# Patient Record
Sex: Male | Born: 1945 | Race: White | Hispanic: No | Smoking: Former smoker
Health system: Southern US, Community
[De-identification: ages and names within clinical notes are randomized; demographics above are authoritative.]

## PROBLEM LIST (undated history)

## (undated) DIAGNOSIS — I214 Non-ST elevation (NSTEMI) myocardial infarction: Secondary | ICD-10-CM

## (undated) DIAGNOSIS — M1990 Unspecified osteoarthritis, unspecified site: Secondary | ICD-10-CM

## (undated) DIAGNOSIS — Z95820 Peripheral vascular angioplasty status with implants and grafts: Secondary | ICD-10-CM

## (undated) DIAGNOSIS — G4730 Sleep apnea, unspecified: Secondary | ICD-10-CM

## (undated) DIAGNOSIS — G8929 Other chronic pain: Secondary | ICD-10-CM

## (undated) DIAGNOSIS — I739 Peripheral vascular disease, unspecified: Secondary | ICD-10-CM

## (undated) DIAGNOSIS — E785 Hyperlipidemia, unspecified: Secondary | ICD-10-CM

## (undated) DIAGNOSIS — I350 Nonrheumatic aortic (valve) stenosis: Secondary | ICD-10-CM

## (undated) DIAGNOSIS — F1021 Alcohol dependence, in remission: Secondary | ICD-10-CM

## (undated) DIAGNOSIS — M549 Dorsalgia, unspecified: Secondary | ICD-10-CM

## (undated) DIAGNOSIS — I519 Heart disease, unspecified: Secondary | ICD-10-CM

## (undated) DIAGNOSIS — F419 Anxiety disorder, unspecified: Secondary | ICD-10-CM

## (undated) DIAGNOSIS — B1910 Unspecified viral hepatitis B without hepatic coma: Secondary | ICD-10-CM

## (undated) DIAGNOSIS — H353130 Nonexudative age-related macular degeneration, bilateral, stage unspecified: Secondary | ICD-10-CM

## (undated) DIAGNOSIS — J189 Pneumonia, unspecified organism: Secondary | ICD-10-CM

## (undated) DIAGNOSIS — K219 Gastro-esophageal reflux disease without esophagitis: Secondary | ICD-10-CM

## (undated) DIAGNOSIS — I82409 Acute embolism and thrombosis of unspecified deep veins of unspecified lower extremity: Secondary | ICD-10-CM

## (undated) DIAGNOSIS — E1142 Type 2 diabetes mellitus with diabetic polyneuropathy: Secondary | ICD-10-CM

## (undated) DIAGNOSIS — I2510 Atherosclerotic heart disease of native coronary artery without angina pectoris: Secondary | ICD-10-CM

## (undated) DIAGNOSIS — J329 Chronic sinusitis, unspecified: Secondary | ICD-10-CM

## (undated) DIAGNOSIS — I209 Angina pectoris, unspecified: Secondary | ICD-10-CM

## (undated) DIAGNOSIS — I10 Essential (primary) hypertension: Secondary | ICD-10-CM

## (undated) DIAGNOSIS — I509 Heart failure, unspecified: Secondary | ICD-10-CM

## (undated) DIAGNOSIS — Z8711 Personal history of peptic ulcer disease: Secondary | ICD-10-CM

## (undated) DIAGNOSIS — Z8719 Personal history of other diseases of the digestive system: Secondary | ICD-10-CM

## (undated) DIAGNOSIS — I429 Cardiomyopathy, unspecified: Secondary | ICD-10-CM

## (undated) DIAGNOSIS — E119 Type 2 diabetes mellitus without complications: Secondary | ICD-10-CM

## (undated) HISTORY — PX: CORONARY ANGIOPLASTY WITH STENT PLACEMENT: SHX49

## (undated) HISTORY — PX: COLONOSCOPY W/ BIOPSIES AND POLYPECTOMY: SHX1376

## (undated) HISTORY — PX: JOINT REPLACEMENT: SHX530

## (undated) HISTORY — PX: TONSILLECTOMY: SUR1361

## (undated) HISTORY — PX: ANTERIOR CERVICAL DECOMP/DISCECTOMY FUSION: SHX1161

## (undated) HISTORY — PX: REVISION TOTAL HIP ARTHROPLASTY: SHX766

## (undated) HISTORY — PX: CAROTID ENDARTERECTOMY: SUR193

## (undated) HISTORY — PX: TOTAL HIP ARTHROPLASTY: SHX124

## (undated) HISTORY — PX: BACK SURGERY: SHX140

---

## 1998-07-03 ENCOUNTER — Encounter: Payer: Self-pay | Admitting: Internal Medicine

## 1998-07-03 ENCOUNTER — Ambulatory Visit (HOSPITAL_COMMUNITY): Admission: RE | Admit: 1998-07-03 | Discharge: 1998-07-03 | Payer: Self-pay | Admitting: Internal Medicine

## 1999-05-28 ENCOUNTER — Ambulatory Visit (HOSPITAL_COMMUNITY): Admission: RE | Admit: 1999-05-28 | Discharge: 1999-05-28 | Payer: Self-pay | Admitting: Internal Medicine

## 1999-05-28 ENCOUNTER — Encounter: Payer: Self-pay | Admitting: Internal Medicine

## 2000-07-28 ENCOUNTER — Encounter: Payer: Self-pay | Admitting: Internal Medicine

## 2000-07-28 ENCOUNTER — Ambulatory Visit (HOSPITAL_COMMUNITY): Admission: RE | Admit: 2000-07-28 | Discharge: 2000-07-28 | Payer: Self-pay | Admitting: Internal Medicine

## 2001-09-12 ENCOUNTER — Encounter: Payer: Self-pay | Admitting: Internal Medicine

## 2001-09-12 ENCOUNTER — Ambulatory Visit (HOSPITAL_COMMUNITY): Admission: RE | Admit: 2001-09-12 | Discharge: 2001-09-12 | Payer: Self-pay | Admitting: Internal Medicine

## 2002-11-21 ENCOUNTER — Ambulatory Visit (HOSPITAL_COMMUNITY): Admission: RE | Admit: 2002-11-21 | Discharge: 2002-11-21 | Payer: Self-pay | Admitting: Internal Medicine

## 2002-11-21 ENCOUNTER — Encounter: Payer: Self-pay | Admitting: Internal Medicine

## 2003-07-12 ENCOUNTER — Ambulatory Visit (HOSPITAL_COMMUNITY): Admission: RE | Admit: 2003-07-12 | Discharge: 2003-07-12 | Payer: Self-pay | Admitting: Internal Medicine

## 2003-11-25 ENCOUNTER — Ambulatory Visit (HOSPITAL_COMMUNITY): Admission: RE | Admit: 2003-11-25 | Discharge: 2003-11-25 | Payer: Self-pay | Admitting: Internal Medicine

## 2005-03-09 ENCOUNTER — Encounter: Admission: RE | Admit: 2005-03-09 | Discharge: 2005-04-25 | Payer: Self-pay | Admitting: Internal Medicine

## 2010-10-19 ENCOUNTER — Ambulatory Visit: Payer: Self-pay | Admitting: Internal Medicine

## 2012-03-26 HISTORY — PX: CARDIAC CATHETERIZATION: SHX172

## 2012-04-10 HISTORY — PX: CORONARY ARTERY BYPASS GRAFT: SHX141

## 2012-07-13 ENCOUNTER — Encounter (HOSPITAL_COMMUNITY)
Admission: RE | Admit: 2012-07-13 | Discharge: 2012-07-13 | Disposition: A | Discharge status: Home / Self Care | Payer: Non-veteran care | Source: Ambulatory Visit | Attending: Cardiology | Admitting: Cardiology

## 2012-07-13 DIAGNOSIS — Z5189 Encounter for other specified aftercare: Secondary | ICD-10-CM | POA: Insufficient documentation

## 2012-07-13 DIAGNOSIS — Z951 Presence of aortocoronary bypass graft: Secondary | ICD-10-CM | POA: Insufficient documentation

## 2012-07-13 DIAGNOSIS — I10 Essential (primary) hypertension: Secondary | ICD-10-CM | POA: Insufficient documentation

## 2012-07-13 DIAGNOSIS — I2510 Atherosclerotic heart disease of native coronary artery without angina pectoris: Secondary | ICD-10-CM | POA: Insufficient documentation

## 2012-07-13 NOTE — Progress Notes (Signed)
Cardiac Rehab Medication Review by a Pharmacist  Does the patient  feel that his/her medications are working for him/her?  yes  Has the patient been experiencing any side effects to the medications prescribed?  no  Does the patient measure his/her own blood pressure or blood glucose at home?  yes   Does the patient have any problems obtaining medications due to transportation or finances?   no  Understanding of regimen: fair Understanding of indications: fair Potential of compliance: good    Pharmacist comments: Pt is on an extreme number of herbal medications, spoke at length about trying to not double up on the same ingredient from different sources, Vitamin E was particularly focused on. His Rx medications he does take regularly but has a general poor understanding of why he takes things. He pretty much buys every bottle he can find that says it helps with energy, heart, or bowels    Drue Stager 07/13/2012 8:54 AM

## 2012-07-17 ENCOUNTER — Encounter (HOSPITAL_COMMUNITY): Payer: Non-veteran care

## 2012-07-17 ENCOUNTER — Encounter (HOSPITAL_COMMUNITY)
Admission: RE | Admit: 2012-07-17 | Discharge: 2012-07-17 | Disposition: A | Discharge status: Home / Self Care | Payer: Non-veteran care | Source: Ambulatory Visit | Attending: Cardiology | Admitting: Cardiology

## 2012-07-17 ENCOUNTER — Encounter (HOSPITAL_COMMUNITY): Payer: Self-pay

## 2012-07-17 HISTORY — DX: Atherosclerotic heart disease of native coronary artery without angina pectoris: I25.10

## 2012-07-17 LAB — GLUCOSE, CAPILLARY: Glucose-Capillary: 77 mg/dL (ref 70–99)

## 2012-07-17 NOTE — Progress Notes (Signed)
Nutrition Note Spoke with pt. Pre-exercise CBG 76 mg/dL. Pt reports he ate a pork chop sandwich at 10 am and a spoonful of peanut butter immediately before coming to exercise. Pt given ginger ale and a banana. Post-snack pt CBG 79 mg/dL. No recent A1c noted. Per medical record, "keep A1c less than 7.5 as able." Pt reports he was told his last A1c was "good" and pt does not recall the exact number. Pt states he checks his CBG's "only fasting CBG's once a week." Alternative pre-exercise snack choices discussed. If pre-exercise CBG's do not improve with pre-exercise alternatives, may consider recommending that pt hold Glipizide. Will fax Dr. Jules Husbands, pt's PCP, re: most recent A1c. Continue client-centered nutrition education by RD as part of interdisciplinary care.  Monitor and evaluate progress toward nutrition goal with team.  Mickle Plumb, M.Ed, RD, LDN, CDE 07/17/2012 3:04 PM

## 2012-07-19 ENCOUNTER — Encounter (HOSPITAL_COMMUNITY)
Admission: RE | Admit: 2012-07-19 | Discharge: 2012-07-19 | Disposition: A | Discharge status: Home / Self Care | Payer: Non-veteran care | Source: Ambulatory Visit | Attending: Cardiology | Admitting: Cardiology

## 2012-07-19 ENCOUNTER — Encounter (HOSPITAL_COMMUNITY): Payer: Non-veteran care

## 2012-07-19 LAB — GLUCOSE, CAPILLARY
Glucose-Capillary: 69 mg/dL — ABNORMAL LOW (ref 70–99)
Glucose-Capillary: 94 mg/dL (ref 70–99)

## 2012-07-19 NOTE — Progress Notes (Signed)
Nutrition Note Spoke with pt. Pt reports eating waffles with "the last little bit of regular syrup" this morning and a fish sandwich, fries, and a frosty from [employer] 30 min prior to exercise. Pre-exercise CBG 69 mg/dL. Pt instructed to hold Glipizide Thursday and Friday to see if pre-exercise CBG's improve. Pt encouraged to bring in his glucometer. Pt checks CBG's once a week. Pt instructed to check his fasting CBG Friday am. Written instructions given to pt by Deveron Furlong, RN. Pt expressed understanding of the information reviewed. Continue client-centered nutrition education by RD as part of interdisciplinary care.  Monitor and evaluate progress toward nutrition goal with team.  Mickle Plumb, M.Ed, RD, LDN, CDE 07/19/2012 3:50 PM

## 2012-07-21 ENCOUNTER — Encounter (HOSPITAL_COMMUNITY): Payer: Non-veteran care

## 2012-07-21 ENCOUNTER — Encounter (HOSPITAL_COMMUNITY)
Admission: RE | Admit: 2012-07-21 | Discharge: 2012-07-21 | Disposition: A | Discharge status: Home / Self Care | Payer: Non-veteran care | Source: Ambulatory Visit | Attending: Cardiology | Admitting: Cardiology

## 2012-07-21 LAB — GLUCOSE, CAPILLARY: Glucose-Capillary: 128 mg/dL — ABNORMAL HIGH (ref 70–99)

## 2012-07-21 NOTE — Progress Notes (Signed)
Pt started cardiac rehab today.  Pt tolerated light exercise without difficulty.  VSS, telemetry-NSR.  Reviewed quality of life with patient.  Pt reports he has shortness of  Breath and fatigue that interrupt his normal activities and don't allow him to participate in activiities he previously enjoyed.  Pt exercised today without difficulty.  Pt reassured, advised to allow for periods of rest with activities.  Pt  Verbalized understanding.  Will continue to monitor

## 2012-07-21 NOTE — Progress Notes (Signed)
Nutrition Note Spoke with pt. Pt pre-exercise CBG 128 mg/dL after holding Glipizide 07/20/12 and 07/21/12. Fasting CBG 105 mg/dL per pt's glucometer. Pt states he e-mailed Dr. Jules Husbands re: holding Glipizide. Pt to continue to hold Glipizide for now and will monitor CBG's and notify MD if CBG's remain WNL without Glipizide. Continue client-centered nutrition education by RD as part of interdisciplinary care.  Monitor and evaluate progress toward nutrition goal with team.  Mickle Plumb, M.Ed, RD, LDN, CDE 07/21/2012 1:35 PM

## 2012-07-24 ENCOUNTER — Encounter (HOSPITAL_COMMUNITY): Payer: Non-veteran care

## 2012-07-24 ENCOUNTER — Encounter (HOSPITAL_COMMUNITY)
Admission: RE | Admit: 2012-07-24 | Discharge: 2012-07-24 | Disposition: A | Discharge status: Home / Self Care | Payer: Non-veteran care | Source: Ambulatory Visit | Attending: Cardiology | Admitting: Cardiology

## 2012-07-24 LAB — GLUCOSE, CAPILLARY: Glucose-Capillary: 75 mg/dL (ref 70–99)

## 2012-07-24 NOTE — Progress Notes (Signed)
Pt arrived at cardiac rehab today with CBG-91. Pt states he ate grapefruit and cheerios at 10am today.  Nothing since.  Pt given peanut butter crackers and juice. Recheck 15 minutes later-75. Pt given banana.  Recheck 30 minutes later-112.  Pt exercised at 245pm class.  Recheck post exercise 91.  Pt instructed to discuss Metformin use with Dr. Jules Husbands.  Pt also instructed to eat high protein and carbohydrate meal prior to exercise and eat peanut butter crackers with juice just prior to exercise.  Pt verbalized understanding.

## 2012-07-26 ENCOUNTER — Encounter (HOSPITAL_COMMUNITY)
Admission: RE | Admit: 2012-07-26 | Discharge: 2012-07-26 | Disposition: A | Discharge status: Home / Self Care | Payer: Non-veteran care | Source: Ambulatory Visit | Attending: Cardiology | Admitting: Cardiology

## 2012-07-26 ENCOUNTER — Encounter (HOSPITAL_COMMUNITY): Payer: Non-veteran care

## 2012-07-26 DIAGNOSIS — I10 Essential (primary) hypertension: Secondary | ICD-10-CM | POA: Insufficient documentation

## 2012-07-26 DIAGNOSIS — Z951 Presence of aortocoronary bypass graft: Secondary | ICD-10-CM | POA: Insufficient documentation

## 2012-07-26 DIAGNOSIS — Z5189 Encounter for other specified aftercare: Secondary | ICD-10-CM | POA: Insufficient documentation

## 2012-07-26 DIAGNOSIS — I2510 Atherosclerotic heart disease of native coronary artery without angina pectoris: Secondary | ICD-10-CM | POA: Insufficient documentation

## 2012-07-26 LAB — GLUCOSE, CAPILLARY: Glucose-Capillary: 122 mg/dL — ABNORMAL HIGH (ref 70–99)

## 2012-07-28 ENCOUNTER — Encounter (HOSPITAL_COMMUNITY): Payer: Non-veteran care

## 2012-07-28 ENCOUNTER — Encounter (HOSPITAL_COMMUNITY)
Admission: RE | Admit: 2012-07-28 | Discharge: 2012-07-28 | Disposition: A | Discharge status: Home / Self Care | Payer: Non-veteran care | Source: Ambulatory Visit | Attending: Cardiology | Admitting: Cardiology

## 2012-07-28 LAB — GLUCOSE, CAPILLARY: Glucose-Capillary: 109 mg/dL — ABNORMAL HIGH (ref 70–99)

## 2012-07-31 ENCOUNTER — Encounter (HOSPITAL_COMMUNITY)
Admission: RE | Admit: 2012-07-31 | Discharge: 2012-07-31 | Disposition: A | Discharge status: Home / Self Care | Payer: Non-veteran care | Source: Ambulatory Visit | Attending: Cardiology | Admitting: Cardiology

## 2012-07-31 ENCOUNTER — Encounter (HOSPITAL_COMMUNITY): Payer: Non-veteran care

## 2012-07-31 LAB — GLUCOSE, CAPILLARY
Glucose-Capillary: 120 mg/dL — ABNORMAL HIGH (ref 70–99)
Glucose-Capillary: 115 mg/dL — ABNORMAL HIGH (ref 70–99)

## 2012-08-02 ENCOUNTER — Encounter (HOSPITAL_COMMUNITY)
Admission: RE | Admit: 2012-08-02 | Discharge: 2012-08-02 | Disposition: A | Discharge status: Home / Self Care | Payer: Non-veteran care | Source: Ambulatory Visit | Attending: Cardiology | Admitting: Cardiology

## 2012-08-02 ENCOUNTER — Encounter (HOSPITAL_COMMUNITY): Payer: Non-veteran care

## 2012-08-04 ENCOUNTER — Encounter (HOSPITAL_COMMUNITY): Payer: Non-veteran care

## 2012-08-04 ENCOUNTER — Encounter (HOSPITAL_COMMUNITY)
Admission: RE | Admit: 2012-08-04 | Discharge: 2012-08-04 | Disposition: A | Discharge status: Home / Self Care | Payer: Non-veteran care | Source: Ambulatory Visit | Attending: Cardiology | Admitting: Cardiology

## 2012-08-07 ENCOUNTER — Encounter (HOSPITAL_COMMUNITY): Payer: Non-veteran care

## 2012-08-07 ENCOUNTER — Encounter (HOSPITAL_COMMUNITY)
Admission: RE | Admit: 2012-08-07 | Discharge: 2012-08-07 | Disposition: A | Discharge status: Home / Self Care | Payer: Non-veteran care | Source: Ambulatory Visit | Attending: Cardiology | Admitting: Cardiology

## 2012-08-07 NOTE — Progress Notes (Signed)
Luke Harvey 67 y.o. male Nutrition Note Spoke with pt. Nutrition Plan and Nutrition Survey goals reviewed with pt. Pt is following Step 1  of the Therapeutic Lifestyle Changes diet. Pt eats out "up to 1 meal a day because I don't cook." Ways to eat out heart healthier discussed and pt encouraged to attend the Nutrition II class. Pt states he wants to lose wt. Pt has not currently been trying to lose wt. Wt loss tips reviewed.  Pt is diabetic. No recent A1c noted. Pt checks fasting CBG's daily. Per pt, CBG's 90's-124 mg/dL. Pt's Glipizide has been discontinued. Pt takes Metformin at night on the days he comes to rehab and BID on non-rehab days. Pt does not currently exercise on non-rehab days. Pt encouraged to exercise most days of the week. This Clinical research associate went over Diabetes Education test results. Pt states he has previously attended DM education classes. Pt able to recall most of the hypoglycemia treatment protocol. Hypoglycemia treatment reviewed with pt. Pt expressed understanding of the information reviewed. Pt aware of nutrition education classes offered and plans on attending nutrition classes.  Nutrition Diagnosis   Food-and nutrition-related knowledge deficit related to lack of exposure to information as related to diagnosis of: ? CVD ? DM   Overweight related to excessive energy intake as evidenced by a BMI of 27.0  Nutrition RX/ Estimated Daily Nutrition Needs for: wt loss  1350-1850 Kcal, 35-50 gm fat, 9-12 gm sat fat, 1.3-1.9 gm trans-fat, <1500 mg sodium, 175-250 gm CHO   Nutrition Intervention   Pt's individual nutrition plan reviewed with pt.   Benefits of adopting Therapeutic Lifestyle Changes discussed when Medficts reviewed.   Pt to attend the Portion Distortion class   Pt to attend the  ? Nutrition I class                          ? Nutrition II class         ? Diabetes Blitz class - met 07/25/12        ? Diabetes Q & A class - met 08/04/12   Continue client-centered nutrition  education by RD, as part of interdisciplinary care. Goal(s)   Pt to identify and limit food sources of saturated fat, trans fat, and cholesterol   Pt to identify food quantities necessary to achieve: ? wt loss to a goal wt of 164-182 lb (74.8-83.0 kg) at graduation from cardiac rehab.    Pt to describe the benefit of including fruits, vegetables, whole grains, and low-fat dairy products in a heart healthy meal plan. Monitor and Evaluate progress toward nutrition goal with team. Nutrition Risk: Change to Moderate   Mickle Plumb, M.Ed, RD, LDN, CDE 08/07/2012 2:09 PM

## 2012-08-09 ENCOUNTER — Encounter (HOSPITAL_COMMUNITY): Payer: Non-veteran care

## 2012-08-09 ENCOUNTER — Encounter (HOSPITAL_COMMUNITY)
Admission: RE | Admit: 2012-08-09 | Discharge: 2012-08-09 | Disposition: A | Discharge status: Home / Self Care | Payer: Non-veteran care | Source: Ambulatory Visit | Attending: Cardiology | Admitting: Cardiology

## 2012-08-11 ENCOUNTER — Encounter (HOSPITAL_COMMUNITY): Payer: Non-veteran care

## 2012-08-11 ENCOUNTER — Encounter (HOSPITAL_COMMUNITY)
Admission: RE | Admit: 2012-08-11 | Discharge: 2012-08-11 | Disposition: A | Discharge status: Home / Self Care | Payer: Non-veteran care | Source: Ambulatory Visit | Attending: Cardiology | Admitting: Cardiology

## 2012-08-14 ENCOUNTER — Encounter (HOSPITAL_COMMUNITY)
Admission: RE | Admit: 2012-08-14 | Discharge: 2012-08-14 | Disposition: A | Discharge status: Home / Self Care | Payer: Non-veteran care | Source: Ambulatory Visit | Attending: Cardiology | Admitting: Cardiology

## 2012-08-14 ENCOUNTER — Encounter (HOSPITAL_COMMUNITY): Payer: Non-veteran care

## 2012-08-16 ENCOUNTER — Encounter (HOSPITAL_COMMUNITY): Payer: Non-veteran care

## 2012-08-16 ENCOUNTER — Encounter (HOSPITAL_COMMUNITY)
Admission: RE | Admit: 2012-08-16 | Discharge: 2012-08-16 | Disposition: A | Discharge status: Home / Self Care | Payer: Non-veteran care | Source: Ambulatory Visit | Attending: Cardiology | Admitting: Cardiology

## 2012-08-18 ENCOUNTER — Encounter (HOSPITAL_COMMUNITY): Payer: Non-veteran care

## 2012-08-18 ENCOUNTER — Encounter (HOSPITAL_COMMUNITY)
Admission: RE | Admit: 2012-08-18 | Discharge: 2012-08-18 | Disposition: A | Discharge status: Home / Self Care | Payer: Non-veteran care | Source: Ambulatory Visit | Attending: Cardiology | Admitting: Cardiology

## 2012-08-18 LAB — GLUCOSE, CAPILLARY: Glucose-Capillary: 87 mg/dL (ref 70–99)

## 2012-08-21 ENCOUNTER — Encounter (HOSPITAL_COMMUNITY): Payer: Non-veteran care

## 2012-08-21 ENCOUNTER — Encounter (HOSPITAL_COMMUNITY)
Admission: RE | Admit: 2012-08-21 | Discharge: 2012-08-21 | Disposition: A | Discharge status: Home / Self Care | Payer: Non-veteran care | Source: Ambulatory Visit | Attending: Cardiology | Admitting: Cardiology

## 2012-08-21 NOTE — Progress Notes (Signed)
Nutrition Consult Consult for pt unsure of protein sources. Spoke with pt. Pt reports he ate a sweet potato and 2 clementines on Friday before exercise and his CBG was 250 mg/dL and dropped to 80 mg/dL. Pt encouraged by Deveron Furlong, RN to add protein to pre-exercise meal and pt was unsure what protein to add. Per pt, "I had a [employer] fried fish sandwich the other day and you told me that was not a good choice." Heart healthy protein sources reviewed with pt. Pt given a handout re: dietary sources of protein. Pt encouraged by this writer to review his Eating Out Heart Healthy book for suggestions on healthy food choices while eating out. Pt expressed understanding of information given. Continue client-centered nutrition education by RD as part of interdisciplinary care.  Monitor and evaluate progress toward nutrition goal with team.  Mickle Plumb, M.Ed, RD, LDN, CDE 08/21/2012 3:08 PM

## 2012-08-23 ENCOUNTER — Encounter (HOSPITAL_COMMUNITY): Payer: Non-veteran care

## 2012-08-23 ENCOUNTER — Encounter (HOSPITAL_COMMUNITY)
Admission: RE | Admit: 2012-08-23 | Discharge: 2012-08-23 | Disposition: A | Discharge status: Home / Self Care | Payer: Non-veteran care | Source: Ambulatory Visit | Attending: Cardiology | Admitting: Cardiology

## 2012-08-25 ENCOUNTER — Encounter (HOSPITAL_COMMUNITY)
Admission: RE | Admit: 2012-08-25 | Discharge: 2012-08-25 | Disposition: A | Discharge status: Home / Self Care | Payer: Non-veteran care | Source: Ambulatory Visit | Attending: Cardiology | Admitting: Cardiology

## 2012-08-25 ENCOUNTER — Encounter (HOSPITAL_COMMUNITY): Payer: Non-veteran care

## 2012-08-25 DIAGNOSIS — I2510 Atherosclerotic heart disease of native coronary artery without angina pectoris: Secondary | ICD-10-CM | POA: Insufficient documentation

## 2012-08-25 DIAGNOSIS — I10 Essential (primary) hypertension: Secondary | ICD-10-CM | POA: Insufficient documentation

## 2012-08-25 DIAGNOSIS — Z951 Presence of aortocoronary bypass graft: Secondary | ICD-10-CM | POA: Insufficient documentation

## 2012-08-25 DIAGNOSIS — Z5189 Encounter for other specified aftercare: Secondary | ICD-10-CM | POA: Insufficient documentation

## 2012-08-25 LAB — GLUCOSE, CAPILLARY
Glucose-Capillary: 76 mg/dL (ref 70–99)
Glucose-Capillary: 98 mg/dL (ref 70–99)

## 2012-08-28 ENCOUNTER — Encounter (HOSPITAL_COMMUNITY)
Admission: RE | Admit: 2012-08-28 | Discharge: 2012-08-28 | Disposition: A | Discharge status: Home / Self Care | Payer: Non-veteran care | Source: Ambulatory Visit | Attending: Cardiology | Admitting: Cardiology

## 2012-08-28 ENCOUNTER — Encounter (HOSPITAL_COMMUNITY): Payer: Non-veteran care

## 2012-08-29 LAB — GLUCOSE, CAPILLARY: Glucose-Capillary: 92 mg/dL (ref 70–99)

## 2012-08-30 ENCOUNTER — Encounter (HOSPITAL_COMMUNITY)
Admission: RE | Admit: 2012-08-30 | Discharge: 2012-08-30 | Disposition: A | Discharge status: Home / Self Care | Payer: Non-veteran care | Source: Ambulatory Visit | Attending: Cardiology | Admitting: Cardiology

## 2012-08-30 ENCOUNTER — Encounter (HOSPITAL_COMMUNITY): Payer: Non-veteran care

## 2012-08-30 LAB — GLUCOSE, CAPILLARY
Glucose-Capillary: 109 mg/dL — ABNORMAL HIGH (ref 70–99)
Glucose-Capillary: 120 mg/dL — ABNORMAL HIGH (ref 70–99)

## 2012-09-01 ENCOUNTER — Encounter (HOSPITAL_COMMUNITY): Payer: Non-veteran care

## 2012-09-01 ENCOUNTER — Encounter (HOSPITAL_COMMUNITY)
Admission: RE | Admit: 2012-09-01 | Discharge: 2012-09-01 | Disposition: A | Discharge status: Home / Self Care | Payer: Non-veteran care | Source: Ambulatory Visit | Attending: Cardiology | Admitting: Cardiology

## 2012-09-01 LAB — GLUCOSE, CAPILLARY: Glucose-Capillary: 104 mg/dL — ABNORMAL HIGH (ref 70–99)

## 2012-09-04 ENCOUNTER — Encounter (HOSPITAL_COMMUNITY): Payer: Non-veteran care

## 2012-09-04 ENCOUNTER — Encounter (HOSPITAL_COMMUNITY)
Admission: RE | Admit: 2012-09-04 | Discharge: 2012-09-04 | Disposition: A | Discharge status: Home / Self Care | Payer: Non-veteran care | Source: Ambulatory Visit | Attending: Cardiology | Admitting: Cardiology

## 2012-09-06 ENCOUNTER — Encounter (HOSPITAL_COMMUNITY)
Admission: RE | Admit: 2012-09-06 | Discharge: 2012-09-06 | Disposition: A | Discharge status: Home / Self Care | Payer: Non-veteran care | Source: Ambulatory Visit | Attending: Cardiology | Admitting: Cardiology

## 2012-09-06 ENCOUNTER — Encounter (HOSPITAL_COMMUNITY): Payer: Non-veteran care

## 2012-09-08 ENCOUNTER — Encounter (HOSPITAL_COMMUNITY)
Admission: RE | Admit: 2012-09-08 | Discharge: 2012-09-08 | Disposition: A | Discharge status: Home / Self Care | Payer: Non-veteran care | Source: Ambulatory Visit | Attending: Cardiology | Admitting: Cardiology

## 2012-09-08 ENCOUNTER — Encounter (HOSPITAL_COMMUNITY): Payer: Non-veteran care

## 2012-09-11 ENCOUNTER — Encounter (HOSPITAL_COMMUNITY): Payer: Non-veteran care

## 2012-09-11 ENCOUNTER — Encounter (HOSPITAL_COMMUNITY)
Admission: RE | Admit: 2012-09-11 | Discharge: 2012-09-11 | Disposition: A | Discharge status: Home / Self Care | Payer: Non-veteran care | Source: Ambulatory Visit | Attending: Cardiology | Admitting: Cardiology

## 2012-09-13 ENCOUNTER — Encounter (HOSPITAL_COMMUNITY): Payer: Non-veteran care

## 2012-09-13 ENCOUNTER — Encounter (HOSPITAL_COMMUNITY)
Admission: RE | Admit: 2012-09-13 | Discharge: 2012-09-13 | Disposition: A | Discharge status: Home / Self Care | Payer: Non-veteran care | Source: Ambulatory Visit | Attending: Cardiology | Admitting: Cardiology

## 2012-09-15 ENCOUNTER — Encounter (HOSPITAL_COMMUNITY): Payer: Non-veteran care

## 2012-09-15 ENCOUNTER — Encounter (HOSPITAL_COMMUNITY)
Admission: RE | Admit: 2012-09-15 | Discharge: 2012-09-15 | Disposition: A | Discharge status: Home / Self Care | Payer: Non-veteran care | Source: Ambulatory Visit | Attending: Cardiology | Admitting: Cardiology

## 2012-09-15 NOTE — Progress Notes (Signed)
Pt arrived at cardiac rehab reporting symptoms of sore throat, nasal congestion, productive cough with yellow phelgm.  Pt denies fever, chills, aches.  Pt states he has started amoxicillin for rosacea.  Pt advised to notify PCP if UR symptoms persist or worsen.  Understanding verbalized

## 2012-09-18 ENCOUNTER — Encounter (HOSPITAL_COMMUNITY): Payer: Non-veteran care

## 2012-09-20 ENCOUNTER — Encounter (HOSPITAL_COMMUNITY): Payer: Non-veteran care

## 2012-09-22 ENCOUNTER — Encounter (HOSPITAL_COMMUNITY): Payer: Non-veteran care

## 2012-09-25 ENCOUNTER — Encounter (HOSPITAL_COMMUNITY): Payer: Non-veteran care

## 2012-09-27 ENCOUNTER — Encounter (HOSPITAL_COMMUNITY)
Admission: RE | Admit: 2012-09-27 | Discharge: 2012-09-27 | Disposition: A | Discharge status: Home / Self Care | Payer: Non-veteran care | Source: Ambulatory Visit | Attending: Cardiology | Admitting: Cardiology

## 2012-09-27 ENCOUNTER — Encounter (HOSPITAL_COMMUNITY): Payer: Non-veteran care

## 2012-09-27 DIAGNOSIS — I2510 Atherosclerotic heart disease of native coronary artery without angina pectoris: Secondary | ICD-10-CM | POA: Insufficient documentation

## 2012-09-27 DIAGNOSIS — Z5189 Encounter for other specified aftercare: Secondary | ICD-10-CM | POA: Insufficient documentation

## 2012-09-27 DIAGNOSIS — Z951 Presence of aortocoronary bypass graft: Secondary | ICD-10-CM | POA: Insufficient documentation

## 2012-09-27 DIAGNOSIS — I10 Essential (primary) hypertension: Secondary | ICD-10-CM | POA: Insufficient documentation

## 2012-09-27 NOTE — Progress Notes (Signed)
Pt returned to cardiac rehab today after extended absence for cold symptoms and vacation. Pt reports his chest congestion persists, however is improving.  Pt states he has discussed with his PCP who has recommended OTC treatment.  Unfortuantely, pt reports he did was not able to be active during his recent beach vacation, instead stayed in the room relaxing most days.

## 2012-09-29 ENCOUNTER — Encounter (HOSPITAL_COMMUNITY): Payer: Non-veteran care

## 2012-09-29 ENCOUNTER — Encounter (HOSPITAL_COMMUNITY)
Admission: RE | Admit: 2012-09-29 | Discharge: 2012-09-29 | Disposition: A | Discharge status: Home / Self Care | Payer: Non-veteran care | Source: Ambulatory Visit | Attending: Cardiology | Admitting: Cardiology

## 2012-10-02 ENCOUNTER — Encounter (HOSPITAL_COMMUNITY): Payer: Non-veteran care

## 2012-10-02 ENCOUNTER — Encounter (HOSPITAL_COMMUNITY)
Admission: RE | Admit: 2012-10-02 | Discharge: 2012-10-02 | Disposition: A | Discharge status: Home / Self Care | Payer: Non-veteran care | Source: Ambulatory Visit | Attending: Cardiology | Admitting: Cardiology

## 2012-10-02 LAB — GLUCOSE, CAPILLARY: Glucose-Capillary: 108 mg/dL — ABNORMAL HIGH (ref 70–99)

## 2012-10-04 ENCOUNTER — Encounter (HOSPITAL_COMMUNITY): Payer: Non-veteran care

## 2012-10-04 ENCOUNTER — Encounter (HOSPITAL_COMMUNITY)
Admission: RE | Admit: 2012-10-04 | Discharge: 2012-10-04 | Disposition: A | Discharge status: Home / Self Care | Payer: Non-veteran care | Source: Ambulatory Visit | Attending: Cardiology | Admitting: Cardiology

## 2012-10-04 LAB — GLUCOSE, CAPILLARY: Glucose-Capillary: 110 mg/dL — ABNORMAL HIGH (ref 70–99)

## 2012-10-06 ENCOUNTER — Encounter (HOSPITAL_COMMUNITY): Payer: Non-veteran care

## 2012-10-06 ENCOUNTER — Encounter (HOSPITAL_COMMUNITY)
Admission: RE | Admit: 2012-10-06 | Discharge: 2012-10-06 | Disposition: A | Discharge status: Home / Self Care | Payer: Non-veteran care | Source: Ambulatory Visit | Attending: Cardiology | Admitting: Cardiology

## 2012-10-06 LAB — GLUCOSE, CAPILLARY: Glucose-Capillary: 99 mg/dL (ref 70–99)

## 2012-10-09 ENCOUNTER — Encounter (HOSPITAL_COMMUNITY)
Admission: RE | Admit: 2012-10-09 | Discharge: 2012-10-09 | Disposition: A | Discharge status: Home / Self Care | Payer: Non-veteran care | Source: Ambulatory Visit | Attending: Cardiology | Admitting: Cardiology

## 2012-10-09 ENCOUNTER — Encounter (HOSPITAL_COMMUNITY): Payer: Non-veteran care

## 2012-10-11 ENCOUNTER — Encounter (HOSPITAL_COMMUNITY): Payer: Non-veteran care

## 2012-10-11 ENCOUNTER — Encounter (HOSPITAL_COMMUNITY)
Admission: RE | Admit: 2012-10-11 | Discharge: 2012-10-11 | Disposition: A | Discharge status: Home / Self Care | Payer: Non-veteran care | Source: Ambulatory Visit | Attending: Cardiology | Admitting: Cardiology

## 2012-10-13 ENCOUNTER — Encounter (HOSPITAL_COMMUNITY): Payer: Non-veteran care

## 2012-10-16 ENCOUNTER — Encounter (HOSPITAL_COMMUNITY): Payer: Non-veteran care

## 2012-10-18 ENCOUNTER — Encounter (HOSPITAL_COMMUNITY): Payer: Non-veteran care

## 2012-10-18 ENCOUNTER — Encounter (HOSPITAL_COMMUNITY)
Admission: RE | Admit: 2012-10-18 | Discharge: 2012-10-18 | Disposition: A | Discharge status: Home / Self Care | Payer: Non-veteran care | Source: Ambulatory Visit | Attending: Cardiology | Admitting: Cardiology

## 2012-10-20 ENCOUNTER — Encounter (HOSPITAL_COMMUNITY): Payer: Non-veteran care

## 2012-10-23 ENCOUNTER — Encounter (HOSPITAL_COMMUNITY)
Admission: RE | Admit: 2012-10-23 | Discharge: 2012-10-23 | Disposition: A | Discharge status: Home / Self Care | Payer: Non-veteran care | Source: Ambulatory Visit | Attending: Cardiology | Admitting: Cardiology

## 2012-10-25 ENCOUNTER — Encounter (HOSPITAL_COMMUNITY)
Admission: RE | Admit: 2012-10-25 | Discharge: 2012-10-25 | Disposition: A | Discharge status: Home / Self Care | Payer: Non-veteran care | Source: Ambulatory Visit | Attending: Cardiology | Admitting: Cardiology

## 2012-10-25 DIAGNOSIS — Z951 Presence of aortocoronary bypass graft: Secondary | ICD-10-CM | POA: Insufficient documentation

## 2012-10-25 DIAGNOSIS — I10 Essential (primary) hypertension: Secondary | ICD-10-CM | POA: Insufficient documentation

## 2012-10-25 DIAGNOSIS — I2510 Atherosclerotic heart disease of native coronary artery without angina pectoris: Secondary | ICD-10-CM | POA: Insufficient documentation

## 2012-10-25 DIAGNOSIS — Z5189 Encounter for other specified aftercare: Secondary | ICD-10-CM | POA: Insufficient documentation

## 2012-10-25 NOTE — Progress Notes (Signed)
Pt arrived at cardiac rehab reporting he had carotid CT scan done to evaluate carotid stenosis.  pc to [facility] to obtain results.  No answer with Dr. Hope Pigeon Vascular Surgery case manager.  Left message to return call. Pt informed unable to speak with staff.

## 2012-10-30 ENCOUNTER — Encounter (HOSPITAL_COMMUNITY)
Admission: RE | Admit: 2012-10-30 | Discharge: 2012-10-30 | Disposition: A | Discharge status: Home / Self Care | Payer: Non-veteran care | Source: Ambulatory Visit | Attending: Cardiology | Admitting: Cardiology

## 2012-11-01 ENCOUNTER — Encounter (HOSPITAL_COMMUNITY)
Admission: RE | Admit: 2012-11-01 | Discharge: 2012-11-01 | Disposition: A | Discharge status: Home / Self Care | Payer: Non-veteran care | Source: Ambulatory Visit | Attending: Cardiology | Admitting: Cardiology

## 2012-11-01 ENCOUNTER — Encounter (HOSPITAL_COMMUNITY): Payer: Self-pay

## 2012-11-01 NOTE — Progress Notes (Signed)
Pt graduated from cardiac rehab program today.  Medication list reconciled.  PHQ9 score- 5 . Pt reports mild depressive symptoms which are chronic.  Pt feels it is r/t his medication regimen.   Pt has made significant lifestyle changes and should be commended for his success. Pt plans to continue exercise in cardiac maintenance program or silver sneakers.  Pt has not made a definite decision as he is scheduled for carotid endartectomy next week.  Pt will contact us if he would like to proceed with maintenance.

## 2013-08-20 ENCOUNTER — Telehealth: Payer: Self-pay | Admitting: Neurology

## 2013-08-20 NOTE — Telephone Encounter (Signed)
Note sent on wrong patient.

## 2013-08-20 NOTE — Telephone Encounter (Signed)
[facility] and needs to talk to someone about [facility] at [phone]

## 2016-06-08 ENCOUNTER — Inpatient Hospital Stay (HOSPITAL_COMMUNITY)
Admission: EM | Admit: 2016-06-08 | Discharge: 2016-06-09 | DRG: 247 | Disposition: A | Discharge status: Home / Self Care | Payer: Non-veteran care | Attending: Interventional Cardiology | Admitting: Interventional Cardiology

## 2016-06-08 ENCOUNTER — Encounter (HOSPITAL_COMMUNITY): Payer: Self-pay | Admitting: Emergency Medicine

## 2016-06-08 ENCOUNTER — Encounter (HOSPITAL_COMMUNITY)
Admission: EM | Disposition: A | Discharge status: Home / Self Care | Payer: Self-pay | Source: Home / Self Care | Attending: Interventional Cardiology

## 2016-06-08 ENCOUNTER — Emergency Department (HOSPITAL_COMMUNITY): Payer: Non-veteran care

## 2016-06-08 DIAGNOSIS — Y831 Surgical operation with implant of artificial internal device as the cause of abnormal reaction of the patient, or of later complication, without mention of misadventure at the time of the procedure: Secondary | ICD-10-CM | POA: Diagnosis present

## 2016-06-08 DIAGNOSIS — Z79899 Other long term (current) drug therapy: Secondary | ICD-10-CM | POA: Diagnosis not present

## 2016-06-08 DIAGNOSIS — Z7982 Long term (current) use of aspirin: Secondary | ICD-10-CM

## 2016-06-08 DIAGNOSIS — Z955 Presence of coronary angioplasty implant and graft: Secondary | ICD-10-CM

## 2016-06-08 DIAGNOSIS — I739 Peripheral vascular disease, unspecified: Secondary | ICD-10-CM | POA: Insufficient documentation

## 2016-06-08 DIAGNOSIS — Z951 Presence of aortocoronary bypass graft: Secondary | ICD-10-CM

## 2016-06-08 DIAGNOSIS — I119 Hypertensive heart disease without heart failure: Secondary | ICD-10-CM

## 2016-06-08 DIAGNOSIS — I10 Essential (primary) hypertension: Secondary | ICD-10-CM | POA: Diagnosis present

## 2016-06-08 DIAGNOSIS — I214 Non-ST elevation (NSTEMI) myocardial infarction: Secondary | ICD-10-CM | POA: Diagnosis present

## 2016-06-08 DIAGNOSIS — I25110 Atherosclerotic heart disease of native coronary artery with unstable angina pectoris: Secondary | ICD-10-CM | POA: Diagnosis not present

## 2016-06-08 DIAGNOSIS — E782 Mixed hyperlipidemia: Secondary | ICD-10-CM | POA: Diagnosis not present

## 2016-06-08 DIAGNOSIS — Z87891 Personal history of nicotine dependence: Secondary | ICD-10-CM

## 2016-06-08 DIAGNOSIS — E1159 Type 2 diabetes mellitus with other circulatory complications: Secondary | ICD-10-CM | POA: Diagnosis not present

## 2016-06-08 DIAGNOSIS — M549 Dorsalgia, unspecified: Secondary | ICD-10-CM | POA: Diagnosis present

## 2016-06-08 DIAGNOSIS — E118 Type 2 diabetes mellitus with unspecified complications: Secondary | ICD-10-CM

## 2016-06-08 DIAGNOSIS — Z6830 Body mass index (BMI) 30.0-30.9, adult: Secondary | ICD-10-CM | POA: Diagnosis not present

## 2016-06-08 DIAGNOSIS — E119 Type 2 diabetes mellitus without complications: Secondary | ICD-10-CM

## 2016-06-08 DIAGNOSIS — I25119 Atherosclerotic heart disease of native coronary artery with unspecified angina pectoris: Secondary | ICD-10-CM

## 2016-06-08 DIAGNOSIS — Z7984 Long term (current) use of oral hypoglycemic drugs: Secondary | ICD-10-CM

## 2016-06-08 DIAGNOSIS — E1151 Type 2 diabetes mellitus with diabetic peripheral angiopathy without gangrene: Secondary | ICD-10-CM | POA: Diagnosis present

## 2016-06-08 DIAGNOSIS — I519 Heart disease, unspecified: Secondary | ICD-10-CM | POA: Clinically undetermined

## 2016-06-08 DIAGNOSIS — E039 Hypothyroidism, unspecified: Secondary | ICD-10-CM | POA: Diagnosis present

## 2016-06-08 DIAGNOSIS — G8929 Other chronic pain: Secondary | ICD-10-CM | POA: Diagnosis present

## 2016-06-08 DIAGNOSIS — I2510 Atherosclerotic heart disease of native coronary artery without angina pectoris: Secondary | ICD-10-CM | POA: Diagnosis present

## 2016-06-08 DIAGNOSIS — M545 Low back pain: Secondary | ICD-10-CM

## 2016-06-08 DIAGNOSIS — E669 Obesity, unspecified: Secondary | ICD-10-CM | POA: Diagnosis present

## 2016-06-08 DIAGNOSIS — E785 Hyperlipidemia, unspecified: Secondary | ICD-10-CM | POA: Diagnosis present

## 2016-06-08 DIAGNOSIS — T82855A Stenosis of coronary artery stent, initial encounter: Secondary | ICD-10-CM | POA: Diagnosis present

## 2016-06-08 DIAGNOSIS — Z95820 Peripheral vascular angioplasty status with implants and grafts: Secondary | ICD-10-CM

## 2016-06-08 DIAGNOSIS — Z96642 Presence of left artificial hip joint: Secondary | ICD-10-CM | POA: Diagnosis present

## 2016-06-08 HISTORY — DX: Personal history of other diseases of the digestive system: Z87.19

## 2016-06-08 HISTORY — DX: Peripheral vascular angioplasty status with implants and grafts: Z95.820

## 2016-06-08 HISTORY — DX: Type 2 diabetes mellitus without complications: E11.9

## 2016-06-08 HISTORY — DX: Pneumonia, unspecified organism: J18.9

## 2016-06-08 HISTORY — DX: Heart disease, unspecified: I51.9

## 2016-06-08 HISTORY — DX: Hyperlipidemia, unspecified: E78.5

## 2016-06-08 HISTORY — DX: Personal history of peptic ulcer disease: Z87.11

## 2016-06-08 HISTORY — DX: Unspecified osteoarthritis, unspecified site: M19.90

## 2016-06-08 HISTORY — DX: Essential (primary) hypertension: I10

## 2016-06-08 HISTORY — DX: Non-ST elevation (NSTEMI) myocardial infarction: I21.4

## 2016-06-08 HISTORY — DX: Heart failure, unspecified: I50.9

## 2016-06-08 HISTORY — DX: Gastro-esophageal reflux disease without esophagitis: K21.9

## 2016-06-08 HISTORY — DX: Other chronic pain: G89.29

## 2016-06-08 HISTORY — DX: Acute embolism and thrombosis of unspecified deep veins of unspecified lower extremity: I82.409

## 2016-06-08 HISTORY — DX: Peripheral vascular disease, unspecified: I73.9

## 2016-06-08 HISTORY — DX: Dorsalgia, unspecified: M54.9

## 2016-06-08 HISTORY — PX: LEFT HEART CATH AND CORONARY ANGIOGRAPHY: CATH118249

## 2016-06-08 HISTORY — DX: Anxiety disorder, unspecified: F41.9

## 2016-06-08 HISTORY — DX: Nonexudative age-related macular degeneration, bilateral, stage unspecified: H35.3130

## 2016-06-08 HISTORY — DX: Sleep apnea, unspecified: G47.30

## 2016-06-08 HISTORY — PX: CORONARY STENT INTERVENTION: CATH118234

## 2016-06-08 HISTORY — DX: Angina pectoris, unspecified: I20.9

## 2016-06-08 HISTORY — DX: Chronic sinusitis, unspecified: J32.9

## 2016-06-08 HISTORY — DX: Type 2 diabetes mellitus with diabetic polyneuropathy: E11.42

## 2016-06-08 HISTORY — DX: Unspecified viral hepatitis B without hepatic coma: B19.10

## 2016-06-08 HISTORY — DX: Alcohol dependence, in remission: F10.21

## 2016-06-08 LAB — CBC
HCT: 37.3 % — ABNORMAL LOW (ref 39.0–52.0)
Hemoglobin: 13.1 g/dL (ref 13.0–17.0)
MCH: 29.7 pg (ref 26.0–34.0)
MCHC: 35.1 g/dL (ref 30.0–36.0)
MCV: 84.6 fL (ref 78.0–100.0)
PLATELETS: 204 10*3/uL (ref 150–400)
RBC: 4.41 MIL/uL (ref 4.22–5.81)
RDW: 11.9 % (ref 11.5–15.5)
WBC: 6.7 10*3/uL (ref 4.0–10.5)

## 2016-06-08 LAB — BASIC METABOLIC PANEL
Anion gap: 11 (ref 5–15)
BUN: 11 mg/dL (ref 6–20)
CALCIUM: 8.8 mg/dL — AB (ref 8.9–10.3)
CHLORIDE: 100 mmol/L — AB (ref 101–111)
CO2: 23 mmol/L (ref 22–32)
CREATININE: 0.95 mg/dL (ref 0.61–1.24)
GFR calc Af Amer: >60 mL/min (ref >60)
GFR calc non Af Amer: >60 mL/min (ref >60)
GLUCOSE: 186 mg/dL — AB (ref 65–99)
Potassium: 4.5 mmol/L (ref 3.5–5.1)
Sodium: 134 mmol/L — ABNORMAL LOW (ref 135–145)

## 2016-06-08 LAB — GLUCOSE, CAPILLARY
GLUCOSE-CAPILLARY: 150 mg/dL — AB (ref 65–99)
Glucose-Capillary: 150 mg/dL — ABNORMAL HIGH (ref 65–99)
Glucose-Capillary: 165 mg/dL — ABNORMAL HIGH (ref 65–99)

## 2016-06-08 LAB — TROPONIN I
TROPONIN I: 5.73 ng/mL — AB (ref <0.03)
Troponin I: 4.06 ng/mL (ref <0.03)

## 2016-06-08 LAB — I-STAT TROPONIN, ED: TROPONIN I, POC: 1.27 ng/mL — AB (ref 0.00–0.08)

## 2016-06-08 LAB — TSH: TSH: 5.025 u[IU]/mL — AB (ref 0.350–4.500)

## 2016-06-08 SURGERY — LEFT HEART CATH AND CORONARY ANGIOGRAPHY
Anesthesia: LOCAL

## 2016-06-08 MED ORDER — TICAGRELOR 90 MG PO TABS
90.0000 mg | ORAL_TABLET | Freq: Two times a day (BID) | ORAL | Status: DC
  Administered 2016-06-08 – 2016-06-09 (×2): 90 mg via ORAL
  Filled 2016-06-08 (×2): qty 1

## 2016-06-08 MED ORDER — LIDOCAINE HCL (PF) 1 % IJ SOLN
INTRAMUSCULAR | Status: AC
  Filled 2016-06-08: qty 30

## 2016-06-08 MED ORDER — LABETALOL HCL 5 MG/ML IV SOLN
10.0000 mg | INTRAVENOUS | Status: AC | PRN
  Filled 2016-06-08: qty 4

## 2016-06-08 MED ORDER — IOPAMIDOL (ISOVUE-370) INJECTION 76%
INTRAVENOUS | Status: AC
  Filled 2016-06-08: qty 100

## 2016-06-08 MED ORDER — SODIUM CHLORIDE 0.9% FLUSH: 3.0000 mL | Freq: Two times a day (BID) | INTRAVENOUS | Status: DC

## 2016-06-08 MED ORDER — MIDAZOLAM HCL 2 MG/2ML IJ SOLN
INTRAMUSCULAR | Status: AC
  Filled 2016-06-08: qty 2

## 2016-06-08 MED ORDER — SODIUM CHLORIDE 0.9% FLUSH: 3.0000 mL | INTRAVENOUS | Status: DC | PRN

## 2016-06-08 MED ORDER — BIVALIRUDIN BOLUS VIA INFUSION - CUPID
INTRAVENOUS | Status: DC | PRN
  Administered 2016-06-08: 64.65 mg via INTRAVENOUS

## 2016-06-08 MED ORDER — DOXYCYCLINE HYCLATE 100 MG PO TABS
100.0000 mg | ORAL_TABLET | Freq: Two times a day (BID) | ORAL | Status: DC
  Administered 2016-06-08 – 2016-06-09 (×3): 100 mg via ORAL
  Filled 2016-06-08 (×3): qty 1

## 2016-06-08 MED ORDER — ANGIOPLASTY BOOK
Freq: Once | Status: AC
  Administered 2016-06-08: 21:00:00
  Filled 2016-06-08: qty 1

## 2016-06-08 MED ORDER — NITROGLYCERIN IN D5W 200-5 MCG/ML-% IV SOLN
INTRAVENOUS | Status: DC | PRN
  Administered 2016-06-08: 10 ug/min via INTRAVENOUS

## 2016-06-08 MED ORDER — SODIUM CHLORIDE 0.9 % IV SOLN
INTRAVENOUS | Status: DC | PRN
  Administered 2016-06-08: 100 mL/h via INTRAVENOUS

## 2016-06-08 MED ORDER — TICAGRELOR 90 MG PO TABS
ORAL_TABLET | ORAL | Status: DC | PRN
  Administered 2016-06-08: 180 mg via ORAL

## 2016-06-08 MED ORDER — HEPARIN (PORCINE) IN NACL 2-0.9 UNIT/ML-% IJ SOLN
INTRAMUSCULAR | Status: DC | PRN
  Administered 2016-06-08: 1500 mL

## 2016-06-08 MED ORDER — ALPRAZOLAM 0.25 MG PO TABS
0.2500 mg | ORAL_TABLET | Freq: Two times a day (BID) | ORAL | Status: DC | PRN
  Administered 2016-06-08: 0.25 mg via ORAL
  Filled 2016-06-08: qty 1

## 2016-06-08 MED ORDER — CITALOPRAM HYDROBROMIDE 20 MG PO TABS
20.0000 mg | ORAL_TABLET | Freq: Every day | ORAL | Status: DC
  Administered 2016-06-08 – 2016-06-09 (×2): 20 mg via ORAL
  Filled 2016-06-08 (×2): qty 1

## 2016-06-08 MED ORDER — MORPHINE SULFATE (PF) 4 MG/ML IV SOLN
1.0000 mg | INTRAVENOUS | Status: DC | PRN
  Administered 2016-06-08: 14:00:00 2 mg via INTRAVENOUS
  Filled 2016-06-08 (×2): qty 1

## 2016-06-08 MED ORDER — ROSUVASTATIN CALCIUM 20 MG PO TABS
20.0000 mg | ORAL_TABLET | Freq: Every day | ORAL | Status: DC
  Administered 2016-06-08 – 2016-06-09 (×2): 20 mg via ORAL
  Filled 2016-06-08 (×2): qty 1

## 2016-06-08 MED ORDER — TICAGRELOR 90 MG PO TABS
ORAL_TABLET | ORAL | Status: AC
  Filled 2016-06-08: qty 2

## 2016-06-08 MED ORDER — HEART ATTACK BOUNCING BOOK
Freq: Once | Status: AC
  Administered 2016-06-08: 21:00:00
  Filled 2016-06-08: qty 1

## 2016-06-08 MED ORDER — NITROGLYCERIN IN D5W 200-5 MCG/ML-% IV SOLN: 2.0000 ug/min | INTRAVENOUS | Status: DC

## 2016-06-08 MED ORDER — NITROGLYCERIN 0.4 MG SL SUBL: 0.4000 mg | SUBLINGUAL_TABLET | SUBLINGUAL | Status: DC | PRN

## 2016-06-08 MED ORDER — SODIUM CHLORIDE 0.9 % IV SOLN
INTRAVENOUS | Status: DC
  Administered 2016-06-08: 19:00:00 via INTRAVENOUS

## 2016-06-08 MED ORDER — IOPAMIDOL (ISOVUE-370) INJECTION 76%
INTRAVENOUS | Status: DC | PRN
  Administered 2016-06-08: 185 mL via INTRA_ARTERIAL

## 2016-06-08 MED ORDER — HEPARIN (PORCINE) IN NACL 100-0.45 UNIT/ML-% IJ SOLN
1100.0000 [IU]/h | INTRAMUSCULAR | Status: DC
  Administered 2016-06-08: 1100 [IU]/h via INTRAVENOUS
  Filled 2016-06-08: qty 250

## 2016-06-08 MED ORDER — FENTANYL CITRATE (PF) 100 MCG/2ML IJ SOLN
INTRAMUSCULAR | Status: AC
  Filled 2016-06-08: qty 2

## 2016-06-08 MED ORDER — SODIUM CHLORIDE 0.9 % IV SOLN
INTRAVENOUS | Status: DC
  Administered 2016-06-08: 13:00:00 via INTRAVENOUS

## 2016-06-08 MED ORDER — SODIUM CHLORIDE 0.9 % IV SOLN: 250.0000 mL | INTRAVENOUS | Status: DC | PRN

## 2016-06-08 MED ORDER — SODIUM CHLORIDE 0.9% FLUSH
3.0000 mL | Freq: Two times a day (BID) | INTRAVENOUS | Status: DC
Start: 2016-06-08 — End: 2016-06-09

## 2016-06-08 MED ORDER — METOPROLOL TARTRATE 25 MG PO TABS
25.0000 mg | ORAL_TABLET | Freq: Two times a day (BID) | ORAL | Status: DC
  Administered 2016-06-08 – 2016-06-09 (×3): 25 mg via ORAL
  Filled 2016-06-08 (×3): qty 1

## 2016-06-08 MED ORDER — HEPARIN BOLUS VIA INFUSION
3000.0000 [IU] | Freq: Once | INTRAVENOUS | Status: AC
  Administered 2016-06-08: 3000 [IU] via INTRAVENOUS
  Filled 2016-06-08: qty 3000

## 2016-06-08 MED ORDER — INSULIN ASPART 100 UNIT/ML ~~LOC~~ SOLN
0.0000 [IU] | Freq: Three times a day (TID) | SUBCUTANEOUS | Status: DC
  Administered 2016-06-08 – 2016-06-09 (×2): 3 [IU] via SUBCUTANEOUS

## 2016-06-08 MED ORDER — SODIUM CHLORIDE 0.9 % IV SOLN
INTRAVENOUS | Status: DC | PRN
  Administered 2016-06-08: 1.75 mg/kg/h via INTRAVENOUS

## 2016-06-08 MED ORDER — CILOSTAZOL 100 MG PO TABS
100.0000 mg | ORAL_TABLET | Freq: Every day | ORAL | Status: DC
  Administered 2016-06-08 – 2016-06-09 (×2): 100 mg via ORAL
  Filled 2016-06-08 (×2): qty 1

## 2016-06-08 MED ORDER — NITROGLYCERIN IN D5W 200-5 MCG/ML-% IV SOLN
INTRAVENOUS | Status: AC
  Filled 2016-06-08: qty 250

## 2016-06-08 MED ORDER — ASPIRIN EC 81 MG PO TBEC: 81.0000 mg | DELAYED_RELEASE_TABLET | Freq: Every day | ORAL | Status: DC

## 2016-06-08 MED ORDER — ONDANSETRON HCL 4 MG/2ML IJ SOLN: 4.0000 mg | Freq: Four times a day (QID) | INTRAMUSCULAR | Status: DC | PRN

## 2016-06-08 MED ORDER — HEPARIN (PORCINE) IN NACL 2-0.9 UNIT/ML-% IJ SOLN
INTRAMUSCULAR | Status: AC
  Filled 2016-06-08: qty 1500

## 2016-06-08 MED ORDER — FENTANYL CITRATE (PF) 100 MCG/2ML IJ SOLN
INTRAMUSCULAR | Status: DC | PRN
  Administered 2016-06-08 (×2): 25 ug via INTRAVENOUS

## 2016-06-08 MED ORDER — NITROGLYCERIN 1 MG/10 ML FOR IR/CATH LAB
INTRA_ARTERIAL | Status: DC | PRN
  Administered 2016-06-08 (×2): 200 ug via INTRACORONARY

## 2016-06-08 MED ORDER — BIVALIRUDIN 250 MG IV SOLR
INTRAVENOUS | Status: AC
  Filled 2016-06-08: qty 250

## 2016-06-08 MED ORDER — ACETAMINOPHEN 325 MG PO TABS: 650.0000 mg | ORAL_TABLET | ORAL | Status: DC | PRN

## 2016-06-08 MED ORDER — HYDRALAZINE HCL 20 MG/ML IJ SOLN
5.0000 mg | INTRAMUSCULAR | Status: AC | PRN
  Administered 2016-06-08: 5 mg via INTRAVENOUS
  Filled 2016-06-08: qty 1

## 2016-06-08 MED ORDER — ASPIRIN 81 MG PO CHEW
81.0000 mg | CHEWABLE_TABLET | Freq: Every day | ORAL | Status: DC
  Administered 2016-06-09: 10:00:00 81 mg via ORAL
  Filled 2016-06-08: qty 1

## 2016-06-08 MED ORDER — LIDOCAINE HCL (PF) 1 % IJ SOLN
INTRAMUSCULAR | Status: DC | PRN
  Administered 2016-06-08: 20 mL via SUBCUTANEOUS

## 2016-06-08 MED ORDER — LEVOTHYROXINE SODIUM 50 MCG PO TABS
50.0000 ug | ORAL_TABLET | Freq: Every day | ORAL | Status: DC
  Administered 2016-06-09: 06:00:00 50 ug via ORAL
  Filled 2016-06-08: qty 1

## 2016-06-08 MED ORDER — MIDAZOLAM HCL 2 MG/2ML IJ SOLN
INTRAMUSCULAR | Status: DC | PRN
  Administered 2016-06-08: 2 mg via INTRAVENOUS
  Administered 2016-06-08: 1 mg via INTRAVENOUS

## 2016-06-08 MED ORDER — GLIPIZIDE 5 MG PO TABS
2.5000 mg | ORAL_TABLET | Freq: Every day | ORAL | Status: DC
  Administered 2016-06-09: 2.5 mg via ORAL
  Filled 2016-06-08: qty 1

## 2016-06-08 MED ORDER — NITROGLYCERIN 1 MG/10 ML FOR IR/CATH LAB
INTRA_ARTERIAL | Status: AC
  Filled 2016-06-08: qty 10

## 2016-06-08 SURGICAL SUPPLY — 18 items
BALLN EMERGE MR 2.5X12 (BALLOONS) ×2
BALLN ~~LOC~~ EMERGE MR 2.75X12 (BALLOONS) ×2
BALLOON EMERGE MR 2.5X12 (BALLOONS) IMPLANT
BALLOON ~~LOC~~ EMERGE MR 2.75X12 (BALLOONS) IMPLANT
CATH INFINITI 5FR MULTPACK ANG (CATHETERS) ×1 IMPLANT
CATH VISTA GUIDE 6FR JR4 SH (CATHETERS) ×1 IMPLANT
GUIDE CATH RUNWAY 6FR FR4 (CATHETERS) ×1 IMPLANT
KIT ENCORE 26 ADVANTAGE (KITS) ×1 IMPLANT
KIT HEART LEFT (KITS) ×2 IMPLANT
PACK CARDIAC CATHETERIZATION (CUSTOM PROCEDURE TRAY) ×2 IMPLANT
SHEATH PINNACLE 5F 10CM (SHEATH) ×1 IMPLANT
SHEATH PINNACLE 6F 10CM (SHEATH) ×1 IMPLANT
STENT SYNERGY DES 2.5X16 (Permanent Stent) ×1 IMPLANT
SYR MEDRAD MARK V 150ML (SYRINGE) ×2 IMPLANT
TRANSDUCER W/STOPCOCK (MISCELLANEOUS) ×2 IMPLANT
TUBING CIL FLEX 10 FLL-RA (TUBING) ×2 IMPLANT
WIRE ASAHI PROWATER 180CM (WIRE) ×1 IMPLANT
WIRE EMERALD 3MM-J .035X150CM (WIRE) ×1 IMPLANT

## 2016-06-08 NOTE — H&P (Addendum)
Patient ID: Luke DredgeLarry W Cake MRN: [id number], DOB/AGE: Sep 23, 1945   Admit date: 06/08/2016  Primary Physician: Pcp Not In System Primary Cardiologist: Dr. Annia FriendlyBeard [facility] Reason for admission: NSTEMI  Pt. Profile:  Luke Harvey is a 71 y.o. male with a history of CAD s/p CABG x2V (2014) with subsequent PCI (2015), PAD with LE claudication, HTN, uncontrolled DMT2, HLD, hypothyroidism and previous tobacco abuse who presented to [facility] early this AM with chest pain and found to have NSTEMI. Cardiology asked for admission.   He has a history of CAD s/p CABG and PCI done in [place] [employer]. He is now followed by [facility]. he also has known peripheral vascular disease but never has had any interventions. He takes Cilostazol. He has lower extremity claudication which limits his activity.   He was in his usual state of health until yesterday morning when he started to notice chest fullness/tightness. He also feels like he is bloating in his belly. He took nitroglycerin x3 with some relief. The last nitroglycerin he took caused him to break out into a sweat and feel a little lightheaded. Prompted him to come to the ER for evaluation. No associated nausea or dyspnea. Not necessarily worse with exertion. No lower extremity edema, orthopnea or PND. No syncope. No palpitations.                        He is currently feeling okay but still have chest discomfort.   Problem List  Past Medical History:  Diagnosis Date  . Coronary artery disease   . Diabetes mellitus without complication (HCC)   . HTN (hypertension)     Past Surgical History:  Procedure Laterality Date  . CARDIAC CATHETERIZATION  03/2012  . CORONARY ARTERY BYPASS GRAFT  04/10/2012  . TONSILLECTOMY    . TOTAL HIP ARTHROPLASTY Left      Allergies  Allergies  Allergen Reactions  . Tetracyclines & Related Hives  . Lipitor [Atorvastatin] Other (See Comments)    Sore muscles     Home  Medications  Prior to Admission medications   Medication Sig Start Date End Date Taking? Authorizing Provider  ALPRAZolam Prudy Feeler(XANAX) 0.5 MG tablet Take 0.25 mg by mouth 2 (two) times daily as needed for sleep.    Yes Historical Provider, MD  aspirin EC 81 MG tablet Take 81 mg by mouth daily.   Yes Historical Provider, MD  cilostazol (PLETAL) 100 MG tablet Take 100 mg by mouth daily.   Yes Historical Provider, MD  citalopram (CELEXA) 40 MG tablet Take 20 mg by mouth daily.    Yes Historical Provider, MD  doxycycline (VIBRA-TABS) 100 MG tablet Take 100 mg by mouth 2 (two) times daily.   Yes Historical Provider, MD  glipiZIDE (GLUCOTROL) 5 MG tablet Take 2.5 mg by mouth daily before breakfast.   Yes Historical Provider, MD  levothyroxine (SYNTHROID, LEVOTHROID) 50 MCG tablet Take 50 mcg by mouth daily before breakfast.   Yes Historical Provider, MD  metFORMIN (GLUCOPHAGE) 500 MG tablet Take 500 mg by mouth 2 (two) times daily with a meal.   Yes Historical Provider, MD  metoprolol (LOPRESSOR) 50 MG tablet Take 25 mg by mouth 2 (two) times daily.    Yes Historical Provider, MD  Multiple Vitamins-Minerals (PRESERVISION AREDS PO) Take 1 tablet by mouth daily.   Yes Historical Provider, MD  nitroGLYCERIN (NITROSTAT) 0.4 MG SL tablet Place 0.4 mg under the tongue every 5 (five)  minutes as needed for chest pain.   Yes Historical Provider, MD  rosuvastatin (CRESTOR) 40 MG tablet Take 20 mg by mouth daily.   Yes Historical Provider, MD    Family History  Family History  Problem Relation Age of Onset  . Cancer Mother    Family Status  Relation Status  . Mother Deceased  . Father Deceased  . Sister Alive     Social History  Social History   Social History  . Marital status: Divorced    Spouse name: N/A  . Number of children: N/A  . Years of education: N/A   Occupational History  . Not on file.   Social History Main Topics  . Smoking status: Former Smoker    Packs/day: 1.00    Years:  40.00    Quit date: 07/17/2008  . Smokeless tobacco: Never Used  . 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 Drug use: Unknown  . Sexual activity: Not on file   Other Topics Concern  . Not on file   Social History Narrative  . No narrative on file     Review of Systems General:  No chills, fever, night sweats or weight changes.  Cardiovascular:  ++ chest pain, ++ dyspnea on exertion, NO LE edema, orthopnea, palpitations, paroxysmal nocturnal dyspnea. Dermatological: No rash, lesions/masses Respiratory: No cough, dyspnea Urologic: No hematuria, dysuria Abdominal:   No nausea, vomiting, diarrhea, bright red blood per rectum, melena, or hematemesis Neurologic:  No visual changes, wkns, changes in mental status. All other systems reviewed and are otherwise negative except as noted above.  Physical Exam  Blood pressure 159/85, pulse 79, temperature 97.7 F (36.5 C), temperature source Oral, resp. rate 15, height 5\' 6"  (1.676 m), weight 190 lb (86.2 kg), SpO2 98 %.  General: Pleasant, NAD, obese Psych: Normal affect. Neuro: Alert and oriented X 3. Moves all extremities spontaneously. HEENT: Normal  Neck: Supple without bruits or JVD. Lungs:  Resp regular and unlabored, CTA. Heart: RRR no s3, s4, or murmurs. Abdomen: Soft, non-tender, non-distended, BS + x 4.  Extremities: No clubbing, cyanosis or edema. DP/PT/Radials 2+ and equal bilaterally.  Labs  No results for input(s): CKTOTAL, CKMB, TROPONINI in the last 72 hours. Lab Results  Component Value Date   WBC 6.7 06/08/2016   HGB 13.1 06/08/2016   HCT 37.3 (L) 06/08/2016   MCV 84.6 06/08/2016   PLT 204 06/08/2016     Recent Labs Lab 06/08/16 0434  NA 134*  K 4.5  CL 100*  CO2 23  BUN 11  CREATININE 0.95  CALCIUM 8.8*  GLUCOSE 186*   No results found for: CHOL, HDL, LDLCALC, TRIG No results found for: DDIMER   Radiology/Studies  Dg Chest 2 View  Result  Date: 06/08/2016 CLINICAL DATA:  Chest pain starting yesterday morning. Shortness of breath. History of bypass graft. EXAM: CHEST  2 VIEW COMPARISON:  06/27/2010 FINDINGS: Postoperative changes in the mediastinum and lower cervical spine. Normal heart size and pulmonary vascularity. Diffuse interstitial pattern to the lungs new since previous study, suggesting interstitial pneumonitis or edema. No focal consolidation or volume loss. No blunting of costophrenic angles. No pneumothorax. IMPRESSION: Interstitial pattern to the lungs suggesting edema or interstitial pneumonitis. Electronically Signed   By: Burman Nieves M.D.   On: 06/08/2016 05:09    ECG  NSR HR 74, possible anteroseptal Q waves  ASSESSMENT AND PLAN  SPYRIDON HORNSTEIN is a  71 y.o. male with a history of CAD s/p CABG x2V (2014) with subsequent PCI (2015), PAD with LE claudication, HTN, uncontrolled DMT2, HLD, hypothyroidism and previous tobacco abuse who presented to [facility] early this AM with chest pain and found to have NSTEMI. Cardiology asked for admission.   NSTEMI: started on heparin gtt. POC troponin 1.27. ECG with no acute ST/TW changes. Continue to cycle enzymes. Likely for heart cath later today.   CAD S/P CABG x2V and PCI: Continue aspirin, beta blocker and statin.  HTN: BP has been mildly elevated this AM. Continue to monitor  HLD: Continue Crestor 20 mg  DMT2: hold metformin for heart catheterization, start SSI   Obesity: Body mass index is 30.67 kg/m.   Signed, Cline Crock, PA-C 06/08/2016, 7:07 AM  Pager [phone]  I have examined the patient and reviewed assessment and plan and discussed with patient.  Agree with above as stated. RRR.  No wheezing.  No acute changes on ECG. Patient with elevated troponin and continued minimal residual chest discomfort. Better than what brought him to the ER.  Diaphoresis has stopped.  Morphine and IV NTG written for.  Plan for cath later today- will expedite if meds do not  work to relieve chest pain.    He states that his last cath in [place] was done through the left wrist.  No bleeding concerns.  He is hoping to have some injections or back surgery due to chronic back pain.  WOUld consider use of Synergy DES so that antiplatelet therapy duration can be decreased.   DM management as above. BP should improve with IV NTG.   Critical care time 45 minutes  Lance Muss

## 2016-06-08 NOTE — Interval H&P Note (Signed)
History and Physical Interval Note:  06/08/2016 9:30 AM  Luke Harvey  has presented today for surgery, with the diagnosis of n stemi  The various methods of treatment have been discussed with the patient and family. After consideration of risks, benefits and other options for treatment, the patient has consented to  Procedure(s): Left Heart Cath and Coronary Angiography (N/A) as a surgical intervention .  The patient's history has been reviewed, patient examined, no change in status, stable for surgery.  I have reviewed the patient's chart and labs.  Questions were answered to the patient's satisfaction.     Nicki Guadalajarahomas Kelly

## 2016-06-08 NOTE — Progress Notes (Signed)
CRITICAL VALUE ALERT  Critical value received:  Trop 4.06   Date of notification:  06/08/16  Time of notification:  1606  Critical value read back:Yes.    Nurse who received alert:  Elige Radoneleisha Martin  MD notified (1st page):  No response  Time of first page:  1630  MD notified (2nd page):  Time of second page:1703  Responding MD:  Hardin NegusBrittnay Simmons, PA  Time MD responded:  [phone]

## 2016-06-08 NOTE — Interval H&P Note (Signed)
Cath Lab Visit (complete for each Cath Lab visit)  Clinical Evaluation Leading to the Procedure:   ACS: No.  Non-ACS:    Anginal Classification: CCS IV  Anti-ischemic medical therapy: Minimal Therapy (1 class of medications)  Non-Invasive Test Results: No non-invasive testing performed  Prior CABG: Previous CABG      History and Physical Interval Note:  06/08/2016 9:31 AM  Luke Harvey  has presented today for surgery, with the diagnosis of n stemi  The various methods of treatment have been discussed with the patient and family. After consideration of risks, benefits and other options for treatment, the patient has consented to  Procedure(s): Left Heart Cath and Coronary Angiography (N/A) as a surgical intervention .  The patient's history has been reviewed, patient examined, no change in status, stable for surgery.  I have reviewed the patient's chart and labs.  Questions were answered to the patient's satisfaction.     Luke Harvey

## 2016-06-08 NOTE — Progress Notes (Signed)
ANTICOAGULATION CONSULT NOTE - Initial Consult  Pharmacy Consult for heparin Indication: chest pain/ACS  Allergies  Allergen Reactions  . Tetracyclines & Related Hives  . Lipitor [Atorvastatin] Other (See Comments)    Sore muscles    Patient Measurements: Height: 5\' 6"  (167.6 cm) Weight: 190 lb (86.2 kg) IBW/kg (Calculated) : 63.8 Heparin Dosing Weight: 80kg  Vital Signs: Temp: 97.7 F (36.5 C) (02/13 0431) Temp Source: Oral (02/13 0431) BP: 149/91 (02/13 0431) Pulse Rate: 73 (02/13 0431)  Labs:  Recent Labs  06/08/16 0434  HGB 13.1  HCT 37.3*  PLT 204    CrCl cannot be calculated (No order found.).   Medical History: Past Medical History:  Diagnosis Date  . Coronary artery disease   . Diabetes mellitus without complication [facility](HCC)     Assessment: 71yo male with known h/o CAD c/o CP since yesterday, troponin elevated, to begin heparin.  Goal of Therapy:  Heparin level 0.3-0.7 units/ml Monitor platelets by anticoagulation protocol: Yes   Plan:  Will give heparin 3000 units IV bolus x1 followed by gtt at 1100 units/hr and monitor heparin levels and CBC.  Vernard GamblesVeronda Creola Krotz, PharmD, BCPS  06/08/2016,4:58 AM

## 2016-06-08 NOTE — ED Triage Notes (Signed)
Pt arrived via EMS from home with c/o CP that started yesterday morning. Received 324 ASA and 1 nitro en route.

## 2016-06-08 NOTE — Care Management Note (Addendum)
Case Management Note  Patient Details  Name: Luke DredgeLarry W Harvey MRN: [id number] Date of Birth: Aug 10, 1945  Subjective/Objective:    S/p stent intervention, NCM gave patient the 30 day coupon for brilinta, he is a Cytogeneticistveteran, [facility], PCP is Dr. Jules Husbandsyer.  Patient is indep pta, his daughter will be transporting him tomorrow to home. And [facility] [address]  NCM will cont to follow for dc needs.     2/15 1550 Letha Capeeborah Rosita Guzzetta RN, BSN - NCM faxed dc information to [facility]yer [facility] [phone].             Action/Plan:   Expected Discharge Date:                  Expected Discharge Plan:  Home/Self Care  In-House Referral:     Discharge planning Services  CM Consult, Medication Assistance  Post Acute Care Choice:    Choice offered to:     DME Arranged:    DME Agency:     HH Arranged:    HH Agency:     Status of Service:  In process, will continue to follow  If discussed at Long Length of Stay Meetings, dates discussed:    Additional Comments:  Leone Havenaylor, Kimberlee Shoun Clinton, RN 06/08/2016, 5:05 PM

## 2016-06-08 NOTE — ED Provider Notes (Signed)
[facility]-EMERGENCY DEPT Provider Note   CSN: [id number] Arrival date & time: 06/08/16  0418     History   Chief Complaint Chief Complaint  Patient presents with  . Chest Pain    HPI Luke Harvey is a 71 y.o. male with a past history of coronary artery disease status post CABG, diabetes, hypertension, hyperlipidemia, presenting today with chest pain. Patient states this started yesterday after walking around on the track. He went to rest and took a nitroglycerin which helped with his pain. It subsequently came back today and has been present intermittently all day. He took several nitros and eventually became cold and clammy with sweating. Did not show stopped helping with the pain so he called EMS. He was given a full dose aspirin by the paramedics. Currently his pain is better but still present. There are no further complaints.  10 Systems reviewed and are negative for acute change except as noted in the HPI.    HPI  Past Medical History:  Diagnosis Date  . Coronary artery disease   . Diabetes mellitus without complication (HCC)     There are no active problems to display for this patient.   Past Surgical History:  Procedure Laterality Date  . CARDIAC CATHETERIZATION  03/2012  . CORONARY ARTERY BYPASS GRAFT  04/10/2012  . TONSILLECTOMY    . TOTAL HIP ARTHROPLASTY Left        Home Medications    Prior to Admission medications   Medication Sig Start Date End Date Taking? Authorizing Provider  Alpha Lipoic Acid 200 MG CAPS Take 200 mg by mouth daily. PRN    Historical Provider, MD  ALPRAZolam Prudy Feeler) 0.5 MG tablet Take 0.5 mg by mouth 2 (two) times daily as needed for sleep.    Historical Provider, MD  Ascorbic Acid (VITAMIN C) 1000 MG tablet Take 1,000 mg by mouth daily. PRN    Historical Provider, MD  aspirin 325 MG EC tablet Take 325 mg by mouth at bedtime.    Historical Provider, MD  Cholecalciferol (VITAMIN D) 2000 UNITS CAPS Take 1 capsule by mouth daily. PRN     Historical Provider, MD  CHROMIUM-CINNAMON PO Take 1 tablet by mouth daily. PRN    Historical Provider, MD  citalopram (CELEXA) 40 MG tablet Take 40 mg by mouth daily. Take 1/2 tab qhs    Historical Provider, MD  Coenzyme Q10 (COQ10) 200 MG CAPS Take 2 capsules by mouth daily. PRN    Historical Provider, MD  Cyanocobalamin (B-12) 2500 MCG TABS Take 1 tablet by mouth daily.    Historical Provider, MD  dicyclomine (BENTYL) 10 MG capsule Take 10 mg by mouth 4 (four) times daily -  before meals and at bedtime.    Historical Provider, MD  diphenhydrAMINE (BENADRYL) 25 MG tablet Take 50 mg by mouth at bedtime as needed for sleep.    Historical Provider, MD  glucosamine-chondroitin 500-400 MG tablet Take 1 tablet by mouth 2 (two) times daily.     Historical Provider, MD  Magnesium 400 MG CAPS Take 1 tablet by mouth daily. PRN    Historical Provider, MD  metFORMIN (GLUCOPHAGE) 500 MG tablet Take 500 mg by mouth 2 (two) times daily with a meal.    Historical Provider, MD  metoprolol (LOPRESSOR) 50 MG tablet Take 50 mg by mouth 2 (two) times daily.    Historical Provider, MD  Milk Thistle 1000 MG CAPS Take 1 tablet by mouth daily. PRN    Historical Provider, MD  Misc Natural Products (PROSTATE THERAPY COMPLEX PO) Take 1 capsule by mouth daily.    Historical Provider, MD  Misc Natural Products (SUPER GREENS PO) Take 1 tablet by mouth.    Historical Provider, MD  multivitamin-lutein [facility]) CAPS Take 1 capsule by mouth daily.    Historical Provider, MD  Potassium Gluconate 595 MG CAPS Take 1 capsule by mouth daily. PRN    Historical Provider, MD  pravastatin (PRAVACHOL) 20 MG tablet Take 20 mg by mouth at bedtime.    Historical Provider, MD  Probiotic Product (PROBIOTIC PEARLS ADVANTAGE PO) Take 1 capsule by mouth daily.    Historical Provider, MD  Resveratrol 250 MG CAPS Take 1 capsule by mouth daily. PRN    Historical Provider, MD  Selenium 200 MCG TABS Take 1 tablet by mouth daily. PRN     Historical Provider, MD    Family History Family History  Problem Relation Age of Onset  . Cancer Mother     Social History Social History  Substance Use Topics  . Smoking status: Former Smoker    Packs/day: 1.00    Years: 40.00    Quit date: 07/17/2008  . Smokeless tobacco: Never Used  . 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 Vital signs are normal. He appears well-developed and well-nourished.  Non-toxic appearance. He does not appear ill. No distress.  HENT:  Head: Normocephalic and atraumatic.  Nose: Nose normal.  Mouth/Throat: Oropharynx is clear and moist. No oropharyngeal exudate.  Eyes: Conjunctivae and EOM are normal. Pupils are equal, round, and reactive to light. No scleral icterus.  Neck: Normal range of motion. Neck supple. No tracheal deviation, no edema, no erythema and normal range of motion present. No thyroid mass and no thyromegaly present.  Cardiovascular: Normal rate, regular rhythm, S1 normal, S2 normal, normal heart sounds, intact distal pulses and normal pulses.  Exam reveals no gallop and no friction rub.   No murmur heard. Pulmonary/Chest: Effort normal and breath sounds normal. No respiratory distress. He has no wheezes. He has no rhonchi. He has no rales.  Abdominal: Soft. Normal appearance and bowel sounds are normal. He exhibits no distension, no ascites and no mass. There is no hepatosplenomegaly. There is no tenderness. There is no rebound, no guarding and no CVA tenderness.  Musculoskeletal: Normal range of  motion. He exhibits no edema or tenderness.  Lymphadenopathy:    He has no cervical adenopathy.  Neurological: He is alert and oriented to person, place, and time. He has normal strength. No cranial nerve deficit or sensory deficit.  Skin: Skin is warm, dry and intact. No petechiae and no rash noted. He is not diaphoretic. No erythema. No pallor.  Nursing note and vitals reviewed.    ED Treatments / Results  Labs (all labs ordered are listed, but only abnormal results are displayed) Labs Reviewed  BASIC METABOLIC PANEL - Abnormal; Notable for the following:  Result Value   Sodium 134 (*)    Chloride 100 (*)    Glucose, Bld 186 (*)    Calcium 8.8 (*)    All other components within normal limits  CBC - Abnormal; Notable for the following:    HCT 37.3 (*)    All other components within normal limits  I-STAT TROPOININ, ED - Abnormal; Notable for the following:    Troponin i, poc 1.27 (*)    All other components within normal limits  HEPARIN LEVEL (UNFRACTIONATED)    EKG  EKG Interpretation  Date/Time:  Tuesday June 08 2016 04:22:45 EST Ventricular Rate:  74 PR Interval:    QRS Duration: 98 QT Interval:  404 QTC Calculation: 449 R Axis:   51 Text Interpretation:  Sinus rhythm Probable left atrial enlargement Anteroseptal infarct, age indeterminate No old tracing to compare Confirmed by Erroll LunaOni, Dyllan Kats Ayokunle [phone]([id number]) on 06/08/2016 5:09:24 AM       Radiology No results found.  Procedures Procedures (including critical care time)  Medications Ordered in ED Medications  nitroGLYCERIN (NITROSTAT) SL tablet 0.4 mg (not administered)  heparin bolus via infusion 3,000 Units (not administered)  heparin ADULT infusion 100 units/mL (25000 units/29050mL sodium chloride 0.45%) (not administered)     Initial Impression / Assessment and Plan / ED Course  I have reviewed the triage vital signs and the nursing notes.  Pertinent labs & imaging results that were available  during my care of the patient were reviewed by me and considered in my medical decision making (see chart for details).     Patient presents emergency department for chest pain. His history is very concerning for ACS. Troponin is 1.27, consistent with an STEMI. I spoke with cardiology on-call who will accept the patient for further care. They recommended for heparin drip which was ordered. We'll continue to closely monitor this patient.   CRITICAL CARE Performed by: Tomasita CrumbleNI,Trayshawn Durkin   Total critical care time: 40 minutes - NSTEMI  Critical care time was exclusive of separately billable procedures and treating other patients.  Critical care was necessary to treat or prevent imminent or life-threatening deterioration.  Critical care was time spent personally by me on the following activities: development of treatment plan with patient and/or surrogate as well as nursing, discussions with consultants, evaluation of patient's response to treatment, examination of patient, obtaining history from patient or surrogate, ordering and performing treatments and interventions, ordering and review of laboratory studies, ordering and review of radiographic studies, pulse oximetry and re-evaluation of patient's condition.   Final Clinical Impressions(s) / ED Diagnoses   Final diagnoses:  NSTEMI (non-ST elevated myocardial infarction) [facility](HCC)    New Prescriptions New Prescriptions   No medications on file     Tomasita CrumbleAdeleke Jamesia Linnen, MD 06/08/16 [phone]

## 2016-06-08 NOTE — Progress Notes (Signed)
Site area: right groin  Site Prior to Removal:  Level 0  Pressure Applied For 20 MINUTES    Minutes Beginning at 1540  Manual:   Yes.    Patient Status During Pull:  stable  Post Pull Groin Site:  Level 0  Post Pull Instructions Given:  Yes.    Post Pull Pulses Present:  Yes.    Dressing Applied:  Yes.    Comments:   

## 2016-06-09 ENCOUNTER — Encounter (HOSPITAL_COMMUNITY): Payer: Self-pay | Admitting: Cardiovascular Disease

## 2016-06-09 DIAGNOSIS — I519 Heart disease, unspecified: Secondary | ICD-10-CM

## 2016-06-09 DIAGNOSIS — Z95820 Peripheral vascular angioplasty status with implants and grafts: Secondary | ICD-10-CM

## 2016-06-09 DIAGNOSIS — I25110 Atherosclerotic heart disease of native coronary artery with unstable angina pectoris: Secondary | ICD-10-CM

## 2016-06-09 DIAGNOSIS — Z959 Presence of cardiac and vascular implant and graft, unspecified: Secondary | ICD-10-CM

## 2016-06-09 DIAGNOSIS — E782 Mixed hyperlipidemia: Secondary | ICD-10-CM

## 2016-06-09 HISTORY — DX: Heart disease, unspecified: I51.9

## 2016-06-09 HISTORY — DX: Peripheral vascular angioplasty status with implants and grafts: Z95.820

## 2016-06-09 LAB — COMPREHENSIVE METABOLIC PANEL
ALBUMIN: 3.6 g/dL (ref 3.5–5.0)
ALT: 22 U/L (ref 17–63)
AST: 72 U/L — AB (ref 15–41)
Alkaline Phosphatase: 65 U/L (ref 38–126)
Anion gap: 8 (ref 5–15)
BUN: 8 mg/dL (ref 6–20)
CHLORIDE: 104 mmol/L (ref 101–111)
CO2: 22 mmol/L (ref 22–32)
CREATININE: 0.96 mg/dL (ref 0.61–1.24)
Calcium: 8.9 mg/dL (ref 8.9–10.3)
GFR calc Af Amer: >60 mL/min (ref >60)
Glucose, Bld: 170 mg/dL — ABNORMAL HIGH (ref 65–99)
Potassium: 4.4 mmol/L (ref 3.5–5.1)
SODIUM: 134 mmol/L — AB (ref 135–145)
Total Bilirubin: 0.5 mg/dL (ref 0.3–1.2)
Total Protein: 6.2 g/dL — ABNORMAL LOW (ref 6.5–8.1)

## 2016-06-09 LAB — CBC
HCT: 35.2 % — ABNORMAL LOW (ref 39.0–52.0)
Hemoglobin: 12.5 g/dL — ABNORMAL LOW (ref 13.0–17.0)
MCH: 29.9 pg (ref 26.0–34.0)
MCHC: 35.5 g/dL (ref 30.0–36.0)
MCV: 84.2 fL (ref 78.0–100.0)
PLATELETS: 206 10*3/uL (ref 150–400)
RBC: 4.18 MIL/uL — ABNORMAL LOW (ref 4.22–5.81)
RDW: 12.2 % (ref 11.5–15.5)
WBC: 8 10*3/uL (ref 4.0–10.5)

## 2016-06-09 LAB — LIPID PANEL
CHOL/HDL RATIO: 3.3 ratio
Cholesterol: 122 mg/dL (ref 0–200)
HDL: 37 mg/dL — ABNORMAL LOW (ref >40)
LDL CALC: 61 mg/dL (ref 0–99)
Triglycerides: 122 mg/dL (ref <150)
VLDL: 24 mg/dL (ref 0–40)

## 2016-06-09 LAB — HEMOGLOBIN A1C
Hgb A1c MFr Bld: 7.4 % — ABNORMAL HIGH (ref 4.8–5.6)
MEAN PLASMA GLUCOSE: 166 mg/dL

## 2016-06-09 LAB — TROPONIN I: Troponin I: 6.6 ng/mL (ref <0.03)

## 2016-06-09 LAB — GLUCOSE, CAPILLARY: GLUCOSE-CAPILLARY: 146 mg/dL — AB (ref 65–99)

## 2016-06-09 LAB — PROTIME-INR
INR: 1.09
PROTHROMBIN TIME: 14.1 s (ref 11.4–15.2)

## 2016-06-09 LAB — POCT ACTIVATED CLOTTING TIME: Activated Clotting Time: 389 seconds

## 2016-06-09 MED ORDER — ACETAMINOPHEN 325 MG PO TABS: 650.0000 mg | ORAL_TABLET | ORAL | Status: DC | PRN

## 2016-06-09 MED ORDER — LISINOPRIL 5 MG PO TABS: 5.0000 mg | ORAL_TABLET | Freq: Every day | ORAL | 6 refills | Status: DC

## 2016-06-09 MED ORDER — TICAGRELOR 90 MG PO TABS: 90.0000 mg | ORAL_TABLET | Freq: Two times a day (BID) | ORAL | 11 refills | Status: DC

## 2016-06-09 MED ORDER — METFORMIN HCL 500 MG PO TABS: 500.0000 mg | ORAL_TABLET | Freq: Two times a day (BID) | ORAL | Status: DC

## 2016-06-09 MED ORDER — LISINOPRIL 5 MG PO TABS
5.0000 mg | ORAL_TABLET | Freq: Every day | ORAL | Status: DC
  Administered 2016-06-09: 5 mg via ORAL
  Filled 2016-06-09: qty 1

## 2016-06-09 MED ORDER — TICAGRELOR 90 MG PO TABS: 90.0000 mg | ORAL_TABLET | Freq: Two times a day (BID) | ORAL | 0 refills | Status: DC

## 2016-06-09 NOTE — Discharge Instructions (Signed)
Call [facility] [address] [facility] at [phone] if any bleeding, swelling or drainage at cath site.  May shower, no tub baths for 48 hours for groin sticks. No lifting over 5 pounds for 5 days.  No Driving for 5 days  Heart Healthy Diabetic diet.  Do not take Metformin until 06/11/16, it may interact with cath dye.    Do not miss asprin or brilinta, stopping or missing dose could cause a heart attack.    See your cardiologist within next 5-7 days.

## 2016-06-09 NOTE — Discharge Summary (Signed)
Discharge Summary    Patient ID: Luke Harvey,  MRN: [id number], DOB/AGE: Jan 30, 1946 71 y.o.  Admit date: 06/08/2016 Discharge date: 06/09/2016  Primary Care Provider: [facility] Primary Cardiologist: Dr. Patria Mane [facility]) At [facility], Dr. Eldridge Dace   Discharge Diagnoses    Principal Problem:   NSTEMI (non-ST elevated myocardial infarction) [facility]) Active Problems:   S/P angioplasty with stent, 06/08/16 DES, for in-stent restenosis in RCA.   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   Allergies Allergies  Allergen Reactions  . Tetracyclines & Related Hives  . Lipitor [Atorvastatin] Other (See Comments)    Sore muscles    Diagnostic Studies/Procedures    06/08/16 cardiac cath and PCI  Procedures   Coronary Stent Intervention  Left Heart Cath and Coronary Angiography  Conclusion     LM lesion, 95 %stenosed.  Prox RCA lesion, 20 %stenosed.  Mid RCA lesion, 30 %stenosed.  Prox LAD lesion, 100 %stenosed.  LIMA.  A STENT SYNERGY DES 2.5X16 drug eluting stent was successfully placed, and overlaps previously placed stent.  Dist RCA lesion, 95 %stenosed.  Post intervention, there is a 0% residual stenosis.   Mild LV dysfunction with mild mid anterolateral hypocontractility and an ejection fraction of 40-45%.  Significant native CAD with 95% distal left main and ostial LAD stenosis and occlusion of the LAD after the proximal septal and diagonal vessel; a flush and fill phenomena seen in the left circumflex vessel after the takeoff of a high marginal branch; and 20 and 30% proximal to mid RCA stenoses with 95% mid in-stent restenosis in the previously placed stent in the region of the acute margin.  Patent LIMA graft supplying the middle LAD.  Patent SVG supplying the circumflex marginal vessel  Successful PCI and DES stenting of the in-stent restenosis  in the RCA with insertion of a 2.516 mm Synergy DES stent postdilated to 2.75 mm with the stenoses being reduced to 0%.  RECOMMENDATION: Continue dapt for minimum of a year and probably indefinitely.  Aggressive lipid-lowering therapy.  Medical therapy with ACE inhibitor/ARB therapy with LV dysfunction and a blocker therapy.     _____________   History of Present Illness     71 y.o. male with a history of CAD s/p CABG x2V (2014) with subsequent PCI (2015), PAD with LE claudication, HTN, uncontrolled DMT2, HLD, hypothyroidism and previous tobacco abuse who presented to [facility] early AM of 06/08/16 with chest pain and found to have NSTEMI.    He has a history of CAD s/p CABG and PCI done in [place]. He is now followed by Dr Annia Friendly at [facility]. he also has known peripheral vascular disease but never has had any interventions. He takes Cilostazol. He has lower extremity claudication which limits his activity.   He was in his usual state of health until the day prior to admit when he developed chest fullness/tightness and feeling bloated.  NTG helped some.  But he also felt lightheaded and diaphoretic.    Placed on IV heparin and planned for admit and heart cath.  Pt placed on SSI for his diabetes.   Hospital Course     Consultants:  NONE  Pt.  Underwent cath later same day and underwent PCI for in stent restenosis of RCA at 95%, to 0% at end of case.  LIMA and VG to LAD patent.  Mild LV dysfunction with  mild mid anterolateral hypocontractility and an ejection fraction of 40-45%.  pk troponin 6.60, LDL 61, TSH 5.025 and hgb W0JA1C is 7.4.   Pt did well post procedure.  Today he has been seen by Dr. Eldridge DaceVaranasi and found stable for discharge.  [place]  EKG SR normal EKG.  Tele SR.  Glucose was controlled wit SSI. Cath site stable.   He is on Brilinat, and we added ACE.   _____________  Discharge Vitals Blood pressure 138/80, pulse 80,  temperature 97.8 F (36.6 C), temperature source Oral, resp. rate 17, height 5\' 6"  (1.676 m), weight 189 lb (85.7 kg), SpO2 100 %.  Filed Weights   06/08/16 0432 06/09/16 0109  Weight: 190 lb (86.2 kg) 189 lb (85.7 kg)   General:Pleasant affect, NAD Skin:Warm and dry, brisk capillary refill HEENT:normocephalic, sclera clear, mucus membranes moist Neck:supple, no JVD Heart:S1S2 RRR without murmur, gallup, rub or click Lungs:clear without rales, rhonchi, or wheezes WJX:BJYNAbd:soft, non tender, + BS, do not palpate liver spleen or masses Ext:no lower ext edema, 2+ pedal pulses, 2+ radial pulses Neuro:alert and oriented, MAE, follows commands, + facial symmetry  Labs & Radiologic Studies    CBC  Recent Labs  06/08/16 0434 06/09/16 0033  WBC 6.7 8.0  HGB 13.1 12.5*  HCT 37.3* 35.2*  MCV 84.6 84.2  PLT 204 206   Basic Metabolic Panel  Recent Labs  06/08/16 0434 06/09/16 0033  NA 134* 134*  K 4.5 4.4  CL 100* 104  CO2 23 22  GLUCOSE 186* 170*  BUN 11 8  CREATININE 0.95 0.96  CALCIUM 8.8* 8.9   Liver Function Tests  Recent Labs  06/09/16 0033  AST 72*  ALT 22  ALKPHOS 65  BILITOT 0.5  PROT 6.2*  ALBUMIN 3.6   No results for input(s): LIPASE, AMYLASE in the last 72 hours. Cardiac Enzymes  Recent Labs  06/08/16 1447 06/08/16 1814 06/09/16 0033  TROPONINI 4.06* 5.73* 6.60*   BNP Invalid input(s): POCBNP D-Dimer No results for input(s): DDIMER in the last 72 hours. Hemoglobin A1C  Recent Labs  06/08/16 1447  HGBA1C 7.4*   Fasting Lipid Panel  Recent Labs  06/09/16 0033  CHOL 122  HDL 37*  LDLCALC 61  TRIG 829122  CHOLHDL 3.3   Thyroid Function Tests  Recent Labs  06/08/16 1447  TSH 5.025*   _____________  Dg Chest 2 View  Result Date: 06/08/2016 CLINICAL DATA:  Chest pain starting yesterday morning. Shortness of breath. History of bypass graft. EXAM: CHEST  2 VIEW COMPARISON:  06/27/2010 FINDINGS: Postoperative changes in the mediastinum  and lower cervical spine. Normal heart size and pulmonary vascularity. Diffuse interstitial pattern to the lungs new since previous study, suggesting interstitial pneumonitis or edema. No focal consolidation or volume loss. No blunting of costophrenic angles. No pneumothorax. IMPRESSION: Interstitial pattern to the lungs suggesting edema or interstitial pneumonitis. Electronically Signed   By: Burman NievesWilliam  Stevens M.D.   On: 06/08/2016 05:09   Disposition   Pt is being discharged home today in good condition.  Follow-up Plans & Appointments   [facility] at [phone] if any bleeding, swelling or drainage at cath site.  May shower, no tub baths for 48 hours for groin sticks. No lifting over 5 pounds for 5 days.  No Driving for 5 days  Heart Healthy Diabetic diet.  Do not take Metformin until 06/11/16, it may interact with cath dye.  Do not miss asprin or brilinta, stopping or missing dose could cause a heart attack.    See your cardiologist within next 5-7 days.      Follow-up Information    Lance Muss, MD Follow up.   Specialties:  Cardiology, Radiology, Interventional Cardiology Why:  if you have problems prior to seeing your cardiologist you may call our office.   Contact information: [address] [place] [address] [phone]          Discharge Instructions    Amb Referral to Cardiac Rehabilitation    Complete by:  As directed    Diagnosis:   Coronary Stents PTCA        Discharge Medications   Current Discharge Medication List    START taking these medications   Details  acetaminophen (TYLENOL) 325 MG tablet Take 2 tablets (650 mg total) by mouth every 4 (four) hours as needed for headache or mild pain.    lisinopril (PRINIVIL,ZESTRIL) 5 MG tablet Take 1 tablet (5 mg total) by mouth daily. Qty: 30 tablet, Refills: 6    ticagrelor (BRILINTA) 90 MG TABS tablet Take 1 tablet (90 mg total) by mouth 2 (two)  times daily. Qty: 60 tablet, Refills: 11      CONTINUE these medications which have CHANGED   Details  metFORMIN (GLUCOPHAGE) 500 MG tablet Take 1 tablet (500 mg total) by mouth 2 (two) times daily with a meal.      CONTINUE these medications which have NOT CHANGED   Details  ALPRAZolam (XANAX) 0.5 MG tablet Take 0.25 mg by mouth 2 (two) times daily as needed for sleep.     aspirin EC 81 MG tablet Take 81 mg by mouth daily.    cilostazol (PLETAL) 100 MG tablet Take 100 mg by mouth daily.    citalopram (CELEXA) 40 MG tablet Take 20 mg by mouth daily.     doxycycline (VIBRA-TABS) 100 MG tablet Take 100 mg by mouth 2 (two) times daily.    glipiZIDE (GLUCOTROL) 5 MG tablet Take 2.5 mg by mouth daily before breakfast.    levothyroxine (SYNTHROID, LEVOTHROID) 50 MCG tablet Take 50 mcg by mouth daily before breakfast.    metoprolol (LOPRESSOR) 50 MG tablet Take 25 mg by mouth 2 (two) times daily.     Multiple Vitamins-Minerals (PRESERVISION AREDS PO) Take 1 tablet by mouth daily.    nitroGLYCERIN (NITROSTAT) 0.4 MG SL tablet Place 0.4 mg under the tongue every 5 (five) minutes as needed for chest pain.    rosuvastatin (CRESTOR) 40 MG tablet Take 20 mg by mouth daily.         Aspirin prescribed at discharge?  Yes High Intensity Statin Prescribed? (Lipitor 40-80mg  or Crestor 20-40mg ): yes  Beta Blocker Prescribed? Yes For EF <40%, was ACEI/ARB Prescribed? Yes ADP Receptor Inhibitor Prescribed? (i.e. Plavix etc.-Includes Medically Managed Patients): Yes For EF <40%, Aldosterone Inhibitor Prescribed? No: na Was EF assessed during THIS hospitalization? Yes Was Cardiac Rehab II ordered? (Included Medically managed Patients): Yes   Outstanding Labs/Studies   Would recommend BMP on OV.  Duration of Discharge Encounter   Greater than 30 minutes including physician time.  Signed, Nada Boozer NP 06/09/2016, 10:39 AM  I have examined the patient and reviewed assessment and plan  and discussed with patient.  Agree with above as stated.  S/p PCI of RCA.  Stressed importance of DAPT.  He had some mild SHOB with Brilntal last night but this spontaneously resolved.   RRR,  right groin stable.  2+ right PT pulse. CTA bilaterally.  Normal affect.  No rash.      Overall, he feels well.  He did well with walking and he wants to go home.   Plan for discharge. He will f/u with his cardiologist at the [facility].    Lance Muss

## 2016-06-09 NOTE — Progress Notes (Signed)
CARDIAC REHAB PHASE I   PRE:  Rate/Rhythm: 78 SR    BP: sitting 135/81    SaO2:   MODE:  Ambulation: 400 ft   POST:  Rate/Rhythm: 103 ST    BP: sitting 166/69     SaO2:   Slow pace, some lightheadedness and SOB. Overall did fine. He is not very active in general. Ed completed. Encouraged more activity. He has a bad hip that he is trying to avoid redo surgery so doesn't like to walk. Highly encouraged CRPII so he could do other equipment. Will refer to [place] CRPII. He understands importance of Brilinta/ASA.  [phone]   Harriet MassonRandi Kristan Cerra Eisenhower CES, ACSM 06/09/2016 9:13 AM

## 2016-07-06 ENCOUNTER — Other Ambulatory Visit: Payer: Self-pay | Admitting: Cardiology

## 2016-07-08 ENCOUNTER — Telehealth (HOSPITAL_COMMUNITY): Payer: Self-pay

## 2016-07-08 NOTE — Telephone Encounter (Signed)
Verified [employer] and [place] insurance benefits through [employer]port. No Co-Pay, No Co-insurance, No Deductible, Out of Pocket $5500.00, pt has met $300.00 pt's responsibility $5200.00 Reference [phone] & [phone]... KJ

## 2016-07-29 ENCOUNTER — Telehealth (HOSPITAL_COMMUNITY): Payer: Self-pay

## 2016-07-29 NOTE — Telephone Encounter (Signed)
Left message on voicemail for patient to call office. 

## 2016-08-05 ENCOUNTER — Encounter (HOSPITAL_COMMUNITY): Payer: Self-pay

## 2016-08-05 NOTE — Progress Notes (Signed)
Mailed letter with our CRP to pt... KJ °

## 2016-08-13 ENCOUNTER — Ambulatory Visit: Payer: Non-veteran care
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
## 2020-01-25 ENCOUNTER — Encounter: Payer: Self-pay | Admitting: Emergency Medicine

## 2020-01-25 ENCOUNTER — Ambulatory Visit
Admission: EM | Admit: 2020-01-25 | Discharge: 2020-01-25 | Disposition: A | Discharge status: Home / Self Care | Payer: Non-veteran care | Attending: Emergency Medicine | Admitting: Emergency Medicine

## 2020-01-25 ENCOUNTER — Other Ambulatory Visit: Payer: Self-pay

## 2020-01-25 DIAGNOSIS — R509 Fever, unspecified: Secondary | ICD-10-CM | POA: Diagnosis not present

## 2020-01-25 DIAGNOSIS — Z1152 Encounter for screening for COVID-19: Secondary | ICD-10-CM | POA: Diagnosis not present

## 2020-01-25 NOTE — ED Triage Notes (Addendum)
Pt here after having fever tonight of 100.2 and not eating much supper per family; pt took tylenol PTA

## 2020-01-25 NOTE — Discharge Instructions (Signed)
Tylenol for fever COVID test pending Follow up if developing more or changing symptoms

## 2020-01-26 NOTE — ED Provider Notes (Signed)
[facility]    CSN: [id number] Arrival date & time: 01/25/20  1944      History   Chief Complaint Chief Complaint  Patient presents with  . Fever    HPI MURICE BARBAR is a 74 y.o. male history of CAD, CHF, hypertension, hyperlipidemia, presenting today for evaluation of fever and decreased appetite.  Patient has had decreased appetite and noted a fever of 100.2 at home beginning today.  Denies any associated URI symptoms.  Denies GI symptoms.  Denies urinary symptoms.  Denies close sick contact/Covid exposures.  HPI  Past Medical History:  Diagnosis Date  . Age-related macular degeneration, dry, both eyes   . Anginal pain (HCC)   . Anxiety   . Arthritis    "probably all over" (06/08/2016)  . CHF (congestive heart failure) (HCC)   . Chronic back pain    "started in my lower back; going up my back in the last couple months" (06/08/2016)  . Chronic sinusitis   . Coronary artery disease    a. s/p CABGx 2V (2014 in [place])  b. PCI (2015 in [place])  . Diabetic peripheral neuropathy (HCC)   . DVT (deep venous thrombosis) (HCC)    "left groin; it was there when I had bypass OR; get it checked q yr; still there now" (06/08/2016)  . GERD (gastroesophageal reflux disease)   . Hepatitis B   . History of bleeding ulcers   . History of diverticulitis   . History of hiatal hernia   . HLD (hyperlipidemia)   . HTN (hypertension)   . 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 06/09/2016  . NSTEMI (non-ST elevated myocardial infarction) (HCC) 06/08/2016  . Pneumonia    "3-4 times maybe" (06/08/2016)  . PVD (peripheral vascular disease) (HCC)   . Recovering alcoholic (HCC)    "picked up my 30 year chip the other day" (06/08/2016)  . S/P angioplasty with stent, 06/08/16 DES, for in-stent restenosis in RCA. 06/09/2016  . Sleep apnea    "got a mask after OR; don't use it" (06/08/2016)  . Type II diabetes mellitus [facility])     Patient Active Problem List   Diagnosis Date Noted  .  S/P angioplasty with stent, 06/08/16 DES, for in-stent restenosis in RCA. 06/09/2016  . 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 06/09/2016  . NSTEMI (non-ST elevated myocardial infarction) (HCC) 06/08/2016  . PVD (peripheral vascular disease) (HCC)   . Diabetes mellitus (HCC)   . Coronary artery disease with hx CABG   . HLD (hyperlipidemia)     Past Surgical History:  Procedure Laterality Date  . ANTERIOR CERVICAL DECOMP/DISCECTOMY FUSION    . BACK SURGERY    . CARDIAC CATHETERIZATION  03/2012   "led to bypass"  . CAROTID ENDARTERECTOMY Right ~ 2014  . COLONOSCOPY W/ BIOPSIES AND POLYPECTOMY    . CORONARY ANGIOPLASTY WITH STENT PLACEMENT  2015; 06/08/2016  . CORONARY ARTERY BYPASS GRAFT  04/10/2012  . CORONARY STENT INTERVENTION N/A 06/08/2016   Procedure: Coronary Stent Intervention;  Surgeon: Lennette Bihari, MD;  Location: [facility] INVASIVE CV LAB;  Service: Cardiovascular;  Laterality: N/A;  . JOINT REPLACEMENT    . LEFT HEART CATH AND CORONARY ANGIOGRAPHY N/A 06/08/2016   Procedure: Left Heart Cath and Coronary Angiography;  Surgeon: Lennette Bihari, MD;  Location: [facility] INVASIVE CV LAB;  Service: Cardiovascular;  Laterality: N/A;  . REVISION TOTAL HIP ARTHROPLASTY Left ~ 2004  . TONSILLECTOMY    . TOTAL HIP ARTHROPLASTY Left 1990s  Home Medications    Prior to Admission medications   Medication Sig Start Date End Date Taking? Authorizing Provider  acetaminophen (TYLENOL) 325 MG tablet Take 2 tablets (650 mg total) by mouth every 4 (four) hours as needed for headache or mild pain. 06/09/16   Leone BrandIngold, Laura R, NP  ALPRAZolam Prudy Feeler(XANAX) 0.5 MG tablet Take 0.25 mg by mouth 2 (two) times daily as needed for sleep.     [provider]  aspirin EC 81 MG tablet Take 81 mg by mouth daily.    [provider]  cilostazol (PLETAL) 100 MG tablet Take 100 mg by mouth daily.    [provider]  citalopram (CELEXA) 40 MG tablet Take 20 mg by mouth daily.     [provider]  doxycycline (VIBRA-TABS) 100 MG tablet Take 100 mg by mouth 2 (two) times daily.    [provider]  glipiZIDE (GLUCOTROL) 5 MG tablet Take 2.5 mg by mouth daily before breakfast.    [provider]  levothyroxine (SYNTHROID, LEVOTHROID) 50 MCG tablet Take 50 mcg by mouth daily before breakfast.    [provider]  lisinopril (PRINIVIL,ZESTRIL) 5 MG tablet Take 1 tablet (5 mg total) by mouth daily. 06/09/16   Leone BrandIngold, Laura R, NP  metFORMIN (GLUCOPHAGE) 500 MG tablet Take 1 tablet (500 mg total) by mouth 2 (two) times daily with a meal. 06/11/16   Leone BrandIngold, Laura R, NP  metoprolol (LOPRESSOR) 50 MG tablet Take 25 mg by mouth 2 (two) times daily.     [provider]  Multiple Vitamins-Minerals (PRESERVISION AREDS PO) Take 1 tablet by mouth daily.    [provider]  nitroGLYCERIN (NITROSTAT) 0.4 MG SL tablet Place 0.4 mg under the tongue every 5 (five) minutes as needed for chest pain.    [provider]  rosuvastatin (CRESTOR) 40 MG tablet Take 20 mg by mouth daily.    [provider]  ticagrelor (BRILINTA) 90 MG TABS tablet Take 1 tablet (90 mg total) by mouth 2 (two) times daily. Patient needs to contact office for office visit for further refills [phone] 07/07/16   Corky CraftsVaranasi, Jayadeep S, MD    Family History Family History  Problem Relation Age of Onset  . Cancer Mother     Social History Social History   Tobacco Use  . Smoking status: Former Smoker    Packs/day: 1.00    Years: 40.00    Pack years: 40.00    Quit date: 07/17/2008    Years since quitting: 11.5  . Smokeless tobacco: Never Used  Substance Use Topics  . Alcohol use: No    Comment: recovering alcoholic, abstinence 30 years.  attends AA weekly  . Drug use: No     Allergies   Tetracyclines & related and Lipitor [atorvastatin]   Review of Systems Review of Systems  Constitutional: Positive for appetite change and fever. Negative for  activity change, chills and fatigue.  HENT: Negative for congestion, ear pain, rhinorrhea, sinus pressure, sore throat and trouble swallowing.   Eyes: Negative for discharge and redness.  Respiratory: Negative for cough, chest tightness and shortness of breath.   Cardiovascular: Negative for chest pain.  Gastrointestinal: Negative for abdominal pain, diarrhea, nausea and vomiting.  Genitourinary: Negative for dysuria and frequency.  Musculoskeletal: Negative for myalgias.  Skin: Negative for rash.  Neurological: Negative for dizziness, light-headedness and headaches.     Physical Exam Triage Vital Signs ED Triage Vitals [01/25/20 2039]  Enc Vitals Group  BP 128/88     Pulse Rate 93     Resp 18     Temp 98.9 F (37.2 C)     Temp Source Oral     SpO2 94 %     Weight      Height      Head Circumference      Peak Flow      Pain Score 0     Pain Loc      Pain Edu?      Excl. in GC?    No data found.  Updated Vital Signs BP 128/88 (BP Location: Left Arm)   Pulse 93   Temp 98.9 F (37.2 C) (Oral)   Resp 18   SpO2 94%   Visual Acuity Right Eye Distance:   Left Eye Distance:   Bilateral Distance:    Right Eye Near:   Left Eye Near:    Bilateral Near:     Physical Exam Vitals and nursing note reviewed.  Constitutional:      Appearance: He is well-developed.     Comments: No acute distress  HENT:     Head: Normocephalic and atraumatic.     Ears:     Comments: Bilateral ears without tenderness to palpation of external auricle, tragus and mastoid, EAC's without erythema or swelling, TM's with good bony landmarks and cone of light. Non erythematous.     Nose: Nose normal.     Mouth/Throat:     Comments: Oral mucosa pink and moist, no tonsillar enlargement or exudate. Posterior pharynx patent and nonerythematous, no uvula deviation or swelling. Normal phonation. Eyes:     Conjunctiva/sclera: Conjunctivae normal.  Cardiovascular:     Rate and Rhythm: Normal  rate.  Pulmonary:     Effort: Pulmonary effort is normal. No respiratory distress.     Comments: Breathing comfortably at rest, CTABL, no wheezing, rales or other adventitious sounds auscultated Abdominal:     General: There is no distension.     Comments: Soft, nondistended, nontender to light and deep palpation throughout entire abdomen  Musculoskeletal:        General: Normal range of motion.     Cervical back: Neck supple.  Skin:    General: Skin is warm and dry.  Neurological:     Mental Status: He is alert and oriented to person, place, and time.      UC Treatments / Results  Labs (all labs ordered are listed, but only abnormal results are displayed) Labs Reviewed  NOVEL CORONAVIRUS, NAA    EKG   Radiology No results found.  Procedures Procedures (including critical care time)  Medications Ordered in UC Medications - No data to display  Initial Impression / Assessment and Plan / UC Course  I have reviewed the triage vital signs and the nursing notes.  Pertinent labs & imaging results that were available during my care of the patient were reviewed by me and considered in my medical decision making (see chart for details).     Less than 24 hours of fever and decreased appetite, vital signs stable here today, exam reassuring, at baseline.  Unclear cause of fever.  At this time deferring any further work-up, monitor for development of other symptoms over the next few days.  Covid screening today.  Rest and fluids, Tylenol for fever.  Return if developing persistent or worsening symptoms.  Discussed strict return precautions. Patient verbalized understanding and is agreeable with plan.  Final Clinical Impressions(s) / UC Diagnoses  Final diagnoses:  Encounter for screening for COVID-19  Fever, unspecified     Discharge Instructions     Tylenol for fever COVID test pending Follow up if developing more or changing symptoms   ED Prescriptions    None       PDMP not reviewed this encounter.   Lew Dawes, [place] 01/26/20 1341

## 2020-01-27 LAB — NOVEL CORONAVIRUS, NAA: SARS-CoV-2, NAA: NOT DETECTED

## 2020-01-27 LAB — SARS-COV-2, NAA 2 DAY TAT

## 2020-03-31 ENCOUNTER — Other Ambulatory Visit: Payer: Self-pay

## 2020-03-31 ENCOUNTER — Encounter (HOSPITAL_COMMUNITY): Payer: Self-pay | Admitting: Emergency Medicine

## 2020-03-31 ENCOUNTER — Emergency Department (HOSPITAL_COMMUNITY): Payer: No Typology Code available for payment source

## 2020-03-31 ENCOUNTER — Inpatient Hospital Stay (HOSPITAL_COMMUNITY)
Admission: EM | Admit: 2020-03-31 | Discharge: 2020-04-05 | DRG: 246 | Disposition: A | Discharge status: Home / Self Care | Payer: No Typology Code available for payment source | Source: Ambulatory Visit | Attending: Cardiovascular Disease | Admitting: Cardiovascular Disease

## 2020-03-31 DIAGNOSIS — R079 Chest pain, unspecified: Secondary | ICD-10-CM

## 2020-03-31 DIAGNOSIS — Z955 Presence of coronary angioplasty implant and graft: Secondary | ICD-10-CM

## 2020-03-31 DIAGNOSIS — Z981 Arthrodesis status: Secondary | ICD-10-CM

## 2020-03-31 DIAGNOSIS — Z7982 Long term (current) use of aspirin: Secondary | ICD-10-CM

## 2020-03-31 DIAGNOSIS — I2510 Atherosclerotic heart disease of native coronary artery without angina pectoris: Secondary | ICD-10-CM | POA: Diagnosis present

## 2020-03-31 DIAGNOSIS — Z79899 Other long term (current) drug therapy: Secondary | ICD-10-CM

## 2020-03-31 DIAGNOSIS — E119 Type 2 diabetes mellitus without complications: Secondary | ICD-10-CM

## 2020-03-31 DIAGNOSIS — I350 Nonrheumatic aortic (valve) stenosis: Secondary | ICD-10-CM

## 2020-03-31 DIAGNOSIS — E785 Hyperlipidemia, unspecified: Secondary | ICD-10-CM | POA: Diagnosis present

## 2020-03-31 DIAGNOSIS — T82855A Stenosis of coronary artery stent, initial encounter: Principal | ICD-10-CM | POA: Diagnosis present

## 2020-03-31 DIAGNOSIS — Z87891 Personal history of nicotine dependence: Secondary | ICD-10-CM

## 2020-03-31 DIAGNOSIS — I200 Unstable angina: Secondary | ICD-10-CM | POA: Diagnosis present

## 2020-03-31 DIAGNOSIS — E039 Hypothyroidism, unspecified: Secondary | ICD-10-CM | POA: Diagnosis present

## 2020-03-31 DIAGNOSIS — I10 Essential (primary) hypertension: Secondary | ICD-10-CM | POA: Diagnosis present

## 2020-03-31 DIAGNOSIS — E871 Hypo-osmolality and hyponatremia: Secondary | ICD-10-CM

## 2020-03-31 DIAGNOSIS — Z96642 Presence of left artificial hip joint: Secondary | ICD-10-CM | POA: Diagnosis present

## 2020-03-31 DIAGNOSIS — Z20822 Contact with and (suspected) exposure to covid-19: Secondary | ICD-10-CM | POA: Diagnosis present

## 2020-03-31 DIAGNOSIS — T82857A Stenosis of cardiac prosthetic devices, implants and grafts, initial encounter: Secondary | ICD-10-CM | POA: Diagnosis present

## 2020-03-31 DIAGNOSIS — Z79890 Hormone replacement therapy (postmenopausal): Secondary | ICD-10-CM

## 2020-03-31 DIAGNOSIS — Z7984 Long term (current) use of oral hypoglycemic drugs: Secondary | ICD-10-CM

## 2020-03-31 DIAGNOSIS — E1142 Type 2 diabetes mellitus with diabetic polyneuropathy: Secondary | ICD-10-CM | POA: Diagnosis present

## 2020-03-31 DIAGNOSIS — E1151 Type 2 diabetes mellitus with diabetic peripheral angiopathy without gangrene: Secondary | ICD-10-CM | POA: Diagnosis present

## 2020-03-31 DIAGNOSIS — I252 Old myocardial infarction: Secondary | ICD-10-CM

## 2020-03-31 DIAGNOSIS — I25110 Atherosclerotic heart disease of native coronary artery with unstable angina pectoris: Secondary | ICD-10-CM | POA: Diagnosis present

## 2020-03-31 DIAGNOSIS — I358 Other nonrheumatic aortic valve disorders: Secondary | ICD-10-CM | POA: Diagnosis present

## 2020-03-31 DIAGNOSIS — Y831 Surgical operation with implant of artificial internal device as the cause of abnormal reaction of the patient, or of later complication, without mention of misadventure at the time of the procedure: Secondary | ICD-10-CM | POA: Diagnosis present

## 2020-03-31 HISTORY — DX: Cardiomyopathy, unspecified: I42.9

## 2020-03-31 HISTORY — DX: Nonrheumatic aortic (valve) stenosis: I35.0

## 2020-03-31 LAB — CBC
HCT: 36.2 % — ABNORMAL LOW (ref 39.0–52.0)
Hemoglobin: 12.1 g/dL — ABNORMAL LOW (ref 13.0–17.0)
MCH: 28.9 pg (ref 26.0–34.0)
MCHC: 33.4 g/dL (ref 30.0–36.0)
MCV: 86.6 fL (ref 80.0–100.0)
Platelets: 219 10*3/uL (ref 150–400)
RBC: 4.18 MIL/uL — ABNORMAL LOW (ref 4.22–5.81)
RDW: 13 % (ref 11.5–15.5)
WBC: 5.6 10*3/uL (ref 4.0–10.5)
nRBC: 0 % (ref 0.0–0.2)

## 2020-03-31 LAB — BASIC METABOLIC PANEL
Anion gap: 9 (ref 5–15)
BUN: 9 mg/dL (ref 8–23)
CO2: 24 mmol/L (ref 22–32)
Calcium: 8.8 mg/dL — ABNORMAL LOW (ref 8.9–10.3)
Chloride: 98 mmol/L (ref 98–111)
Creatinine, Ser: 1.02 mg/dL (ref 0.61–1.24)
GFR, Estimated: >60 mL/min (ref >60)
Glucose, Bld: 100 mg/dL — ABNORMAL HIGH (ref 70–99)
Potassium: 4.1 mmol/L (ref 3.5–5.1)
Sodium: 131 mmol/L — ABNORMAL LOW (ref 135–145)

## 2020-03-31 LAB — TROPONIN I (HIGH SENSITIVITY)
Troponin I (High Sensitivity): 14 ng/L (ref <18)
Troponin I (High Sensitivity): 13 ng/L (ref <18)

## 2020-03-31 LAB — CBG MONITORING, ED: Glucose-Capillary: 85 mg/dL (ref 70–99)

## 2020-03-31 NOTE — ED Triage Notes (Signed)
Pt sent by [facility] for further evaluation for abnormal EKG pt is been having cp for the past 3 days not getting better with nitroglycerin.

## 2020-04-01 ENCOUNTER — Encounter (HOSPITAL_COMMUNITY): Payer: Self-pay | Admitting: Emergency Medicine

## 2020-04-01 DIAGNOSIS — I200 Unstable angina: Secondary | ICD-10-CM | POA: Diagnosis not present

## 2020-04-01 LAB — RESP PANEL BY RT-PCR (FLU A&B, COVID) ARPGX2
Influenza A by PCR: NEGATIVE
Influenza B by PCR: NEGATIVE
SARS Coronavirus 2 by RT PCR: NEGATIVE

## 2020-04-01 LAB — CBG MONITORING, ED: Glucose-Capillary: 129 mg/dL — ABNORMAL HIGH (ref 70–99)

## 2020-04-01 LAB — TROPONIN I (HIGH SENSITIVITY): Troponin I (High Sensitivity): 11 ng/L (ref <18)

## 2020-04-01 MED ORDER — ONDANSETRON HCL 4 MG/2ML IJ SOLN: 4.0000 mg | Freq: Four times a day (QID) | INTRAMUSCULAR | Status: DC | PRN

## 2020-04-01 MED ORDER — SODIUM CHLORIDE 0.9% FLUSH
3.0000 mL | Freq: Two times a day (BID) | INTRAVENOUS | Status: DC
  Administered 2020-04-02 – 2020-04-03 (×3): 3 mL via INTRAVENOUS

## 2020-04-01 MED ORDER — ROSUVASTATIN CALCIUM 20 MG PO TABS
20.0000 mg | ORAL_TABLET | Freq: Every day | ORAL | Status: DC
  Administered 2020-04-03: 20 mg via ORAL
  Filled 2020-04-01: qty 1

## 2020-04-01 MED ORDER — LEVOTHYROXINE SODIUM 50 MCG PO TABS: 50.0000 ug | ORAL_TABLET | Freq: Every day | ORAL | Status: DC

## 2020-04-01 MED ORDER — LISINOPRIL 10 MG PO TABS
5.0000 mg | ORAL_TABLET | Freq: Every day | ORAL | Status: DC
  Administered 2020-04-01: 5 mg via ORAL
  Filled 2020-04-01: qty 1

## 2020-04-01 MED ORDER — ASPIRIN EC 81 MG PO TBEC
81.0000 mg | DELAYED_RELEASE_TABLET | Freq: Every day | ORAL | Status: DC
  Administered 2020-04-02 – 2020-04-04 (×3): 81 mg via ORAL
  Filled 2020-04-01 (×3): qty 1

## 2020-04-01 MED ORDER — HEPARIN SODIUM (PORCINE) 5000 UNIT/ML IJ SOLN
5000.0000 [IU] | Freq: Three times a day (TID) | INTRAMUSCULAR | Status: DC
  Administered 2020-04-02 – 2020-04-04 (×7): 5000 [IU] via SUBCUTANEOUS
  Filled 2020-04-01 (×7): qty 1

## 2020-04-01 MED ORDER — METOPROLOL TARTRATE 25 MG PO TABS
25.0000 mg | ORAL_TABLET | Freq: Two times a day (BID) | ORAL | Status: DC
  Administered 2020-04-01 – 2020-04-05 (×7): 25 mg via ORAL
  Filled 2020-04-01 (×7): qty 1

## 2020-04-01 MED ORDER — ALPRAZOLAM 0.25 MG PO TABS: 0.2500 mg | ORAL_TABLET | Freq: Two times a day (BID) | ORAL | Status: DC | PRN

## 2020-04-01 MED ORDER — ASPIRIN 325 MG PO TABS
325.0000 mg | ORAL_TABLET | Freq: Once | ORAL | Status: AC
  Administered 2020-04-01: 325 mg via ORAL
  Filled 2020-04-01: qty 1

## 2020-04-01 MED ORDER — LEVOTHYROXINE SODIUM 50 MCG PO TABS
50.0000 ug | ORAL_TABLET | ORAL | Status: AC
  Administered 2020-04-01: 50 ug via ORAL
  Filled 2020-04-01: qty 1

## 2020-04-01 MED ORDER — ACETAMINOPHEN 325 MG PO TABS: 650.0000 mg | ORAL_TABLET | ORAL | Status: DC | PRN

## 2020-04-01 MED ORDER — CITALOPRAM HYDROBROMIDE 20 MG PO TABS
20.0000 mg | ORAL_TABLET | Freq: Every day | ORAL | Status: DC
  Administered 2020-04-03 – 2020-04-05 (×2): 20 mg via ORAL
  Filled 2020-04-01 (×2): qty 1

## 2020-04-01 MED ORDER — GLIPIZIDE 2.5 MG HALF TABLET: 2.5000 mg | ORAL_TABLET | Freq: Every day | ORAL | Status: DC

## 2020-04-01 MED ORDER — GLIPIZIDE 2.5 MG HALF TABLET
2.5000 mg | ORAL_TABLET | ORAL | Status: AC
  Filled 2020-04-01 (×2): qty 1

## 2020-04-01 MED ORDER — NITROGLYCERIN 0.4 MG SL SUBL: 0.4000 mg | SUBLINGUAL_TABLET | SUBLINGUAL | Status: DC | PRN

## 2020-04-01 NOTE — H&P (Addendum)
Cardiology Admission History and Physical:   Patient ID: HARUTO DEMARIA MRN: [id number]; DOB: 12/29/1945   Admission date: 03/31/2020  Primary Care Provider: Clinic, [facility] Cardiologist: No primary care provider on file.  [facility] Electrophysiologist:  None   Chief Complaint:  Chest pain  Patient Profile:   RYLER LASKOWSKI is a 74 y.o. male with PMH of CABG 2V '14 with subsequent PCI '15, HTN, HLD, PAD with LE claudication, hypothyroidism, and previous tobacco use who presented with chest pain.   History of Present Illness:   Mr. Gwaltney is a 74 yo male with PMH noted above. He underwent CABG in 2014 with LIMA-LAD and SVG-LCx with subsequent PCI in 2015 in [place]. Now follows through the [place] [facility] [place]. He presented to [facility] in 2018 with chest pain and found to have a NSTEMI. Troponin I peaked at 6.6. Underwent cardiac cath with severe native disease noted, but patent LIMA-LAD and SVG-LCx. New lesion in the ISR in the RCA treated with PCI/DESx1 and placed on DAPT with ASA/Brilinta for at least one year. EF noted at 40-45% with anterolateral WMA.    Has been doing well up until the past couple of weeks. States he has been having intermittent episodes of chest heaviness with exertion. Has been taking a single nitro with resolution of symptoms. Two days ago he had several episodes within a day and took 3 SL nitro. States episodes seem to be more freq. Went to eat with his girlfriend yesterday at [employer], walked out to the car after eating and developed chest pain. Took a SL nitro with improvement. Given he has had so many episodes recently he went to the [place] clinic and had an EKG done. Was directly to come to [facility] for evaluation.   Presented to the ED 12/6 with symptoms. Labs showed Na+ 131, Cr 1.02, hsTn 14>>13, Hgb 12.1, WBC 5.6. EKG showed SR with no acute ischemia. CXR without overt edema. No chest pain since presenting to the ED.   Past Medical  History:  Diagnosis Date  . Age-related macular degeneration, dry, both eyes   . Anginal pain (HCC)   . Anxiety   . Arthritis    "probably all over" (06/08/2016)  . CHF (congestive heart failure) (HCC)   . Chronic back pain    "started in my lower back; going up my back in the last couple months" (06/08/2016)  . Chronic sinusitis   . Coronary artery disease    a. s/p CABGx 2V (2014 in [place])  b. PCI (2015 in [place])  . Diabetic peripheral neuropathy (HCC)   . DVT (deep venous thrombosis) (HCC)    "left groin; it was there when I had bypass OR; get it checked q yr; still there now" (06/08/2016)  . GERD (gastroesophageal reflux disease)   . Hepatitis B   . History of bleeding ulcers   . History of diverticulitis   . History of hiatal hernia   . HLD (hyperlipidemia)   . HTN (hypertension)   . 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 06/09/2016  . NSTEMI (non-ST elevated myocardial infarction) (HCC) 06/08/2016  . Pneumonia    "3-4 times maybe" (06/08/2016)  . PVD (peripheral vascular disease) (HCC)   . Recovering alcoholic (HCC)    "picked up my 30 year chip the other day" (06/08/2016)  . S/P angioplasty with stent, 06/08/16 DES, for in-stent restenosis in RCA. 06/09/2016  . Sleep apnea    "got a mask after OR; don't use  it" (06/08/2016)  . Type II diabetes mellitus (HCC)     Past Surgical History:  Procedure Laterality Date  . ANTERIOR CERVICAL DECOMP/DISCECTOMY FUSION    . BACK SURGERY    . CARDIAC CATHETERIZATION  03/2012   "led to bypass"  . CAROTID ENDARTERECTOMY Right ~ 2014  . COLONOSCOPY W/ BIOPSIES AND POLYPECTOMY    . CORONARY ANGIOPLASTY WITH STENT PLACEMENT  2015; 06/08/2016  . CORONARY ARTERY BYPASS GRAFT  04/10/2012  . CORONARY STENT INTERVENTION N/A 06/08/2016   Procedure: Coronary Stent Intervention;  Surgeon: Lennette Bihari, MD;  Location: [facility] INVASIVE CV LAB;  Service: Cardiovascular;  Laterality: N/A;  . JOINT REPLACEMENT    . LEFT HEART CATH AND CORONARY  ANGIOGRAPHY N/A 06/08/2016   Procedure: Left Heart Cath and Coronary Angiography;  Surgeon: Lennette Bihari, MD;  Location: [facility] INVASIVE CV LAB;  Service: Cardiovascular;  Laterality: N/A;  . REVISION TOTAL HIP ARTHROPLASTY Left ~ 2004  . TONSILLECTOMY    . TOTAL HIP ARTHROPLASTY Left 1990s     Medications Prior to Admission: Prior to Admission medications   Medication Sig Start Date End Date Taking? Authorizing Provider  acetaminophen (TYLENOL) 325 MG tablet Take 2 tablets (650 mg total) by mouth every 4 (four) hours as needed for headache or mild pain. 06/09/16  Yes Leone Brand, NP  ALPRAZolam Prudy Feeler) 0.5 MG tablet Take 0.25 mg by mouth 2 (two) times daily as needed for sleep.    Yes [provider]  aspirin EC 81 MG tablet Take 81 mg by mouth daily.   Yes [provider]  cilostazol (PLETAL) 100 MG tablet Take 100 mg by mouth 2 (two) times daily.    Yes [provider]  citalopram (CELEXA) 40 MG tablet Take 20 mg by mouth daily.    Yes [provider]  Cyanocobalamin (VITAMIN B12 PO) Take 2,500 mcg by mouth daily.   Yes [provider]  furosemide (LASIX) 20 MG tablet Take 20 mg by mouth as needed (leg swelling).   Yes [provider]  glipiZIDE (GLUCOTROL) 5 MG tablet Take 5 mg by mouth daily.    Yes [provider]  levothyroxine (SYNTHROID, LEVOTHROID) 50 MCG tablet Take 50 mcg by mouth daily before breakfast.   Yes [provider]  metFORMIN (GLUCOPHAGE-XR) 750 MG 24 hr tablet Take 750 mg by mouth 2 (two) times daily.   Yes [provider]  metoprolol (LOPRESSOR) 50 MG tablet Take 25 mg by mouth 2 (two) times daily.    Yes [provider]  Multiple Vitamins-Minerals (PRESERVISION AREDS PO) Take 1 tablet by mouth daily.   Yes [provider]  nitroGLYCERIN (NITROSTAT) 0.4 MG SL tablet Place 0.4 mg under the tongue every 5 (five) minutes as needed for chest pain.   Yes [provider]  polycarbophil (FIBERCON) 625 MG tablet Take 625 mg by mouth daily.   Yes [provider]  rosuvastatin (CRESTOR) 40 MG tablet Take 20 mg by mouth daily.   Yes [provider]  VITAMIN D PO Take 1 tablet by mouth daily.   Yes [provider]  lisinopril (PRINIVIL,ZESTRIL) 5 MG tablet Take 1 tablet (5 mg total) by mouth daily. Patient not taking: Reported on 04/01/2020 06/09/16   Leone Brand, NP  metFORMIN (GLUCOPHAGE) 500 MG tablet Take 1 tablet (500 mg total) by mouth 2 (two) times daily with a meal. Patient not taking: Reported on 04/01/2020 06/11/16   Leone Brand, NP  ticagrelor (BRILINTA) 90 MG TABS tablet Take 1 tablet (90 mg total) by mouth 2 (two) times daily. Patient needs to contact office for office visit for further refills [phone] Patient not taking: Reported on 04/01/2020 07/07/16   Corky CraftsVaranasi, Jayadeep S, MD     Allergies:    Allergies  Allergen Reactions  . Tetracyclines & Related Hives  . Lisinopril Cough  . Lipitor [Atorvastatin] Other (See Comments)    Sore muscles    Social History:   Social History   Socioeconomic History  . Marital status: Divorced    Spouse name: Not on file  . Number of children: Not on file  . Years of education: Not on file  . Highest education level: Not on file  Occupational History  . Not on file  Tobacco Use  . Smoking status: Former Smoker    Packs/day: 1.00    Years: 40.00    Pack years: 40.00    Quit date: 07/17/2008    Years since quitting: 11.7  . Smokeless tobacco: Never Used  Substance and Sexual Activity  . Alcohol use: No    Comment: recovering alcoholic, abstinence 30 years.  attends AA weekly  . Drug use: No  . Sexual activity: Never  Other Topics Concern  . Not on file  Social History Narrative  . Not on file   Social Determinants of Health   Financial Resource Strain:   . Difficulty of Paying Living Expenses: Not on file  Food Insecurity:   . Worried About Brewing technologistunning Out of  Food in the Last Year: Not on file  . Ran Out of Food in the Last Year: Not on file  Transportation Needs:   . Lack of Transportation (Medical): Not on file  . Lack of Transportation (Non-Medical): Not on file  Physical Activity:   . Days of Exercise per Week: Not on file  . Minutes of Exercise per Session: Not on file  Stress:   . Feeling of Stress : Not on file  Social Connections:   . Frequency of Communication with Friends and Family: Not on file  . Frequency of Social Gatherings with Friends and Family: Not on file  . Attends Religious Services: Not on file  . Active Member of Clubs or Organizations: Not on file  . Attends BankerClub or Organization Meetings: Not on file  . Marital Status: Not on file  Intimate Partner Violence:   . Fear of Current or Ex-Partner: Not on file  . Emotionally Abused: Not on file  . Physically Abused: Not on file  . Sexually Abused: Not on file    Family History:   The patient's family history includes Cancer in his mother.    ROS:  Please see the history of present illness.  All other ROS reviewed and negative.     Physical Exam/Data:   Vitals:   04/01/20 1145 04/01/20 1210 04/01/20 1430 04/01/20 1515  BP: (!) 168/85  (!) 165/84 (!) 138/93  Pulse: 68  72 60  Resp: 19  20 16   Temp:    98.1 F (36.7 C)  TempSrc:    Oral  SpO2: 97%  98% 99%  Weight:  83.9 kg    Height:  5\' 6"  (1.676 m)     No intake or output data in the 24 hours ending 04/01/20 1616 Last 3 Weights 04/01/2020 06/09/2016 06/08/2016  Weight (lbs) 185 lb 189 lb 190 lb  Weight (kg) 83.915 kg 85.73 kg 86.183 kg     Body mass  index is 29.86 kg/m.  General:  Well nourished, well developed, in no acute distress HEENT: normal Lymph: no adenopathy Neck: no JVD Endocrine:  No thryomegaly Vascular: No carotid bruits; FA pulses 2+ bilaterally without bruits  Cardiac:  normal S1, S2; RRR; no murmur  Lungs:  clear to auscultation bilaterally, no wheezing, rhonchi or rales  Abd:  soft, nontender, no hepatomegaly  Ext: no edema Musculoskeletal:  No deformities, BUE and BLE strength normal and equal Skin: warm and dry  Neuro:  CNs 2-12 intact, no focal abnormalities noted Psych:  Normal affect    EKG:  The ECG that was done 03/31/20 was personally reviewed and demonstrates SR with 71bpm, no ST/T wave changes  Relevant CV Studies:  Cath: 05/2016   LM lesion, 95 %stenosed.  Prox RCA lesion, 20 %stenosed.  Mid RCA lesion, 30 %stenosed.  Prox LAD lesion, 100 %stenosed.  LIMA.  A STENT SYNERGY DES 2.5X16 drug eluting stent was successfully placed, and overlaps previously placed stent.  Dist RCA lesion, 95 %stenosed.  Post intervention, there is a 0% residual stenosis.   Mild LV dysfunction with mild mid anterolateral hypocontractility and an ejection fraction of 40-45%.  Significant native CAD with 95% distal left main and ostial LAD stenosis and occlusion of the LAD after the proximal septal and diagonal vessel; a flush and fill phenomena seen in the left circumflex vessel after the takeoff of a high marginal branch; and 20 and 30% proximal to mid RCA stenoses with 95% mid in-stent restenosis in the previously placed stent in the region of the acute margin.  Patent LIMA graft supplying the middle LAD.  Patent SVG supplying the circumflex marginal vessel  Successful PCI and DES stenting of the in-stent restenosis in the RCA with insertion of a 2.516 mm Synergy DES stent postdilated to 2.75 mm with the stenoses being reduced to 0%.  RECOMMENDATION: Continue dapt for minimum of a year and probably indefinitely.  Aggressive lipid-lowering therapy.  Medical therapy with ACE inhibitor/ARB therapy with LV dysfunction and a blocker therapy.  Diagnostic Dominance: Right  Intervention     Laboratory Data:  High Sensitivity Troponin:   Recent Labs  Lab 03/31/20 1647 03/31/20 2044  TROPONINIHS 14 13      Chemistry Recent Labs  Lab  03/31/20 1647  NA 131*  K 4.1  CL 98  CO2 24  GLUCOSE 100*  BUN 9  CREATININE 1.02  CALCIUM 8.8*  GFRNONAA >60  ANIONGAP 9    No results for input(s): PROT, ALBUMIN, AST, ALT, ALKPHOS, BILITOT in the last 168 hours. Hematology Recent Labs  Lab 03/31/20 1647  WBC 5.6  RBC 4.18*  HGB 12.1*  HCT 36.2*  MCV 86.6  MCH 28.9  MCHC 33.4  RDW 13.0  PLT 219   BNPNo results for input(s): BNP, PROBNP in the last 168 hours.  DDimer No results for input(s): DDIMER in the last 168 hours.   Radiology/Studies:  DG Chest 2 View  Result Date: 03/31/2020 CLINICAL DATA:  Chest pain x1 day. EXAM: CHEST - 2 VIEW COMPARISON:  June 08, 2016 FINDINGS: Multiple sternal wires and vascular clips are seen. Stable, diffuse chronic appearing increased interstitial lung markings are seen. Stable mild to moderate severity areas of scarring and/or atelectasis are seen within the bilateral lung bases. There is no evidence of a pleural effusion or pneumothorax. The heart size and mediastinal contours are within normal limits. A radiopaque fusion plate and screws are seen overlying the cervical spine. IMPRESSION: 1.  Evidence of prior median sternotomy/CABG. 2. Stable bibasilar scarring and/or atelectasis. Electronically Signed   By: Aram Candela M.D.   On: 03/31/2020 17:28     Assessment and Plan:   ANDREW BLASIUS is a 74 y.o. male with PMH of CABG 2V '14 with subsequent PCI '15, HTN, HLD, PAD with LE claudication, hypothyroidism, and previous tobacco use who presented with chest pain.   1. Unstable Angina: has been having intermittent chest pain with exertion over the past couple of weeks which are responsive to SL nitro. hsTn are negative thus far. No chest pain since presenting to the ED. Given ongoing symptoms, with known CAD will need cardiac cath to reassess coronary anatomy.  -- NPO after midnight -- The patient understands that risks included but are not limited to stroke (1 in 1000), death  (1 in 1000), kidney failure [usually temporary] (1 in 500), bleeding (1 in 200), allergic reaction [possibly serious] (1 in 200).  -- echo  2. CAD s/p 2v CABG '14: last seen in 2018 with ISR in RCA with PCI. Placed on DAPT then. No issues with bleeding. Plan cath tomorrow as above -- continue ASA, statin  3. HTN: continue on metoprolol  BID  4. HLD: continue statin -- Lipids in am  5. DM: hold home metformin -- SSI  HEAR Score (for undifferentiated chest pain):  HEAR Score: 5   Severity of Illness: The appropriate patient status for this patient is OBSERVATION. Observation status is judged to be reasonable and necessary in order to provide the required intensity of service to ensure the patient's safety. The patient's presenting symptoms, physical exam findings, and initial radiographic and laboratory data in the context of their medical condition is felt to place them at decreased risk for further clinical deterioration. Furthermore, it is anticipated that the patient will be medically stable for discharge from the hospital within 2 midnights of admission. The following factors support the patient status of observation.   " The patient's presenting symptoms include chest pain. " The physical exam findings include stable exam. " The initial radiographic and laboratory data with stable labs.  For questions or updates, please contact [facility] Please consult [email or web address] for contact info under     Signed, Laverda Page, NP  04/01/2020 4:16 PM   I have personally seen and examined this patient. I agree with the assessment and plan as outlined above.  74 yo male with history of CAD s/p 2V CABG in 2014 with subsequent PCI in 2015 and 2018, DM, HTN, HLD, prior tobacco abuse, PAD and hypothyroidism who is presenting to the ED today with c/o chest pain. 2V CABG in [place] in 2014. Stent placement in RCA in 2015. Admitted to [facility] 2018 with a NSTEMI. Severe restenosis mid  RCA stent in 2018 treated with a drug eluting stent. He presents to the ED today with c/o chest pain. Troponin negative x 2. EKG personally reviewed by me and shows sinus rhythm with no ischemic changes.   My exam:  General: Well developed, well nourished, NAD  HEENT: OP clear, mucus membranes moist  SKIN: warm, dry. No rashes.  Neuro: No focal deficits  Musculoskeletal: Muscle strength 5/5 all ext  Psychiatric: Mood and affect normal  Neck: No JVD, no carotid bruits, no thyromegaly, no lymphadenopathy.  Lungs:Clear bilaterally, no wheezes, rhonci, crackles  Cardiovascular: Regular rate and rhythm. No murmurs, gallops or rubs.  Abdomen:Soft. Bowel sounds present. Non-tender.  Extremities: No lower extremity edema. Pulses are 2 +  in the bilateral DP/PT.   Plan:  CAD with unstable angina: No objective evidence of ischemia but given his presentation and history of CAD, will proceed with cardiac cath with possible PCI. I have reviewed the risks, indications, and alternatives to cardiac catheterization, possible angioplasty, and stenting with the patient. Risks include but are not limited to bleeding, infection, vascular injury, stroke, myocardial infection, arrhythmia, kidney injury, radiation-related injury in the case of prolonged fluoroscopy use, emergency cardiac surgery, and death. The patient understands the risks of serious complication is 1-2 in 1000 with diagnostic cardiac cath and 1-2% or less with angioplasty/stenting.  -Cardiac cath tomorrow. NPO at midnight.  -No indication for heparin currently  Verne Carrow  04/01/2020  4:21 PM

## 2020-04-01 NOTE — ED Provider Notes (Signed)
[facility] EMERGENCY DEPARTMENT Provider Note   CSN: [id number] Arrival date & time: 03/31/20  1614     History Chief Complaint  Patient presents with  . Chest Pain    Luke Harvey is a 74 y.o. male.  HPI   74 year old male with past medical history of extensive CAD status post CABG and most recently left heart catheterization and PCI in February 2018, HTN, HLD, hypothyroidism, DM presents to the emergency department with chest pain for the past 3 days.  Patient states he follows up twice a year at the [facility] cardiology clinic, no specific physician in particular.  He states for the last 3 days when he exerts himself he gets a midsternal chest heaviness and tightness.  At first started it was relieved by rest, in the past 2 days he has required nitroglycerin to relieve the pain and most recently a day ago required 3 nitroglycerin to subside the chest heaviness.  Patient has intermittent shortness of breath.  Since arriving at this emergency department he has had no further chest pain, currently at rest he is chest pain-free.  Denies any other recent illness including fever, cough, vomiting/diarrhea, swelling of his lower extremities.  Past Medical History:  Diagnosis Date  . Age-related macular degeneration, dry, both eyes   . Anginal pain (HCC)   . Anxiety   . Arthritis    "probably all over" (06/08/2016)  . CHF (congestive heart failure) (HCC)   . Chronic back pain    "started in my lower back; going up my back in the last couple months" (06/08/2016)  . Chronic sinusitis   . Coronary artery disease    a. s/p CABGx 2V (2014 in [place])  b. PCI (2015 in [place])  . Diabetic peripheral neuropathy (HCC)   . DVT (deep venous thrombosis) (HCC)    "left groin; it was there when I had bypass OR; get it checked q yr; still there now" (06/08/2016)  . GERD (gastroesophageal reflux disease)   . Hepatitis B   . History of bleeding ulcers   . History of diverticulitis    . History of hiatal hernia   . HLD (hyperlipidemia)   . HTN (hypertension)   . 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 06/09/2016  . NSTEMI (non-ST elevated myocardial infarction) (HCC) 06/08/2016  . Pneumonia    "3-4 times maybe" (06/08/2016)  . PVD (peripheral vascular disease) (HCC)   . Recovering alcoholic (HCC)    "picked up my 30 year chip the other day" (06/08/2016)  . S/P angioplasty with stent, 06/08/16 DES, for in-stent restenosis in RCA. 06/09/2016  . Sleep apnea    "got a mask after OR; don't use it" (06/08/2016)  . Type II diabetes mellitus [facility])     Patient Active Problem List   Diagnosis Date Noted  . S/P angioplasty with stent, 06/08/16 DES, for in-stent restenosis in RCA. 06/09/2016  . 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 06/09/2016  . NSTEMI (non-ST elevated myocardial infarction) (HCC) 06/08/2016  . PVD (peripheral vascular disease) (HCC)   . Diabetes mellitus (HCC)   . Coronary artery disease with hx CABG   . HLD (hyperlipidemia)     Past Surgical History:  Procedure Laterality Date  . ANTERIOR CERVICAL DECOMP/DISCECTOMY FUSION    . BACK SURGERY    . CARDIAC CATHETERIZATION  03/2012   "led to bypass"  . CAROTID ENDARTERECTOMY Right ~ 2014  . COLONOSCOPY W/ BIOPSIES AND POLYPECTOMY    . CORONARY ANGIOPLASTY WITH STENT  PLACEMENT  2015; 06/08/2016  . CORONARY ARTERY BYPASS GRAFT  04/10/2012  . CORONARY STENT INTERVENTION N/A 06/08/2016   Procedure: Coronary Stent Intervention;  Surgeon: Lennette Biharihomas A Kelly, MD;  [facility];  Service: Cardiovascular;  Laterality: N/A;  . JOINT REPLACEMENT    . LEFT HEART CATH AND CORONARY ANGIOGRAPHY N/A 06/08/2016   Procedure: Left Heart Cath and Coronary Angiography;  Surgeon: Lennette Biharihomas A Kelly, MD;  [facility];  Service: Cardiovascular;  Laterality: N/A;  . REVISION TOTAL HIP ARTHROPLASTY Left ~ 2004  . TONSILLECTOMY    . TOTAL HIP ARTHROPLASTY Left 1990s       Family History  Problem Relation Age of  Onset  . Cancer Mother     Social History   Tobacco Use  . Smoking status: Former Smoker    Packs/day: 1.00    Years: 40.00    Pack years: 40.00    Quit date: 07/17/2008    Years since quitting: 11.7  . Smokeless tobacco: Never Used  Substance Use Topics  . Alcohol use: No    Comment: recovering alcoholic, abstinence 30 years.  attends AA weekly  . Drug use: No    Home Medications Prior to Admission medications   Medication Sig Start Date End Date Taking? Authorizing Provider  acetaminophen (TYLENOL) 325 MG tablet Take 2 tablets (650 mg total) by mouth every 4 (four) hours as needed for headache or mild pain. 06/09/16   Leone BrandIngold, Laura R, NP  ALPRAZolam Prudy Feeler(XANAX) 0.5 MG tablet Take 0.25 mg by mouth 2 (two) times daily as needed for sleep.     [provider]  aspirin EC 81 MG tablet Take 81 mg by mouth daily.    [provider]  cilostazol (PLETAL) 100 MG tablet Take 100 mg by mouth daily.    [provider]  citalopram (CELEXA) 40 MG tablet Take 20 mg by mouth daily.     [provider]  doxycycline (VIBRA-TABS) 100 MG tablet Take 100 mg by mouth 2 (two) times daily.    [provider]  glipiZIDE (GLUCOTROL) 5 MG tablet Take 2.5 mg by mouth daily before breakfast.    [provider]  levothyroxine (SYNTHROID, LEVOTHROID) 50 MCG tablet Take 50 mcg by mouth daily before breakfast.    [provider]  lisinopril (PRINIVIL,ZESTRIL) 5 MG tablet Take 1 tablet (5 mg total) by mouth daily. 06/09/16   Leone BrandIngold, Laura R, NP  metFORMIN (GLUCOPHAGE) 500 MG tablet Take 1 tablet (500 mg total) by mouth 2 (two) times daily with a meal. 06/11/16   Leone BrandIngold, Laura R, NP  metoprolol (LOPRESSOR) 50 MG tablet Take 25 mg by mouth 2 (two) times daily.     [provider]  Multiple Vitamins-Minerals (PRESERVISION AREDS PO) Take 1 tablet by mouth daily.    [provider]  nitroGLYCERIN (NITROSTAT) 0.4 MG SL tablet Place 0.4 mg under  the tongue every 5 (five) minutes as needed for chest pain.    [provider]  rosuvastatin (CRESTOR) 40 MG tablet Take 20 mg by mouth daily.    [provider]  ticagrelor (BRILINTA) 90 MG TABS tablet Take 1 tablet (90 mg total) by mouth 2 (two) times daily. Patient needs to contact office for office visit for further refills [phone] 07/07/16   Corky CraftsVaranasi, Jayadeep S, MD    Allergies    Tetracyclines & related and Lipitor [atorvastatin]  Review of Systems   Review of Systems  Constitutional: Positive for  fatigue. Negative for chills and fever.  HENT: Negative for congestion.   Eyes: Negative for visual disturbance.  Respiratory: Positive for chest tightness. Negative for shortness of breath.   Cardiovascular: Positive for chest pain. Negative for palpitations and leg swelling.  Gastrointestinal: Negative for abdominal pain, diarrhea and vomiting.  Genitourinary: Negative for dysuria.  Skin: Negative for rash.  Neurological: Negative for headaches.    Physical Exam Updated Vital Signs BP (!) 181/86   Pulse 71   Temp 97.8 F (36.6 C) (Oral)   Resp 19   Ht 5\' 6"  (1.676 m)   SpO2 98%   BMI 30.51 kg/m   Physical Exam Vitals and nursing note reviewed.  Constitutional:      Appearance: Normal appearance.  HENT:     Head: Normocephalic.     Mouth/Throat:     Mouth: Mucous membranes are moist.  Cardiovascular:     Rate and Rhythm: Normal rate.  Pulmonary:     Effort: Pulmonary effort is normal. No tachypnea or respiratory distress.     Breath sounds: Examination of the right-lower field reveals decreased breath sounds. Examination of the left-lower field reveals decreased breath sounds. Decreased breath sounds present.  Chest:     Chest wall: No crepitus.     Comments: Sternotomy scar Abdominal:     Palpations: Abdomen is soft.     Tenderness: There is no abdominal tenderness. There is no guarding.  Musculoskeletal:     Right lower leg: No edema.      Left lower leg: No edema.  Skin:    General: Skin is warm.  Neurological:     Mental Status: He is alert and oriented to person, place, and time. Mental status is at baseline.  Psychiatric:        Mood and Affect: Mood normal.     ED Results / Procedures / Treatments   Labs (all labs ordered are listed, but only abnormal results are displayed) Labs Reviewed  BASIC METABOLIC PANEL - Abnormal; Notable for the following components:      Result Value   Sodium 131 (*)    Glucose, Bld 100 (*)    Calcium 8.8 (*)    All other components within normal limits  CBC - Abnormal; Notable for the following components:   RBC 4.18 (*)    Hemoglobin 12.1 (*)    HCT 36.2 (*)    All other components within normal limits  CBG MONITORING, ED  TROPONIN I (HIGH SENSITIVITY)  TROPONIN I (HIGH SENSITIVITY)    EKG EKG Interpretation  Date/Time:  Monday March 31 2020 16:32:31 EST Ventricular Rate:  71 PR Interval:  164 QRS Duration: 90 QT Interval:  390 QTC Calculation: 423 R Axis:   21 Text Interpretation: Normal sinus rhythm Normal ECG No significant change since last tracing Confirmed by 03-22-2004 [phone]) on 04/01/2020 8:18:24 AM   Radiology DG Chest 2 View  Result Date: 03/31/2020 CLINICAL DATA:  Chest pain x1 day. EXAM: CHEST - 2 VIEW COMPARISON:  June 08, 2016 FINDINGS: Multiple sternal wires and vascular clips are seen. Stable, diffuse chronic appearing increased interstitial lung markings are seen. Stable mild to moderate severity areas of scarring and/or atelectasis are seen within the bilateral lung bases. There is no evidence of a pleural effusion or pneumothorax. The heart size and mediastinal contours are within normal limits. A radiopaque fusion plate and screws are seen overlying the cervical spine. IMPRESSION: 1. Evidence of prior median sternotomy/CABG. 2. Stable bibasilar scarring and/or  atelectasis. Electronically Signed   By: Aram Candela M.D.   On: 03/31/2020 17:28     Procedures Procedures (including critical care time)  Medications Ordered in ED Medications  aspirin tablet 325 mg (has no administration in time range)    ED Course  I have reviewed the triage vital signs and the nursing notes.  Pertinent labs & imaging results that were available during my care of the patient were reviewed by me and considered in my medical decision making (see chart for details).  Clinical Course as of Apr 02 943  Tue Apr 01, 2020  0917 EKG is normal sinus rhythm, no ischemic changes, no changes when compared to previous.  Chest x-ray shows bibasilar scarring and sternotomy wires with no other acute finding.  Blood work shows mild hyponatremia with 2 - troponins.  Patient is currently chest pain-free.   [KH]    Clinical Course User Index [KH] Kennita Pavlovich, Clabe Seal, DO   MDM Rules/Calculators/A&P                          74 year old male with extensive cardiac history, complaining of angina that is now taking multiple doses of nitroglycerin for relief.  Currently he is chest pain-free.  Spoke with cardiology, last catheterization with PCI was 2018.  Plan for admit, aspirin home meds ordered. Final Clinical Impression(s) / ED Diagnoses Final diagnoses:  None    Rx / DC Orders ED Discharge Orders    None       Rozelle Logan, DO 04/01/20 1612

## 2020-04-01 NOTE — ED Notes (Signed)
Meal Tray given, pt currently eating.

## 2020-04-01 NOTE — Discharge Planning (Signed)
Pt active at [facility] MD: Floreen Comber, PA SW: Otho Perl   Pager: [phone] Desk phone: [phone]

## 2020-04-01 NOTE — ED Notes (Signed)
Pt said he used to have allergy to lisinopril. No coughing and no sign of allergy noted at this time. Notified physician. Med discontinued.

## 2020-04-02 ENCOUNTER — Encounter (HOSPITAL_COMMUNITY)
Admission: EM | Disposition: A | Discharge status: Home / Self Care | Payer: Self-pay | Source: Ambulatory Visit | Attending: Cardiovascular Disease

## 2020-04-02 ENCOUNTER — Observation Stay (HOSPITAL_COMMUNITY): Payer: No Typology Code available for payment source

## 2020-04-02 ENCOUNTER — Ambulatory Visit (HOSPITAL_COMMUNITY): Admission: RE | Admit: 2020-04-02 | Payer: Medicare Other | Source: Home / Self Care | Admitting: Cardiology

## 2020-04-02 DIAGNOSIS — E1142 Type 2 diabetes mellitus with diabetic polyneuropathy: Secondary | ICD-10-CM | POA: Diagnosis present

## 2020-04-02 DIAGNOSIS — I25710 Atherosclerosis of autologous vein coronary artery bypass graft(s) with unstable angina pectoris: Secondary | ICD-10-CM | POA: Diagnosis not present

## 2020-04-02 DIAGNOSIS — I350 Nonrheumatic aortic (valve) stenosis: Secondary | ICD-10-CM | POA: Diagnosis present

## 2020-04-02 DIAGNOSIS — I200 Unstable angina: Secondary | ICD-10-CM | POA: Diagnosis present

## 2020-04-02 DIAGNOSIS — I25110 Atherosclerotic heart disease of native coronary artery with unstable angina pectoris: Secondary | ICD-10-CM

## 2020-04-02 DIAGNOSIS — Z20822 Contact with and (suspected) exposure to covid-19: Secondary | ICD-10-CM | POA: Diagnosis present

## 2020-04-02 DIAGNOSIS — Z7982 Long term (current) use of aspirin: Secondary | ICD-10-CM | POA: Diagnosis not present

## 2020-04-02 DIAGNOSIS — R079 Chest pain, unspecified: Secondary | ICD-10-CM | POA: Diagnosis not present

## 2020-04-02 DIAGNOSIS — Z79890 Hormone replacement therapy (postmenopausal): Secondary | ICD-10-CM | POA: Diagnosis not present

## 2020-04-02 DIAGNOSIS — I10 Essential (primary) hypertension: Secondary | ICD-10-CM | POA: Diagnosis present

## 2020-04-02 DIAGNOSIS — E1151 Type 2 diabetes mellitus with diabetic peripheral angiopathy without gangrene: Secondary | ICD-10-CM | POA: Diagnosis present

## 2020-04-02 DIAGNOSIS — Z955 Presence of coronary angioplasty implant and graft: Secondary | ICD-10-CM | POA: Diagnosis not present

## 2020-04-02 DIAGNOSIS — T82855A Stenosis of coronary artery stent, initial encounter: Secondary | ICD-10-CM | POA: Diagnosis present

## 2020-04-02 DIAGNOSIS — Z87891 Personal history of nicotine dependence: Secondary | ICD-10-CM | POA: Diagnosis not present

## 2020-04-02 DIAGNOSIS — T82857A Stenosis of cardiac prosthetic devices, implants and grafts, initial encounter: Secondary | ICD-10-CM | POA: Diagnosis present

## 2020-04-02 DIAGNOSIS — E785 Hyperlipidemia, unspecified: Secondary | ICD-10-CM | POA: Diagnosis present

## 2020-04-02 DIAGNOSIS — Z7984 Long term (current) use of oral hypoglycemic drugs: Secondary | ICD-10-CM | POA: Diagnosis not present

## 2020-04-02 DIAGNOSIS — Z981 Arthrodesis status: Secondary | ICD-10-CM | POA: Diagnosis not present

## 2020-04-02 DIAGNOSIS — Z79899 Other long term (current) drug therapy: Secondary | ICD-10-CM | POA: Diagnosis not present

## 2020-04-02 DIAGNOSIS — Y831 Surgical operation with implant of artificial internal device as the cause of abnormal reaction of the patient, or of later complication, without mention of misadventure at the time of the procedure: Secondary | ICD-10-CM | POA: Diagnosis present

## 2020-04-02 DIAGNOSIS — E039 Hypothyroidism, unspecified: Secondary | ICD-10-CM | POA: Diagnosis present

## 2020-04-02 DIAGNOSIS — Z96642 Presence of left artificial hip joint: Secondary | ICD-10-CM | POA: Diagnosis present

## 2020-04-02 DIAGNOSIS — I358 Other nonrheumatic aortic valve disorders: Secondary | ICD-10-CM | POA: Diagnosis present

## 2020-04-02 DIAGNOSIS — E871 Hypo-osmolality and hyponatremia: Secondary | ICD-10-CM | POA: Diagnosis present

## 2020-04-02 DIAGNOSIS — I252 Old myocardial infarction: Secondary | ICD-10-CM | POA: Diagnosis not present

## 2020-04-02 HISTORY — PX: LEFT HEART CATH AND CORS/GRAFTS ANGIOGRAPHY: CATH118250

## 2020-04-02 HISTORY — PX: CORONARY STENT INTERVENTION: CATH118234

## 2020-04-02 LAB — ECHOCARDIOGRAM COMPLETE
AR max vel: 1 cm2
AV Area VTI: 0.89 cm2
AV Area mean vel: 0.89 cm2
AV Mean grad: 14 mmHg
AV Peak grad: 25.2 mmHg
Ao pk vel: 2.51 m/s
Area-P 1/2: 3.53 cm2
Height: 66 in
S' Lateral: 2.8 cm
Weight: 2942.4 oz

## 2020-04-02 LAB — CBC
HCT: 42.5 % (ref 39.0–52.0)
Hemoglobin: 14.4 g/dL (ref 13.0–17.0)
MCH: 28.9 pg (ref 26.0–34.0)
MCHC: 33.9 g/dL (ref 30.0–36.0)
MCV: 85.2 fL (ref 80.0–100.0)
Platelets: 221 10*3/uL (ref 150–400)
RBC: 4.99 MIL/uL (ref 4.22–5.81)
RDW: 12.8 % (ref 11.5–15.5)
WBC: 6 10*3/uL (ref 4.0–10.5)
nRBC: 0 % (ref 0.0–0.2)

## 2020-04-02 LAB — CREATININE, SERUM
Creatinine, Ser: 0.93 mg/dL (ref 0.61–1.24)
GFR, Estimated: >60 mL/min (ref >60)

## 2020-04-02 LAB — LIPID PANEL
Cholesterol: 124 mg/dL (ref 0–200)
HDL: 33 mg/dL — ABNORMAL LOW (ref >40)
LDL Cholesterol: 74 mg/dL (ref 0–99)
Total CHOL/HDL Ratio: 3.8 RATIO
Triglycerides: 85 mg/dL (ref <150)
VLDL: 17 mg/dL (ref 0–40)

## 2020-04-02 LAB — BASIC METABOLIC PANEL
Anion gap: 9 (ref 5–15)
BUN: 12 mg/dL (ref 8–23)
CO2: 21 mmol/L — ABNORMAL LOW (ref 22–32)
Calcium: 8.8 mg/dL — ABNORMAL LOW (ref 8.9–10.3)
Chloride: 99 mmol/L (ref 98–111)
Creatinine, Ser: 0.76 mg/dL (ref 0.61–1.24)
GFR, Estimated: >60 mL/min (ref >60)
Glucose, Bld: 118 mg/dL — ABNORMAL HIGH (ref 70–99)
Potassium: 4.5 mmol/L (ref 3.5–5.1)
Sodium: 129 mmol/L — ABNORMAL LOW (ref 135–145)

## 2020-04-02 LAB — TROPONIN I (HIGH SENSITIVITY): Troponin I (High Sensitivity): 22 ng/L — ABNORMAL HIGH (ref <18)

## 2020-04-02 LAB — POCT ACTIVATED CLOTTING TIME: Activated Clotting Time: 368 seconds

## 2020-04-02 SURGERY — LEFT HEART CATH AND CORS/GRAFTS ANGIOGRAPHY
Anesthesia: LOCAL

## 2020-04-02 MED ORDER — SODIUM CHLORIDE 0.9 % WEIGHT BASED INFUSION
1.0000 mL/kg/h | INTRAVENOUS | Status: DC
  Administered 2020-04-02: 1 mL/kg/h via INTRAVENOUS

## 2020-04-02 MED ORDER — HEPARIN (PORCINE) IN NACL 1000-0.9 UT/500ML-% IV SOLN
INTRAVENOUS | Status: DC | PRN
  Administered 2020-04-02 (×2): 500 mL

## 2020-04-02 MED ORDER — SODIUM CHLORIDE 0.9 % IV SOLN: 250.0000 mL | INTRAVENOUS | Status: DC | PRN

## 2020-04-02 MED ORDER — SODIUM CHLORIDE 0.9% FLUSH
3.0000 mL | Freq: Two times a day (BID) | INTRAVENOUS | Status: DC
  Administered 2020-04-03 (×2): 3 mL via INTRAVENOUS

## 2020-04-02 MED ORDER — MENTHOL 3 MG MT LOZG
1.0000 | LOZENGE | OROMUCOSAL | Status: DC | PRN
  Administered 2020-04-02: 3 mg via ORAL
  Filled 2020-04-02: qty 9

## 2020-04-02 MED ORDER — SODIUM CHLORIDE 0.9% FLUSH: 3.0000 mL | INTRAVENOUS | Status: DC | PRN

## 2020-04-02 MED ORDER — LIDOCAINE HCL (PF) 1 % IJ SOLN
INTRAMUSCULAR | Status: DC | PRN
  Administered 2020-04-02: 2 mL

## 2020-04-02 MED ORDER — HYDRALAZINE HCL 20 MG/ML IJ SOLN: 10.0000 mg | INTRAMUSCULAR | Status: AC | PRN

## 2020-04-02 MED ORDER — SODIUM CHLORIDE 0.9 % IV SOLN: INTRAVENOUS | Status: AC

## 2020-04-02 MED ORDER — SODIUM CHLORIDE 0.9 % WEIGHT BASED INFUSION
3.0000 mL/kg/h | INTRAVENOUS | Status: DC
  Administered 2020-04-02: 3 mL/kg/h via INTRAVENOUS

## 2020-04-02 MED ORDER — TICAGRELOR 90 MG PO TABS
ORAL_TABLET | ORAL | Status: DC | PRN
  Administered 2020-04-02: 180 mg via ORAL

## 2020-04-02 MED ORDER — HEPARIN SODIUM (PORCINE) 1000 UNIT/ML IJ SOLN
INTRAMUSCULAR | Status: DC | PRN
  Administered 2020-04-02 (×2): 4500 [IU] via INTRAVENOUS

## 2020-04-02 MED ORDER — TICAGRELOR 90 MG PO TABS
90.0000 mg | ORAL_TABLET | Freq: Two times a day (BID) | ORAL | Status: DC
  Administered 2020-04-02 – 2020-04-05 (×5): 90 mg via ORAL
  Filled 2020-04-02 (×5): qty 1
  Filled 2020-04-02: qty 14

## 2020-04-02 MED ORDER — ACETAMINOPHEN 325 MG PO TABS
650.0000 mg | ORAL_TABLET | ORAL | Status: DC | PRN
  Administered 2020-04-02: 650 mg via ORAL
  Filled 2020-04-02: qty 2

## 2020-04-02 MED ORDER — LABETALOL HCL 5 MG/ML IV SOLN: 10.0000 mg | INTRAVENOUS | Status: AC | PRN

## 2020-04-02 MED ORDER — NITROGLYCERIN 1 MG/10 ML FOR IR/CATH LAB
INTRA_ARTERIAL | Status: AC
  Filled 2020-04-02: qty 10

## 2020-04-02 MED ORDER — VERAPAMIL HCL 2.5 MG/ML IV SOLN
INTRAVENOUS | Status: DC | PRN
  Administered 2020-04-02: 10 mL via INTRA_ARTERIAL

## 2020-04-02 MED ORDER — FENTANYL CITRATE (PF) 100 MCG/2ML IJ SOLN
INTRAMUSCULAR | Status: DC | PRN
  Administered 2020-04-02 (×2): 25 ug via INTRAVENOUS

## 2020-04-02 MED ORDER — MIDAZOLAM HCL 2 MG/2ML IJ SOLN
INTRAMUSCULAR | Status: DC | PRN
  Administered 2020-04-02 (×2): 1 mg via INTRAVENOUS

## 2020-04-02 MED ORDER — IOHEXOL 350 MG/ML SOLN
INTRAVENOUS | Status: DC | PRN
  Administered 2020-04-02: 150 mL

## 2020-04-02 SURGICAL SUPPLY — 19 items
BALLN SAPPHIRE 2.5X12 (BALLOONS) ×2
BALLOON SAPPHIRE 2.5X12 (BALLOONS) IMPLANT
CATH INFINITI 5FR AL1 (CATHETERS) ×1 IMPLANT
CATH INFINITI 5FR MULTPACK ANG (CATHETERS) ×1 IMPLANT
CATH LAUNCHER 6FR AL.75 (CATHETERS) ×1 IMPLANT
DEVICE RAD COMP TR BAND LRG (VASCULAR PRODUCTS) ×2 IMPLANT
GLIDESHEATH SLEND SS 6F .021 (SHEATH) ×1 IMPLANT
GUIDEWIRE INQWIRE 1.5J.035X260 (WIRE) IMPLANT
INQWIRE 1.5J .035X260CM (WIRE) ×2
KIT ENCORE 26 ADVANTAGE (KITS) ×1 IMPLANT
KIT HEART LEFT (KITS) ×2 IMPLANT
PACK CARDIAC CATHETERIZATION (CUSTOM PROCEDURE TRAY) ×2 IMPLANT
SHEATH PROBE COVER 6X72 (BAG) ×1 IMPLANT
STENT RESOLUTE ONYX 3.0X12 (Permanent Stent) ×1 IMPLANT
STENT RESOLUTE ONYX 3.0X15 (Permanent Stent) ×1 IMPLANT
STENT RESOLUTE ONYX 3.0X22 (Permanent Stent) ×1 IMPLANT
TRANSDUCER W/STOPCOCK (MISCELLANEOUS) ×2 IMPLANT
TUBING CIL FLEX 10 FLL-RA (TUBING) ×2 IMPLANT
WIRE ASAHI PROWATER 180CM (WIRE) ×1 IMPLANT

## 2020-04-02 NOTE — Interval H&P Note (Signed)
History and Physical Interval Note:  04/02/2020 10:35 AM  Luke Harvey  has presented today for surgery, with the diagnosis of unstable angina.  The various methods of treatment have been discussed with the patient and family. After consideration of risks, benefits and other options for treatment, the patient has consented to  Procedure(s): LEFT HEART CATH AND CORS/GRAFTS ANGIOGRAPHY (N/A)  PERCUTANEOUS CORONARY INTERVENTION  as a surgical intervention.  The patient's history has been reviewed, patient examined, no change in status, stable for surgery.  I have reviewed the patient's chart and labs.  Questions were answered to the patient's satisfaction.    Cath Lab Visit (complete for each Cath Lab visit)  Clinical Evaluation Leading to the Procedure:   ACS: Yes.    Non-ACS:    Anginal Classification: CCS III  Anti-ischemic medical therapy: Minimal Therapy (1 class of medications)  Non-Invasive Test Results: No non-invasive testing performed  Prior CABG: Previous CABG   Bryan Lemma

## 2020-04-02 NOTE — Plan of Care (Signed)
  Problem: Education: Goal: Knowledge of General Education information will improve Description: Including pain rating scale, medication(s)/side effects and non-pharmacologic comfort measures Outcome: Progressing   Problem: Health Behavior/Discharge Planning: Goal: Ability to manage health-related needs will improve Outcome: Progressing   Problem: Clinical Measurements: Goal: Ability to maintain clinical measurements within normal limits will improve Outcome: Progressing Goal: Will remain free from infection Outcome: Progressing Goal: Diagnostic test results will improve Outcome: Progressing Goal: Respiratory complications will improve Outcome: Progressing Goal: Cardiovascular complication will be avoided Outcome: Progressing   Problem: Education: Goal: Knowledge of General Education information will improve Description: Including pain rating scale, medication(s)/side effects and non-pharmacologic comfort measures Outcome: Progressing   Problem: Health Behavior/Discharge Planning: Goal: Ability to manage health-related needs will improve Outcome: Progressing   Problem: Clinical Measurements: Goal: Ability to maintain clinical measurements within normal limits will improve Outcome: Progressing Goal: Will remain free from infection Outcome: Progressing Goal: Diagnostic test results will improve Outcome: Progressing Goal: Respiratory complications will improve Outcome: Progressing Goal: Cardiovascular complication will be avoided Outcome: Progressing   Problem: Nutrition: Goal: Adequate nutrition will be maintained Outcome: Progressing   Problem: Coping: Goal: Level of anxiety will decrease Outcome: Progressing   Problem: Elimination: Goal: Will not experience complications related to bowel motility Outcome: Progressing Goal: Will not experience complications related to urinary retention Outcome: Progressing   Problem: Pain Managment: Goal: General experience of  comfort will improve Outcome: Progressing   Problem: Safety: Goal: Ability to remain free from injury will improve Outcome: Progressing   Problem: Skin Integrity: Goal: Risk for impaired skin integrity will decrease Outcome: Progressing   Problem: Education: Goal: Understanding of CV disease, CV risk reduction, and recovery process will improve Outcome: Progressing Goal: Individualized Educational Video(s) Outcome: Progressing   Problem: Activity: Goal: Ability to return to baseline activity level will improve Outcome: Progressing   Problem: Cardiovascular: Goal: Ability to achieve and maintain adequate cardiovascular perfusion will improve Outcome: Progressing Goal: Vascular access site(s) Level 0-1 will be maintained Outcome: Progressing   Problem: Health Behavior/Discharge Planning: Goal: Ability to safely manage health-related needs after discharge will improve Outcome: Progressing   Problem: Education: Goal: Ability to demonstrate management of disease process will improve Outcome: Progressing Goal: Ability to verbalize understanding of medication therapies will improve Outcome: Progressing   Problem: Activity: Goal: Capacity to carry out activities will improve Outcome: Progressing   Problem: Cardiac: Goal: Ability to achieve and maintain adequate cardiopulmonary perfusion will improve Outcome: Progressing

## 2020-04-02 NOTE — Progress Notes (Signed)
  Echocardiogram 2D Echocardiogram has been performed.  Luke Harvey 04/02/2020, 9:46 AM

## 2020-04-03 ENCOUNTER — Encounter (HOSPITAL_COMMUNITY): Payer: Self-pay | Admitting: Cardiology

## 2020-04-03 DIAGNOSIS — I200 Unstable angina: Secondary | ICD-10-CM | POA: Diagnosis not present

## 2020-04-03 LAB — BASIC METABOLIC PANEL
Anion gap: 10 (ref 5–15)
BUN: 13 mg/dL (ref 8–23)
CO2: 20 mmol/L — ABNORMAL LOW (ref 22–32)
Calcium: 8.7 mg/dL — ABNORMAL LOW (ref 8.9–10.3)
Chloride: 99 mmol/L (ref 98–111)
Creatinine, Ser: 1.07 mg/dL (ref 0.61–1.24)
GFR, Estimated: >60 mL/min (ref >60)
Glucose, Bld: 165 mg/dL — ABNORMAL HIGH (ref 70–99)
Potassium: 4.2 mmol/L (ref 3.5–5.1)
Sodium: 129 mmol/L — ABNORMAL LOW (ref 135–145)

## 2020-04-03 LAB — CBC
HCT: 35.5 % — ABNORMAL LOW (ref 39.0–52.0)
Hemoglobin: 12.6 g/dL — ABNORMAL LOW (ref 13.0–17.0)
MCH: 29.4 pg (ref 26.0–34.0)
MCHC: 35.5 g/dL (ref 30.0–36.0)
MCV: 82.8 fL (ref 80.0–100.0)
Platelets: 195 10*3/uL (ref 150–400)
RBC: 4.29 MIL/uL (ref 4.22–5.81)
RDW: 13.1 % (ref 11.5–15.5)
WBC: 4.4 10*3/uL (ref 4.0–10.5)
nRBC: 0 % (ref 0.0–0.2)

## 2020-04-03 LAB — GLUCOSE, CAPILLARY
Glucose-Capillary: 227 mg/dL — ABNORMAL HIGH (ref 70–99)
Glucose-Capillary: 175 mg/dL — ABNORMAL HIGH (ref 70–99)

## 2020-04-03 MED ORDER — ASPIRIN 81 MG PO CHEW: 81.0000 mg | CHEWABLE_TABLET | ORAL | Status: AC

## 2020-04-03 MED ORDER — INSULIN ASPART 100 UNIT/ML ~~LOC~~ SOLN: 0.0000 [IU] | Freq: Three times a day (TID) | SUBCUTANEOUS | Status: DC

## 2020-04-03 MED ORDER — SODIUM CHLORIDE 0.9 % IV SOLN: 250.0000 mL | INTRAVENOUS | Status: DC | PRN

## 2020-04-03 MED ORDER — SODIUM CHLORIDE 0.9% FLUSH: 3.0000 mL | Freq: Two times a day (BID) | INTRAVENOUS | Status: DC

## 2020-04-03 MED ORDER — SODIUM CHLORIDE 0.9 % WEIGHT BASED INFUSION: 1.0000 mL/kg/h | INTRAVENOUS | Status: DC

## 2020-04-03 MED ORDER — SODIUM CHLORIDE 0.9% FLUSH: 3.0000 mL | INTRAVENOUS | Status: DC | PRN

## 2020-04-03 MED ORDER — SODIUM CHLORIDE 0.9 % WEIGHT BASED INFUSION
3.0000 mL/kg/h | INTRAVENOUS | Status: DC
  Administered 2020-04-04: 3 mL/kg/h via INTRAVENOUS

## 2020-04-03 MED ORDER — INSULIN ASPART 100 UNIT/ML ~~LOC~~ SOLN: 0.0000 [IU] | Freq: Every day | SUBCUTANEOUS | Status: DC

## 2020-04-03 NOTE — Progress Notes (Signed)
CARDIAC REHAB PHASE I   PRE:  Rate/Rhythm: 70 SR  BP:  Supine: 111/32  leg  Sitting:  Standing:    SaO2: 96%RA  MODE:  Ambulation: 240 ft   POST:  Rate/Rhythm: 91 SR  BP:  Supine: 113/81  leg  Sitting:   Standing:    SaO2: 97%RA 0915-1030 Pt walked 240 ft on RA with steady gait. No CP. Stated his legs hurt with walking so he does not walk much. To bed after walk. Discussed the importance of brilinta with stent. Reviewed NTG use, heart healthy and low carb food choices, and CRP 2. Pt stated he attended after CABG but his legs bother him now and he sees Vascular doctor. Stressed importance of brilinta several times. May need reviewing again in next visits.  Referred to [place] CRP 2.   Luetta Nutting, RN BSN  04/03/2020 10:29 AM

## 2020-04-03 NOTE — Progress Notes (Signed)
Progress Note  Patient Name: Luke Harvey Date of Encounter: 04/03/2020  [facility] Cardiologist: Lance Muss, MD   Subjective   No chest pain  Inpatient Medications    Scheduled Meds: . aspirin EC  81 mg Oral Daily  . citalopram  20 mg Oral Daily  . heparin  5,000 Units Subcutaneous Q8H  . metoprolol tartrate  25 mg Oral BID  . rosuvastatin  20 mg Oral Daily  . sodium chloride flush  3 mL Intravenous Q12H  . sodium chloride flush  3 mL Intravenous Q12H  . ticagrelor  90 mg Oral BID   Continuous Infusions: . sodium chloride     PRN Meds: sodium chloride, acetaminophen, ALPRAZolam, menthol-cetylpyridinium, nitroGLYCERIN, ondansetron (ZOFRAN) IV, sodium chloride flush   Vital Signs    Vitals:   04/02/20 1957 04/03/20 0016 04/03/20 0337 04/03/20 0705  BP: 132/86 117/75 111/78 128/62  Pulse:  70 (!) 55 65  Resp:  17 16 15   Temp: 97.6 F (36.4 C) 98.6 F (37 C) 98.7 F (37.1 C) 97.6 F (36.4 C)  TempSrc: Oral Oral Oral Oral  SpO2:  96% 93% 97%  Weight:      Height:        Intake/Output Summary (Last 24 hours) at 04/03/2020 0756 Last data filed at 04/03/2020 0626 Gross per 24 hour  Intake 240 ml  Output 1400 ml  Net -1160 ml   Last 3 Weights 04/02/2020 04/01/2020 06/09/2016  Weight (lbs) 183 lb 14.4 oz 185 lb 189 lb  Weight (kg) 83.416 kg 83.915 kg 85.73 kg      Telemetry    Sinus - Personally Reviewed  ECG    Sinus rhythm, poor R wave progression precordial leads- Personally Reviewed  Physical Exam   GEN: No acute distress.   Neck: No JVD Cardiac: RRR, no murmurs, rubs, or gallops.  Respiratory: Clear to auscultation bilaterally. GI: Soft, nontender, non-distended  MS: No edema; No deformity. Neuro:  Nonfocal  Psych: Normal affect   Labs    High Sensitivity Troponin:   Recent Labs  Lab 03/31/20 1647 03/31/20 2044 04/01/20 1754 04/02/20 0022  TROPONINIHS 14 13 11  22*      Chemistry Recent Labs  Lab 03/31/20 1647  04/02/20 0022 04/02/20 0740 04/03/20 0004  NA 131*  --  129* 129*  K 4.1  --  4.5 4.2  CL 98  --  99 99  CO2 24  --  21* 20*  GLUCOSE 100*  --  118* 165*  BUN 9  --  12 13  CREATININE 1.02 0.93 0.76 1.07  CALCIUM 8.8*  --  8.8* 8.7*  GFRNONAA >60 >60 >60 >60  ANIONGAP 9  --  9 10     Hematology Recent Labs  Lab 03/31/20 1647 04/02/20 0022 04/03/20 0004  WBC 5.6 6.0 4.4  RBC 4.18* 4.99 4.29  HGB 12.1* 14.4 12.6*  HCT 36.2* 42.5 35.5*  MCV 86.6 85.2 82.8  MCH 28.9 28.9 29.4  MCHC 33.4 33.9 35.5  RDW 13.0 12.8 13.1  PLT 219 221 195    BNPNo results for input(s): BNP, PROBNP in the last 168 hours.   DDimer No results for input(s): DDIMER in the last 168 hours.   Radiology    CARDIAC CATHETERIZATION  Result Date: 04/02/2020  There is mild left ventricular systolic dysfunction.  There is no aortic valve stenosis.  There is no mitral valve regurgitation.  Mid LM to Prox LAD lesion is 95% stenosed with 90% stenosed  side branch in Ost Cx to Prox Cx.  Prox LAD to Mid LAD lesion is 100% stenosed with 99% stenosed side branch in 2nd Diag.  2nd Mrg lesion is 90% stenosed.  Prox RCA lesion is 80% stenosed.  Mid RCA to Dist RCA lesion is 90% stenosed.  LIMA graft was visualized by angiography and is very large. The graft exhibits no disease.  -----  SVG graft was visualized by angiography and is large.  Lesion #2: Origin lesion is 90% stenosed.  A drug-eluting stent was successfully placed using a STENT RESOLUTE ONYX 3.0X15.  Post intervention, there is a 0% residual stenosis. However the next lesion appeared to be more significant  Lesion #3: Prox Graft lesion is 70% stenosed.  A drug-eluting stent was successfully placed overlapping the ostial stent, using a STENT RESOLUTE ONYX 3.0X12. Postdilated to 3.3 mm  Post intervention, there is a 0% residual stenosis  Lesion #1: Mid Graft to Dist Graft lesion is 99% stenosed.  A drug-eluting stent was successfully placed using a  STENT RESOLUTE ONYX 3.0X22. Postdilated to 3.3 mm  Post intervention, there is a 0% residual stenosis.  SUMMARY  SEVERE NATIVE VESSEL CAD:  Persistent severe LEFT MAIN trifurcation 99% stenosis with occluded LAD, and small caliber ramus intermedius as well as OM1.   LCx gives off a major (grafted) OM 2 branch that now has actually antegrade flow beyond a 90% stenosis.  Severe heavily calcified mid RCA 80% eccentric lesion proximal to stent over stent in the mid to distal vessel that has a focal area of 95 to 99% stenosis (appears to potentially be stent strut fracture -> that was noted following last PCI in 2018)  Widely patent LIMA-LAD with diffuse small LAD downstream.  SVG-OM has near ostial 90% stenosis followed by focal area of 70% (NOTED worse following stenting of the ostial segment), and then mid graft 99% lesion (culprit lesion)  Borderline elevated LVEDP of roughly 16 mmHg - (no LV Gram) RECOMMENDATIONS  For now we will plan to monitor the patient post-cath and have discussed gust staged evaluation and PCI of the RCA.  This will likely require atherectomy of the more proximal-mid lesion, followed by IVUS of the in-stent restenosis segment in order to understand the pathology to properly treat (especially since this would be placing a third stent)  Patient was reloaded on Brilinta.  Continue risk factor modification. David Harding, MD  ECHOCARDIOGRAM COMPLETE  Result Date: 04/02/2020    ECHOCARDIOGRAM REPORT   Patient Name:   Luke Harvey Date of Exam: 04/02/2020 Medical Rec #:  [id number]        Height:       66.0 in Accession #:    [id number]       Weight:       183.9 lb Date of Birth:  03/13/1946        BSA:          1.930 m Patient Age:    74 years         BP:           124/68 mmHg Patient Gender: M                HR:           62 bpm. Exam Location:  Inpatient Procedure: 2D Echo Indications:    chest pain 786.50  History:        Patient has no prior history of Echocardiogram  examinations.                   Prior CABG, Signs/Symptoms:Chest Pain; Risk Factors:Dyslipidemia                 and Diabetes.  Sonographer:    Delcie Roch Referring Phys: [id number] LINDSAY B ROBERTS IMPRESSIONS  1. Left ventricular ejection fraction, by estimation, is 60 to 65%. The left ventricle has normal function. The left ventricle has no regional wall motion abnormalities. Left ventricular diastolic parameters are indeterminate.  2. Right ventricular systolic function is normal. The right ventricular size is normal. Tricuspid regurgitation signal is inadequate for assessing PA pressure.  3. The mitral valve is normal in structure. Trivial mitral valve regurgitation.  4. The aortic valve is calcified. There is moderate calcification of the aortic valve. Aortic valve regurgitation is trivial. Mild to moderate aortic valve stenosis. Mild AS by gradients (Vmax 2.5 m/s, MG ), but low SV (SVi 31 cc/m^2) and [name] (0.9  cm^2) and DI (0.31) consistent with moderate AS. Suspect moderate AS.  5. The inferior vena cava is normal in size with greater than 50% respiratory variability, suggesting right atrial pressure of 3 mmHg. FINDINGS  Left Ventricle: Left ventricular ejection fraction, by estimation, is 60 to 65%. The left ventricle has normal function. The left ventricle has no regional wall motion abnormalities. The left ventricular internal cavity size was normal in size. There is  no left ventricular hypertrophy. Left ventricular diastolic parameters are indeterminate. Right Ventricle: The right ventricular size is normal. Right vetricular wall thickness was not assessed. Right ventricular systolic function is normal. Tricuspid regurgitation signal is inadequate for assessing PA pressure. Left Atrium: Left atrial size was normal in size. Right Atrium: Right atrial size was normal in size. Pericardium: There is no evidence of pericardial effusion. Mitral Valve: The mitral valve is normal in structure. Trivial  mitral valve regurgitation. Tricuspid Valve: The tricuspid valve is normal in structure. Tricuspid valve regurgitation is trivial. Aortic Valve: The aortic valve is calcified. There is moderate calcification of the aortic valve. Aortic valve regurgitation is trivial. Mild to moderate aortic stenosis is present. Aortic valve mean gradient measures 14.0 mmHg. Aortic valve peak gradient measures 25.2 mmHg. Aortic valve area, by VTI measures 0.89 cm. Pulmonic Valve: The pulmonic valve was not well visualized. Pulmonic valve regurgitation is not visualized. Aorta: The aortic root and ascending aorta are structurally normal, with no evidence of dilitation. Venous: The inferior vena cava is normal in size with greater than 50% respiratory variability, suggesting right atrial pressure of 3 mmHg. IAS/Shunts: The interatrial septum was not well visualized.  LEFT VENTRICLE PLAX 2D LVIDd:         4.30 cm  Diastology LVIDs:         2.80 cm  LV e' medial:    5.44 cm/s LV PW:         1.00 cm  LV E/e' medial:  20.6 LV IVS:        1.00 cm  LV e' lateral:   6.42 cm/s LVOT diam:     1.90 cm  LV E/e' lateral: 17.4 LV SV:         58 LV SV Index:   30 LVOT Area:     2.84 cm  RIGHT VENTRICLE             IVC RV S prime:     10.70 cm/s  IVC diam: 1.40 cm TAPSE (M-mode): 1.8 cm LEFT ATRIUM             Index       RIGHT  ATRIUM           Index LA diam:        3.60 cm 1.87 cm/m  RA Area:     13.00 cm LA Vol (A2C):   57.5 ml 29.80 ml/m RA Volume:   31.30 ml  16.22 ml/m LA Vol (A4C):   48.1 ml 24.93 ml/m LA Biplane Vol: 52.5 ml 27.21 ml/m  AORTIC VALVE AV Area (Vmax):    1.00 cm AV Area (Vmean):   0.89 cm AV Area (VTI):     0.89 cm AV Vmax:           251.00 cm/s AV Vmean:          179.000 cm/s AV VTI:            0.649 m AV Peak Grad:      25.2 mmHg AV Mean Grad:      14.0 mmHg LVOT Vmax:         88.60 cm/s LVOT Vmean:        56.300 cm/s LVOT VTI:          0.203 m LVOT/AV VTI ratio: 0.31  AORTA Ao Root diam: 3.10 cm Ao Asc diam:  3.40  cm MITRAL VALVE MV Area (PHT): 3.53 cm     SHUNTS MV Decel Time: 215 msec     Systemic VTI:  0.20 m MV E velocity: 112.00 cm/s  Systemic Diam: 1.90 cm MV A velocity: 122.00 cm/s MV E/A ratio:  0.92 Epifanio Lesches MD Electronically signed by Epifanio Lesches MD Signature Date/Time: 04/02/2020/11:02:49 AM    Final     Cardiac Studies     Patient Profile     74 y.o. male with history of CAD s/p 2V CABG in 2014 with subsequent PCI in 2015 and 2018, DM, HTN, HLD, prior tobacco abuse, PAD and hypothyroidism who was admitted with chest pain concerning for unstable angina. 2V CABG in [place] in 2014. Stent placement in RCA in 2015. Admitted to [facility] February 2018 with a NSTEMI. Severe restenosis mid RCA stent in 2018 treated with a drug eluting stent. Cardiac cath 04/02/20 with severe vein graft stenoses treated with 2 drug eluting stents. Residual high grade disease in the RCA.   Assessment & Plan    1. CAD with unstable angina: Cardiac cath yesterday with severe disease in the ostium and mid body of the vein graft to the OM treated with drug eluting stents. Residual high grade, heavily calcified disease in the mid RCA and restenosis vs stent fracture in the distal RCA stent. Doing well this am. No chest pain. Plans for staged PCI of the mid RCA tomorrow with orbital atherectomy and likely IVUS of the previously stented area more distally to evaluate for stent fracture vs restenosis.  -Continue DAPT with ASA and Brilinta for at least six months -Continue beta blocker and statin.  -NPO at midnight for PCI tomorrow morning with Dr. Tresa Endo.   For questions or updates, please contact [facility] Please consult [email or web address] for contact info under        Signed, Verne Carrow, MD  04/03/2020, 7:56 AM

## 2020-04-03 NOTE — H&P (View-Only) (Signed)
Progress Note  Patient Name: Luke Harvey Date of Encounter: 04/03/2020  [facility] Cardiologist: Lance Muss, MD   Subjective   No chest pain  Inpatient Medications    Scheduled Meds: . aspirin EC  81 mg Oral Daily  . citalopram  20 mg Oral Daily  . heparin  5,000 Units Subcutaneous Q8H  . metoprolol tartrate  25 mg Oral BID  . rosuvastatin  20 mg Oral Daily  . sodium chloride flush  3 mL Intravenous Q12H  . sodium chloride flush  3 mL Intravenous Q12H  . ticagrelor  90 mg Oral BID   Continuous Infusions: . sodium chloride     PRN Meds: sodium chloride, acetaminophen, ALPRAZolam, menthol-cetylpyridinium, nitroGLYCERIN, ondansetron (ZOFRAN) IV, sodium chloride flush   Vital Signs    Vitals:   04/02/20 1957 04/03/20 0016 04/03/20 0337 04/03/20 0705  BP: 132/86 117/75 111/78 128/62  Pulse:  70 (!) 55 65  Resp:  17 16 15   Temp: 97.6 F (36.4 C) 98.6 F (37 C) 98.7 F (37.1 C) 97.6 F (36.4 C)  TempSrc: Oral Oral Oral Oral  SpO2:  96% 93% 97%  Weight:      Height:        Intake/Output Summary (Last 24 hours) at 04/03/2020 0756 Last data filed at 04/03/2020 0626 Gross per 24 hour  Intake 240 ml  Output 1400 ml  Net -1160 ml   Last 3 Weights 04/02/2020 04/01/2020 06/09/2016  Weight (lbs) 183 lb 14.4 oz 185 lb 189 lb  Weight (kg) 83.416 kg 83.915 kg 85.73 kg      Telemetry    Sinus - Personally Reviewed  ECG    Sinus rhythm, poor R wave progression precordial leads- Personally Reviewed  Physical Exam   GEN: No acute distress.   Neck: No JVD Cardiac: RRR, no murmurs, rubs, or gallops.  Respiratory: Clear to auscultation bilaterally. GI: Soft, nontender, non-distended  MS: No edema; No deformity. Neuro:  Nonfocal  Psych: Normal affect   Labs    High Sensitivity Troponin:   Recent Labs  Lab 03/31/20 1647 03/31/20 2044 04/01/20 1754 04/02/20 0022  TROPONINIHS 14 13 11  22*      Chemistry Recent Labs  Lab 03/31/20 1647  04/02/20 0022 04/02/20 0740 04/03/20 0004  NA 131*  --  129* 129*  K 4.1  --  4.5 4.2  CL 98  --  99 99  CO2 24  --  21* 20*  GLUCOSE 100*  --  118* 165*  BUN 9  --  12 13  CREATININE 1.02 0.93 0.76 1.07  CALCIUM 8.8*  --  8.8* 8.7*  GFRNONAA >60 >60 >60 >60  ANIONGAP 9  --  9 10     Hematology Recent Labs  Lab 03/31/20 1647 04/02/20 0022 04/03/20 0004  WBC 5.6 6.0 4.4  RBC 4.18* 4.99 4.29  HGB 12.1* 14.4 12.6*  HCT 36.2* 42.5 35.5*  MCV 86.6 85.2 82.8  MCH 28.9 28.9 29.4  MCHC 33.4 33.9 35.5  RDW 13.0 12.8 13.1  PLT 219 221 195    BNPNo results for input(s): BNP, PROBNP in the last 168 hours.   DDimer No results for input(s): DDIMER in the last 168 hours.   Radiology    CARDIAC CATHETERIZATION  Result Date: 04/02/2020  There is mild left ventricular systolic dysfunction.  There is no aortic valve stenosis.  There is no mitral valve regurgitation.  Mid LM to Prox LAD lesion is 95% stenosed with 90% stenosed  side branch in Ost Cx to Prox Cx.  Prox LAD to Mid LAD lesion is 100% stenosed with 99% stenosed side branch in 2nd Diag.  2nd Mrg lesion is 90% stenosed.  Prox RCA lesion is 80% stenosed.  Mid RCA to Dist RCA lesion is 90% stenosed.  LIMA graft was visualized by angiography and is very large. The graft exhibits no disease.  -----  SVG graft was visualized by angiography and is large.  Lesion #2: Origin lesion is 90% stenosed.  A drug-eluting stent was successfully placed using a STENT RESOLUTE ONYX 3.0X15.  Post intervention, there is a 0% residual stenosis. However the next lesion appeared to be more significant  Lesion #3: Prox Graft lesion is 70% stenosed.  A drug-eluting stent was successfully placed overlapping the ostial stent, using a STENT RESOLUTE ONYX 3.0X12. Postdilated to 3.3 mm  Post intervention, there is a 0% residual stenosis  Lesion #1: Mid Graft to Dist Graft lesion is 99% stenosed.  A drug-eluting stent was successfully placed using a  STENT RESOLUTE ONYX 3.0X22. Postdilated to 3.3 mm  Post intervention, there is a 0% residual stenosis.  SUMMARY  SEVERE NATIVE VESSEL CAD:  Persistent severe LEFT MAIN trifurcation 99% stenosis with occluded LAD, and small caliber ramus intermedius as well as OM1.   LCx gives off a major (grafted) OM 2 branch that now has actually antegrade flow beyond a 90% stenosis.  Severe heavily calcified mid RCA 80% eccentric lesion proximal to stent over stent in the mid to distal vessel that has a focal area of 95 to 99% stenosis (appears to potentially be stent strut fracture -> that was noted following last PCI in 2018)  Widely patent LIMA-LAD with diffuse small LAD downstream.  SVG-OM has near ostial 90% stenosis followed by focal area of 70% (NOTED worse following stenting of the ostial segment), and then mid graft 99% lesion (culprit lesion)  Borderline elevated LVEDP of roughly 16 mmHg - (no LV Gram) RECOMMENDATIONS  For now we will plan to monitor the patient post-cath and have discussed gust staged evaluation and PCI of the RCA.  This will likely require atherectomy of the more proximal-mid lesion, followed by IVUS of the in-stent restenosis segment in order to understand the pathology to properly treat (especially since this would be placing a third stent)  Patient was reloaded on Brilinta.  Continue risk factor modification. Bryan Lemmaavid Harding, MD  ECHOCARDIOGRAM COMPLETE  Result Date: 04/02/2020    ECHOCARDIOGRAM REPORT   Patient Name:   Luke Harvey, Luke Harvey Date of Exam: 04/02/2020 Medical Rec #:  [id number]        Height:       66.0 in Accession #:    [id number]       Weight:       183.9 lb Date of Birth:  04/11/1946        BSA:          1.930 m Patient Age:    74 years         BP:           124/68 mmHg Patient Gender: M                HR:           62 bpm. Exam Location:  Inpatient Procedure: 2D Echo Indications:    chest pain 786.50  History:        Patient has no prior history of Echocardiogram  examinations.  Prior CABG, Signs/Symptoms:Chest Pain; Risk Factors:Dyslipidemia                 and Diabetes.  Sonographer:    Delcie Roch Referring Phys: [id number] LINDSAY B ROBERTS IMPRESSIONS  1. Left ventricular ejection fraction, by estimation, is 60 to 65%. The left ventricle has normal function. The left ventricle has no regional wall motion abnormalities. Left ventricular diastolic parameters are indeterminate.  2. Right ventricular systolic function is normal. The right ventricular size is normal. Tricuspid regurgitation signal is inadequate for assessing PA pressure.  3. The mitral valve is normal in structure. Trivial mitral valve regurgitation.  4. The aortic valve is calcified. There is moderate calcification of the aortic valve. Aortic valve regurgitation is trivial. Mild to moderate aortic valve stenosis. Mild AS by gradients (Vmax 2.5 m/s, MG ), but low SV (SVi 31 cc/m^2) and [name] (0.9  cm^2) and DI (0.31) consistent with moderate AS. Suspect moderate AS.  5. The inferior vena cava is normal in size with greater than 50% respiratory variability, suggesting right atrial pressure of 3 mmHg. FINDINGS  Left Ventricle: Left ventricular ejection fraction, by estimation, is 60 to 65%. The left ventricle has normal function. The left ventricle has no regional wall motion abnormalities. The left ventricular internal cavity size was normal in size. There is  no left ventricular hypertrophy. Left ventricular diastolic parameters are indeterminate. Right Ventricle: The right ventricular size is normal. Right vetricular wall thickness was not assessed. Right ventricular systolic function is normal. Tricuspid regurgitation signal is inadequate for assessing PA pressure. Left Atrium: Left atrial size was normal in size. Right Atrium: Right atrial size was normal in size. Pericardium: There is no evidence of pericardial effusion. Mitral Valve: The mitral valve is normal in structure. Trivial  mitral valve regurgitation. Tricuspid Valve: The tricuspid valve is normal in structure. Tricuspid valve regurgitation is trivial. Aortic Valve: The aortic valve is calcified. There is moderate calcification of the aortic valve. Aortic valve regurgitation is trivial. Mild to moderate aortic stenosis is present. Aortic valve mean gradient measures 14.0 mmHg. Aortic valve peak gradient measures 25.2 mmHg. Aortic valve area, by VTI measures 0.89 cm. Pulmonic Valve: The pulmonic valve was not well visualized. Pulmonic valve regurgitation is not visualized. Aorta: The aortic root and ascending aorta are structurally normal, with no evidence of dilitation. Venous: The inferior vena cava is normal in size with greater than 50% respiratory variability, suggesting right atrial pressure of 3 mmHg. IAS/Shunts: The interatrial septum was not well visualized.  LEFT VENTRICLE PLAX 2D LVIDd:         4.30 cm  Diastology LVIDs:         2.80 cm  LV e' medial:    5.44 cm/s LV PW:         1.00 cm  LV E/e' medial:  20.6 LV IVS:        1.00 cm  LV e' lateral:   6.42 cm/s LVOT diam:     1.90 cm  LV E/e' lateral: 17.4 LV SV:         58 LV SV Index:   30 LVOT Area:     2.84 cm  RIGHT VENTRICLE             IVC RV S prime:     10.70 cm/s  IVC diam: 1.40 cm TAPSE (M-mode): 1.8 cm LEFT ATRIUM             Index       RIGHT  ATRIUM           Index LA diam:        3.60 cm 1.87 cm/m  RA Area:     13.00 cm LA Vol (A2C):   57.5 ml 29.80 ml/m RA Volume:   31.30 ml  16.22 ml/m LA Vol (A4C):   48.1 ml 24.93 ml/m LA Biplane Vol: 52.5 ml 27.21 ml/m  AORTIC VALVE AV Area (Vmax):    1.00 cm AV Area (Vmean):   0.89 cm AV Area (VTI):     0.89 cm AV Vmax:           251.00 cm/s AV Vmean:          179.000 cm/s AV VTI:            0.649 m AV Peak Grad:      25.2 mmHg AV Mean Grad:      14.0 mmHg LVOT Vmax:         88.60 cm/s LVOT Vmean:        56.300 cm/s LVOT VTI:          0.203 m LVOT/AV VTI ratio: 0.31  AORTA Ao Root diam: 3.10 cm Ao Asc diam:  3.40  cm MITRAL VALVE MV Area (PHT): 3.53 cm     SHUNTS MV Decel Time: 215 msec     Systemic VTI:  0.20 m MV E velocity: 112.00 cm/s  Systemic Diam: 1.90 cm MV A velocity: 122.00 cm/s MV E/A ratio:  0.92 Epifanio Lesches MD Electronically signed by Epifanio Lesches MD Signature Date/Time: 04/02/2020/11:02:49 AM    Final     Cardiac Studies     Patient Profile     74 y.o. male with history of CAD s/p 2V CABG in 2014 with subsequent PCI in 2015 and 2018, DM, HTN, HLD, prior tobacco abuse, PAD and hypothyroidism who was admitted with chest pain concerning for unstable angina. 2V CABG in [place] in 2014. Stent placement in RCA in 2015. Admitted to [facility] February 2018 with a NSTEMI. Severe restenosis mid RCA stent in 2018 treated with a drug eluting stent. Cardiac cath 04/02/20 with severe vein graft stenoses treated with 2 drug eluting stents. Residual high grade disease in the RCA.   Assessment & Plan    1. CAD with unstable angina: Cardiac cath yesterday with severe disease in the ostium and mid body of the vein graft to the OM treated with drug eluting stents. Residual high grade, heavily calcified disease in the mid RCA and restenosis vs stent fracture in the distal RCA stent. Doing well this am. No chest pain. Plans for staged PCI of the mid RCA tomorrow with orbital atherectomy and likely IVUS of the previously stented area more distally to evaluate for stent fracture vs restenosis.  -Continue DAPT with ASA and Brilinta for at least six months -Continue beta blocker and statin.  -NPO at midnight for PCI tomorrow morning with Dr. Tresa Endo.   For questions or updates, please contact [facility] Please consult [email or web address] for contact info under        Signed, Verne Carrow, MD  04/03/2020, 7:56 AM

## 2020-04-04 ENCOUNTER — Encounter (HOSPITAL_COMMUNITY)
Admission: EM | Disposition: A | Discharge status: Home / Self Care | Payer: Self-pay | Source: Ambulatory Visit | Attending: Cardiovascular Disease

## 2020-04-04 DIAGNOSIS — I25110 Atherosclerotic heart disease of native coronary artery with unstable angina pectoris: Secondary | ICD-10-CM

## 2020-04-04 HISTORY — PX: CORONARY ATHERECTOMY: CATH118238

## 2020-04-04 HISTORY — PX: INTRAVASCULAR ULTRASOUND/IVUS: CATH118244

## 2020-04-04 HISTORY — PX: CORONARY STENT INTERVENTION: CATH118234

## 2020-04-04 HISTORY — PX: TEMPORARY PACEMAKER: CATH118268

## 2020-04-04 LAB — POCT ACTIVATED CLOTTING TIME
Activated Clotting Time: 285 seconds
Activated Clotting Time: 470 seconds
Activated Clotting Time: 523 seconds
Activated Clotting Time: 303 seconds
Activated Clotting Time: 321 seconds
Activated Clotting Time: 345 seconds
Activated Clotting Time: 196 seconds
Activated Clotting Time: 178 seconds

## 2020-04-04 LAB — CBC
HCT: 38.6 % — ABNORMAL LOW (ref 39.0–52.0)
Hemoglobin: 13.1 g/dL (ref 13.0–17.0)
MCH: 28.5 pg (ref 26.0–34.0)
MCHC: 33.9 g/dL (ref 30.0–36.0)
MCV: 84.1 fL (ref 80.0–100.0)
Platelets: 200 10*3/uL (ref 150–400)
RBC: 4.59 MIL/uL (ref 4.22–5.81)
RDW: 12.7 % (ref 11.5–15.5)
WBC: 5.8 10*3/uL (ref 4.0–10.5)
nRBC: 0 % (ref 0.0–0.2)

## 2020-04-04 LAB — CREATININE, SERUM
Creatinine, Ser: 1.04 mg/dL (ref 0.61–1.24)
GFR, Estimated: >60 mL/min (ref >60)

## 2020-04-04 LAB — BASIC METABOLIC PANEL
Anion gap: 8 (ref 5–15)
BUN: 14 mg/dL (ref 8–23)
CO2: 21 mmol/L — ABNORMAL LOW (ref 22–32)
Calcium: 8.7 mg/dL — ABNORMAL LOW (ref 8.9–10.3)
Chloride: 99 mmol/L (ref 98–111)
Creatinine, Ser: 0.97 mg/dL (ref 0.61–1.24)
GFR, Estimated: >60 mL/min (ref >60)
Glucose, Bld: 151 mg/dL — ABNORMAL HIGH (ref 70–99)
Potassium: 4.2 mmol/L (ref 3.5–5.1)
Sodium: 128 mmol/L — ABNORMAL LOW (ref 135–145)

## 2020-04-04 LAB — MRSA PCR SCREENING: MRSA by PCR: NEGATIVE

## 2020-04-04 LAB — GLUCOSE, CAPILLARY
Glucose-Capillary: 158 mg/dL — ABNORMAL HIGH (ref 70–99)
Glucose-Capillary: 134 mg/dL — ABNORMAL HIGH (ref 70–99)
Glucose-Capillary: 156 mg/dL — ABNORMAL HIGH (ref 70–99)
Glucose-Capillary: 210 mg/dL — ABNORMAL HIGH (ref 70–99)

## 2020-04-04 SURGERY — CORONARY ATHERECTOMY
Anesthesia: LOCAL

## 2020-04-04 MED ORDER — VIPERSLIDE LUBRICANT OPTIME: TOPICAL | Status: DC | PRN

## 2020-04-04 MED ORDER — MIDAZOLAM HCL 2 MG/2ML IJ SOLN
INTRAMUSCULAR | Status: AC
  Filled 2020-04-04: qty 2

## 2020-04-04 MED ORDER — NITROGLYCERIN 1 MG/10 ML FOR IR/CATH LAB
INTRA_ARTERIAL | Status: AC
  Filled 2020-04-04: qty 10

## 2020-04-04 MED ORDER — NITROGLYCERIN 1 MG/10 ML FOR IR/CATH LAB
INTRA_ARTERIAL | Status: DC | PRN
  Administered 2020-04-04: 200 ug via INTRACORONARY
  Administered 2020-04-04: 100 ug via INTRACORONARY
  Administered 2020-04-04 (×2): 200 ug via INTRACORONARY
  Administered 2020-04-04 (×2): 100 ug via INTRACORONARY
  Administered 2020-04-04 (×2): 200 ug via INTRACORONARY
  Administered 2020-04-04: 100 ug via INTRACORONARY
  Administered 2020-04-04: 200 ug via INTRACORONARY
  Administered 2020-04-04: 100 ug via INTRACORONARY

## 2020-04-04 MED ORDER — ENOXAPARIN SODIUM 40 MG/0.4ML ~~LOC~~ SOLN
40.0000 mg | SUBCUTANEOUS | Status: DC
  Administered 2020-04-05: 09:00:00 40 mg via SUBCUTANEOUS
  Filled 2020-04-04: qty 0.4

## 2020-04-04 MED ORDER — HYDRALAZINE HCL 20 MG/ML IJ SOLN: 10.0000 mg | INTRAMUSCULAR | Status: AC | PRN

## 2020-04-04 MED ORDER — SODIUM CHLORIDE 0.9 % IV SOLN: 250.0000 mL | INTRAVENOUS | Status: DC | PRN

## 2020-04-04 MED ORDER — FENTANYL CITRATE (PF) 100 MCG/2ML IJ SOLN
INTRAMUSCULAR | Status: AC
  Filled 2020-04-04: qty 2

## 2020-04-04 MED ORDER — HEPARIN SODIUM (PORCINE) 1000 UNIT/ML IJ SOLN
INTRAMUSCULAR | Status: DC | PRN
  Administered 2020-04-04: 2000 [IU] via INTRAVENOUS
  Administered 2020-04-04: 1500 [IU] via INTRAVENOUS
  Administered 2020-04-04: 8000 [IU] via INTRAVENOUS

## 2020-04-04 MED ORDER — FENTANYL CITRATE (PF) 100 MCG/2ML IJ SOLN
INTRAMUSCULAR | Status: DC | PRN
  Administered 2020-04-04 (×4): 25 ug via INTRAVENOUS

## 2020-04-04 MED ORDER — SODIUM CHLORIDE 0.9 % IV SOLN: INTRAVENOUS | Status: DC

## 2020-04-04 MED ORDER — LIDOCAINE HCL (PF) 1 % IJ SOLN
INTRAMUSCULAR | Status: AC
  Filled 2020-04-04: qty 30

## 2020-04-04 MED ORDER — HEPARIN (PORCINE) IN NACL 1000-0.9 UT/500ML-% IV SOLN
INTRAVENOUS | Status: DC | PRN
  Administered 2020-04-04: 500 mL

## 2020-04-04 MED ORDER — ROSUVASTATIN CALCIUM 20 MG PO TABS
40.0000 mg | ORAL_TABLET | Freq: Every day | ORAL | Status: DC
  Administered 2020-04-05: 09:00:00 40 mg via ORAL
  Filled 2020-04-04: qty 2

## 2020-04-04 MED ORDER — MIDAZOLAM HCL 2 MG/2ML IJ SOLN
INTRAMUSCULAR | Status: DC | PRN
  Administered 2020-04-04 (×5): 1 mg via INTRAVENOUS

## 2020-04-04 MED ORDER — ONDANSETRON HCL 4 MG/2ML IJ SOLN: 4.0000 mg | Freq: Four times a day (QID) | INTRAMUSCULAR | Status: DC | PRN

## 2020-04-04 MED ORDER — IOHEXOL 350 MG/ML SOLN
INTRAVENOUS | Status: AC
  Filled 2020-04-04: qty 1

## 2020-04-04 MED ORDER — TICAGRELOR 90 MG PO TABS
ORAL_TABLET | ORAL | Status: AC
  Filled 2020-04-04: qty 1

## 2020-04-04 MED ORDER — TICAGRELOR 90 MG PO TABS: 90.0000 mg | ORAL_TABLET | Freq: Two times a day (BID) | ORAL | Status: DC

## 2020-04-04 MED ORDER — ASPIRIN 81 MG PO CHEW
81.0000 mg | CHEWABLE_TABLET | Freq: Every day | ORAL | Status: DC
  Administered 2020-04-05: 09:00:00 81 mg via ORAL
  Filled 2020-04-04: qty 1

## 2020-04-04 MED ORDER — HEPARIN (PORCINE) IN NACL 1000-0.9 UT/500ML-% IV SOLN
INTRAVENOUS | Status: AC
  Filled 2020-04-04: qty 1000

## 2020-04-04 MED ORDER — HEPARIN SODIUM (PORCINE) 1000 UNIT/ML IJ SOLN
INTRAMUSCULAR | Status: AC
  Filled 2020-04-04: qty 1

## 2020-04-04 MED ORDER — SODIUM CHLORIDE 0.9% FLUSH
3.0000 mL | Freq: Two times a day (BID) | INTRAVENOUS | Status: DC
  Administered 2020-04-05: 09:00:00 3 mL via INTRAVENOUS

## 2020-04-04 MED ORDER — IOHEXOL 350 MG/ML SOLN
INTRAVENOUS | Status: DC | PRN
  Administered 2020-04-04: 240 mg

## 2020-04-04 MED ORDER — TICAGRELOR 90 MG PO TABS
ORAL_TABLET | ORAL | Status: DC | PRN
  Administered 2020-04-04: 90 mg via ORAL

## 2020-04-04 MED ORDER — SODIUM CHLORIDE 0.9% FLUSH: 3.0000 mL | INTRAVENOUS | Status: DC | PRN

## 2020-04-04 MED ORDER — LIDOCAINE HCL (PF) 1 % IJ SOLN
INTRAMUSCULAR | Status: DC | PRN
  Administered 2020-04-04: 15 mL

## 2020-04-04 MED ORDER — SODIUM CHLORIDE 0.9 % IV SOLN
INTRAVENOUS | Status: AC | PRN
  Administered 2020-04-04: 10 mL/h via INTRAVENOUS

## 2020-04-04 MED ORDER — LABETALOL HCL 5 MG/ML IV SOLN: 10.0000 mg | INTRAVENOUS | Status: AC | PRN

## 2020-04-04 MED ORDER — ACETAMINOPHEN 325 MG PO TABS: 650.0000 mg | ORAL_TABLET | ORAL | Status: DC | PRN

## 2020-04-04 SURGICAL SUPPLY — 27 items
BALLN SAPPHIRE ~~LOC~~ 2.0X10 (BALLOONS) ×1 IMPLANT
BALLN SAPPHIRE ~~LOC~~ 3.0X12 (BALLOONS) ×1 IMPLANT
BALLN SAPPHIRE ~~LOC~~ 3.25X15 (BALLOONS) ×1 IMPLANT
CATH LAUNCHER 6FR AL.75 (CATHETERS) ×1 IMPLANT
CATH OPTICROSS HD (CATHETERS) ×1 IMPLANT
CATH S G BIP PACING (CATHETERS) ×1 IMPLANT
CATH SHOCKWAVE 3.0X12 (CATHETERS) IMPLANT
CATH TELEPORT (CATHETERS) ×1 IMPLANT
CATH VISTA GUIDE 6FR JR4 (CATHETERS) ×1 IMPLANT
CATHETER SHOCKWAVE 3.0X12 (CATHETERS) ×2
COVER SWIFTLINK CONNECTOR (BAG) ×1 IMPLANT
CROWN DIAMONDBACK CLASSIC 1.25 (BURR) ×1 IMPLANT
ELECT DEFIB PAD ADLT CADENCE (PAD) ×1 IMPLANT
KIT ENCORE 26 ADVANTAGE (KITS) ×1 IMPLANT
KIT HEART LEFT (KITS) ×2 IMPLANT
LUBRICANT VIPERSLIDE CORONARY (MISCELLANEOUS) ×1 IMPLANT
PACK CARDIAC CATHETERIZATION (CUSTOM PROCEDURE TRAY) ×2 IMPLANT
SHEATH PINNACLE 6F 10CM (SHEATH) ×2 IMPLANT
SHEATH PROBE COVER 6X72 (BAG) ×1 IMPLANT
SLED PULL BACK IVUS (MISCELLANEOUS) ×1 IMPLANT
SLEEVE REPOSITIONING LENGTH 30 (MISCELLANEOUS) ×1 IMPLANT
STENT RESOLUTE ONYX 3.0X34 (Permanent Stent) ×1 IMPLANT
TRANSDUCER W/STOPCOCK (MISCELLANEOUS) ×2 IMPLANT
TUBING CIL FLEX 10 FLL-RA (TUBING) ×2 IMPLANT
WIRE ASAHI PROWATER 300CM (WIRE) ×2 IMPLANT
WIRE EMERALD 3MM-J .035X150CM (WIRE) ×1 IMPLANT
WIRE VIPERWIRE COR FLEX .012 (WIRE) ×1 IMPLANT

## 2020-04-04 NOTE — TOC Progression Note (Signed)
Transition of Care [facility] - Progression Note    Patient Details  Name: Luke Harvey MRN: [id number] Date of Birth: 07-Sep-1945  Transition of Care [facility]) CM/SW Contact  Leone Haven, RN Phone Number: 04/04/2020, 2:51 PM  Clinical Narrative:     NCM spoke with patient and daughter who was at bedside, he lives alone , still drives, has a girlfriend who comes by often.  He has a walker at home.  He drives himself to his MD apts or his girlfriend takes him and he cooks his own food.  He goes to [place]. Will need to fax dc summary to PCP at the [facility], regarding meds and brilinta. PCP is Dr. Floreen Comber fax is [phone].        Expected Discharge Plan and Services                                                 Social Determinants of Health (SDOH) Interventions    Readmission Risk Interventions No flowsheet data found.

## 2020-04-04 NOTE — TOC Benefit Eligibility Note (Signed)
Transition of Care [facility]) Benefit Eligibility Note    Patient Details  Name: Luke Harvey MRN: [id number] Date of Birth: 11-Dec-1945   Medication/Dose: [name]  90 MG BID                       Additional Notes: [facility]    Mardene Sayer Phone Number: 04/04/2020, 3:09 PM

## 2020-04-04 NOTE — Progress Notes (Signed)
Order for sheath removal verified per post procedural orders. Procedure explained to patient and right femoral artery and vein access site assessed: level 0, palpable dorsalis pedis and posterior tibial pulses. 6 [place] Sheaths removed and manual pressure applied for 20 minutes. Pre, peri, & post procedural vitals: HR 80, RR 12, O2 Sat 98%, BP 157/88, Pain level 0. Distal pulses remained intact after sheath removal. Access site level 0 and dressed with 4X4 gauze and tegaderm.Carla RN confirmed condition of site. Post procedural instructions discussed with return demonstration from patient.

## 2020-04-04 NOTE — Interval H&P Note (Signed)
History and Physical Interval Note:  04/04/2020 7:58 AM  Luke Harvey  has presented today for surgery, with the diagnosis of CAD.  The various methods of treatment have been discussed with the patient and family. After consideration of risks, benefits and other options for treatment, the patient has consented to  Procedure(s): CORONARY ATHERECTOMY (N/A) as a surgical intervention.  The patient's history has been reviewed, patient examined, no change in status, stable for surgery.  I have reviewed the patient's chart and labs.  Questions were answered to the patient's satisfaction.  I also personally called the patient's daughter Luke Harvey and discussed the planned procedure in detail including risks/benefits. Cath Lab Visit (complete for each Cath Lab visit)  Clinical Evaluation Leading to the Procedure:   ACS: No.  Non-ACS:    Anginal Classification: CCS III  Anti-ischemic medical therapy: Minimal Therapy (1 class of medications)  Non-Invasive Test Results: No non-invasive testing performed  Prior CABG: Previous CABG         Luke Harvey

## 2020-04-04 NOTE — Progress Notes (Addendum)
Sheath removed @1655hrs  , by cath lab staff , pressure applied , no hematoma or bleeding noted after, bandage is in place , Pt informed of bed rest for 4 hrs post sheath removal.Daughter is with him .

## 2020-04-04 NOTE — Progress Notes (Signed)
ACT checked at about 1420 same was 194 to be rechecked in order to have sheath removed.cath lab called and reminded.

## 2020-04-04 NOTE — Plan of Care (Signed)
  Problem: Education: Goal: Knowledge of General Education information will improve Description: Including pain rating scale, medication(s)/side effects and non-pharmacologic comfort measures Outcome: Progressing   Problem: Clinical Measurements: Goal: Ability to maintain clinical measurements within normal limits will improve Outcome: Progressing   Problem: Nutrition: Goal: Adequate nutrition will be maintained Outcome: Progressing   Problem: Elimination: Goal: Will not experience complications related to urinary retention Outcome: Progressing   Problem: Pain Managment: Goal: General experience of comfort will improve Outcome: Progressing   Problem: Skin Integrity: Goal: Risk for impaired skin integrity will decrease Outcome: Progressing   Problem: Education: Goal: Understanding of CV disease, CV risk reduction, and recovery process will improve Outcome: Progressing   Problem: Cardiovascular: Goal: Vascular access site(s) Level 0-1 will be maintained Outcome: Progressing   Problem: Education: Goal: Ability to demonstrate management of disease process will improve Outcome: Progressing   Problem: Education: Goal: Ability to verbalize understanding of medication therapies will improve Outcome: Progressing

## 2020-04-04 NOTE — Progress Notes (Signed)
Pt came from Cath lab , alert and oriented, placed in bed ,sheath in place to be removed ,

## 2020-04-05 ENCOUNTER — Telehealth: Payer: Self-pay | Admitting: Physician Assistant

## 2020-04-05 ENCOUNTER — Encounter (HOSPITAL_COMMUNITY): Payer: Self-pay | Admitting: Cardiovascular Disease

## 2020-04-05 DIAGNOSIS — I350 Nonrheumatic aortic (valve) stenosis: Secondary | ICD-10-CM

## 2020-04-05 DIAGNOSIS — E871 Hypo-osmolality and hyponatremia: Secondary | ICD-10-CM

## 2020-04-05 DIAGNOSIS — Z955 Presence of coronary angioplasty implant and graft: Secondary | ICD-10-CM

## 2020-04-05 LAB — CBC
HCT: 36.7 % — ABNORMAL LOW (ref 39.0–52.0)
Hemoglobin: 12.9 g/dL — ABNORMAL LOW (ref 13.0–17.0)
MCH: 29.6 pg (ref 26.0–34.0)
MCHC: 35.1 g/dL (ref 30.0–36.0)
MCV: 84.2 fL (ref 80.0–100.0)
Platelets: 195 10*3/uL (ref 150–400)
RBC: 4.36 MIL/uL (ref 4.22–5.81)
RDW: 13 % (ref 11.5–15.5)
WBC: 6.7 10*3/uL (ref 4.0–10.5)
nRBC: 0 % (ref 0.0–0.2)

## 2020-04-05 LAB — HEPATIC FUNCTION PANEL
ALT: 19 U/L (ref 0–44)
AST: 31 U/L (ref 15–41)
Albumin: 3.2 g/dL — ABNORMAL LOW (ref 3.5–5.0)
Alkaline Phosphatase: 71 U/L (ref 38–126)
Bilirubin, Direct: 0.1 mg/dL (ref 0.0–0.2)
Indirect Bilirubin: 0.5 mg/dL (ref 0.3–0.9)
Total Bilirubin: 0.6 mg/dL (ref 0.3–1.2)
Total Protein: 5.7 g/dL — ABNORMAL LOW (ref 6.5–8.1)

## 2020-04-05 LAB — BASIC METABOLIC PANEL
Anion gap: 8 (ref 5–15)
BUN: 12 mg/dL (ref 8–23)
CO2: 22 mmol/L (ref 22–32)
Calcium: 9.1 mg/dL (ref 8.9–10.3)
Chloride: 103 mmol/L (ref 98–111)
Creatinine, Ser: 0.98 mg/dL (ref 0.61–1.24)
GFR, Estimated: >60 mL/min (ref >60)
Glucose, Bld: 172 mg/dL — ABNORMAL HIGH (ref 70–99)
Potassium: 4.5 mmol/L (ref 3.5–5.1)
Sodium: 133 mmol/L — ABNORMAL LOW (ref 135–145)

## 2020-04-05 LAB — GLUCOSE, CAPILLARY
Glucose-Capillary: 143 mg/dL — ABNORMAL HIGH (ref 70–99)
Glucose-Capillary: 233 mg/dL — ABNORMAL HIGH (ref 70–99)

## 2020-04-05 MED ORDER — ROSUVASTATIN CALCIUM 40 MG PO TABS: 40.0000 mg | ORAL_TABLET | Freq: Every day | ORAL | 11 refills | Status: AC

## 2020-04-05 MED ORDER — TICAGRELOR 90 MG PO TABS: 90.0000 mg | ORAL_TABLET | Freq: Two times a day (BID) | ORAL | 11 refills | Status: DC

## 2020-04-05 MED ORDER — ROSUVASTATIN CALCIUM 40 MG PO TABS: 40.0000 mg | ORAL_TABLET | Freq: Every day | ORAL | 0 refills | Status: DC

## 2020-04-05 MED ORDER — TICAGRELOR 90 MG PO TABS: 90.0000 mg | ORAL_TABLET | Freq: Two times a day (BID) | ORAL | 0 refills | Status: DC

## 2020-04-05 NOTE — Plan of Care (Signed)
  Problem: Education: Goal: Knowledge of General Education information will improve Description: Including pain rating scale, medication(s)/side effects and non-pharmacologic comfort measures Outcome: Progressing   Problem: Health Behavior/Discharge Planning: Goal: Ability to manage health-related needs will improve Outcome: Progressing   Problem: Clinical Measurements: Goal: Ability to maintain clinical measurements within normal limits will improve Outcome: Progressing Goal: Will remain free from infection Outcome: Progressing Goal: Diagnostic test results will improve Outcome: Progressing Goal: Respiratory complications will improve Outcome: Progressing Goal: Cardiovascular complication will be avoided Outcome: Progressing   Problem: Activity: Goal: Risk for activity intolerance will decrease Outcome: Progressing   Problem: Nutrition: Goal: Adequate nutrition will be maintained Outcome: Progressing   Problem: Coping: Goal: Level of anxiety will decrease Outcome: Progressing   Problem: Elimination: Goal: Will not experience complications related to bowel motility Outcome: Progressing Goal: Will not experience complications related to urinary retention Outcome: Progressing   Problem: Pain Managment: Goal: General experience of comfort will improve Outcome: Progressing   Problem: Safety: Goal: Ability to remain free from injury will improve Outcome: Progressing   Problem: Skin Integrity: Goal: Risk for impaired skin integrity will decrease Outcome: Progressing   Problem: Education: Goal: Understanding of CV disease, CV risk reduction, and recovery process will improve Outcome: Progressing Goal: Individualized Educational Video(s) Outcome: Progressing   Problem: Activity: Goal: Ability to return to baseline activity level will improve Outcome: Progressing   Problem: Cardiovascular: Goal: Ability to achieve and maintain adequate cardiovascular perfusion  will improve Outcome: Progressing Goal: Vascular access site(s) Level 0-1 will be maintained Outcome: Progressing   Problem: Health Behavior/Discharge Planning: Goal: Ability to safely manage health-related needs after discharge will improve Outcome: Progressing   Problem: Education: Goal: Ability to demonstrate management of disease process will improve Outcome: Progressing Goal: Ability to verbalize understanding of medication therapies will improve Outcome: Progressing   Problem: Activity: Goal: Capacity to carry out activities will improve Outcome: Progressing   Problem: Cardiac: Goal: Ability to achieve and maintain adequate cardiopulmonary perfusion will improve Outcome: Progressing

## 2020-04-05 NOTE — Progress Notes (Signed)
Pt discharged home... will be going home with wife.Marland Kitchen He was given his AVS and reviewed his AVS in detailed along with specific instructions on medications and when to taken and restart his meds. Pt was given his Brilinta... he will pickup his prescription from the [place]

## 2020-04-05 NOTE — Discharge Summary (Signed)
Discharge Summary    Patient ID: LAYMAN GULLY MRN: [id number]; DOB: 10-28-1945  Admit date: 03/31/2020 Discharge date: 04/05/2020  Primary Care Provider: [facility]  Primary Cardiologist: Lance Muss, MD  Primary Electrophysiologist:  None   Discharge Diagnoses    Principal Problem:   Unstable angina [facility]) Active Problems:   Diabetes mellitus (HCC)   Coronary artery disease with hx CABG   HLD (hyperlipidemia)   Status post coronary artery stent placement   Aortic stenosis   Hyponatremia    Diagnostic Studies/Procedures    1. Initial cath 04/02/20   There is mild left ventricular systolic dysfunction.  There is no aortic valve stenosis.  There is no mitral valve regurgitation.  Mid LM to Prox LAD lesion is 95% stenosed with 90% stenosed side branch in Ost Cx to Prox Cx.  Prox LAD to Mid LAD lesion is 100% stenosed with 99% stenosed side branch in 2nd Diag.  2nd Mrg lesion is 90% stenosed.  Prox RCA lesion is 80% stenosed.  Mid RCA to Dist RCA lesion is 90% stenosed.  LIMA graft was visualized by angiography and is very large. The graft exhibits no disease.  -----  SVG graft was visualized by angiography and is large.  Lesion #2: Origin lesion is 90% stenosed.  A drug-eluting stent was successfully placed using a STENT RESOLUTE ONYX 3.0X15.  Post intervention, there is a 0% residual stenosis. However the next lesion appeared to be more significant  Lesion #3: Prox Graft lesion is 70% stenosed.  A drug-eluting stent was successfully placed overlapping the ostial stent, using a STENT RESOLUTE ONYX 3.0X12. Postdilated to 3.3 mm  Post intervention, there is a 0% residual stenosis  Lesion #1: Mid Graft to Dist Graft lesion is 99% stenosed.  A drug-eluting stent was successfully placed using a STENT RESOLUTE ONYX 3.0X22. Postdilated to 3.3 mm  Post intervention, there is a 0% residual stenosis.   SUMMARY  SEVERE NATIVE VESSEL  CAD: ? Persistent severe LEFT MAIN trifurcation 99% stenosis with occluded LAD, and small caliber ramus intermedius as well as OM1.    LCx gives off a major (grafted) OM 2 branch that now has actually antegrade flow beyond a 90% stenosis. ? Severe heavily calcified mid RCA 80% eccentric lesion proximal to stent over stent in the mid to distal vessel that has a focal area of 95 to 99% stenosis (appears to potentially be stent strut fracture -> that was noted following last PCI in 2018)  Widely patent LIMA-LAD with diffuse small LAD downstream.  SVG-OM has near ostial 90% stenosis followed by focal area of 70% (NOTED worse following stenting of the ostial segment), and then mid graft 99% lesion (culprit lesion)  Borderline elevated LVEDP of roughly 16 mmHg - (no LV Gram)   RECOMMENDATIONS  For now we will plan to monitor the patient post-cath and have discussed gust staged evaluation and PCI of the RCA.  This will likely require atherectomy of the more proximal-mid lesion, followed by IVUS of the in-stent restenosis segment in order to understand the pathology to properly treat (especially since this would be placing a third stent)  Patient was reloaded on Brilinta.  Continue risk factor modification.  Bryan Lemma, MD   2. F/u cath 04/04/20   Mid RCA lesion is 95% stenosed.  A stent was successfully placed.  Prox RCA lesion is 50% stenosed.  Post intervention, there is a 0% residual stenosis.  Prox RCA to Mid RCA lesion is 85% stenosed.  Post intervention, there is a 0% residual stenosis.  Post intervention, there is a 0% residual stenosis.   Difficult but ultimately successful complex coronary intervention to a significantly calcified proximal to mid RCA with stenoses of 50 to 80% with vessel angulation and 95% stenosis in the previously placed sandwich stents in the distal RCA.    The proximal to mid stenoses was successfully treated with extensive CSI orbital  atherectomy shockwave intravascular lithotripsy (3.0 x12 mm) and ultimate and stenting with a 3.0 x 34 mm Resolute DES stent postdilated to 3.25 mm with the entire region of stenoses being reduced to 0%.  The distal RCA in-stent restenosis from the 2015 in 2018 stents was successfully treated with noncompliant balloon dilatation, and shockwave intravascular lithotripsy (3.0 x 12) with the 95% stenosis being reduced to 0%.  Adjunctive use of intravascular ultrasound.  Temporary pacemaker inserted prior to atherectomy of a large RCA vessel with removal of pacemaker at the completion of the procedure.  RECOMMENDATION: Long term DAPT.  Optimal blood pressure control.  Aggressive lipid-lowering therapy with target LDL less than 70.    _____________   History of Present Illness     Luke Harvey is a 74 y.o. male with CABG 2V 2014 with subsequent PCI in 2015 and 2018, HTN, HLD, prior cardiomyopathy (EF 40-45% in 2018), PAD with LE claudication, hypothyroidism, previous tobacco use, anxiety, macular degeneration, arthritis, chronic sinusitis, diabetic neuropathy, hepatitis B, prior DVT, GERD, diverticulitis, bleeding ulcer, hiatal hernia, recovering alcoholism, sleep apnea, and DM who presented to [facility] with chest pain.  PMH noted above. He underwent CABG in 2014 with LIMA-LAD and SVG-LCx.  In 2015 he underwent stenting of his unbypassed RCA. These procedures were done in [place], [place]. His last cath was in 2018 at [facility] for NSTEMI showing severe native disease noted, but patent LIMA-LAD and SVG-LCx with ISR in the RCA treated with PCI/DES. EF noted at 40-45% with anterolateral WMA during that time. He follows with the [facility]. The last few weeks he had been noticing intermittent chest heaviness with exertion. The day of admission symptoms worsened while at [employer] so he went to the [place] clinic and had an EKG done. He was told to come directly to [facility] for evaluation. Here EKG  showed NSR without acute ischemia. HsTroponins were negative. He was admitted for further management of unstable angina.  Hospital Course     1. Unstable angina/CAD - troponins were negative. Underwent initial cath as outlined above on 04/02/20 with severe disease in the ostium and mid body of the vein graft to the OM treated with drug eluting stents. There was residual high grade, heavily calcified disease in the mid RCA and restenosis vs stent fracture in the distal RCA stent. He went back to the cath lab 04/04/20 and underwent difficult but ultimately successful complex coronary intervention to a significantly calcified proximal to mid RCA with stenoses of 50 to 80% with vessel angulation and 95% stenosis in the previously placed sandwich stents in the distal RCA. The proximal to mid stenoses was successfully treated with extensive CSI orbital atherectomy shockwave intravascular lithotripsy (3.0 x12 mm) and ultimate and stenting with a 3.0 x 34 mm Resolute DES stent postdilated to 3.25 mm with the entire region of stenoses being reduced to 0%. The distal RCA in-stent restenosis from the 2015 in 2018 stents was successfully treated with noncompliant balloon dilatation, and shockwave intravascular lithotripsy (3.0 x 12) with the 95% stenosis being reduced to 0%. Second  cath report recommends long term DAPT.  2D Echo this admission showed EF 60-65%, mild-moderate AS (mild by gradients but SV and [name] were c/w moderate AS). His statin was titrated. He'll continue his home BB.  Brilinta was restarted this admission (was on this several years ago) - our original plan was to send in [place] and [facility] with refills to [place]. This was a bit of a challenge since [facility] and this was otherwise cost prohibitive [facility] therefore [facility] told [facility] to [facility] the [facility] I'd sent in. He did not qualify for the 30 day free card since he'd previously used Brilinta in  2018. Care management and main pharmacy have assisted in getting him 1 week supply of Brilinta given to him at bedside prior to discharge. I sent refills to the [facility]. The importance of picking up his rx on Monday and not missing any doses was reinforced. Also sent message to our office to help check up on this - see phone note.   2. HTN - slightly elevated yesterday, but improved this morning. Continue BB.  3. Hyperlipidemia - LDL was 74. Crestor was titrated from 20mg  to 40mg  daily. In talking to the patient today he thinks he also had simvastatin at home but he is not sure. He was advised to avoid simvastatin going forward and use rosuvastatin only. He did not wish for me to send in a short-term rx for rosuvastatin locally just in case but instead plans to pick it up at the [place] on Monday regardless. If the patient is tolerating statin at time of follow-up appointment, would consider rechecking liver function/lipid panel in 6-8 weeks. Baseline LFTs sent day of discharge.  4. Hyponatremia - incidentally noted on labs, will need to f/u PCP for this. Advised to do so on AVS.  5. Mild-moderate AS - echo suspects moderate AS. Will need clinical f/u of this as OP.  6. PAD - follows at the [place] with this. Per d/w Dr. Delton See he can continue his cilostazol/Pletal for claudication at current dose but she recommends he contact his vascular team who prescribes this to review dosing going forward given that he is also on ASA and Brilinta. She also recommends he have a CBC at his follow-up appointment, whichever office he should land. I outlined this on his AVS.  Bleeding precautions reviewed.  7. DM -  He was advised to hold metformin for at least 48 hours post-cath, to restart on 04/07/20  Dr. Delton See has seen and examined the patient today and feels he is stable for discharge. With regard to follow-up, we recommended the patient call his cardiologist at the [facility] on Monday to be seen within 1-2 weeks. In the  phone note referenced above, I also sent a request to our triage team to call him to make sure he was able to secure that appointment - if the [facility] is unable to accomodate that timeframe, I asked our office to please schedule in our clinic.  Did the patient have an acute coronary syndrome (MI, NSTEMI, STEMI, etc) this admission?:  No                               Did the patient have a percutaneous coronary intervention (stent / angioplasty)?:  Yes.     Cath/PCI Registry Performance & Quality Measures: 1. Aspirin prescribed? - Yes 2. ADP Receptor Inhibitor (  Plavix/Clopidogrel, Brilinta/Ticagrelor or Effient/Prasugrel) prescribed (includes medically managed patients)? - Yes 3. High Intensity Statin (Lipitor 40-80mg  or Crestor 20-40mg ) prescribed? - Yes 4. For EF <40%, was ACEI/ARB prescribed? - Not Applicable (EF >/= 40%) 5. For EF <40%, Aldosterone Antagonist (Spironolactone or Eplerenone) prescribed? - Not Applicable (EF >/= 40%) 6. Cardiac Rehab Phase II ordered? - Yes    _____________  Discharge Vitals Blood pressure 109/67, pulse 74, temperature 97.6 F (36.4 C), temperature source Oral, resp. rate 18, height 5\' 6"  (1.676 m), weight 80.5 kg, SpO2 99 %.  Filed Weights   04/01/20 1210 04/02/20 0146 04/04/20 0357  Weight: 83.9 kg 83.4 kg 80.5 kg    Labs & Radiologic Studies    CBC Recent Labs    04/04/20 0010 04/05/20 0028  WBC 5.8 6.7  HGB 13.1 12.9*  HCT 38.6* 36.7*  MCV 84.1 84.2  PLT 200 195   Basic Metabolic Panel Recent Labs    47/82/95 0010 04/04/20 1441 04/05/20 0028  NA 128*  --  133*  K 4.2  --  4.5  CL 99  --  103  CO2 21*  --  22  GLUCOSE 151*  --  172*  BUN 14  --  12  CREATININE 0.97 1.04 0.98  CALCIUM 8.7*  --  9.1   High Sensitivity Troponin:   Recent Labs  Lab 03/31/20 1647 03/31/20 2044 04/01/20 1754 04/02/20 0022  TROPONINIHS 14 13 11  22*    _____________  DG Chest 2 View  Result Date: 03/31/2020 CLINICAL DATA:  Chest pain x1 day.  EXAM: CHEST - 2 VIEW COMPARISON:  June 08, 2016 FINDINGS: Multiple sternal wires and vascular clips are seen. Stable, diffuse chronic appearing increased interstitial lung markings are seen. Stable mild to moderate severity areas of scarring and/or atelectasis are seen within the bilateral lung bases. There is no evidence of a pleural effusion or pneumothorax. The heart size and mediastinal contours are within normal limits. A radiopaque fusion plate and screws are seen overlying the cervical spine. IMPRESSION: 1. Evidence of prior median sternotomy/CABG. 2. Stable bibasilar scarring and/or atelectasis. Electronically Signed   By: Aram Candela M.D.   On: 03/31/2020 17:28   CARDIAC CATHETERIZATION  Result Date: 04/04/2020  Mid RCA lesion is 95% stenosed.  A stent was successfully placed.  Prox RCA lesion is 50% stenosed.  Post intervention, there is a 0% residual stenosis.  Prox RCA to Mid RCA lesion is 85% stenosed.  Post intervention, there is a 0% residual stenosis.  Post intervention, there is a 0% residual stenosis.  Difficult but ultimately successful complex coronary intervention to a significantly calcified proximal to mid RCA with stenoses of 50 to 80% with vessel angulation and 95% stenosis in the previously placed sandwich stents in the distal RCA.  The proximal to mid stenoses was successfully treated with extensive CSI orbital atherectomy shockwave intravascular lithotripsy (3.0 x12 mm) and ultimate and stenting with a 3.0 x 34 mm Resolute DES stent postdilated to 3.25 mm with the entire region of stenoses being reduced to 0%. The distal RCA in-stent restenosis from the 2015 in 2018 stents was successfully treated with noncompliant balloon dilatation, and shockwave intravascular lithotripsy (3.0 x 12) with the 95% stenosis being reduced to 0%. Adjunctive use of intravascular ultrasound. Temporary pacemaker inserted prior to atherectomy of a large RCA vessel with removal of pacemaker  at the completion of the procedure. RECOMMENDATION: Long term DAPT.  Optimal blood pressure control.  Aggressive lipid-lowering therapy with target LDL  less than 70.   CARDIAC CATHETERIZATION  Result Date: 04/02/2020  There is mild left ventricular systolic dysfunction.  There is no aortic valve stenosis.  There is no mitral valve regurgitation.  Mid LM to Prox LAD lesion is 95% stenosed with 90% stenosed side branch in Ost Cx to Prox Cx.  Prox LAD to Mid LAD lesion is 100% stenosed with 99% stenosed side branch in 2nd Diag.  2nd Mrg lesion is 90% stenosed.  Prox RCA lesion is 80% stenosed.  Mid RCA to Dist RCA lesion is 90% stenosed.  LIMA graft was visualized by angiography and is very large. The graft exhibits no disease.  -----  SVG graft was visualized by angiography and is large.  Lesion #2: Origin lesion is 90% stenosed.  A drug-eluting stent was successfully placed using a STENT RESOLUTE ONYX 3.0X15.  Post intervention, there is a 0% residual stenosis. However the next lesion appeared to be more significant  Lesion #3: Prox Graft lesion is 70% stenosed.  A drug-eluting stent was successfully placed overlapping the ostial stent, using a STENT RESOLUTE ONYX 3.0X12. Postdilated to 3.3 mm  Post intervention, there is a 0% residual stenosis  Lesion #1: Mid Graft to Dist Graft lesion is 99% stenosed.  A drug-eluting stent was successfully placed using a STENT RESOLUTE ONYX 3.0X22. Postdilated to 3.3 mm  Post intervention, there is a 0% residual stenosis.  SUMMARY  SEVERE NATIVE VESSEL CAD:  Persistent severe LEFT MAIN trifurcation 99% stenosis with occluded LAD, and small caliber ramus intermedius as well as OM1.   LCx gives off a major (grafted) OM 2 branch that now has actually antegrade flow beyond a 90% stenosis.  Severe heavily calcified mid RCA 80% eccentric lesion proximal to stent over stent in the mid to distal vessel that has a focal area of 95 to 99% stenosis (appears to  potentially be stent strut fracture -> that was noted following last PCI in 2018)  Widely patent LIMA-LAD with diffuse small LAD downstream.  SVG-OM has near ostial 90% stenosis followed by focal area of 70% (NOTED worse following stenting of the ostial segment), and then mid graft 99% lesion (culprit lesion)  Borderline elevated LVEDP of roughly 16 mmHg - (no LV Gram) RECOMMENDATIONS  For now we will plan to monitor the patient post-cath and have discussed gust staged evaluation and PCI of the RCA.  This will likely require atherectomy of the more proximal-mid lesion, followed by IVUS of the in-stent restenosis segment in order to understand the pathology to properly treat (especially since this would be placing a third stent)  Patient was reloaded on Brilinta.  Continue risk factor modification. Bryan Lemma, MD  ECHOCARDIOGRAM COMPLETE  Result Date: 04/02/2020    ECHOCARDIOGRAM REPORT   Patient Name:   DAIMIAN SUDBERRY [facility] Date of Exam: 04/02/2020 Medical Rec #:  [id number]        Height:       66.0 in Accession #:    [id number]       Weight:       183.9 lb Date of Birth:  11/21/1945        BSA:          1.930 m Patient Age:    74 years         BP:           124/68 mmHg Patient Gender: M                HR:  62 bpm. Exam Location:  Inpatient Procedure: 2D Echo Indications:    chest pain 786.50  History:        Patient has no prior history of Echocardiogram examinations.                 Prior CABG, Signs/Symptoms:Chest Pain; Risk Factors:Dyslipidemia                 and Diabetes.  Sonographer:    Delcie Roch Referring Phys: [id number] LINDSAY B ROBERTS IMPRESSIONS  1. Left ventricular ejection fraction, by estimation, is 60 to 65%. The left ventricle has normal function. The left ventricle has no regional wall motion abnormalities. Left ventricular diastolic parameters are indeterminate.  2. Right ventricular systolic function is normal. The right ventricular size is normal. Tricuspid regurgitation  signal is inadequate for assessing PA pressure.  3. The mitral valve is normal in structure. Trivial mitral valve regurgitation.  4. The aortic valve is calcified. There is moderate calcification of the aortic valve. Aortic valve regurgitation is trivial. Mild to moderate aortic valve stenosis. Mild AS by gradients (Vmax 2.5 m/s, MG ), but low SV (SVi 31 cc/m^2) and [name] (0.9  cm^2) and DI (0.31) consistent with moderate AS. Suspect moderate AS.  5. The inferior vena cava is normal in size with greater than 50% respiratory variability, suggesting right atrial pressure of 3 mmHg. FINDINGS  Left Ventricle: Left ventricular ejection fraction, by estimation, is 60 to 65%. The left ventricle has normal function. The left ventricle has no regional wall motion abnormalities. The left ventricular internal cavity size was normal in size. There is  no left ventricular hypertrophy. Left ventricular diastolic parameters are indeterminate. Right Ventricle: The right ventricular size is normal. Right vetricular wall thickness was not assessed. Right ventricular systolic function is normal. Tricuspid regurgitation signal is inadequate for assessing PA pressure. Left Atrium: Left atrial size was normal in size. Right Atrium: Right atrial size was normal in size. Pericardium: There is no evidence of pericardial effusion. Mitral Valve: The mitral valve is normal in structure. Trivial mitral valve regurgitation. Tricuspid Valve: The tricuspid valve is normal in structure. Tricuspid valve regurgitation is trivial. Aortic Valve: The aortic valve is calcified. There is moderate calcification of the aortic valve. Aortic valve regurgitation is trivial. Mild to moderate aortic stenosis is present. Aortic valve mean gradient measures 14.0 mmHg. Aortic valve peak gradient measures 25.2 mmHg. Aortic valve area, by VTI measures 0.89 cm. Pulmonic Valve: The pulmonic valve was not well visualized. Pulmonic valve regurgitation is not  visualized. Aorta: The aortic root and ascending aorta are structurally normal, with no evidence of dilitation. Venous: The inferior vena cava is normal in size with greater than 50% respiratory variability, suggesting right atrial pressure of 3 mmHg. IAS/Shunts: The interatrial septum was not well visualized.  LEFT VENTRICLE PLAX 2D LVIDd:         4.30 cm  Diastology LVIDs:         2.80 cm  LV e' medial:    5.44 cm/s LV PW:         1.00 cm  LV E/e' medial:  20.6 LV IVS:        1.00 cm  LV e' lateral:   6.42 cm/s LVOT diam:     1.90 cm  LV E/e' lateral: 17.4 LV SV:         58 LV SV Index:   30 LVOT Area:     2.84 cm  RIGHT VENTRICLE  IVC RV S prime:     10.70 cm/s  IVC diam: 1.40 cm TAPSE (M-mode): 1.8 cm LEFT ATRIUM             Index       RIGHT ATRIUM           Index LA diam:        3.60 cm 1.87 cm/m  RA Area:     13.00 cm LA Vol (A2C):   57.5 ml 29.80 ml/m RA Volume:   31.30 ml  16.22 ml/m LA Vol (A4C):   48.1 ml 24.93 ml/m LA Biplane Vol: 52.5 ml 27.21 ml/m  AORTIC VALVE AV Area (Vmax):    1.00 cm AV Area (Vmean):   0.89 cm AV Area (VTI):     0.89 cm AV Vmax:           251.00 cm/s AV Vmean:          179.000 cm/s AV VTI:            0.649 m AV Peak Grad:      25.2 mmHg AV Mean Grad:      14.0 mmHg LVOT Vmax:         88.60 cm/s LVOT Vmean:        56.300 cm/s LVOT VTI:          0.203 m LVOT/AV VTI ratio: 0.31  AORTA Ao Root diam: 3.10 cm Ao Asc diam:  3.40 cm MITRAL VALVE MV Area (PHT): 3.53 cm     SHUNTS MV Decel Time: 215 msec     Systemic VTI:  0.20 m MV E velocity: 112.00 cm/s  Systemic Diam: 1.90 cm MV A velocity: 122.00 cm/s MV E/A ratio:  0.92 Epifanio Lesches MD Electronically signed by Epifanio Lesches MD Signature Date/Time: 04/02/2020/11:02:49 AM    Final    Disposition   Pt is being discharged home today in good condition.  Follow-up Plans & Appointments     Follow-up Information    [facility]   Why: See discharge instructions for full  details Contact information: [address] [facility] [place] [address] [phone]        Corky Crafts, MD Follow up.   Specialties: Cardiology, Radiology, Interventional Cardiology Why: Please call your [facility] cardiologist to be seen within 1-2 weeks at the [facility] with a CBC (blood count). If they are unable to fit you in, we can see you in our office. Our office [facility]) will call you to check on you on Monday.  Contact information: [address] [place] [address] [phone]              Discharge Instructions    Amb Referral to Cardiac Rehabilitation   Complete by: As directed    Diagnosis: Coronary Stents   After initial evaluation and assessments completed: Virtual Based Care may be provided alone or in conjunction with Phase 2 Cardiac Rehab based on patient barriers.: Yes   Diet - low sodium heart healthy   Complete by: As directed    Discharge instructions   Complete by: As directed    PLEASE SHOW THIS PAPER TO THE PROVIDER YOU SEE IN FOLLOW-UP.  You should not take any metformin for at least 48 hours after your last heart cath. You can restart this the morning of 04/07/20.  You were started back on Brilinta/ticagrelor. You were provided enough tablets going home from the hospital for a 7 day supply. [facility] Monday 04/07/20 to pick up the rest of your prescription - we sent the refills in there. If you have any difficulty filling this medicine, call our office immediately. This medicine is in addition to your aspirin. If you run out of this medicine you could have a dangerous heart attack or your stents could close.  Your rosuvastatin dose was increased from 20mg  to 40mg  daily. Our records show that you were taking a 1/2 tablet of a 40mg  tablet at home. Please confirm the dose when you get home. If it is a 40mg  tablet, you can simply increase your home dose to 1 whole tablet daily. Do not  take any further simvastatin. We sent in an updated prescription with refills to [facility] to pick up on Monday as well.  Dr. Delton SeeNelson felt you could continue your cilostazol for now, which is a medicine you take for your claudication/peripheral vascular disease. IMPORTANT: This medicine can increase bleeding risk while on other blood thinners so PLEASE CALL YOUR VASCULAR DOCTOR THAT PRESCRIBES THIS MEDICINE ON MONDAY to discuss whether they want you to continue the cilostazol at its current dose. If you notice any bleeding such as blood in stool, black tarry stools, blood in urine, nosebleeds or any other unusual bleeding, call your doctor immediately. It is not normal to have this kind of bleeding while on a blood thinner and usually indicates there is an underlying problem with one of your body systems that needs to be checked out.   IMPORTANT: Because you'll be going home on blood thinners, Dr. Delton SeeNelson recommends having your blood count (CBC) checked at your post-hospital follow-up appointment. Please show this paper to the provider you see. You'll also need follow-up liver function and cholesterol testing in 6-8 weeks since your cholesterol medicine was increased.  Please follow up with your primary care provider since your sodium level was slightly low (133, normal 135). This does not correlate to dietary sodium so it does not mean you should increase salt/sodium in your diet - this can be related to other causes.   Increase activity slowly   Complete by: As directed    No driving for 2 days. No lifting over 5 lbs for 1 week. No sexual activity for 1 week. You may not return to work until cleared at your follow-up visit (if applicable). Keep procedure site clean & dry. If you notice increased pain, swelling, bleeding or pus, call/return!  You may shower, but no soaking baths/hot tubs/pools for 1 week.      Discharge Medications   Allergies as of 04/05/2020      Reactions    Tetracyclines & Related Hives   Lisinopril Cough   Lipitor [atorvastatin] Other (See Comments)   Sore muscles      Medication List    TAKE these medications   acetaminophen 325 MG tablet Commonly known as: TYLENOL Take 2 tablets (650 mg total) by mouth every 4 (four) hours as needed for headache or mild pain.   ALPRAZolam 0.5 MG tablet Commonly known as: XANAX Take 0.25 mg by mouth 2 (two) times daily as needed for sleep.   aspirin EC 81 MG tablet Take 81 mg by mouth daily.   cilostazol 100 MG tablet Commonly known as: PLETAL Take 100 mg by mouth 2 (two) times daily.   citalopram 40 MG tablet Commonly known as: CELEXA Take 20 mg by mouth daily.   furosemide 20 MG tablet Commonly known as: LASIX  Take 20 mg by mouth as needed (leg swelling).   glipiZIDE 5 MG tablet Commonly known as: GLUCOTROL Take 5 mg by mouth daily.   levothyroxine 50 MCG tablet Commonly known as: SYNTHROID Take 50 mcg by mouth daily before breakfast.   metFORMIN 750 MG 24 hr tablet Commonly known as: GLUCOPHAGE-XR Take 750 mg by mouth 2 (two) times daily.   metoprolol tartrate 50 MG tablet Commonly known as: LOPRESSOR Take 25 mg by mouth 2 (two) times daily.   nitroGLYCERIN 0.4 MG SL tablet Commonly known as: NITROSTAT Place 0.4 mg under the tongue every 5 (five) minutes as needed for chest pain.   polycarbophil 625 MG tablet Commonly known as: FIBERCON Take 625 mg by mouth daily.   PRESERVISION AREDS PO Take 1 tablet by mouth daily.   rosuvastatin 40 MG tablet Commonly known as: CRESTOR Take 1 tablet (40 mg total) by mouth daily. What changed: how much to take   ticagrelor 90 MG Tabs tablet Commonly known as: Brilinta Take 1 tablet (90 mg total) by mouth 2 (two) times daily. What changed: additional instructions   VITAMIN B12 PO Take 2,500 mcg by mouth daily.   VITAMIN D PO Take 1 tablet by mouth daily.          Outstanding Labs/Studies   1) Recommend CBC at time of  follow-up  2) If the patient is tolerating statin at time of follow-up appointment, would consider rechecking liver function/lipid panel in 6-8 weeks.  Duration of Discharge Encounter   Greater than 30 minutes including physician time.  Signed, Laurann Montana, PA-C 04/05/2020, 1:36 PM

## 2020-04-05 NOTE — Telephone Encounter (Signed)
° °  Hi triage, I am discharging this patient from the hospital and we had some issues identified at discharge that I just want someone to call and check on him on Monday.  Issue #1 - he has [employer] and gets his meds through the [place]. It's Saturday and they're no longer open. He is going home on new prescription for Brilinta. [facility] is unable to fill as [facility] due to high cost. :( He was not a candidate for 30 day free card due to having been on Brilinta in the past as well. Ultimately the main [place] pharmacy was able to provide him with #14 tablets (1 week supply). I sent prescription refills to the [facility] for this, as well as his increased dose of Crestor. Those are the only 2 new medicines we had to send in.  Can you a) call [facility] pharmacy on Monday to ensure they got these electronic prescriptions and b) check up on the patient that he has a gameplan for getting these picked up? His wife has chemo on Monday so I just don't want this to fall through the cracks.  Issue #2 - along that lines he follows with the [facility] for cardiac care. I told him to call them on Monday to see if he can see his cardiologist within 1-2 weeks. Can you check to make sure he was able to do so? If not, he will need an appointment arranged in our office within that same timeframe, with a CBC that day. I originally sent this to schedulers to help, but then the Brilinta issue came up and I didn't want him getting calls from multiple sources.  Thanks for your help as always, Tinzlee Craker

## 2020-04-05 NOTE — TOC Transition Note (Signed)
Transition of Care [facility]) - CM/SW Discharge Note   Patient Details  Name: CECILIO OHLRICH MRN: [id number] Date of Birth: 11/18/1945  [facility]) CM/SW Contact:  Lawerance Sabal, RN Phone Number: 04/05/2020, 1:13 PM   Clinical Narrative:   Patient will be provided with Brilinta to cover [facility]. He has already used the coupon and is not eligible for another. DC note has been [facility] Dr. Floreen Comber fax - [phone]    Final next level of care: Home/Self Care Barriers to Discharge: No Barriers Identified   Patient Goals and CMS Choice        Discharge Placement                       Discharge Plan and Services                                     Social Determinants of Health (SDOH) Interventions     Readmission Risk Interventions No flowsheet data found.

## 2020-04-05 NOTE — Progress Notes (Signed)
Progress Note  Patient Name: Luke Harvey Date of Encounter: 04/05/2020  [facility] Cardiologist: Lance Muss, MD   Subjective   No chest pain.  Inpatient Medications    Scheduled Meds: . aspirin  81 mg Oral Daily  . citalopram  20 mg Oral Daily  . enoxaparin (LOVENOX) injection  40 mg Subcutaneous Q24H  . insulin aspart  0-5 Units Subcutaneous QHS  . insulin aspart  0-9 Units Subcutaneous TID WC  . metoprolol tartrate  25 mg Oral BID  . rosuvastatin  40 mg Oral Daily  . sodium chloride flush  3 mL Intravenous Q12H  . ticagrelor  90 mg Oral BID   Continuous Infusions: . sodium chloride 100 mL/hr at 04/05/20 0152  . sodium chloride     PRN Meds: sodium chloride, acetaminophen, ALPRAZolam, menthol-cetylpyridinium, nitroGLYCERIN, ondansetron (ZOFRAN) IV, sodium chloride flush   Vital Signs    Vitals:   04/05/20 0743 04/05/20 0744 04/05/20 0800 04/05/20 0902  BP:  (!) 145/66  131/74  Pulse: 83 81 76 83  Resp: 19 19 19 16   Temp: 97.7 F (36.5 C)     TempSrc: Oral     SpO2: 96% 96% 94% 97%  Weight:      Height:        Intake/Output Summary (Last 24 hours) at 04/05/2020 1017 Last data filed at 04/05/2020 0902 Gross per 24 hour  Intake 1651.52 ml  Output 1750 ml  Net -98.48 ml   Last 3 Weights 04/04/2020 04/02/2020 04/01/2020  Weight (lbs) 177 lb 7.5 oz 183 lb 14.4 oz 185 lb  Weight (kg) 80.5 kg 83.416 kg 83.915 kg     Telemetry    Sinus - Personally Reviewed  ECG    Sinus rhythm, poor R wave progression precordial leads- Personally Reviewed  Physical Exam   GEN: No acute distress.   Neck: No JVD Cardiac: RRR, no murmurs, rubs, or gallops.  Respiratory: Clear to auscultation bilaterally. GI: Soft, nontender, non-distended  MS: No edema; No deformity. Neuro:  Nonfocal  Psych: Normal affect   Labs    High Sensitivity Troponin:   Recent Labs  Lab 03/31/20 1647 03/31/20 2044 04/01/20 1754 04/02/20 0022  TROPONINIHS 14 13 11  22*       Chemistry Recent Labs  Lab 04/03/20 0004 04/04/20 0010 04/04/20 1441 04/05/20 0028  NA 129* 128*  --  133*  K 4.2 4.2  --  4.5  CL 99 99  --  103  CO2 20* 21*  --  22  GLUCOSE 165* 151*  --  172*  BUN 13 14  --  12  CREATININE 1.07 0.97 1.04 0.98  CALCIUM 8.7* 8.7*  --  9.1  GFRNONAA >60 >60 >60 >60  ANIONGAP 10 8  --  8     Hematology Recent Labs  Lab 04/03/20 0004 04/04/20 0010 04/05/20 0028  WBC 4.4 5.8 6.7  RBC 4.29 4.59 4.36  HGB 12.6* 13.1 12.9*  HCT 35.5* 38.6* 36.7*  MCV 82.8 84.1 84.2  MCH 29.4 28.5 29.6  MCHC 35.5 33.9 35.1  RDW 13.1 12.7 13.0  PLT 195 200 195    BNPNo results for input(s): BNP, PROBNP in the last 168 hours.   DDimer No results for input(s): DDIMER in the last 168 hours.   Radiology    CARDIAC CATHETERIZATION  Result Date: 04/04/2020  Mid RCA lesion is 95% stenosed.  A stent was successfully placed.  Prox RCA lesion is 50% stenosed.  Post intervention, there  is a 0% residual stenosis.  Prox RCA to Mid RCA lesion is 85% stenosed.  Post intervention, there is a 0% residual stenosis.  Post intervention, there is a 0% residual stenosis.  Difficult but ultimately successful complex coronary intervention to a significantly calcified proximal to mid RCA with stenoses of 50 to 80% with vessel angulation and 95% stenosis in the previously placed sandwich stents in the distal RCA.  The proximal to mid stenoses was successfully treated with extensive CSI orbital atherectomy shockwave intravascular lithotripsy (3.0 x12 mm) and ultimate and stenting with a 3.0 x 34 mm Resolute DES stent postdilated to 3.25 mm with the entire region of stenoses being reduced to 0%. The distal RCA in-stent restenosis from the 2015 in 2018 stents was successfully treated with noncompliant balloon dilatation, and shockwave intravascular lithotripsy (3.0 x 12) with the 95% stenosis being reduced to 0%. Adjunctive use of intravascular ultrasound. Temporary pacemaker  inserted prior to atherectomy of a large RCA vessel with removal of pacemaker at the completion of the procedure. RECOMMENDATION: Long term DAPT.  Optimal blood pressure control.  Aggressive lipid-lowering therapy with target LDL less than 70.   Cardiac Studies    Patient Profile     74 y.o. male with history of CAD s/p 2V CABG in 2014 with subsequent PCI in 2015 and 2018, DM, HTN, HLD, prior tobacco abuse, PAD and hypothyroidism who was admitted with chest pain concerning for unstable angina. 2V CABG in [place] in 2014. Stent placement in RCA in 2015. Admitted to [facility] February 2018 with a NSTEMI. Severe restenosis mid RCA stent in 2018 treated with a drug eluting stent. Cardiac cath 04/02/20 with severe vein graft stenoses treated with 2 drug eluting stents. Residual high grade disease in the RCA.   Assessment & Plan    1. CAD with unstable angina: Cardiac cath on 12/8 with severe disease in the ostium and mid body of the vein graft to the OM treated with drug eluting stents. Residual high grade, heavily calcified disease in the mid RCA and restenosis vs stent fracture in the distal RCA stent.   S/P Difficult but ultimately successful complex coronary intervention to a significantly calcified proximal to mid RCA with stenoses of 50 to 80% with vessel angulation and 95% stenosis in the previously placed sandwich stents in the distal RCA.    The proximal to mid stenoses was successfully treated with extensive CSI orbital atherectomy shockwave intravascular lithotripsy (3.0 x12 mm) and ultimate and stenting with a 3.0 x 34 mm Resolute DES stent postdilated to 3.25 mm with the entire region of stenoses being reduced to 0%.  The distal RCA in-stent restenosis from the 2015 in 2018 stents was successfully treated with noncompliant balloon dilatation, and shockwave intravascular lithotripsy (3.0 x 12) with the 95% stenosis being reduced to 0%  RECOMMENDATION: Long term DAPT.  Optimal blood pressure  control.  Aggressive lipid-lowering therapy with target LDL less than 70.  Doing well this am. No chest pain. No bruit or bleeding in the right groin and above left radial artery.   -Continue DAPT with ASA and Brilinta for at least 12 months -Continue beta blocker and statin.  -we will discharge him home today and arrange for a follow up with the next two weeks.  - Crea normal post cath  2. Hypertension - controlled  3. Hyperlipidemia - on high dose rosuvastatin   For questions or updates, please contact [facility] Please consult [email or web address] for contact info under  Signed, Tobias Alexander, MD  04/05/2020, 10:17 AM

## 2020-04-05 NOTE — Progress Notes (Signed)
CARDIAC REHAB PHASE I   PRE:  Rate/Rhythm: 70 SR  BP:  Supine:   Sitting: 131/84     SaO2: 95% RA  MODE:  Ambulation: 300 ft   POST:  Rate/Rhythm: 91 SR  BP:  Supine:   Sitting: 148/68    SaO2: 98% RA  Pt was lying in bed and was independent with getting out of bed. Pt ambulated independently for 300 ft on RA with minor complaints of SOB. Pt states he has SOB with any type of exertion. Returned pt to EOB with significant other by bedside. Reinforced importance of Brilinta with pt and significant other. Pt and significant other were receptive.  [id number]  Norris Cross, MS, CEP 04/05/2020

## 2020-04-07 ENCOUNTER — Encounter (HOSPITAL_COMMUNITY): Payer: Self-pay | Admitting: Cardiovascular Disease

## 2020-04-07 LAB — POCT ACTIVATED CLOTTING TIME: Activated Clotting Time: 583 seconds

## 2020-04-07 NOTE — Telephone Encounter (Signed)
Left message for the pt to call back will try again later this morning.

## 2020-04-09 ENCOUNTER — Telehealth (HOSPITAL_COMMUNITY): Payer: Self-pay

## 2020-04-09 NOTE — Telephone Encounter (Signed)
Call placed to Pt.  Pt was able to get his medications from the [facility] pharmacy.  [facility]/20.  Advised Pt to call office if he had any further needs.

## 2020-04-09 NOTE — Telephone Encounter (Signed)
Attempted to call patient in regards to Cardiac Rehab - LM on VM 

## 2020-04-15 ENCOUNTER — Telehealth (HOSPITAL_COMMUNITY): Payer: Self-pay

## 2020-04-15 ENCOUNTER — Encounter (HOSPITAL_COMMUNITY): Payer: Self-pay

## 2020-04-15 ENCOUNTER — Encounter (HOSPITAL_COMMUNITY): Payer: Self-pay | Admitting: *Deleted

## 2020-04-15 NOTE — Progress Notes (Signed)
Received [facility] Authorization [id number] from Lorelle Gibbs at the [facility].  Pt is s/p 04/02/20 DES to [facility] per Dr. Clifton James with [facility].  Reviewed authorization which is missing the follow up appt from 12/17 with the cardiology clinic.  Called and spoke to community care coordinator Gold with the request for this progress note to review for scheduling. No recent 12 lead ekg completed at [facility] facility. Can use ekg from [facility] for comparison. Await requested documents. Alanson Aly, BSN Cardiac and Emergency planning/management officer

## 2020-04-15 NOTE — Telephone Encounter (Signed)
Attempted to call patient in regards to Cardiac Rehab - LM on VM Mailed letter 

## 2020-04-22 ENCOUNTER — Telehealth (HOSPITAL_COMMUNITY): Payer: Self-pay

## 2020-04-22 NOTE — Telephone Encounter (Signed)
Called [facility] [place] to advised that when pt is scheduled so they can update his auth. For cardiac rehab.

## 2020-05-12 ENCOUNTER — Telehealth (HOSPITAL_COMMUNITY): Payer: Self-pay | Admitting: Pharmacy Technician

## 2020-05-19 ENCOUNTER — Telehealth (HOSPITAL_COMMUNITY): Payer: Self-pay

## 2020-05-19 NOTE — Telephone Encounter (Signed)
Cardiac Rehab Telephone Note:  Unsuccessful telephone encounter to [name] to confirm cardiac rehab orientation appointment for Thursday, 05/22/20 at 10:00 am. [employer] compliant VM message left requesting cal back to [phone].  Dewaine Morocho E. Suzie Portela RN, BSN . [facility]  Cardiac and Pulmonary Rehabilitation Phone: [phone] Fax: [phone]

## 2020-05-21 NOTE — Telephone Encounter (Signed)
Cardiac Rehab - Pharmacy Resident Documentation   Patient unable to be reached after three call attempts. Please complete allergy verification and medication review during patient's cardiac rehab appointment.     

## 2020-05-22 ENCOUNTER — Other Ambulatory Visit: Payer: Self-pay

## 2020-05-22 ENCOUNTER — Encounter (HOSPITAL_COMMUNITY): Payer: Self-pay

## 2020-05-22 ENCOUNTER — Encounter (HOSPITAL_COMMUNITY)
Admission: RE | Admit: 2020-05-22 | Discharge: 2020-05-22 | Disposition: A | Discharge status: Home / Self Care | Payer: No Typology Code available for payment source | Source: Ambulatory Visit | Attending: Cardiology | Admitting: Cardiology

## 2020-05-22 VITALS — BP 138/68 | Ht 66.25 in | Wt 179.0 lb

## 2020-05-22 DIAGNOSIS — Z955 Presence of coronary angioplasty implant and graft: Secondary | ICD-10-CM | POA: Insufficient documentation

## 2020-05-22 NOTE — Progress Notes (Signed)
Cardiac Individual Treatment Plan  Patient Details  Name: Luke Harvey MRN: [id number] Date of Birth: [birth year] Referring Provider:   Flowsheet Row CARDIAC REHAB PHASE II ORIENTATION from 05/22/2020 in [facility] CARDIAC [facility]  Referring Provider Lorelle Gibbs [facility] Armanda Magic MD Covering      Initial Encounter Date:  Flowsheet Row CARDIAC REHAB PHASE II ORIENTATION from 05/22/2020 in [facility] CARDIAC REHAB  Date 05/22/20      Visit Diagnosis: S/P DES SVG to CFX OM 04/02/20  Patient's Home Medications on Admission:  Current Outpatient Medications:  .  acetaminophen (TYLENOL) 325 MG tablet, Take 2 tablets (650 mg total) by mouth every 4 (four) hours as needed for headache or mild pain., Disp: , Rfl:  .  ALPRAZolam (XANAX) 0.5 MG tablet, Take 0.25 mg by mouth 2 (two) times daily as needed for sleep. , Disp: , Rfl:  .  aspirin EC 81 MG tablet, Take 81 mg by mouth daily., Disp: , Rfl:  .  cilostazol (PLETAL) 100 MG tablet, Take 100 mg by mouth 2 (two) times daily. , Disp: , Rfl:  .  citalopram (CELEXA) 40 MG tablet, Take 20 mg by mouth daily. , Disp: , Rfl:  .  Cyanocobalamin (VITAMIN B12 PO), Take 2,500 mcg by mouth daily., Disp: , Rfl:  .  furosemide (LASIX) 20 MG tablet, Take 20 mg by mouth as needed (leg swelling)., Disp: , Rfl:  .  glipiZIDE (GLUCOTROL) 5 MG tablet, Take 5 mg by mouth daily. , Disp: , Rfl:  .  levothyroxine (SYNTHROID, LEVOTHROID) 50 MCG tablet, Take 50 mcg by mouth daily before breakfast., Disp: , Rfl:  .  metoprolol (LOPRESSOR) 50 MG tablet, Take 25 mg by mouth 2 (two) times daily. , Disp: , Rfl:  .  Multiple Vitamins-Minerals (PRESERVISION AREDS PO), Take 1 tablet by mouth daily., Disp: , Rfl:  .  nitroGLYCERIN (NITROSTAT) 0.4 MG SL tablet, Place 0.4 mg under the tongue every 5 (five) minutes as needed for chest pain., Disp: , Rfl:  .  polycarbophil (FIBERCON) 625 MG tablet, Take 625 mg by mouth daily.,  Disp: , Rfl:  .  rosuvastatin (CRESTOR) 40 MG tablet, Take 1 tablet (40 mg total) by mouth daily., Disp: 30 tablet, Rfl: 11 .  ticagrelor (BRILINTA) 90 MG TABS tablet, Take 1 tablet (90 mg total) by mouth 2 (two) times daily., Disp: 60 tablet, Rfl: 11 .  VITAMIN D PO, Take 1 tablet by mouth daily., Disp: , Rfl:  .  metFORMIN (GLUCOPHAGE-XR) 750 MG 24 hr tablet, Take 750 mg by mouth 2 (two) times daily., Disp: , Rfl:   Past Medical History: Past Medical History:  Diagnosis Date  . Age-related macular degeneration, dry, both eyes   . Anxiety   . Aortic stenosis   . Arthritis    "probably all over" (06/08/2016)  . Cardiomyopathy (HCC)    a. EF 40-45% in 2018, normalized in 03/2020.  Marland Kitchen Chronic back pain    "started in my lower back; going up my back in the last couple months" (06/08/2016)  . Chronic sinusitis   . Coronary artery disease    a. s/p CABGx 2V (2014 in [place])  b. PCI (2015 in [place]). c. PCI 2018 ([facility]). d. PCI 03/2020 (complex - [facility]).  . Diabetic peripheral neuropathy (HCC)   . DVT (deep venous thrombosis) (HCC)    "left groin; it was there when I had bypass OR; get it checked q yr; still there  now" (06/08/2016)  . GERD (gastroesophageal reflux disease)   . Hepatitis B   . History of bleeding ulcers   . History of diverticulitis   . History of hiatal hernia   . HLD (hyperlipidemia)   . HTN (hypertension)   . 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 06/09/2016  . NSTEMI (non-ST elevated myocardial infarction) (HCC) 06/08/2016  . Pneumonia    "3-4 times maybe" (06/08/2016)  . PVD (peripheral vascular disease) (HCC)   . Recovering alcoholic (HCC)    "picked up my 30 year chip the other day" (06/08/2016)  . S/P angioplasty with stent, 06/08/16 DES, for in-stent restenosis in RCA. 06/09/2016  . Sleep apnea    "got a mask after OR; don't use it" (06/08/2016)  . Type II diabetes mellitus (HCC)     Tobacco Use: Social History   Tobacco Use  Smoking Status Former Smoker  .  Packs/day: 1.00  . Years: 40.00  . Pack years: 40.00  . Quit date: 07/17/2008  . Years since quitting: 11.8  Smokeless Tobacco Never Used    Labs: Recent Review Advice worker    Labs for ITP Cardiac and Pulmonary Rehab Latest Ref Rng & Units 06/08/2016 06/09/2016 04/02/2020   Cholestrol 0 - 200 mg/dL - 161 096   LDLCALC 0 - 99 mg/dL - 61 74   HDL >04 mg/dL - 54(U) 98(J)   Trlycerides <150 mg/dL - 191 85   Hemoglobin Y7W 4.8 - 5.6 % 7.4(H) - -      Capillary Blood Glucose: Lab Results  Component Value Date   GLUCAP 233 (H) 04/05/2020   GLUCAP 143 (H) 04/05/2020   GLUCAP 210 (H) 04/04/2020   GLUCAP 156 (H) 04/04/2020   GLUCAP 134 (H) 04/04/2020     Exercise Target Goals: Exercise Program Goal: Individual exercise prescription set using results from initial 6 min walk test and THRR while considering  patient's activity barriers and safety.   Exercise Prescription Goal: Starting with aerobic activity 30 plus minutes a day, 3 days per week for initial exercise prescription. Provide home exercise prescription and guidelines that participant acknowledges understanding prior to discharge.  Activity Barriers & Risk Stratification:  Activity Barriers & Cardiac Risk Stratification - 05/22/20 1214      Activity Barriers & Cardiac Risk Stratification   Activity Barriers Left Hip Replacement;Right Hip Replacement;Deconditioning;Shortness of Breath           6 Minute Walk:  6 Minute Walk    Row Name 05/22/20 1213         6 Minute Walk   Phase Initial     Distance 837 feet     Walk Time 6 minutes     # of Rest Breaks 0     MPH 1.58     METS 1.98     RPE 15     VO2 Peak 6.92     Symptoms Yes (comment)     Comments Right and Left Hip pain and shortness of breath     Resting HR 78 bpm     Resting BP 138/68     Resting Oxygen Saturation  98 %     Exercise Oxygen Saturation  during 6 min walk 98 %     Max Ex. HR 99 bpm     Max Ex. BP 172/66     2 Minute Post BP 150/68   Re-check 122/70            Oxygen Initial Assessment:   Oxygen Re-Evaluation:  Oxygen Discharge (Final Oxygen Re-Evaluation):   Initial Exercise Prescription:  Initial Exercise Prescription - 05/22/20 1300      Date of Initial Exercise RX and Referring Provider   Date 05/22/20    Referring Provider Lorelle Gibbs [facility] Armanda Magic MD Covering    Expected Discharge Date 07/18/20      NuStep   Level 1    SPM 80    Minutes 30    METs 1.7      Prescription Details   Frequency (times per week) 3    Duration Progress to 30 minutes of continuous aerobic without signs/symptoms of physical distress      Intensity   THRR 40-80% of Max Heartrate 58-115    Ratings of Perceived Exertion 11-15    Perceived Dyspnea 0-4      Progression   Progression Continue progressive overload as per policy without signs/symptoms or physical distress.      Resistance Training   Training Prescription Yes    Weight 2    Reps 10-15           Perform Capillary Blood Glucose checks as needed.  Exercise Prescription Changes:   Exercise Comments:   Exercise Goals and Review:  Exercise Goals    Row Name 05/22/20 1215             Exercise Goals   Increase Physical Activity Yes       Intervention Provide advice, education, support and counseling about physical activity/exercise needs.;Develop an individualized exercise prescription for aerobic and resistive training based on initial evaluation findings, risk stratification, comorbidities and participant's personal goals.       Expected Outcomes Short Term: Attend rehab on a regular basis to increase amount of physical activity.;Long Term: Add in home exercise to make exercise part of routine and to increase amount of physical activity.;Long Term: Exercising regularly at least 3-5 days a week.       Increase Strength and Stamina Yes       Intervention Provide advice, education, support and counseling about physical  activity/exercise needs.;Develop an individualized exercise prescription for aerobic and resistive training based on initial evaluation findings, risk stratification, comorbidities and participant's personal goals.       Expected Outcomes Short Term: Increase workloads from initial exercise prescription for resistance, speed, and METs.;Short Term: Perform resistance training exercises routinely during rehab and add in resistance training at home;Long Term: Improve cardiorespiratory fitness, muscular endurance and strength as measured by increased METs and functional capacity ( )       Able to understand and use rate of perceived exertion (RPE) scale Yes       Intervention Provide education and explanation on how to use RPE scale       Expected Outcomes Short Term: Able to use RPE daily in rehab to express subjective intensity level;Long Term:  Able to use RPE to guide intensity level when exercising independently       Able to understand and use Dyspnea scale Yes       Intervention Provide education and explanation on how to use Dyspnea scale       Expected Outcomes Short Term: Able to use Dyspnea scale daily in rehab to express subjective sense of shortness of breath during exertion;Long Term: Able to use Dyspnea scale to guide intensity level when exercising independently       Knowledge and understanding of Target Heart Rate Range (THRR) Yes       Intervention Provide education and explanation  of THRR including how the numbers were predicted and where they are located for reference       Expected Outcomes Short Term: Able to state/look up THRR;Long Term: Able to use THRR to govern intensity when exercising independently;Short Term: Able to use daily as guideline for intensity in rehab       Understanding of Exercise Prescription Yes       Intervention Provide education, explanation, and written materials on patient's individual exercise prescription       Expected Outcomes Short Term: Able to  explain program exercise prescription;Long Term: Able to explain home exercise prescription to exercise independently              Exercise Goals Re-Evaluation :    Discharge Exercise Prescription (Final Exercise Prescription Changes):   Nutrition:  Target Goals: Understanding of nutrition guidelines, daily intake of sodium 1500mg , cholesterol 200mg , calories 30% from fat and 7% or less from saturated fats, daily to have 5 or more servings of fruits and vegetables.  Biometrics:  Pre Biometrics - 05/22/20 1216      Pre Biometrics   Height 5' 6.25" (1.683 m)    Weight 81.2 kg    Waist Circumference 21 inches    Hip Circumference 43 inches    Waist to Hip Ratio 0.49 %    BMI (Calculated) 28.67    Triceps Skinfold 9 mm    % Body Fat 17.2 %    Grip Strength 34 kg    Flexibility 13 in    Single Leg Stand 30 seconds            Nutrition Therapy Plan and Nutrition Goals:   Nutrition Assessments:  MEDIFICTS Score Key:  ?70 Need to make dietary changes   40-70 Heart Healthy Diet  ? 40 Therapeutic Level Cholesterol Diet   Picture Your Plate Scores:  <40 Unhealthy dietary pattern with much room for improvement.  41-50 Dietary pattern unlikely to meet recommendations for good health and room for improvement.  51-60 More healthful dietary pattern, with some room for improvement.   >60 Healthy dietary pattern, although there may be some specific behaviors that could be improved.    Nutrition Goals Re-Evaluation:   Nutrition Goals Discharge (Final Nutrition Goals Re-Evaluation):   Psychosocial: Target Goals: Acknowledge presence or absence of significant depression and/or stress, maximize coping skills, provide positive support system. Participant is able to verbalize types and ability to use techniques and skills needed for reducing stress and depression.  Initial Review & Psychosocial Screening:  Initial Psych Review & Screening - 05/22/20 1149       Initial Review   Current issues with History of Depression      Family Dynamics   Good Support System? Yes   Ahmani lives alone. Mardy has his girl friend and two children for support   Comments Avrum says he has taken an antidepressant for 5 years and he is not sure why      Screening Interventions   Interventions Encouraged to exercise;Provide feedback about the scores to participant    Expected Outcomes Long Term Goal: Stressors or current issues are controlled or eliminated.;Short Term goal: Identification and review with participant of any Quality of Life or Depression concerns found by scoring the questionnaire.;Long Term goal: The participant improves quality of Life and PHQ9 Scores as seen by post scores and/or verbalization of changes           Quality of Life Scores:  Quality of Life - 05/22/20 1217  Quality of Life   Select Quality of Life      Quality of Life Scores   Health/Function Pre 21.5 %    Socioeconomic Pre 22.93 %    Psych/Spiritual Pre 23.57 %    Family Pre 21.3 %    GLOBAL Pre 22.21 %          Scores of 19 and below usually indicate a poorer quality of life in these areas.  A difference of  2-3 points is a clinically meaningful difference.  A difference of 2-3 points in the total score of the Quality of Life Index has been associated with significant improvement in overall quality of life, self-image, physical symptoms, and general health in studies assessing change in quality of life.  PHQ-9: Recent Review Flowsheet Data    [facility] 05/22/2020 11/01/2012 07/17/2012   Decreased Interest 1 2 0   Down, Depressed, Hopeless 0 0 0   PHQ - 2 Score 1 2 0   Altered sleeping - 0 -   Tired, decreased energy - 3 -   Change in appetite - 0 -   Feeling bad or failure about yourself  - 0 -   Trouble concentrating - 0 -   Moving slowly or fidgety/restless - 0 -   Suicidal thoughts - 0 -   PHQ-9 Score - 5 -     Interpretation of Total Score   Total Score Depression Severity:  1-4 = Minimal depression, 5-9 = Mild depression, 10-14 = Moderate depression, 15-19 = Moderately severe depression, 20-27 = Severe depression   Psychosocial Evaluation and Intervention:   Psychosocial Re-Evaluation:   Psychosocial Discharge (Final Psychosocial Re-Evaluation):   Vocational Rehabilitation: Provide vocational rehab assistance to qualifying candidates.   Vocational Rehab Evaluation & Intervention:  Vocational Rehab - 05/22/20 1211      Initial Vocational Rehab Evaluation & Intervention   Assessment shows need for Vocational Rehabilitation No   Peyton NajjarLarry is retired and does not need vocational rehab at this time          Education: Education Goals: Education classes will be provided on a weekly basis, covering required topics. Participant will state understanding/return demonstration of topics presented.  Learning Barriers/Preferences:  Learning Barriers/Preferences - 05/22/20 1210      Learning Barriers/Preferences   Learning Barriers Hearing;Sight   wears glasses, very hard of hearing   Learning Preferences Pictoral;Video           Education Topics: Hypertension, Hypertension Reduction -Define heart disease and high blood pressure. Discus how high blood pressure affects the body and ways to reduce high blood pressure.   Exercise and Your Heart -Discuss why it is important to exercise, the FITT principles of exercise, normal and abnormal responses to exercise, and how to exercise safely.   Angina -Discuss definition of angina, causes of angina, treatment of angina, and how to decrease risk of having angina.   Cardiac Medications -Review what the following cardiac medications are used for, how they affect the body, and side effects that may occur when taking the medications.  Medications include Aspirin, Beta blockers, calcium channel blockers, ACE Inhibitors, angiotensin receptor blockers, diuretics, digoxin, and  antihyperlipidemics.   Congestive Heart Failure -Discuss the definition of CHF, how to live with CHF, the signs and symptoms of CHF, and how keep track of weight and sodium intake.   Heart Disease and Intimacy -Discus the effect sexual activity has on the heart, how changes occur during intimacy as we age, and safety  during sexual activity.   Smoking Cessation / COPD -Discuss different methods to quit smoking, the health benefits of quitting smoking, and the definition of COPD.   Nutrition I: Fats -Discuss the types of cholesterol, what cholesterol does to the heart, and how cholesterol levels can be controlled.   Nutrition II: Labels -Discuss the different components of food labels and how to read food label   Heart Parts/Heart Disease and PAD -Discuss the anatomy of the heart, the pathway of blood circulation through the heart, and these are affected by heart disease.   Stress I: Signs and Symptoms -Discuss the causes of stress, how stress may lead to anxiety and depression, and ways to limit stress.   Stress II: Relaxation -Discuss different types of relaxation techniques to limit stress.   Warning Signs of Stroke / TIA -Discuss definition of a stroke, what the signs and symptoms are of a stroke, and how to identify when someone is having stroke.   Knowledge Questionnaire Score:  Knowledge Questionnaire Score - 05/22/20 1214      Knowledge Questionnaire Score   Pre Score 20/24           Core Components/Risk Factors/Patient Goals at Admission:  Personal Goals and Risk Factors at Admission - 05/22/20 1212      Core Components/Risk Factors/Patient Goals on Admission    Weight Management Yes;Weight Maintenance;Weight Gain    Intervention Weight Management/Obesity: Establish reasonable short term and long term weight goals.;Weight Management: Provide education and appropriate resources to help participant work on and attain dietary goals.;Weight Management: Develop  a combined nutrition and exercise program designed to reach desired caloric intake, while maintaining appropriate intake of nutrient and fiber, sodium and fats, and appropriate energy expenditure required for the weight goal.    Expected Outcomes Short Term: Continue to assess and modify interventions until short term weight is achieved;Long Term: Adherence to nutrition and physical activity/exercise program aimed toward attainment of established weight goal;Weight Loss: Understanding of general recommendations for a balanced deficit meal plan, which promotes 1-2 lb weight loss per week and includes a negative energy balance of [phone] kcal/d;Understanding recommendations for meals to include 15-35% energy as protein, 25-35% energy from fat, 35-60% energy from carbohydrates, less than 200mg  of dietary cholesterol, 20-35 gm of total fiber daily;Understanding of distribution of calorie intake throughout the day with the consumption of 4-5 meals/snacks    Diabetes Yes    Intervention Provide education about signs/symptoms and action to take for hypo/hyperglycemia.;Provide education about proper nutrition, including hydration, and aerobic/resistive exercise prescription along with prescribed medications to achieve blood glucose in normal ranges: Fasting glucose 65-99 mg/dL    Expected Outcomes Short Term: Participant verbalizes understanding of the signs/symptoms and immediate care of hyper/hypoglycemia, proper foot care and importance of medication, aerobic/resistive exercise and nutrition plan for blood glucose control.;Long Term: Attainment of HbA1C < 7%.    Hypertension Yes    Intervention Provide education on lifestyle modifcations including regular physical activity/exercise, weight management, moderate sodium restriction and increased consumption of fresh fruit, vegetables, and low fat dairy, alcohol moderation, and smoking cessation.;Monitor prescription use compliance.    Expected Outcomes Short Term:  Continued assessment and intervention until BP is < 140/4mm HG in hypertensive participants. < 130/52mm HG in hypertensive participants with diabetes, heart failure or chronic kidney disease.;Long Term: Maintenance of blood pressure at goal levels.    Lipids Yes    Intervention Provide education and support for participant on nutrition & aerobic/resistive exercise along with prescribed medications to  achieve LDL 70mg , HDL >40mg .    Expected Outcomes Short Term: Participant states understanding of desired cholesterol values and is compliant with medications prescribed. Participant is following exercise prescription and nutrition guidelines.;Long Term: Cholesterol controlled with medications as prescribed, with individualized exercise RX and with personalized nutrition plan. Value goals: LDL < 70mg , HDL > 40 mg.           Core Components/Risk Factors/Patient Goals Review:    Core Components/Risk Factors/Patient Goals at Discharge (Final Review):    ITP Comments:  ITP Comments    Row Name 05/22/20 1054           ITP Comments Dr 05/24/20 MD, Medical Director              Comments: Armanda Magic attended orientation on 05/22/2020 to review rules and guidelines for program.  Completed 6 minute walk test, Intitial ITP, and exercise prescription.  VSS. Telemetry-Sinus Rhythm. Triston walked at a slow pace during the walk test and reported that he felt a little unsteady toward the end of his walk test. Peyton Najjar reported having left hip discomfort 7/10 and mild shortness of breath this resolved after rest.. Safety measures and social distancing in place per CDC guidelines.Peyton Najjar, RN,BSN 05/22/2020 3:04 PM

## 2020-05-22 NOTE — Progress Notes (Signed)
Cardiac Rehab Medication Review by a Nurse  Does the patient  feel that his/her medications are working for him/her?  yes  Has the patient been experiencing any side effects to the medications prescribed?  no  Does the patient measure his/her own blood pressure or blood glucose at home?  yes   Does the patient have any problems obtaining medications due to transportation or finances?   no  Understanding of regimen: fair Understanding of indications: fair Potential of compliance: good    Nurse comments: Reviewed Luke Harvey's medications. Patient brought list from home. Luke Harvey will update metformin dose as he thinks he is taking 500 mg twice a day. Luke Harvey is taking his medications as prescribed. Luke Harvey checks his blood pressure and CBG's at home.   [name] RN BSN 05/22/2020 11:41 AM

## 2020-05-26 ENCOUNTER — Encounter (HOSPITAL_COMMUNITY)
Admission: RE | Admit: 2020-05-26 | Discharge: 2020-05-26 | Disposition: A | Discharge status: Home / Self Care | Payer: No Typology Code available for payment source | Source: Ambulatory Visit | Attending: Cardiology | Admitting: Cardiology

## 2020-05-26 ENCOUNTER — Other Ambulatory Visit: Payer: Self-pay

## 2020-05-26 DIAGNOSIS — Z955 Presence of coronary angioplasty implant and graft: Secondary | ICD-10-CM | POA: Diagnosis not present

## 2020-05-26 LAB — GLUCOSE, CAPILLARY: Glucose-Capillary: 80 mg/dL (ref 70–99)

## 2020-05-26 NOTE — Progress Notes (Signed)
Incomplete Session Note  Patient Details  Name: Luke Harvey MRN: [id number] Date of Birth: [birth year] Referring Provider:   Flowsheet Row CARDIAC REHAB PHASE II ORIENTATION from 05/22/2020 in [facility]  Referring Provider [name]      [name] [name]  CBG 80 this afternoon. Patient ate some peanut butter prior to coming to cardiac rehab but did not eat lunch.  Patient was counseled by the dietitian and advised to eat breakfast or lunch before coming to exercise as the patient says he does not get up until 11:00 am. Patient states understanding. Blood pressure 128/62 heart rate 74. Telemetry rhythm Sinus with occasional PVC's. [name] plans to return to exercise on Wednesday.[name], RN,BSN 05/26/2020 3:04 PM

## 2020-05-28 ENCOUNTER — Other Ambulatory Visit: Payer: Self-pay

## 2020-05-28 ENCOUNTER — Encounter (HOSPITAL_COMMUNITY): Payer: No Typology Code available for payment source

## 2020-05-28 ENCOUNTER — Encounter (HOSPITAL_COMMUNITY)
Admission: RE | Admit: 2020-05-28 | Discharge: 2020-05-28 | Disposition: A | Discharge status: Home / Self Care | Payer: No Typology Code available for payment source | Source: Ambulatory Visit | Attending: Cardiology | Admitting: Cardiology

## 2020-05-28 DIAGNOSIS — Z955 Presence of coronary angioplasty implant and graft: Secondary | ICD-10-CM | POA: Diagnosis not present

## 2020-05-28 LAB — GLUCOSE, CAPILLARY
Glucose-Capillary: 130 mg/dL — ABNORMAL HIGH (ref 70–99)
Glucose-Capillary: 82 mg/dL (ref 70–99)

## 2020-05-28 NOTE — Progress Notes (Signed)
Pt finished his snack of peanut butter, crackers and gingerale.  Waited 15 minutes rechecked blood glucose 130.  Pt denies any complaints of "jitterness".  Pt has poor understanding of diabetes management which is compounded by his impaired hearing despite wearing aids.  Stressed the importance of eating meals which contain protein when taking his medication for diabetes.  Will follow up with pt on Friday. Luke Harvey, BSN Cardiac and Emergency planning/management officer

## 2020-05-28 NOTE — Progress Notes (Signed)
Incomplete Session Note  Patient Details  Name: Luke Harvey MRN: [id number] Date of Birth: 21-Jul-1945 Referring Provider:   Flowsheet Row CARDIAC REHAB PHASE II ORIENTATION from 05/22/2020 in [facility] CARDIAC [place]  Referring Provider Lorelle Gibbs [facility] Kathryne Sharper [facility] Armanda Magic MD Covering      [name] [name] did not complete his rehab session.  CBG 82 this afternoon. Patient ate oatmeal at at 12:45. Patient had a boost, an apple and peanut butter. Patient brought metformin bottle with him he is 750 mg XR twice a day. [facility]. Patient advised not to exercise today.[name], RN,BSN 05/28/2020 3:12 PM

## 2020-05-30 ENCOUNTER — Encounter (HOSPITAL_COMMUNITY)
Admission: RE | Admit: 2020-05-30 | Discharge: 2020-05-30 | Disposition: A | Discharge status: Home / Self Care | Payer: No Typology Code available for payment source | Source: Ambulatory Visit | Attending: Cardiology | Admitting: Cardiology

## 2020-05-30 ENCOUNTER — Other Ambulatory Visit: Payer: Self-pay

## 2020-05-30 DIAGNOSIS — Z955 Presence of coronary angioplasty implant and graft: Secondary | ICD-10-CM

## 2020-05-30 NOTE — Progress Notes (Signed)
Daily Session Note  Patient Details  Name: Luke Harvey MRN: [id number] Date of Birth: 1945-06-16 Referring Provider:   Flowsheet Row CARDIAC REHAB PHASE II ORIENTATION from 05/22/2020 in [place]  Referring Provider Filbert Berthold [facility] Jule Ser [facility] Fransico Him MD Covering      Encounter Date: 05/30/2020  Check In:  Session Check In - 05/30/20 1343      Check-In   Supervising physician immediately available to respond to emergencies [facility] immediately available    Physician(s) Dr. Eliseo Squires    Location [facility]    Staff Present Leda Roys, MS, ACSM-CEP, Exercise Physiologist;Olinty Celesta Aver, MS, ACSM CEP, Exercise Physiologist;Annedrea Rosezella [place], RN, MHA;Alain Deschene, RN, BSN    Virtual Visit No    Medication changes reported     No    Fall or balance concerns reported    No    Tobacco Cessation No Change    Warm-up and Cool-down Performed on first and last piece of equipment    Resistance Training Performed Yes    VAD Patient? No    PAD/SET Patient? No      Pain Assessment   Currently in Pain? No/denies    Pain Score 0-No pain    Multiple Pain Sites No           Capillary Blood Glucose: No results found for this or any previous visit (from the past 24 hour(s)).   Exercise Prescription Changes - 05/30/20 1342      Response to Exercise   Blood Pressure (Admit) 124/62    Blood Pressure (Exercise) 134/62    Blood Pressure (Exit) 116/60    Heart Rate (Admit) 100 bpm    Heart Rate (Exercise) 107 bpm    Heart Rate (Exit) 100 bpm    Rating of Perceived Exertion (Exercise) 11    Symptoms none    Comments Off to a good start with exercise.    Duration Continue with 30 min of aerobic exercise without signs/symptoms of physical distress.    Intensity THRR unchanged      Progression   Progression Continue to progress workloads to maintain intensity without signs/symptoms of physical distress.     Average METs 1.8      Resistance Training   Training Prescription Yes    Weight 2    Reps 10-15    Time 10 Minutes      Interval Training   Interval Training No      NuStep   Level 1    SPM 80    Minutes 25    METs 1.8           Social History   Tobacco Use  Smoking Status Former Smoker  . Packs/day: 1.00  . Years: 40.00  . Pack years: 40.00  . Quit date: 07/17/2008  . Years since quitting: 11.8  Smokeless Tobacco Never Used    Goals Met:  Exercise tolerated well No report of cardiac concerns or symptoms Strength training completed today  Goals Unmet:  Not Applicable  Comments: [name] started cardiac rehab today.  Pt tolerated light exercise without difficulty. VSS, telemetry-Sinus Rhythm, asymptomatic.  Medication list reconciled. Pt denies barriers to medicaiton compliance.  PSYCHOSOCIAL ASSESSMENT:  PHQ-0. Pt exhibits positive coping skills, hopeful outlook with supportive family. No psychosocial needs identified at this time, no psychosocial interventions necessary.    Pt enjoys spending time on the computer and watching TV.   Pt oriented to exercise equipment and routine.  Understanding verbalized.Barnet Pall, RN,BSN 05/30/2020 4:33 PM   Dr. Fransico Him is Medical Director for Cardiac Rehab at [facility].

## 2020-06-02 ENCOUNTER — Encounter (HOSPITAL_COMMUNITY)
Admission: RE | Admit: 2020-06-02 | Discharge: 2020-06-02 | Disposition: A | Discharge status: Home / Self Care | Payer: No Typology Code available for payment source | Source: Ambulatory Visit | Attending: Cardiology | Admitting: Cardiology

## 2020-06-02 ENCOUNTER — Other Ambulatory Visit: Payer: Self-pay

## 2020-06-02 DIAGNOSIS — Z955 Presence of coronary angioplasty implant and graft: Secondary | ICD-10-CM | POA: Diagnosis not present

## 2020-06-02 LAB — GLUCOSE, CAPILLARY
Glucose-Capillary: 223 mg/dL — ABNORMAL HIGH (ref 70–99)
Glucose-Capillary: 147 mg/dL — ABNORMAL HIGH (ref 70–99)
Glucose-Capillary: 203 mg/dL — ABNORMAL HIGH (ref 70–99)

## 2020-06-02 NOTE — Progress Notes (Addendum)
Luke Harvey 75 y.o. male Nutrition Note   Visit Diagnosis: S/P DES SVG to CFX OM 04/02/20   Past Medical History:  Diagnosis Date  . Age-related macular degeneration, dry, both eyes   . Anxiety   . Aortic stenosis   . Arthritis    "probably all over" (06/08/2016)  . Cardiomyopathy (HCC)    a. EF 40-45% in 2018, normalized in 03/2020.  Marland Kitchen Chronic back pain    "started in my lower back; going up my back in the last couple months" (06/08/2016)  . Chronic sinusitis   . Coronary artery disease    a. s/p CABGx 2V (2014 in [place])  b. PCI (2015 in [place]). c. PCI 2018 ([facility]). d. PCI 03/2020 (complex - [facility]).  . Diabetic peripheral neuropathy (HCC)   . DVT (deep venous thrombosis) (HCC)    "left groin; it was there when I had bypass OR; get it checked q yr; still there now" (06/08/2016)  . GERD (gastroesophageal reflux disease)   . Hepatitis B   . History of bleeding ulcers   . History of diverticulitis   . History of hiatal hernia   . HLD (hyperlipidemia)   . HTN (hypertension)   . 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 06/09/2016  . NSTEMI (non-ST elevated myocardial infarction) (HCC) 06/08/2016  . Pneumonia    "3-4 times maybe" (06/08/2016)  . PVD (peripheral vascular disease) (HCC)   . Recovering alcoholic (HCC)    "picked up my 30 year chip the other day" (06/08/2016)  . S/P angioplasty with stent, 06/08/16 DES, for in-stent restenosis in RCA. 06/09/2016  . Sleep apnea    "got a mask after OR; don't use it" (06/08/2016)  . Type II diabetes mellitus (HCC)      Medications reviewed.   Current Outpatient Medications:  .  acetaminophen (TYLENOL) 325 MG tablet, Take 2 tablets (650 mg total) by mouth every 4 (four) hours as needed for headache or mild pain., Disp: , Rfl:  .  ALPRAZolam (XANAX) 0.5 MG tablet, Take 0.25 mg by mouth 2 (two) times daily as needed for sleep. , Disp: , Rfl:  .  aspirin EC 81 MG tablet, Take 81 mg by mouth daily., Disp: , Rfl:  .  cilostazol  (PLETAL) 100 MG tablet, Take 100 mg by mouth 2 (two) times daily. , Disp: , Rfl:  .  citalopram (CELEXA) 40 MG tablet, Take 20 mg by mouth daily. , Disp: , Rfl:  .  Cyanocobalamin (VITAMIN B12 PO), Take 2,500 mcg by mouth daily., Disp: , Rfl:  .  furosemide (LASIX) 20 MG tablet, Take 20 mg by mouth as needed (leg swelling)., Disp: , Rfl:  .  glipiZIDE (GLUCOTROL) 5 MG tablet, Take 5 mg by mouth daily. , Disp: , Rfl:  .  levothyroxine (SYNTHROID, LEVOTHROID) 50 MCG tablet, Take 50 mcg by mouth daily before breakfast., Disp: , Rfl:  .  metFORMIN (GLUCOPHAGE-XR) 750 MG 24 hr tablet, Take 750 mg by mouth 2 (two) times daily., Disp: , Rfl:  .  metoprolol (LOPRESSOR) 50 MG tablet, Take 25 mg by mouth 2 (two) times daily. , Disp: , Rfl:  .  Multiple Vitamins-Minerals (PRESERVISION AREDS PO), Take 1 tablet by mouth daily., Disp: , Rfl:  .  nitroGLYCERIN (NITROSTAT) 0.4 MG SL tablet, Place 0.4 mg under the tongue every 5 (five) minutes as needed for chest pain., Disp: , Rfl:  .  polycarbophil (FIBERCON) 625 MG tablet, Take 625 mg by mouth daily., Disp: , Rfl:  .  rosuvastatin (CRESTOR) 40 MG tablet, Take 1 tablet (40 mg total) by mouth daily., Disp: 30 tablet, Rfl: 11 .  ticagrelor (BRILINTA) 90 MG TABS tablet, Take 1 tablet (90 mg total) by mouth 2 (two) times daily., Disp: 60 tablet, Rfl: 11 .  VITAMIN D PO, Take 1 tablet by mouth daily., Disp: , Rfl:    Ht Readings from Last 1 Encounters:  05/22/20 5' 6.25" (1.683 m)     Wt Readings from Last 3 Encounters:  05/22/20 179 lb 0.2 oz (81.2 kg)  04/04/20 177 lb 7.5 oz (80.5 kg)  06/09/16 189 lb (85.7 kg)     There is no height or weight on file to calculate BMI.   Social History   Tobacco Use  Smoking Status Former Smoker  . Packs/day: 1.00  . Years: 40.00  . Pack years: 40.00  . Quit date: 07/17/2008  . Years since quitting: 11.8  Smokeless Tobacco Never Used     Lab Results  Component Value Date   CHOL 124 04/02/2020   Lab Results   Component Value Date   HDL 33 (L) 04/02/2020   Lab Results  Component Value Date   LDLCALC 74 04/02/2020   Lab Results  Component Value Date   TRIG 85 04/02/2020     Lab Results  Component Value Date   HGBA1C 7.4 (H) 06/08/2016     CBG (last 3)  Recent Labs    06/02/20 1439  GLUCAP 203*     Nutrition Note  Spoke with pt. Nutrition Plan and Nutrition Survey goals reviewed with pt. Pt is following a Heart Healthy diet. Pt wants to maintain his weight.He has reduced portions and is eating out less often.  He was having swallowing difficulties but has modified his eating and does not experience these symptoms anymore (eats slow, small bites, does not overeat)  Pt has Type 2 Diabetes. Pt checks CBG's 1 times a day. Fasting CBG's reportedly 110-136 mg/dL. Most recent A1C was 6.4. His PCP discontinued glipizide and initiated jardiance 25 mg. Pt states he is not taking metformin.   Per discussion, pt does use canned/convenience foods often. Pt does not add salt to food. Pt does not eat out frequently.  Pt had questions about supplements. Encouraged pt to choose usp verified supplements.  Reviewed fruit and veggie intake.   Pt expressed understanding of the information reviewed.   Nutrition Diagnosis ? Food-and nutrition-related knowledge deficit related to lack of exposure to information as related to diagnosis of: ? CVD ? Type 2 Diabetes  Nutrition Intervention ? Pt's individual nutrition plan reviewed with pt. ? Benefits of adopting Heart Healthy diet discussed when Picture Your Plate reviewed. ? Continue client-centered nutrition education by RD, as part of interdisciplinary care.  Goal(s) ? Pt to build a healthy plate including vegetables, fruits, whole grains, and low-fat dairy products in a heart healthy meal plan. ? Pt to maintain weight and A1C   Plan:   Will provide client-centered nutrition education as part of interdisciplinary care  Monitor and evaluate  progress toward nutrition goal with team.   Andrey Campanile, MS, RDN, LDN

## 2020-06-03 NOTE — Progress Notes (Signed)
Cardiac Individual Treatment Plan  Patient Details  Name: Luke Harvey MRN: [id number] Date of Birth: [birth year] Referring Provider:   Flowsheet Row CARDIAC REHAB PHASE II ORIENTATION from 05/22/2020 in [facility]  Referring Provider Lorelle Gibbs [facility] Armanda Magic MD Covering      Initial Encounter Date:  Flowsheet Row CARDIAC REHAB PHASE II ORIENTATION from 05/22/2020 in [facility] CARDIAC REHAB  Date 05/22/20      Visit Diagnosis: S/P DES SVG to CFX OM 04/02/20  Patient's Home Medications on Admission:  Current Outpatient Medications:  .  acetaminophen (TYLENOL) 325 MG tablet, Take 2 tablets (650 mg total) by mouth every 4 (four) hours as needed for headache or mild pain., Disp: , Rfl:  .  ALPRAZolam (XANAX) 0.5 MG tablet, Take 0.25 mg by mouth 2 (two) times daily as needed for sleep. , Disp: , Rfl:  .  aspirin EC 81 MG tablet, Take 81 mg by mouth daily., Disp: , Rfl:  .  cilostazol (PLETAL) 100 MG tablet, Take 100 mg by mouth 2 (two) times daily. , Disp: , Rfl:  .  citalopram (CELEXA) 40 MG tablet, Take 20 mg by mouth daily. , Disp: , Rfl:  .  Cyanocobalamin (VITAMIN B12 PO), Take 2,500 mcg by mouth daily., Disp: , Rfl:  .  furosemide (LASIX) 20 MG tablet, Take 20 mg by mouth as needed (leg swelling)., Disp: , Rfl:  .  glipiZIDE (GLUCOTROL) 5 MG tablet, Take 5 mg by mouth daily. , Disp: , Rfl:  .  levothyroxine (SYNTHROID, LEVOTHROID) 50 MCG tablet, Take 50 mcg by mouth daily before breakfast., Disp: , Rfl:  .  metFORMIN (GLUCOPHAGE-XR) 750 MG 24 hr tablet, Take 750 mg by mouth 2 (two) times daily., Disp: , Rfl:  .  metoprolol (LOPRESSOR) 50 MG tablet, Take 25 mg by mouth 2 (two) times daily. , Disp: , Rfl:  .  Multiple Vitamins-Minerals (PRESERVISION AREDS PO), Take 1 tablet by mouth daily., Disp: , Rfl:  .  nitroGLYCERIN (NITROSTAT) 0.4 MG SL tablet, Place 0.4 mg under the tongue every 5 (five) minutes as  needed for chest pain., Disp: , Rfl:  .  polycarbophil (FIBERCON) 625 MG tablet, Take 625 mg by mouth daily., Disp: , Rfl:  .  rosuvastatin (CRESTOR) 40 MG tablet, Take 1 tablet (40 mg total) by mouth daily., Disp: 30 tablet, Rfl: 11 .  ticagrelor (BRILINTA) 90 MG TABS tablet, Take 1 tablet (90 mg total) by mouth 2 (two) times daily., Disp: 60 tablet, Rfl: 11 .  VITAMIN D PO, Take 1 tablet by mouth daily., Disp: , Rfl:   Past Medical History: Past Medical History:  Diagnosis Date  . Age-related macular degeneration, dry, both eyes   . Anxiety   . Aortic stenosis   . Arthritis    "probably all over" (06/08/2016)  . Cardiomyopathy (HCC)    a. EF 40-45% in 2018, normalized in 03/2020.  Marland Kitchen Chronic back pain    "started in my lower back; going up my back in the last couple months" (06/08/2016)  . Chronic sinusitis   . Coronary artery disease    a. s/p CABGx 2V (2014 in [place])  b. PCI (2015 in [place]). c. PCI 2018 ([facility]). d. PCI 03/2020 (complex - [facility]).  . Diabetic peripheral neuropathy (HCC)   . DVT (deep venous thrombosis) (HCC)    "left groin; it was there when I had bypass OR; get it checked q yr; still there  now" (06/08/2016)  . GERD (gastroesophageal reflux disease)   . Hepatitis B   . History of bleeding ulcers   . History of diverticulitis   . History of hiatal hernia   . HLD (hyperlipidemia)   . HTN (hypertension)   . 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 06/09/2016  . NSTEMI (non-ST elevated myocardial infarction) (HCC) 06/08/2016  . Pneumonia    "3-4 times maybe" (06/08/2016)  . PVD (peripheral vascular disease) (HCC)   . Recovering alcoholic (HCC)    "picked up my 30 year chip the other day" (06/08/2016)  . S/P angioplasty with stent, 06/08/16 DES, for in-stent restenosis in RCA. 06/09/2016  . Sleep apnea    "got a mask after OR; don't use it" (06/08/2016)  . Type II diabetes mellitus (HCC)     Tobacco Use: Social History   Tobacco Use  Smoking Status Former Smoker   . Packs/day: 1.00  . Years: 40.00  . Pack years: 40.00  . Quit date: 07/17/2008  . Years since quitting: 11.8  Smokeless Tobacco Never Used    Labs: Recent Review Advice worker    Labs for ITP Cardiac and Pulmonary Rehab Latest Ref Rng & Units 06/08/2016 06/09/2016 04/02/2020   Cholestrol 0 - 200 mg/dL - 625 638   LDLCALC 0 - 99 mg/dL - 61 74   HDL >93 mg/dL - 73(S) 28(J)   Trlycerides <150 mg/dL - 681 85   Hemoglobin L5B 4.8 - 5.6 % 7.4(H) - -      Capillary Blood Glucose: Lab Results  Component Value Date   GLUCAP 203 (H) 06/02/2020   GLUCAP 147 (H) 05/30/2020   GLUCAP 223 (H) 05/30/2020   GLUCAP 130 (H) 05/28/2020   GLUCAP 82 05/28/2020     Exercise Target Goals: Exercise Program Goal: Individual exercise prescription set using results from initial 6 min walk test and THRR while considering  patient's activity barriers and safety.   Exercise Prescription Goal: Starting with aerobic activity 30 plus minutes a day, 3 days per week for initial exercise prescription. Provide home exercise prescription and guidelines that participant acknowledges understanding prior to discharge.  Activity Barriers & Risk Stratification:  Activity Barriers & Cardiac Risk Stratification - 05/22/20 1214      Activity Barriers & Cardiac Risk Stratification   Activity Barriers Left Hip Replacement;Right Hip Replacement;Deconditioning;Shortness of Breath           6 Minute Walk:  6 Minute Walk    Row Name 05/22/20 1213         6 Minute Walk   Phase Initial     Distance 837 feet     Walk Time 6 minutes     # of Rest Breaks 0     MPH 1.58     METS 1.98     RPE 15     VO2 Peak 6.92     Symptoms Yes (comment)     Comments Right and Left Hip pain and shortness of breath     Resting HR 78 bpm     Resting BP 138/68     Resting Oxygen Saturation  98 %     Exercise Oxygen Saturation  during 6 min walk 98 %     Max Ex. HR 99 bpm     Max Ex. BP 172/66     2 Minute Post BP 150/68   Re-check 122/70            Oxygen Initial Assessment:   Oxygen Re-Evaluation:  Oxygen Discharge (Final Oxygen Re-Evaluation):   Initial Exercise Prescription:  Initial Exercise Prescription - 05/22/20 1300      Date of Initial Exercise RX and Referring Provider   Date 05/22/20    Referring Provider Lorelle Gibbs [facility] Armanda Magic MD Covering    Expected Discharge Date 07/18/20      NuStep   Level 1    SPM 80    Minutes 30    METs 1.7      Prescription Details   Frequency (times per week) 3    Duration Progress to 30 minutes of continuous aerobic without signs/symptoms of physical distress      Intensity   THRR 40-80% of Max Heartrate 58-115    Ratings of Perceived Exertion 11-15    Perceived Dyspnea 0-4      Progression   Progression Continue progressive overload as per policy without signs/symptoms or physical distress.      Resistance Training   Training Prescription Yes    Weight 2    Reps 10-15           Perform Capillary Blood Glucose checks as needed.  Exercise Prescription Changes:  Exercise Prescription Changes    Row Name 05/30/20 1342             Response to Exercise   Blood Pressure (Admit) 124/62       Blood Pressure (Exercise) 134/62       Blood Pressure (Exit) 116/60       Heart Rate (Admit) 100 bpm       Heart Rate (Exercise) 107 bpm       Heart Rate (Exit) 100 bpm       Rating of Perceived Exertion (Exercise) 11       Symptoms none       Comments Off to a good start with exercise.       Duration Continue with 30 min of aerobic exercise without signs/symptoms of physical distress.       Intensity THRR unchanged               Progression   Progression Continue to progress workloads to maintain intensity without signs/symptoms of physical distress.       Average METs 1.8               Resistance Training   Training Prescription Yes       Weight 2       Reps 10-15       Time 10 Minutes                Interval Training   Interval Training No               NuStep   Level 1       SPM 80       Minutes 25       METs 1.8              Exercise Comments:  Exercise Comments    Row Name 05/30/20 1426           Exercise Comments Patient able to tolerate low intensity exercise without symptoms.              Exercise Goals and Review:  Exercise Goals    Row Name 05/22/20 1215             Exercise Goals   Increase Physical Activity Yes       Intervention Provide advice,  education, support and counseling about physical activity/exercise needs.;Develop an individualized exercise prescription for aerobic and resistive training based on initial evaluation findings, risk stratification, comorbidities and participant's personal goals.       Expected Outcomes Short Term: Attend rehab on a regular basis to increase amount of physical activity.;Long Term: Add in home exercise to make exercise part of routine and to increase amount of physical activity.;Long Term: Exercising regularly at least 3-5 days a week.       Increase Strength and Stamina Yes       Intervention Provide advice, education, support and counseling about physical activity/exercise needs.;Develop an individualized exercise prescription for aerobic and resistive training based on initial evaluation findings, risk stratification, comorbidities and participant's personal goals.       Expected Outcomes Short Term: Increase workloads from initial exercise prescription for resistance, speed, and METs.;Short Term: Perform resistance training exercises routinely during rehab and add in resistance training at home;Long Term: Improve cardiorespiratory fitness, muscular endurance and strength as measured by increased METs and functional capacity ( )       Able to understand and use rate of perceived exertion (RPE) scale Yes       Intervention Provide education and explanation on how to use RPE scale       Expected Outcomes Short  Term: Able to use RPE daily in rehab to express subjective intensity level;Long Term:  Able to use RPE to guide intensity level when exercising independently       Able to understand and use Dyspnea scale Yes       Intervention Provide education and explanation on how to use Dyspnea scale       Expected Outcomes Short Term: Able to use Dyspnea scale daily in rehab to express subjective sense of shortness of breath during exertion;Long Term: Able to use Dyspnea scale to guide intensity level when exercising independently       Knowledge and understanding of Target Heart Rate Range (THRR) Yes       Intervention Provide education and explanation of THRR including how the numbers were predicted and where they are located for reference       Expected Outcomes Short Term: Able to state/look up THRR;Long Term: Able to use THRR to govern intensity when exercising independently;Short Term: Able to use daily as guideline for intensity in rehab       Understanding of Exercise Prescription Yes       Intervention Provide education, explanation, and written materials on patient's individual exercise prescription       Expected Outcomes Short Term: Able to explain program exercise prescription;Long Term: Able to explain home exercise prescription to exercise independently              Exercise Goals Re-Evaluation :  Exercise Goals Re-Evaluation    Row Name 05/30/20 1426             Exercise Goal Re-Evaluation   Exercise Goals Review Increase Physical Activity;Able to understand and use rate of perceived exertion (RPE) scale;Increase Strength and Stamina       Comments Patient able to understand and use RPE scale appropriately.       Expected Outcomes Progress workloads as tolerated to help increase strength and stamina.               Discharge Exercise Prescription (Final Exercise Prescription Changes):  Exercise Prescription Changes - 05/30/20 1342      Response to Exercise   Blood Pressure  (Admit) 124/62  Blood Pressure (Exercise) 134/62    Blood Pressure (Exit) 116/60    Heart Rate (Admit) 100 bpm    Heart Rate (Exercise) 107 bpm    Heart Rate (Exit) 100 bpm    Rating of Perceived Exertion (Exercise) 11    Symptoms none    Comments Off to a good start with exercise.    Duration Continue with 30 min of aerobic exercise without signs/symptoms of physical distress.    Intensity THRR unchanged      Progression   Progression Continue to progress workloads to maintain intensity without signs/symptoms of physical distress.    Average METs 1.8      Resistance Training   Training Prescription Yes    Weight 2    Reps 10-15    Time 10 Minutes      Interval Training   Interval Training No      NuStep   Level 1    SPM 80    Minutes 25    METs 1.8           Nutrition:  Target Goals: Understanding of nutrition guidelines, daily intake of sodium 1500mg , cholesterol 200mg , calories 30% from fat and 7% or less from saturated fats, daily to have 5 or more servings of fruits and vegetables.  Biometrics:  Pre Biometrics - 05/22/20 1216      Pre Biometrics   Height 5' 6.25" (1.683 m)    Weight 81.2 kg    Waist Circumference 21 inches    Hip Circumference 43 inches    Waist to Hip Ratio 0.49 %    BMI (Calculated) 28.67    Triceps Skinfold 9 mm    % Body Fat 17.2 %    Grip Strength 34 kg    Flexibility 13 in    Single Leg Stand 30 seconds            Nutrition Therapy Plan and Nutrition Goals:  Nutrition Therapy & Goals - 06/02/20 1530      Nutrition Therapy   Diet TLC    Drug/Food Interactions Statins/Certain Fruits      Personal Nutrition Goals   Nutrition Goal Pt to build a healthy plate including vegetables, fruits, whole grains, and low-fat dairy products in a heart healthy meal plan.    Personal Goal #2 Pt to maintain weight and A1C      Intervention Plan   Intervention Prescribe, educate and counsel regarding individualized specific dietary  modifications aiming towards targeted core components such as weight, hypertension, lipid management, diabetes, heart failure and other comorbidities.;Nutrition handout(s) given to patient.    Expected Outcomes Short Term Goal: Understand basic principles of dietary content, such as calories, fat, sodium, cholesterol and nutrients.           Nutrition Assessments:  MEDIFICTS Score Key:  ?70 Need to make dietary changes   40-70 Heart Healthy Diet  ? 40 Therapeutic Level Cholesterol Diet  Flowsheet Row CARDIAC REHAB PHASE II EXERCISE from 06/02/2020 in [facility] CARDIAC REHAB  Picture Your Plate Total Score on Admission 58     Picture Your Plate Scores:  <24 Unhealthy dietary pattern with much room for improvement.  41-50 Dietary pattern unlikely to meet recommendations for good health and room for improvement.  51-60 More healthful dietary pattern, with some room for improvement.   >60 Healthy dietary pattern, although there may be some specific behaviors that could be improved.    Nutrition Goals Re-Evaluation:  Nutrition Goals Re-Evaluation  Row Name 06/02/20 1531             Goals   Current Weight 179 lb (81.2 kg)       Nutrition Goal Pt to build a healthy plate including vegetables, fruits, whole grains, and low-fat dairy products in a heart healthy meal plan.               Personal Goal #2 Re-Evaluation   Personal Goal #2 Pt to maintain weight and A1C              Nutrition Goals Discharge (Final Nutrition Goals Re-Evaluation):  Nutrition Goals Re-Evaluation - 06/02/20 1531      Goals   Current Weight 179 lb (81.2 kg)    Nutrition Goal Pt to build a healthy plate including vegetables, fruits, whole grains, and low-fat dairy products in a heart healthy meal plan.      Personal Goal #2 Re-Evaluation   Personal Goal #2 Pt to maintain weight and A1C           Psychosocial: Target Goals: Acknowledge presence or absence of  significant depression and/or stress, maximize coping skills, provide positive support system. Participant is able to verbalize types and ability to use techniques and skills needed for reducing stress and depression.  Initial Review & Psychosocial Screening:  Initial Psych Review & Screening - 05/22/20 1149      Initial Review   Current issues with History of Depression      Family Dynamics   Good Support System? Yes   Crandall lives alone. Edon has his girl friend and two children for support   Comments Kelyn says he has taken an antidepressant for 5 years and he is not sure why      Screening Interventions   Interventions Encouraged to exercise;Provide feedback about the scores to participant    Expected Outcomes Long Term Goal: Stressors or current issues are controlled or eliminated.;Short Term goal: Identification and review with participant of any Quality of Life or Depression concerns found by scoring the questionnaire.;Long Term goal: The participant improves quality of Life and PHQ9 Scores as seen by post scores and/or verbalization of changes           Quality of Life Scores:  Quality of Life - 05/22/20 1217      Quality of Life   Select Quality of Life      Quality of Life Scores   Health/Function Pre 21.5 %    Socioeconomic Pre 22.93 %    Psych/Spiritual Pre 23.57 %    Family Pre 21.3 %    GLOBAL Pre 22.21 %          Scores of 19 and below usually indicate a poorer quality of life in these areas.  A difference of  2-3 points is a clinically meaningful difference.  A difference of 2-3 points in the total score of the Quality of Life Index has been associated with significant improvement in overall quality of life, self-image, physical symptoms, and general health in studies assessing change in quality of life.  PHQ-9: Recent Review Flowsheet Data    Depression screen [facility] 2/9 05/22/2020 11/01/2012 07/17/2012   Decreased Interest 1 2 0   Down, Depressed, Hopeless 0 0 0    PHQ - 2 Score 1 2 0   Altered sleeping - 0 -   Tired, decreased energy - 3 -   Change in appetite - 0 -   Feeling bad or failure about yourself  -  0 -   Trouble concentrating - 0 -   Moving slowly or fidgety/restless - 0 -   Suicidal thoughts - 0 -   PHQ-9 Score - 5 -     Interpretation of Total Score  Total Score Depression Severity:  1-4 = Minimal depression, 5-9 = Mild depression, 10-14 = Moderate depression, 15-19 = Moderately severe depression, 20-27 = Severe depression   Psychosocial Evaluation and Intervention:   Psychosocial Re-Evaluation:  Psychosocial Re-Evaluation    Row Name 05/30/20 1603             Psychosocial Re-Evaluation   Current issues with History of Depression       Comments Peyton NajjarLarry started exercise on 05/30/20 and has not voiced any increased stressors       Expected Outcomes Will continue to monitor and provide support as needed       Interventions Stress management education;Encouraged to attend Cardiac Rehabilitation for the exercise       Continue Psychosocial Services  No Follow up required              Psychosocial Discharge (Final Psychosocial Re-Evaluation):  Psychosocial Re-Evaluation - 05/30/20 1603      Psychosocial Re-Evaluation   Current issues with History of Depression    Comments Peyton NajjarLarry started exercise on 05/30/20 and has not voiced any increased stressors    Expected Outcomes Will continue to monitor and provide support as needed    Interventions Stress management education;Encouraged to attend Cardiac Rehabilitation for the exercise    Continue Psychosocial Services  No Follow up required           Vocational Rehabilitation: Provide vocational rehab assistance to qualifying candidates.   Vocational Rehab Evaluation & Intervention:  Vocational Rehab - 05/22/20 1211      Initial Vocational Rehab Evaluation & Intervention   Assessment shows need for Vocational Rehabilitation No   Peyton NajjarLarry is retired and does not need  vocational rehab at this time          Education: Education Goals: Education classes will be provided on a weekly basis, covering required topics. Participant will state understanding/return demonstration of topics presented.  Learning Barriers/Preferences:  Learning Barriers/Preferences - 05/22/20 1210      Learning Barriers/Preferences   Learning Barriers Hearing;Sight   wears glasses, very hard of hearing   Learning Preferences Pictoral;Video           Education Topics: Hypertension, Hypertension Reduction -Define heart disease and high blood pressure. Discus how high blood pressure affects the body and ways to reduce high blood pressure.   Exercise and Your Heart -Discuss why it is important to exercise, the FITT principles of exercise, normal and abnormal responses to exercise, and how to exercise safely.   Angina -Discuss definition of angina, causes of angina, treatment of angina, and how to decrease risk of having angina.   Cardiac Medications -Review what the following cardiac medications are used for, how they affect the body, and side effects that may occur when taking the medications.  Medications include Aspirin, Beta blockers, calcium channel blockers, ACE Inhibitors, angiotensin receptor blockers, diuretics, digoxin, and antihyperlipidemics.   Congestive Heart Failure -Discuss the definition of CHF, how to live with CHF, the signs and symptoms of CHF, and how keep track of weight and sodium intake.   Heart Disease and Intimacy -Discus the effect sexual activity has on the heart, how changes occur during intimacy as we age, and safety during sexual activity.   Smoking Cessation / COPD -  Discuss different methods to quit smoking, the health benefits of quitting smoking, and the definition of COPD.   Nutrition I: Fats -Discuss the types of cholesterol, what cholesterol does to the heart, and how cholesterol levels can be controlled.   Nutrition II:  Labels -Discuss the different components of food labels and how to read food label   Heart Parts/Heart Disease and PAD -Discuss the anatomy of the heart, the pathway of blood circulation through the heart, and these are affected by heart disease.   Stress I: Signs and Symptoms -Discuss the causes of stress, how stress may lead to anxiety and depression, and ways to limit stress.   Stress II: Relaxation -Discuss different types of relaxation techniques to limit stress.   Warning Signs of Stroke / TIA -Discuss definition of a stroke, what the signs and symptoms are of a stroke, and how to identify when someone is having stroke.   Knowledge Questionnaire Score:  Knowledge Questionnaire Score - 05/22/20 1214      Knowledge Questionnaire Score   Pre Score 20/24           Core Components/Risk Factors/Patient Goals at Admission:  Personal Goals and Risk Factors at Admission - 05/22/20 1212      Core Components/Risk Factors/Patient Goals on Admission    Weight Management Yes;Weight Maintenance;Weight Gain    Intervention Weight Management/Obesity: Establish reasonable short term and long term weight goals.;Weight Management: Provide education and appropriate resources to help participant work on and attain dietary goals.;Weight Management: Develop a combined nutrition and exercise program designed to reach desired caloric intake, while maintaining appropriate intake of nutrient and fiber, sodium and fats, and appropriate energy expenditure required for the weight goal.    Expected Outcomes Short Term: Continue to assess and modify interventions until short term weight is achieved;Long Term: Adherence to nutrition and physical activity/exercise program aimed toward attainment of established weight goal;Weight Loss: Understanding of general recommendations for a balanced deficit meal plan, which promotes 1-2 lb weight loss per week and includes a negative energy balance of [phone]  kcal/d;Understanding recommendations for meals to include 15-35% energy as protein, 25-35% energy from fat, 35-60% energy from carbohydrates, less than 200mg  of dietary cholesterol, 20-35 gm of total fiber daily;Understanding of distribution of calorie intake throughout the day with the consumption of 4-5 meals/snacks    Diabetes Yes    Intervention Provide education about signs/symptoms and action to take for hypo/hyperglycemia.;Provide education about proper nutrition, including hydration, and aerobic/resistive exercise prescription along with prescribed medications to achieve blood glucose in normal ranges: Fasting glucose 65-99 mg/dL    Expected Outcomes Short Term: Participant verbalizes understanding of the signs/symptoms and immediate care of hyper/hypoglycemia, proper foot care and importance of medication, aerobic/resistive exercise and nutrition plan for blood glucose control.;Long Term: Attainment of HbA1C < 7%.    Hypertension Yes    Intervention Provide education on lifestyle modifcations including regular physical activity/exercise, weight management, moderate sodium restriction and increased consumption of fresh fruit, vegetables, and low fat dairy, alcohol moderation, and smoking cessation.;Monitor prescription use compliance.    Expected Outcomes Short Term: Continued assessment and intervention until BP is < 140/55mm HG in hypertensive participants. < 130/25mm HG in hypertensive participants with diabetes, heart failure or chronic kidney disease.;Long Term: Maintenance of blood pressure at goal levels.    Lipids Yes    Intervention Provide education and support for participant on nutrition & aerobic/resistive exercise along with prescribed medications to achieve LDL 70mg , HDL >40mg .    Expected  Outcomes Short Term: Participant states understanding of desired cholesterol values and is compliant with medications prescribed. Participant is following exercise prescription and nutrition  guidelines.;Long Term: Cholesterol controlled with medications as prescribed, with individualized exercise RX and with personalized nutrition plan. Value goals: LDL < 70mg , HDL > 40 mg.           Core Components/Risk Factors/Patient Goals Review:   Goals and Risk Factor Review    Row Name 05/30/20 1606             Core Components/Risk Factors/Patient Goals Review   Personal Goals Review Weight Management/Obesity;Hypertension;Lipids;Diabetes       Review Ricco started exercise on 05/30/20 and did fair with exercise. Emmert's vital signs and CBG's were stable.       Expected Outcomes Belal will continue to participate in phase 2 cardiac rehab for exercise nutrtion and lifstyle modifications              Core Components/Risk Factors/Patient Goals at Discharge (Final Review):   Goals and Risk Factor Review - 05/30/20 1606      Core Components/Risk Factors/Patient Goals Review   Personal Goals Review Weight Management/Obesity;Hypertension;Lipids;Diabetes    Review Jair started exercise on 05/30/20 and did fair with exercise. Jontue's vital signs and CBG's were stable.    Expected Outcomes Chadley will continue to participate in phase 2 cardiac rehab for exercise nutrtion and lifstyle modifications           ITP Comments:  ITP Comments    Row Name 05/22/20 1054 05/30/20 1559 06/03/20 1346       ITP Comments Dr 08/01/20 MD, Medical Director 30 Day ITP Review. Shmiel started exercise on 05/30/20 and did fair with exercise. 30 Day ITP Review. Yuuki continues to do fair with exercise. Kahli's CBG's have been improved since he has been holding his glipizide.            Comments: See ITP comments.Peyton Najjar, RN,BSN 06/03/2020 1:52 PM

## 2020-06-04 ENCOUNTER — Encounter (HOSPITAL_COMMUNITY)
Admission: RE | Admit: 2020-06-04 | Discharge: 2020-06-04 | Disposition: A | Discharge status: Home / Self Care | Payer: No Typology Code available for payment source | Source: Ambulatory Visit | Attending: Cardiology | Admitting: Cardiology

## 2020-06-04 ENCOUNTER — Other Ambulatory Visit: Payer: Self-pay

## 2020-06-04 DIAGNOSIS — Z955 Presence of coronary angioplasty implant and graft: Secondary | ICD-10-CM

## 2020-06-04 NOTE — Progress Notes (Signed)
Isac says that his glipizide and metformin have been discontinued. Trequan has been started on empagliflozin / jardiance 25 mg once a day for his diabetes.[name], RN,BSN 06/04/2020 1:42 PM

## 2020-06-06 ENCOUNTER — Encounter (HOSPITAL_COMMUNITY)
Admission: RE | Admit: 2020-06-06 | Discharge: 2020-06-06 | Disposition: A | Discharge status: Home / Self Care | Payer: No Typology Code available for payment source | Source: Ambulatory Visit | Attending: Cardiology | Admitting: Cardiology

## 2020-06-06 ENCOUNTER — Other Ambulatory Visit: Payer: Self-pay

## 2020-06-06 DIAGNOSIS — Z955 Presence of coronary angioplasty implant and graft: Secondary | ICD-10-CM | POA: Diagnosis not present

## 2020-06-09 ENCOUNTER — Other Ambulatory Visit: Payer: Self-pay

## 2020-06-09 ENCOUNTER — Encounter (HOSPITAL_COMMUNITY)
Admission: RE | Admit: 2020-06-09 | Discharge: 2020-06-09 | Disposition: A | Discharge status: Home / Self Care | Payer: No Typology Code available for payment source | Source: Ambulatory Visit | Attending: Cardiology | Admitting: Cardiology

## 2020-06-09 DIAGNOSIS — Z955 Presence of coronary angioplasty implant and graft: Secondary | ICD-10-CM

## 2020-06-11 ENCOUNTER — Other Ambulatory Visit: Payer: Self-pay

## 2020-06-11 ENCOUNTER — Encounter (HOSPITAL_COMMUNITY)
Admission: RE | Admit: 2020-06-11 | Discharge: 2020-06-11 | Disposition: A | Discharge status: Home / Self Care | Payer: No Typology Code available for payment source | Source: Ambulatory Visit | Attending: Cardiology | Admitting: Cardiology

## 2020-06-11 DIAGNOSIS — Z955 Presence of coronary angioplasty implant and graft: Secondary | ICD-10-CM | POA: Diagnosis not present

## 2020-06-13 ENCOUNTER — Other Ambulatory Visit: Payer: Self-pay

## 2020-06-13 ENCOUNTER — Encounter (HOSPITAL_COMMUNITY)
Admission: RE | Admit: 2020-06-13 | Discharge: 2020-06-13 | Disposition: A | Discharge status: Home / Self Care | Payer: No Typology Code available for payment source | Source: Ambulatory Visit | Attending: Cardiology | Admitting: Cardiology

## 2020-06-13 DIAGNOSIS — Z955 Presence of coronary angioplasty implant and graft: Secondary | ICD-10-CM

## 2020-06-16 ENCOUNTER — Encounter (HOSPITAL_COMMUNITY)
Admission: RE | Admit: 2020-06-16 | Discharge: 2020-06-16 | Disposition: A | Discharge status: Home / Self Care | Payer: No Typology Code available for payment source | Source: Ambulatory Visit | Attending: Cardiology | Admitting: Cardiology

## 2020-06-16 ENCOUNTER — Other Ambulatory Visit: Payer: Self-pay

## 2020-06-16 DIAGNOSIS — Z955 Presence of coronary angioplasty implant and graft: Secondary | ICD-10-CM | POA: Diagnosis not present

## 2020-06-18 ENCOUNTER — Encounter (HOSPITAL_COMMUNITY)
Admission: RE | Admit: 2020-06-18 | Discharge: 2020-06-18 | Disposition: A | Discharge status: Home / Self Care | Payer: No Typology Code available for payment source | Source: Ambulatory Visit | Attending: Cardiology | Admitting: Cardiology

## 2020-06-18 ENCOUNTER — Other Ambulatory Visit: Payer: Self-pay

## 2020-06-18 DIAGNOSIS — Z955 Presence of coronary angioplasty implant and graft: Secondary | ICD-10-CM

## 2020-06-20 ENCOUNTER — Other Ambulatory Visit: Payer: Self-pay

## 2020-06-20 ENCOUNTER — Encounter (HOSPITAL_COMMUNITY)
Admission: RE | Admit: 2020-06-20 | Discharge: 2020-06-20 | Disposition: A | Discharge status: Home / Self Care | Payer: No Typology Code available for payment source | Source: Ambulatory Visit | Attending: Cardiology | Admitting: Cardiology

## 2020-06-20 DIAGNOSIS — Z955 Presence of coronary angioplasty implant and graft: Secondary | ICD-10-CM | POA: Diagnosis not present

## 2020-06-23 ENCOUNTER — Other Ambulatory Visit: Payer: Self-pay

## 2020-06-23 ENCOUNTER — Encounter (HOSPITAL_COMMUNITY)
Admission: RE | Admit: 2020-06-23 | Discharge: 2020-06-23 | Disposition: A | Discharge status: Home / Self Care | Payer: No Typology Code available for payment source | Source: Ambulatory Visit | Attending: Cardiology | Admitting: Cardiology

## 2020-06-23 DIAGNOSIS — Z955 Presence of coronary angioplasty implant and graft: Secondary | ICD-10-CM

## 2020-06-24 NOTE — Progress Notes (Signed)
Cardiac Individual Treatment Plan  Patient Details  Name: Luke Harvey MRN: [id number] Date of Birth: 1945-08-04 Referring Provider:   Flowsheet Row CARDIAC REHAB PHASE II ORIENTATION from 05/22/2020 in [facility]  Referring Provider Lorelle Gibbs [facility] Armanda Magic MD Covering      Initial Encounter Date:  Flowsheet Row CARDIAC REHAB PHASE II ORIENTATION from 05/22/2020 in [facility] CARDIAC [facility]HAB  Date 05/22/20      Visit Diagnosis: S/P DES SVG to CFX OM 04/02/20  Patient's Home Medications on Admission:  Current Outpatient Medications:  .  acetaminophen (TYLENOL) 325 MG tablet, Take 2 tablets (650 mg total) by mouth every 4 (four) hours as needed for headache or mild pain., Disp: , Rfl:  .  ALPRAZolam (XANAX) 0.5 MG tablet, Take 0.25 mg by mouth 2 (two) times daily as needed for sleep. , Disp: , Rfl:  .  aspirin EC 81 MG tablet, Take 81 mg by mouth daily., Disp: , Rfl:  .  cilostazol (PLETAL) 100 MG tablet, Take 100 mg by mouth 2 (two) times daily. , Disp: , Rfl:  .  citalopram (CELEXA) 40 MG tablet, Take 20 mg by mouth daily. , Disp: , Rfl:  .  Cyanocobalamin (VITAMIN B12 PO), Take 2,500 mcg by mouth daily., Disp: , Rfl:  .  empagliflozin (JARDIANCE) 25 MG TABS tablet, Take 25 mg by mouth daily., Disp: , Rfl:  .  furosemide (LASIX) 20 MG tablet, Take 20 mg by mouth as needed (leg swelling)., Disp: , Rfl:  .  glipiZIDE (GLUCOTROL) 5 MG tablet, Take 5 mg by mouth daily.  (Patient not taking: Reported on 06/04/2020), Disp: , Rfl:  .  levothyroxine (SYNTHROID, LEVOTHROID) 50 MCG tablet, Take 50 mcg by mouth daily before breakfast., Disp: , Rfl:  .  metFORMIN (GLUCOPHAGE-XR) 750 MG 24 hr tablet, Take 750 mg by mouth 2 (two) times daily. (Patient not taking: Reported on 06/04/2020), Disp: , Rfl:  .  metoprolol (LOPRESSOR) 50 MG tablet, Take 25 mg by mouth 2 (two) times daily. , Disp: , Rfl:  .  Multiple Vitamins-Minerals  (PRESERVISION AREDS PO), Take 1 tablet by mouth daily., Disp: , Rfl:  .  nitroGLYCERIN (NITROSTAT) 0.4 MG SL tablet, Place 0.4 mg under the tongue every 5 (five) minutes as needed for chest pain., Disp: , Rfl:  .  polycarbophil (FIBERCON) 625 MG tablet, Take 625 mg by mouth daily., Disp: , Rfl:  .  rosuvastatin (CRESTOR) 40 MG tablet, Take 1 tablet (40 mg total) by mouth daily., Disp: 30 tablet, Rfl: 11 .  ticagrelor (BRILINTA) 90 MG TABS tablet, Take 1 tablet (90 mg total) by mouth 2 (two) times daily., Disp: 60 tablet, Rfl: 11 .  VITAMIN D PO, Take 1 tablet by mouth daily., Disp: , Rfl:   Past Medical History: Past Medical History:  Diagnosis Date  . Age-related macular degeneration, dry, both eyes   . Anxiety   . Aortic stenosis   . Arthritis    "probably all over" (06/08/2016)  . Cardiomyopathy (HCC)    a. EF 40-45% in 2018, normalized in 03/2020.  Marland Kitchen Chronic back pain    "started in my lower back; going up my back in the last couple months" (06/08/2016)  . Chronic sinusitis   . Coronary artery disease    a. s/p CABGx 2V (2014 in [place])  b. PCI (2015 in [place]). c. PCI 2018 ([facility]). d. PCI 03/2020 (complex - [facility]).  . Diabetic peripheral  neuropathy (HCC)   . DVT (deep venous thrombosis) (HCC)    "left groin; it was there when I had bypass OR; get it checked q yr; still there now" (06/08/2016)  . GERD (gastroesophageal reflux disease)   . Hepatitis B   . History of bleeding ulcers   . History of diverticulitis   . History of hiatal hernia   . HLD (hyperlipidemia)   . HTN (hypertension)   . 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 06/09/2016  . NSTEMI (non-ST elevated myocardial infarction) (HCC) 06/08/2016  . Pneumonia    "3-4 times maybe" (06/08/2016)  . PVD (peripheral vascular disease) (HCC)   . Recovering alcoholic (HCC)    "picked up my 30 year chip the other day" (06/08/2016)  . S/P angioplasty with stent, 06/08/16 DES, for in-stent restenosis in RCA. 06/09/2016  . Sleep apnea     "got a mask after OR; don't use it" (06/08/2016)  . Type II diabetes mellitus (HCC)     Tobacco Use: Social History   Tobacco Use  Smoking Status Former Smoker  . Packs/day: 1.00  . Years: 40.00  . Pack years: 40.00  . Quit date: 07/17/2008  . Years since quitting: 11.9  Smokeless Tobacco Never Used    Labs: Recent Review Advice worker    Labs for ITP Cardiac and Pulmonary Rehab Latest Ref Rng & Units 06/08/2016 06/09/2016 04/02/2020   Cholestrol 0 - 200 mg/dL - 161 096   LDLCALC 0 - 99 mg/dL - 61 74   HDL >04 mg/dL - 54(U) 98(J)   Trlycerides <150 mg/dL - 191 85   Hemoglobin Y7W 4.8 - 5.6 % 7.4(H) - -      Capillary Blood Glucose: Lab Results  Component Value Date   GLUCAP 203 (H) 06/02/2020   GLUCAP 147 (H) 05/30/2020   GLUCAP 223 (H) 05/30/2020   GLUCAP 130 (H) 05/28/2020   GLUCAP 82 05/28/2020     Exercise Target Goals: Exercise Program Goal: Individual exercise prescription set using results from initial 6 min walk test and THRR while considering  patient's activity barriers and safety.   Exercise Prescription Goal: Starting with aerobic activity 30 plus minutes a day, 3 days per week for initial exercise prescription. Provide home exercise prescription and guidelines that participant acknowledges understanding prior to discharge.  Activity Barriers & Risk Stratification:  Activity Barriers & Cardiac Risk Stratification - 05/22/20 1214      Activity Barriers & Cardiac Risk Stratification   Activity Barriers Left Hip Replacement;Right Hip Replacement;Deconditioning;Shortness of Breath           6 Minute Walk:  6 Minute Walk    Row Name 05/22/20 1213         6 Minute Walk   Phase Initial     Distance 837 feet     Walk Time 6 minutes     # of Rest Breaks 0     MPH 1.58     METS 1.98     RPE 15     VO2 Peak 6.92     Symptoms Yes (comment)     Comments Right and Left Hip pain and shortness of breath     Resting HR 78 bpm     Resting BP 138/68      Resting Oxygen Saturation  98 %     Exercise Oxygen Saturation  during 6 min walk 98 %     Max Ex. HR 99 bpm     Max Ex. BP 172/66  2 Minute Post BP 150/68  Re-check 122/70            Oxygen Initial Assessment:   Oxygen Re-Evaluation:   Oxygen Discharge (Final Oxygen Re-Evaluation):   Initial Exercise Prescription:  Initial Exercise Prescription - 05/22/20 1300      Date of Initial Exercise RX and Referring Provider   Date 05/22/20    Referring Provider [facility]Andrew [facility] [facility] Armanda Magicraci Turner MD Covering    Expected Discharge Date 07/18/20      NuStep   Level 1    SPM 80    Minutes 30    METs 1.7      Prescription Details   Frequency (times per week) 3    Duration Progress to 30 minutes of continuous aerobic without signs/symptoms of physical distress      Intensity   THRR 40-80% of Max Heartrate 58-115    Ratings of Perceived Exertion 11-15    Perceived Dyspnea 0-4      Progression   Progression Continue progressive overload as per policy without signs/symptoms or physical distress.      Resistance Training   Training Prescription Yes    Weight 2    Reps 10-15           Perform Capillary Blood Glucose checks as needed.  Exercise Prescription Changes:  Exercise Prescription Changes    Row Name 05/30/20 1342 06/20/20 1600           Response to Exercise   Blood Pressure (Admit) 124/62 112/60      Blood Pressure (Exercise) 134/62 134/50      Blood Pressure (Exit) 116/60 122/64      Heart Rate (Admit) 100 bpm 80 bpm      Heart Rate (Exercise) 107 bpm 102 bpm      Heart Rate (Exit) 100 bpm 81 bpm      Rating of Perceived Exertion (Exercise) 11 13      Symptoms none None      Comments Off to a good start with exercise. Reviewed METs      Duration Continue with 30 min of aerobic exercise without signs/symptoms of physical distress. Continue with 30 min of aerobic exercise without signs/symptoms of physical distress.      Intensity THRR  unchanged THRR unchanged             Progression   Progression Continue to progress workloads to maintain intensity without signs/symptoms of physical distress. Continue to progress workloads to maintain intensity without signs/symptoms of physical distress.      Average METs 1.8 2.7             Resistance Training   Training Prescription Yes Yes      Weight 2 4 lbs      Reps 10-15 10-15      Time 10 Minutes 10 Minutes             Interval Training   Interval Training No No             NuStep   Level 1 3      SPM 80 85      Minutes 25 30      METs 1.8 2.7             Exercise Comments:  Exercise Comments    Row Name 05/30/20 1426 06/20/20 1600         Exercise Comments Patient able to tolerate low intensity exercise without symptoms. Reviewed METS. Pt  is making good progress and increasing his exercise workloads with no complaints.             Exercise Goals and Review:  Exercise Goals    Row Name 05/22/20 1215             Exercise Goals   Increase Physical Activity Yes       Intervention Provide advice, education, support and counseling about physical activity/exercise needs.;Develop an individualized exercise prescription for aerobic and resistive training based on initial evaluation findings, risk stratification, comorbidities and participant's personal goals.       Expected Outcomes Short Term: Attend rehab on a regular basis to increase amount of physical activity.;Long Term: Add in home exercise to make exercise part of routine and to increase amount of physical activity.;Long Term: Exercising regularly at least 3-5 days a week.       Increase Strength and Stamina Yes       Intervention Provide advice, education, support and counseling about physical activity/exercise needs.;Develop an individualized exercise prescription for aerobic and resistive training based on initial evaluation findings, risk stratification, comorbidities and participant's personal  goals.       Expected Outcomes Short Term: Increase workloads from initial exercise prescription for resistance, speed, and METs.;Short Term: Perform resistance training exercises routinely during rehab and add in resistance training at home;Long Term: Improve cardiorespiratory fitness, muscular endurance and strength as measured by increased METs and functional capacity ( )       Able to understand and use rate of perceived exertion (RPE) scale Yes       Intervention Provide education and explanation on how to use RPE scale       Expected Outcomes Short Term: Able to use RPE daily in rehab to express subjective intensity level;Long Term:  Able to use RPE to guide intensity level when exercising independently       Able to understand and use Dyspnea scale Yes       Intervention Provide education and explanation on how to use Dyspnea scale       Expected Outcomes Short Term: Able to use Dyspnea scale daily in rehab to express subjective sense of shortness of breath during exertion;Long Term: Able to use Dyspnea scale to guide intensity level when exercising independently       Knowledge and understanding of Target Heart Rate Range (THRR) Yes       Intervention Provide education and explanation of THRR including how the numbers were predicted and where they are located for reference       Expected Outcomes Short Term: Able to state/look up THRR;Long Term: Able to use THRR to govern intensity when exercising independently;Short Term: Able to use daily as guideline for intensity in rehab       Understanding of Exercise Prescription Yes       Intervention Provide education, explanation, and written materials on patient's individual exercise prescription       Expected Outcomes Short Term: Able to explain program exercise prescription;Long Term: Able to explain home exercise prescription to exercise independently              Exercise Goals Re-Evaluation :  Exercise Goals Re-Evaluation    Row Name  05/30/20 1426             Exercise Goal Re-Evaluation   Exercise Goals Review Increase Physical Activity;Able to understand and use rate of perceived exertion (RPE) scale;Increase Strength and Stamina       Comments Patient able to understand  and use RPE scale appropriately.       Expected Outcomes Progress workloads as tolerated to help increase strength and stamina.               Discharge Exercise Prescription (Final Exercise Prescription Changes):  Exercise Prescription Changes - 06/20/20 1600      Response to Exercise   Blood Pressure (Admit) 112/60    Blood Pressure (Exercise) 134/50    Blood Pressure (Exit) 122/64    Heart Rate (Admit) 80 bpm    Heart Rate (Exercise) 102 bpm    Heart Rate (Exit) 81 bpm    Rating of Perceived Exertion (Exercise) 13    Symptoms None    Comments Reviewed METs    Duration Continue with 30 min of aerobic exercise without signs/symptoms of physical distress.    Intensity THRR unchanged      Progression   Progression Continue to progress workloads to maintain intensity without signs/symptoms of physical distress.    Average METs 2.7      Resistance Training   Training Prescription Yes    Weight 4 lbs    Reps 10-15    Time 10 Minutes      Interval Training   Interval Training No      NuStep   Level 3    SPM 85    Minutes 30    METs 2.7           Nutrition:  Target Goals: Understanding of nutrition guidelines, daily intake of sodium 1500mg , cholesterol 200mg , calories 30% from fat and 7% or less from saturated fats, daily to have 5 or more servings of fruits and vegetables.  Biometrics:  Pre Biometrics - 05/22/20 1216      Pre Biometrics   Height 5' 6.25" (1.683 m)    Weight 81.2 kg    Waist Circumference 21 inches    Hip Circumference 43 inches    Waist to Hip Ratio 0.49 %    BMI (Calculated) 28.67    Triceps Skinfold 9 mm    % Body Fat 17.2 %    Grip Strength 34 kg    Flexibility 13 in    Single Leg Stand 30  seconds            Nutrition Therapy Plan and Nutrition Goals:  Nutrition Therapy & Goals - 06/02/20 1530      Nutrition Therapy   Diet TLC    Drug/Food Interactions Statins/Certain Fruits      Personal Nutrition Goals   Nutrition Goal Pt to build a healthy plate including vegetables, fruits, whole grains, and low-fat dairy products in a heart healthy meal plan.    Personal Goal #2 Pt to maintain weight and A1C      Intervention Plan   Intervention Prescribe, educate and counsel regarding individualized specific dietary modifications aiming towards targeted core components such as weight, hypertension, lipid management, diabetes, heart failure and other comorbidities.;Nutrition handout(s) given to patient.    Expected Outcomes Short Term Goal: Understand basic principles of dietary content, such as calories, fat, sodium, cholesterol and nutrients.           Nutrition Assessments:  MEDIFICTS Score Key:  ?70 Need to make dietary changes   40-70 Heart Healthy Diet  ? 40 Therapeutic Level Cholesterol Diet  Flowsheet Row CARDIAC REHAB PHASE II EXERCISE from 06/02/2020 in [facility] CARDIAC REHAB  Picture Your Plate Total Score on Admission 58     Picture Your Plate Scores:  <  40 Unhealthy dietary pattern with much room for improvement.  41-50 Dietary pattern unlikely to meet recommendations for good health and room for improvement.  51-60 More healthful dietary pattern, with some room for improvement.   >60 Healthy dietary pattern, although there may be some specific behaviors that could be improved.    Nutrition Goals Re-Evaluation:  Nutrition Goals Re-Evaluation    Row Name 06/02/20 1531 06/20/20 1207           Goals   Current Weight 179 lb (81.2 kg) 177 lb 4 oz (80.4 kg)      Nutrition Goal Pt to build a healthy plate including vegetables, fruits, whole grains, and low-fat dairy products in a heart healthy meal plan. Pt to build a healthy plate  including vegetables, fruits, whole grains, and low-fat dairy products in a heart healthy meal plan.      Expected Outcome - Weight and A1C maintenance             Personal Goal #2 Re-Evaluation   Personal Goal #2 Pt to maintain weight and A1C Pt to maintain weight and A1C             Nutrition Goals Discharge (Final Nutrition Goals Re-Evaluation):  Nutrition Goals Re-Evaluation - 06/20/20 1207      Goals   Current Weight 177 lb 4 oz (80.4 kg)    Nutrition Goal Pt to build a healthy plate including vegetables, fruits, whole grains, and low-fat dairy products in a heart healthy meal plan.    Expected Outcome Weight and A1C maintenance      Personal Goal #2 Re-Evaluation   Personal Goal #2 Pt to maintain weight and A1C           Psychosocial: Target Goals: Acknowledge presence or absence of significant depression and/or stress, maximize coping skills, provide positive support system. Participant is able to verbalize types and ability to use techniques and skills needed for reducing stress and depression.  Initial Review & Psychosocial Screening:  Initial Psych Review & Screening - 05/22/20 1149      Initial Review   Current issues with History of Depression      Family Dynamics   Good Support System? Yes   Jonmarc lives alone. Nussen has his girl friend and two children for support   Comments Karston says he has taken an antidepressant for 5 years and he is not sure why      Screening Interventions   Interventions Encouraged to exercise;Provide feedback about the scores to participant    Expected Outcomes Long Term Goal: Stressors or current issues are controlled or eliminated.;Short Term goal: Identification and review with participant of any Quality of Life or Depression concerns found by scoring the questionnaire.;Long Term goal: The participant improves quality of Life and PHQ9 Scores as seen by post scores and/or verbalization of changes           Quality of Life  Scores:  Quality of Life - 05/22/20 1217      Quality of Life   Select Quality of Life      Quality of Life Scores   Health/Function Pre 21.5 %    Socioeconomic Pre 22.93 %    Psych/Spiritual Pre 23.57 %    Family Pre 21.3 %    GLOBAL Pre 22.21 %          Scores of 19 and below usually indicate a poorer quality of life in these areas.  A difference of  2-3 points is a  clinically meaningful difference.  A difference of 2-3 points in the total score of the Quality of Life Index has been associated with significant improvement in overall quality of life, self-image, physical symptoms, and general health in studies assessing change in quality of life.  PHQ-9: Recent Review Flowsheet Data    Depression screen [facility] 2/9 05/22/2020 11/01/2012 07/17/2012   Decreased Interest 1 2 0   Down, Depressed, Hopeless 0 0 0   PHQ - 2 Score 1 2 0   Altered sleeping - 0 -   Tired, decreased energy - 3 -   Change in appetite - 0 -   Feeling bad or failure about yourself  - 0 -   Trouble concentrating - 0 -   Moving slowly or fidgety/restless - 0 -   Suicidal thoughts - 0 -   PHQ-9 Score - 5 -     Interpretation of Total Score  Total Score Depression Severity:  1-4 = Minimal depression, 5-9 = Mild depression, 10-14 = Moderate depression, 15-19 = Moderately severe depression, 20-27 = Severe depression   Psychosocial Evaluation and Intervention:   Psychosocial Re-Evaluation:  Psychosocial Re-Evaluation    Row Name 05/30/20 1603 06/24/20 1723           Psychosocial Re-Evaluation   Current issues with History of Depression History of Depression      Comments Tuan started exercise on 05/30/20 and has not voiced any increased stressors Destyn has not voiced any increased conerns or stressors      Expected Outcomes Will continue to monitor and provide support as needed Will continue to monitor and provide support as needed      Interventions Stress management education;Encouraged to attend Cardiac  Rehabilitation for the exercise Stress management education;Encouraged to attend Cardiac Rehabilitation for the exercise      Continue Psychosocial Services  No Follow up required No Follow up required             Psychosocial Discharge (Final Psychosocial Re-Evaluation):  Psychosocial Re-Evaluation - 06/24/20 1723      Psychosocial Re-Evaluation   Current issues with History of Depression    Comments Morrie has not voiced any increased conerns or stressors    Expected Outcomes Will continue to monitor and provide support as needed    Interventions Stress management education;Encouraged to attend Cardiac Rehabilitation for the exercise    Continue Psychosocial Services  No Follow up required           Vocational Rehabilitation: Provide vocational rehab assistance to qualifying candidates.   Vocational Rehab Evaluation & Intervention:  Vocational Rehab - 05/22/20 1211      Initial Vocational Rehab Evaluation & Intervention   Assessment shows need for Vocational Rehabilitation No   Wheeler is retired and does not need vocational rehab at this time          Education: Education Goals: Education classes will be provided on a weekly basis, covering required topics. Participant will state understanding/return demonstration of topics presented.  Learning Barriers/Preferences:  Learning Barriers/Preferences - 05/22/20 1210      Learning Barriers/Preferences   Learning Barriers Hearing;Sight   wears glasses, very hard of hearing   Learning Preferences Pictoral;Video           Education Topics: Hypertension, Hypertension Reduction -Define heart disease and high blood pressure. Discus how high blood pressure affects the body and ways to reduce high blood pressure.   Exercise and Your Heart -Discuss why it is important to exercise, the FITT principles  of exercise, normal and abnormal responses to exercise, and how to exercise safely.   Angina -Discuss definition of angina,  causes of angina, treatment of angina, and how to decrease risk of having angina.   Cardiac Medications -Review what the following cardiac medications are used for, how they affect the body, and side effects that may occur when taking the medications.  Medications include Aspirin, Beta blockers, calcium channel blockers, ACE Inhibitors, angiotensin receptor blockers, diuretics, digoxin, and antihyperlipidemics.   Congestive Heart Failure -Discuss the definition of CHF, how to live with CHF, the signs and symptoms of CHF, and how keep track of weight and sodium intake.   Heart Disease and Intimacy -Discus the effect sexual activity has on the heart, how changes occur during intimacy as we age, and safety during sexual activity.   Smoking Cessation / COPD -Discuss different methods to quit smoking, the health benefits of quitting smoking, and the definition of COPD.   Nutrition I: Fats -Discuss the types of cholesterol, what cholesterol does to the heart, and how cholesterol levels can be controlled.   Nutrition II: Labels -Discuss the different components of food labels and how to read food label   Heart Parts/Heart Disease and PAD -Discuss the anatomy of the heart, the pathway of blood circulation through the heart, and these are affected by heart disease.   Stress I: Signs and Symptoms -Discuss the causes of stress, how stress may lead to anxiety and depression, and ways to limit stress.   Stress II: Relaxation -Discuss different types of relaxation techniques to limit stress.   Warning Signs of Stroke / TIA -Discuss definition of a stroke, what the signs and symptoms are of a stroke, and how to identify when someone is having stroke.   Knowledge Questionnaire Score:  Knowledge Questionnaire Score - 05/22/20 1214      Knowledge Questionnaire Score   Pre Score 20/24           Core Components/Risk Factors/Patient Goals at Admission:  Personal Goals and Risk  Factors at Admission - 05/22/20 1212      Core Components/Risk Factors/Patient Goals on Admission    Weight Management Yes;Weight Maintenance;Weight Gain    Intervention Weight Management/Obesity: Establish reasonable short term and long term weight goals.;Weight Management: Provide education and appropriate resources to help participant work on and attain dietary goals.;Weight Management: Develop a combined nutrition and exercise program designed to reach desired caloric intake, while maintaining appropriate intake of nutrient and fiber, sodium and fats, and appropriate energy expenditure required for the weight goal.    Expected Outcomes Short Term: Continue to assess and modify interventions until short term weight is achieved;Long Term: Adherence to nutrition and physical activity/exercise program aimed toward attainment of established weight goal;Weight Loss: Understanding of general recommendations for a balanced deficit meal plan, which promotes 1-2 lb weight loss per week and includes a negative energy balance of [phone] kcal/d;Understanding recommendations for meals to include 15-35% energy as protein, 25-35% energy from fat, 35-60% energy from carbohydrates, less than  of dietary cholesterol, 20-35 gm of total fiber daily;Understanding of distribution of calorie intake throughout the day with the consumption of 4-5 meals/snacks    Diabetes Yes    Intervention Provide education about signs/symptoms and action to take for hypo/hyperglycemia.;Provide education about proper nutrition, including hydration, and aerobic/resistive exercise prescription along with prescribed medications to achieve blood glucose in normal ranges: Fasting glucose 65-99 mg/dL    Expected Outcomes Short Term: Participant verbalizes understanding of the signs/symptoms and  immediate care of hyper/hypoglycemia, proper foot care and importance of medication, aerobic/resistive exercise and nutrition plan for blood glucose  control.;Long Term: Attainment of HbA1C < 7%.    Hypertension Yes    Intervention Provide education on lifestyle modifcations including regular physical activity/exercise, weight management, moderate sodium restriction and increased consumption of fresh fruit, vegetables, and low fat dairy, alcohol moderation, and smoking cessation.;Monitor prescription use compliance.    Expected Outcomes Short Term: Continued assessment and intervention until BP is < 140/42mm HG in hypertensive participants. < 130/45mm HG in hypertensive participants with diabetes, heart failure or chronic kidney disease.;Long Term: Maintenance of blood pressure at goal levels.    Lipids Yes    Intervention Provide education and support for participant on nutrition & aerobic/resistive exercise along with prescribed medications to achieve LDL 70mg , HDL >40mg .    Expected Outcomes Short Term: Participant states understanding of desired cholesterol values and is compliant with medications prescribed. Participant is following exercise prescription and nutrition guidelines.;Long Term: Cholesterol controlled with medications as prescribed, with individualized exercise RX and with personalized nutrition plan. Value goals: LDL < , HDL > 40 mg.           Core Components/Risk Factors/Patient Goals Review:   Goals and Risk Factor Review    Row Name 05/30/20 1606 06/24/20 1723           Core Components/Risk Factors/Patient Goals Review   Personal Goals Review Weight Management/Obesity;Hypertension;Lipids;Diabetes Weight Management/Obesity;Hypertension;Lipids;Diabetes      Review Matisse started exercise on 05/30/20 and did fair with exercise. Christ's vital signs and CBG's were stable. Kowen has been doing well with exercise.Vital signs and CBG's have been stable      Expected Outcomes Quanell will continue to participate in phase 2 cardiac rehab for exercise nutrtion and lifstyle modifications Alvester will continue to participate in phase 2  cardiac rehab for exercise nutrtion and lifstyle modifications             Core Components/Risk Factors/Patient Goals at Discharge (Final Review):   Goals and Risk Factor Review - 06/24/20 1723      Core Components/Risk Factors/Patient Goals Review   Personal Goals Review Weight Management/Obesity;Hypertension;Lipids;Diabetes    Review Charly has been doing well with exercise.Vital signs and CBG's have been stable    Expected Outcomes Jermarion will continue to participate in phase 2 cardiac rehab for exercise nutrtion and lifstyle modifications           ITP Comments:  ITP Comments    Row Name 05/22/20 1054 05/30/20 1559 06/03/20 1346 06/24/20 1722     ITP Comments Dr Armanda Magic MD, Medical Director 30 Day ITP Review. Le started exercise on 05/30/20 and did fair with exercise. 30 Day ITP Review. Elizeo continues to do fair with exercise. Maddax's CBG's have been improved since he has been holding his glipizide. 30 Day ITP Review. Eddrick has good attendance and partcipation in phase 2 cardiac rehab           Comments: See ITP comments.[name], RN,BSN 06/24/2020 5:26 PM

## 2020-06-25 ENCOUNTER — Other Ambulatory Visit: Payer: Self-pay

## 2020-06-25 ENCOUNTER — Encounter (HOSPITAL_COMMUNITY)
Admission: RE | Admit: 2020-06-25 | Discharge: 2020-06-25 | Disposition: A | Discharge status: Home / Self Care | Payer: No Typology Code available for payment source | Source: Ambulatory Visit | Attending: Cardiology | Admitting: Cardiology

## 2020-06-25 DIAGNOSIS — I2510 Atherosclerotic heart disease of native coronary artery without angina pectoris: Secondary | ICD-10-CM | POA: Diagnosis present

## 2020-06-25 DIAGNOSIS — Z955 Presence of coronary angioplasty implant and graft: Secondary | ICD-10-CM | POA: Diagnosis not present

## 2020-06-27 ENCOUNTER — Encounter (HOSPITAL_COMMUNITY)
Admission: RE | Admit: 2020-06-27 | Discharge: 2020-06-27 | Disposition: A | Discharge status: Home / Self Care | Payer: No Typology Code available for payment source | Source: Ambulatory Visit | Attending: Cardiology | Admitting: Cardiology

## 2020-06-27 ENCOUNTER — Other Ambulatory Visit: Payer: Self-pay

## 2020-06-27 DIAGNOSIS — I2510 Atherosclerotic heart disease of native coronary artery without angina pectoris: Secondary | ICD-10-CM | POA: Diagnosis not present

## 2020-06-27 DIAGNOSIS — Z955 Presence of coronary angioplasty implant and graft: Secondary | ICD-10-CM

## 2020-06-30 ENCOUNTER — Other Ambulatory Visit: Payer: Self-pay

## 2020-06-30 ENCOUNTER — Encounter (HOSPITAL_COMMUNITY)
Admission: RE | Admit: 2020-06-30 | Discharge: 2020-06-30 | Disposition: A | Discharge status: Home / Self Care | Payer: No Typology Code available for payment source | Source: Ambulatory Visit | Attending: Cardiology | Admitting: Cardiology

## 2020-06-30 DIAGNOSIS — Z955 Presence of coronary angioplasty implant and graft: Secondary | ICD-10-CM

## 2020-06-30 DIAGNOSIS — I2510 Atherosclerotic heart disease of native coronary artery without angina pectoris: Secondary | ICD-10-CM | POA: Diagnosis not present

## 2020-07-01 NOTE — Progress Notes (Signed)
Reviewed home exercise Rx with patient today. Pt is not currently doing any additional exercise at home but was encouraged to begin doing some type of exercise on his own. Discussed walking by breaking up sessions into 10-15 minutes 2-3x/day. Encouraged warm-up, cool-down, and stretching. Reviewed THRR of 58-115 and keeping RPE between 11-13. Reviewed weather parameters for temperature and humidity for safe exercise outdoors. Reviewed S/S to terminate exercise and when to call MD vs 911. Reviewed use of NTG and encouraged to carry at all times. Pt verbalized understanding of home exercise Rx and was provided a copy.   Lorin Picket MS, ACSM-EP-C, CCRP

## 2020-07-02 ENCOUNTER — Other Ambulatory Visit: Payer: Self-pay

## 2020-07-02 ENCOUNTER — Encounter (HOSPITAL_COMMUNITY)
Admission: RE | Admit: 2020-07-02 | Discharge: 2020-07-02 | Disposition: A | Discharge status: Home / Self Care | Payer: No Typology Code available for payment source | Source: Ambulatory Visit | Attending: Cardiology | Admitting: Cardiology

## 2020-07-02 DIAGNOSIS — Z955 Presence of coronary angioplasty implant and graft: Secondary | ICD-10-CM

## 2020-07-02 DIAGNOSIS — I2510 Atherosclerotic heart disease of native coronary artery without angina pectoris: Secondary | ICD-10-CM | POA: Diagnosis not present

## 2020-07-04 ENCOUNTER — Encounter (HOSPITAL_COMMUNITY)
Admission: RE | Admit: 2020-07-04 | Discharge: 2020-07-04 | Disposition: A | Discharge status: Home / Self Care | Payer: No Typology Code available for payment source | Source: Ambulatory Visit | Attending: Cardiology | Admitting: Cardiology

## 2020-07-04 ENCOUNTER — Other Ambulatory Visit: Payer: Self-pay

## 2020-07-04 DIAGNOSIS — I2510 Atherosclerotic heart disease of native coronary artery without angina pectoris: Secondary | ICD-10-CM | POA: Diagnosis not present

## 2020-07-04 DIAGNOSIS — Z955 Presence of coronary angioplasty implant and graft: Secondary | ICD-10-CM

## 2020-07-07 ENCOUNTER — Encounter (HOSPITAL_COMMUNITY): Payer: No Typology Code available for payment source

## 2020-07-09 ENCOUNTER — Other Ambulatory Visit: Payer: Self-pay

## 2020-07-09 ENCOUNTER — Encounter (HOSPITAL_COMMUNITY)
Admission: RE | Admit: 2020-07-09 | Discharge: 2020-07-09 | Disposition: A | Discharge status: Home / Self Care | Payer: No Typology Code available for payment source | Source: Ambulatory Visit | Attending: Cardiology | Admitting: Cardiology

## 2020-07-09 VITALS — Ht 66.25 in | Wt 174.8 lb

## 2020-07-09 DIAGNOSIS — Z955 Presence of coronary angioplasty implant and graft: Secondary | ICD-10-CM

## 2020-07-09 DIAGNOSIS — I2510 Atherosclerotic heart disease of native coronary artery without angina pectoris: Secondary | ICD-10-CM | POA: Diagnosis not present

## 2020-07-11 ENCOUNTER — Encounter (HOSPITAL_COMMUNITY)
Admission: RE | Admit: 2020-07-11 | Discharge: 2020-07-11 | Disposition: A | Discharge status: Home / Self Care | Payer: No Typology Code available for payment source | Source: Ambulatory Visit | Attending: Cardiology | Admitting: Cardiology

## 2020-07-11 ENCOUNTER — Other Ambulatory Visit: Payer: Self-pay

## 2020-07-11 DIAGNOSIS — I2510 Atherosclerotic heart disease of native coronary artery without angina pectoris: Secondary | ICD-10-CM | POA: Diagnosis not present

## 2020-07-11 DIAGNOSIS — Z955 Presence of coronary angioplasty implant and graft: Secondary | ICD-10-CM

## 2020-07-14 ENCOUNTER — Other Ambulatory Visit: Payer: Self-pay

## 2020-07-14 ENCOUNTER — Encounter (HOSPITAL_COMMUNITY)
Admission: RE | Admit: 2020-07-14 | Discharge: 2020-07-14 | Disposition: A | Discharge status: Home / Self Care | Payer: No Typology Code available for payment source | Source: Ambulatory Visit | Attending: Cardiology | Admitting: Cardiology

## 2020-07-14 DIAGNOSIS — Z955 Presence of coronary angioplasty implant and graft: Secondary | ICD-10-CM

## 2020-07-14 DIAGNOSIS — I2510 Atherosclerotic heart disease of native coronary artery without angina pectoris: Secondary | ICD-10-CM | POA: Diagnosis not present

## 2020-07-16 ENCOUNTER — Other Ambulatory Visit: Payer: Self-pay

## 2020-07-16 ENCOUNTER — Encounter (HOSPITAL_COMMUNITY)
Admission: RE | Admit: 2020-07-16 | Discharge: 2020-07-16 | Disposition: A | Discharge status: Home / Self Care | Payer: No Typology Code available for payment source | Source: Ambulatory Visit | Attending: Cardiology | Admitting: Cardiology

## 2020-07-16 DIAGNOSIS — Z955 Presence of coronary angioplasty implant and graft: Secondary | ICD-10-CM

## 2020-07-16 DIAGNOSIS — I2510 Atherosclerotic heart disease of native coronary artery without angina pectoris: Secondary | ICD-10-CM | POA: Diagnosis not present

## 2020-07-16 NOTE — Progress Notes (Signed)
Discharge Progress Report  Patient Details  Name: Luke Harvey MRN: [id number] Date of Birth: May 24, 1945 Referring Provider:   Flowsheet Row CARDIAC REHAB PHASE II ORIENTATION from 05/22/2020 in [facility]  Referring Provider [facility] Fransico Him MD Covering       Number of Visits: 21   Reason for Discharge:  Patient reached a stable level of exercise.  Smoking History:  Social History   Tobacco Use  Smoking Status Former Smoker  . Packs/day: 1.00  . Years: 40.00  . Pack years: 40.00  . Quit date: 07/17/2008  . 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  ADL UCSD:   Initial Exercise Prescription:  Initial Exercise Prescription - 05/22/20 1300      Date of Initial Exercise RX and Referring Provider   Date 05/22/20    Referring Provider [facility] Fransico Him MD Covering    Expected Discharge Date 07/18/20      NuStep   Level 1    SPM 80    Minutes 30    METs 1.7      Prescription Details   Frequency (times per week) 3    Duration Progress to 30 minutes of continuous aerobic without signs/symptoms of physical distress      Intensity   THRR 40-80% of Max Heartrate 58-115    Ratings of Perceived Exertion 11-15    Perceived Dyspnea 0-4      Progression   Progression Continue progressive overload as per policy without signs/symptoms or physical distress.      Resistance Training   Training Prescription Yes    Weight 2    Reps 10-15           Discharge Exercise Prescription (Final Exercise Prescription Changes):  Exercise Prescription Changes - 07/18/20 1500      Response to Exercise   Blood Pressure (Admit) 108/52    Blood Pressure (Exercise) 118/68    Blood Pressure (Exit) 104/62    Heart Rate (Admit) 98 bpm    Heart Rate (Exercise) 104 bpm    Heart Rate (Exit) 93 bpm    Rating of Perceived  Exertion (Exercise) 12    Symptoms None    Comments Pt graduated from the CRP2 program today    Duration Continue with 30 min of aerobic exercise without signs/symptoms of physical distress.    Intensity THRR unchanged      Progression   Progression Continue to progress workloads to maintain intensity without signs/symptoms of physical distress.    Average METs 2.6      Resistance Training   Training Prescription Yes    Weight 3 lbs    Reps 10-15    Time 10 Minutes      Interval Training   Interval Training No      NuStep   Level 3    SPM 85    Minutes 30    METs 2.6      Home Exercise Plan   Plans to continue exercise at Home (comment)    Frequency Add 2 additional days to program exercise sessions.    Initial Home Exercises Provided 06/30/20           Functional Capacity:  6 Minute Walk    Row Name 05/22/20 1213 07/09/20 1345       6 Minute Walk   Phase Initial Discharge  Distance 837 feet 914 feet    Distance % Change -- 9.2 %    Distance Feet Change -- 77 ft    Walk Time 6 minutes 6 minutes    # of Rest Breaks 0 0    MPH 1.58 1.73    METS 1.98 2.07    RPE 15 17    Perceived Dyspnea  -- 2    VO2 Peak 6.92 7.25    Symptoms Yes (comment) Yes (comment)    Comments Right and Left Hip pain and shortness of breath Bilateral Hip pain 8/10 which is chronic, SOB RPD=2    Resting HR 78 bpm 85 bpm    Resting BP 138/68 112/52    Resting Oxygen Saturation  98 % 97 %    Exercise Oxygen Saturation  during 6 min walk 98 % 97 %    Max Ex. HR 99 bpm 105 bpm    Max Ex. BP 172/66 148/60    2 Minute Post BP 150/68  Re-check 122/70 --           Psychological, QOL, Others - Outcomes: PHQ 2/9: Depression screen [facility] 2/9 07/16/2020 05/22/2020 11/01/2012 07/17/2012  Decreased Interest 0 1 2 0  Down, Depressed, Hopeless 0 0 0 0  PHQ - 2 Score 0 1 2 0  Altered sleeping - - 0 -  Tired, decreased energy - - 3 -  Change in appetite - - 0 -  Feeling bad or failure about  yourself  - - 0 -  Trouble concentrating - - 0 -  Moving slowly or fidgety/restless - - 0 -  Suicidal thoughts - - 0 -  PHQ-9 Score - - 5 -    Quality of Life:  Quality of Life - 07/14/20 1500      Quality of Life Scores   Health/Function Post 21.77 %    Socioeconomic Post 25.14 %    Psych/Spiritual Post 26.64 %    Family Post 24.9 %    GLOBAL Post 23.93 %           Personal Goals: Goals established at orientation with interventions provided to work toward goal.  Personal Goals and Risk Factors at Admission - 05/22/20 1212      Core Components/Risk Factors/Patient Goals on Admission    Weight Management Yes;Weight Maintenance;Weight Gain    Intervention Weight Management/Obesity: Establish reasonable short term and long term weight goals.;Weight Management: Provide education and appropriate resources to help participant work on and attain dietary goals.;Weight Management: Develop a combined nutrition and exercise program designed to reach desired caloric intake, while maintaining appropriate intake of nutrient and fiber, sodium and fats, and appropriate energy expenditure required for the weight goal.    Expected Outcomes Short Term: Continue to assess and modify interventions until short term weight is achieved;Long Term: Adherence to nutrition and physical activity/exercise program aimed toward attainment of established weight goal;Weight Loss: Understanding of general recommendations for a balanced deficit meal plan, which promotes 1-2 lb weight loss per week and includes a negative energy balance of [phone] kcal/d;Understanding recommendations for meals to include 15-35% energy as protein, 25-35% energy from fat, 35-60% energy from carbohydrates, less than 266m of dietary cholesterol, 20-35 gm of total fiber daily;Understanding of distribution of calorie intake throughout the day with the consumption of 4-5 meals/snacks    Diabetes Yes    Intervention Provide education about  signs/symptoms and action to take for hypo/hyperglycemia.;Provide education about proper nutrition, including hydration, and aerobic/resistive exercise prescription along  with prescribed medications to achieve blood glucose in normal ranges: Fasting glucose 65-99 mg/dL    Expected Outcomes Short Term: Participant verbalizes understanding of the signs/symptoms and immediate care of hyper/hypoglycemia, proper foot care and importance of medication, aerobic/resistive exercise and nutrition plan for blood glucose control.;Long Term: Attainment of HbA1C < 7%.    Hypertension Yes    Intervention Provide education on lifestyle modifcations including regular physical activity/exercise, weight management, moderate sodium restriction and increased consumption of fresh fruit, vegetables, and low fat dairy, alcohol moderation, and smoking cessation.;Monitor prescription use compliance.    Expected Outcomes Short Term: Continued assessment and intervention until BP is < 140/34m HG in hypertensive participants. < 130/860mHG in hypertensive participants with diabetes, heart failure or chronic kidney disease.;Long Term: Maintenance of blood pressure at goal levels.    Lipids Yes    Intervention Provide education and support for participant on nutrition & aerobic/resistive exercise along with prescribed medications to achieve LDL <7030mHDL >54m8m  Expected Outcomes Short Term: Participant states understanding of desired cholesterol values and is compliant with medications prescribed. Participant is following exercise prescription and nutrition guidelines.;Long Term: Cholesterol controlled with medications as prescribed, with individualized exercise RX and with personalized nutrition plan. Value goals: LDL < 70mg54mL > 40 mg.            Personal Goals Discharge:  Goals and Risk Factor Review    Row Name 05/30/20 1606 06/24/20 1723 08/08/20 0947         Core Components/Risk Factors/Patient Goals Review    Personal Goals Review Weight Management/Obesity;Hypertension;Lipids;Diabetes Weight Management/Obesity;Hypertension;Lipids;Diabetes Weight Management/Obesity;Hypertension;Lipids;Diabetes     Review LarryAnmolted exercise on 05/30/20 and did fair with exercise. [place]'s vital signs and CBG's were stable. LarryNatashabeen doing well with exercise.Vital signs and CBG's have been stable LarryTyquezwell with exercise. Dionicio's vital signs and CBG's were stable. LarryKaulinleted cardiac rehab on 07/18/20.     Expected Outcomes LarryKanishk continue to participate in phase 2 cardiac rehab for exercise nutrtion and lifstyle modifications LarryAzaria continue to participate in phase 2 cardiac rehab for exercise nutrtion and lifstyle modifications LarryMarkanthonyencouraged to continue  exercise after completing cardiac rehab follow  nutrtion and lifstyle modifications            Exercise Goals and Review:  Exercise Goals    Row Name 05/22/20 1215             Exercise Goals   Increase Physical Activity Yes       Intervention Provide advice, education, support and counseling about physical activity/exercise needs.;Develop an individualized exercise prescription for aerobic and resistive training based on initial evaluation findings, risk stratification, comorbidities and participant's personal goals.       Expected Outcomes Short Term: Attend rehab on a regular basis to increase amount of physical activity.;Long Term: Add in home exercise to make exercise part of routine and to increase amount of physical activity.;Long Term: Exercising regularly at least 3-5 days a week.       Increase Strength and Stamina Yes       Intervention Provide advice, education, support and counseling about physical activity/exercise needs.;Develop an individualized exercise prescription for aerobic and resistive training based on initial evaluation findings, risk stratification, comorbidities and participant's personal goals.       Expected  Outcomes Short Term: Increase workloads from initial exercise prescription for resistance, speed, and METs.;Short Term: Perform resistance training exercises routinely during rehab and add in resistance  training at home;Long Term: Improve cardiorespiratory fitness, muscular endurance and strength as measured by increased METs and functional capacity (6MWT)       Able to understand and use rate of perceived exertion (RPE) scale Yes       Intervention Provide education and explanation on how to use RPE scale       Expected Outcomes Short Term: Able to use RPE daily in rehab to express subjective intensity level;Long Term:  Able to use RPE to guide intensity level when exercising independently       Able to understand and use Dyspnea scale Yes       Intervention Provide education and explanation on how to use Dyspnea scale       Expected Outcomes Short Term: Able to use Dyspnea scale daily in rehab to express subjective sense of shortness of breath during exertion;Long Term: Able to use Dyspnea scale to guide intensity level when exercising independently       Knowledge and understanding of Target Heart Rate Range (THRR) Yes       Intervention Provide education and explanation of THRR including how the numbers were predicted and where they are located for reference       Expected Outcomes Short Term: Able to state/look up THRR;Long Term: Able to use THRR to govern intensity when exercising independently;Short Term: Able to use daily as guideline for intensity in rehab       Understanding of Exercise Prescription Yes       Intervention Provide education, explanation, and written materials on patient's individual exercise prescription       Expected Outcomes Short Term: Able to explain program exercise prescription;Long Term: Able to explain home exercise prescription to exercise independently              Exercise Goals Re-Evaluation:  Exercise Goals Re-Evaluation    Row Name 05/30/20 1426 06/30/20  1600 07/18/20 1500         Exercise Goal Re-Evaluation   Exercise Goals Review Increase Physical Activity;Able to understand and use rate of perceived exertion (RPE) scale;Increase Strength and Stamina Increase Physical Activity;Increase Strength and Stamina;Able to understand and use rate of perceived exertion (RPE) scale;Knowledge and understanding of Target Heart Rate Range (THRR);Understanding of Exercise Prescription Increase Physical Activity;Increase Strength and Stamina;Able to understand and use rate of perceived exertion (RPE) scale;Knowledge and understanding of Target Heart Rate Range (THRR);Able to check pulse independently;Understanding of Exercise Prescription     Comments Patient able to understand and use RPE scale appropriately. Reviewed goals and home exercise Rx. Pt is not doing any exercise at home. Encouraged patient to engaged in some form of exercise such as walking. Discussed breaking walks up into 2-3 sessions per day 2x/week in addtioin to the CRP2 program. Pt voices his girlfreind has a stationary bike he might use. Pt graduated today form the [facility] program today. Pt had an averae MET level of 2.7. Pt made some progress in the program. Pt has not been walking consistantly at home. Continue to encourage patient to walk at home to improve his CV fitness. Pt does not voice, when asked,  a clear plan for what ongoing exercise he will engage in. Pt encouraged to some form of aerobic exercise 5x/week for 30 minutes.     Expected Outcomes Progress workloads as tolerated to help increase strength and stamina. Pt did not make a firm commitment to exercise at home. Will monitor progress towards home exercise. Hopefully, pt will choose to adopt an exercise  regimine at home.            Nutrition & Weight - Outcomes:  Pre Biometrics - 05/22/20 1216      Pre Biometrics   Height 5' 6.25" (1.683 m)    Weight 81.2 kg    Waist Circumference 21 inches    Hip Circumference 43 inches     Waist to Hip Ratio 0.49 %    BMI (Calculated) 28.67    Triceps Skinfold 9 mm    % Body Fat 17.2 %    Grip Strength 34 kg    Flexibility 13 in    Single Leg Stand 30 seconds           Post Biometrics - 07/09/20 1430       Post  Biometrics   Height 5' 6.25" (1.683 m)    Weight 79.3 kg    Waist Circumference 38 inches    Hip Circumference 43 inches    Waist to Hip Ratio 0.88 %    BMI (Calculated) 28    Triceps Skinfold 9 mm    % Body Fat 23.1 %    Grip Strength 38 kg    Flexibility 13 in    Single Leg Stand 30 seconds           Nutrition:  Nutrition Therapy & Goals - 06/02/20 1530      Nutrition Therapy   Diet TLC    Drug/Food Interactions Statins/Certain Fruits      Personal Nutrition Goals   Nutrition Goal Pt to build a healthy plate including vegetables, fruits, whole grains, and low-fat dairy products in a heart healthy meal plan.    Personal Goal #2 Pt to maintain weight and A1C      Intervention Plan   Intervention Prescribe, educate and counsel regarding individualized specific dietary modifications aiming towards targeted core components such as weight, hypertension, lipid management, diabetes, heart failure and other comorbidities.;Nutrition handout(s) given to patient.    Expected Outcomes Short Term Goal: Understand basic principles of dietary content, such as calories, fat, sodium, cholesterol and nutrients.           Nutrition Discharge:   Education Questionnaire Score:  Knowledge Questionnaire Score - 07/14/20 1500      Knowledge Questionnaire Score   Post Score 21/24           Goals reviewed with patient; copy given to patient.Pt graduated from cardiac rehab program today with completion of 0 exercise sessions in Phase II. Pt maintained good attendance and progressed nicely during his participation in rehab as evidenced by increased MET level.   Medication list reconciled. Repeat  PHQ score-0 .  Pt has made significant lifestyle changes and  should be commended for his success. Pt feels he has achieved his goals during cardiac rehab.   Pt was encouraged to continue to exercise upon completion of phase 2 cardiac rehab.Laryy increased his distance on his post exercise walk test by 77 feet. Barnet Pall, RN,BSN 08/08/2020 9:57 AM

## 2020-07-18 ENCOUNTER — Other Ambulatory Visit: Payer: Self-pay

## 2020-07-18 ENCOUNTER — Encounter (HOSPITAL_COMMUNITY)
Admission: RE | Admit: 2020-07-18 | Discharge: 2020-07-18 | Disposition: A | Discharge status: Home / Self Care | Payer: No Typology Code available for payment source | Source: Ambulatory Visit | Attending: Cardiology | Admitting: Cardiology

## 2020-07-18 DIAGNOSIS — Z955 Presence of coronary angioplasty implant and graft: Secondary | ICD-10-CM

## 2020-07-18 DIAGNOSIS — I2510 Atherosclerotic heart disease of native coronary artery without angina pectoris: Secondary | ICD-10-CM | POA: Diagnosis not present

## 2021-09-16 IMAGING — CR DG CHEST 2V
2 series · 2 of 2 positions shown · non-contrast
Comparison: June 08, 2016

CLINICAL DATA: Chest pain x1 day.

EXAM:
CHEST - 2 VIEW

[chest pa]
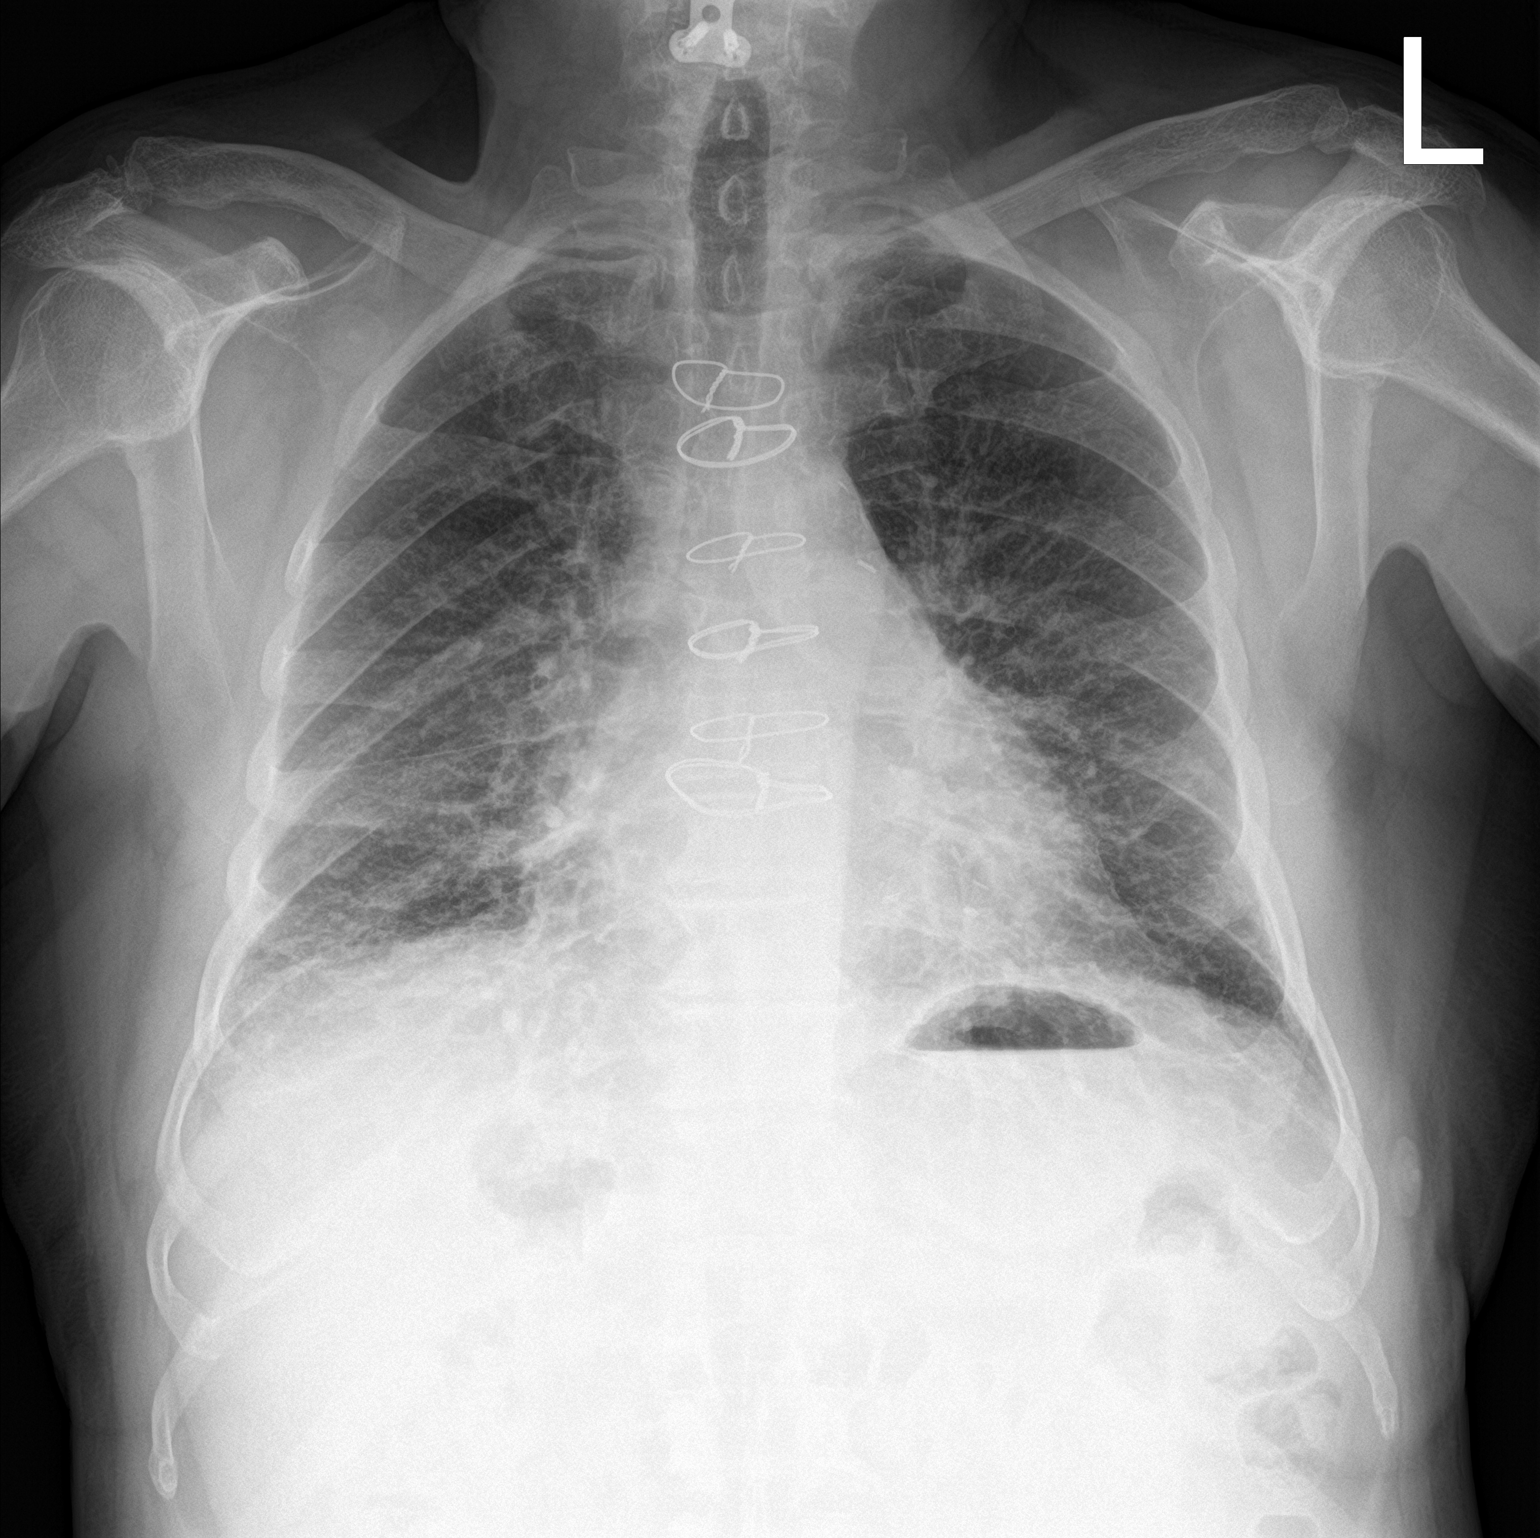

[chest lat]
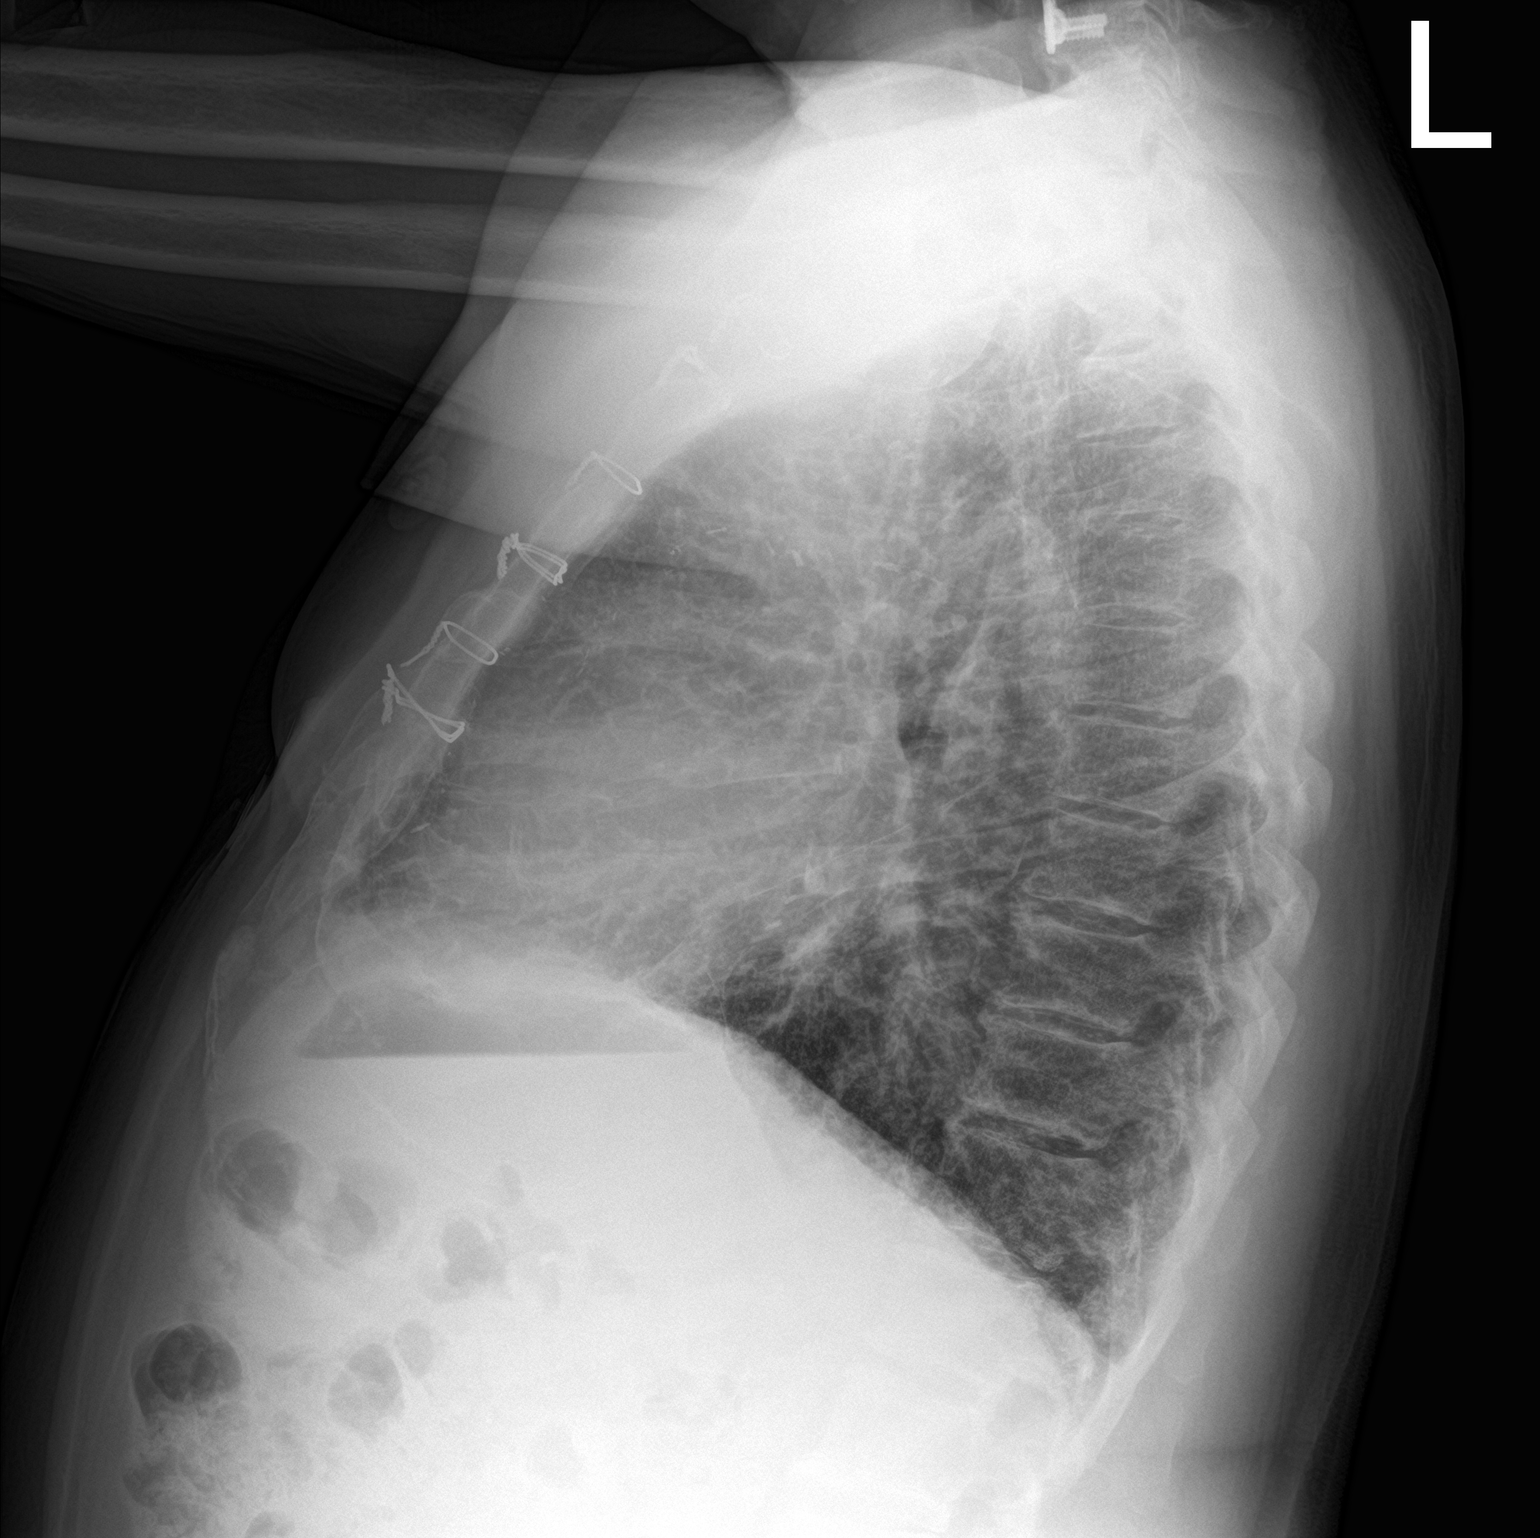

[2 of 2 positions shown; findings below may reference images not displayed]

FINDINGS: Multiple sternal wires and vascular clips are seen. Stable, diffuse
chronic appearing increased interstitial lung markings are seen.
Stable mild to moderate severity areas of scarring and/or
atelectasis are seen within the bilateral lung bases. There is no
evidence of a pleural effusion or pneumothorax. The heart size and
mediastinal contours are within normal limits. A radiopaque fusion
plate and screws are seen overlying the cervical spine.
IMPRESSION: 1. Evidence of prior median sternotomy/CABG.
2. Stable bibasilar scarring and/or atelectasis.

## 2021-11-09 ENCOUNTER — Encounter: Payer: Self-pay | Admitting: Cardiovascular Disease

## 2021-11-09 NOTE — Telephone Encounter (Signed)
Error

## 2021-11-10 ENCOUNTER — Telehealth: Payer: Self-pay | Admitting: *Deleted

## 2021-11-10 NOTE — Telephone Encounter (Signed)
Pt walked into our office complaining of recent CP and taking nitroglycerin almost daily. Denies current CP or taking any nitroglycerin. Pt informed that he has never established with [place] in the office, and that the last time he was seen was in the hospital in 2021.  He has not been follow up by [place]. [facility], but check out informed pt that we have not received referral yet. Pt advised to go to ED for evaluation as we cannot help him in the office, we don't take walk in and plus we have not seen him in many years. Pt states the [facility] told [facility] to come here to the office for evaluation.  I told pt that I don't think they meant for him to come to an office for evaluation that he has never been to before or established with. Aware we cannot treat acute issues in the office and he should go to ED. Pt was irritated with me, didn't like my answers and walked out and told me to forget it.

## 2021-11-10 NOTE — Telephone Encounter (Signed)
Followed up with pt & dtr. Informed that we can get pt seen tomorrow and scheduled them. Informed that my intent was not to upset him.  Pt apologized to me for the way he spoke to me and I told him not to worry and I know he is stressed and that I was not intending to add to that. I apologized for upsetting him in anyway, wished him a happy/better birthday. Pt and dtr appreciate the help with this.

## 2021-11-11 ENCOUNTER — Ambulatory Visit (INDEPENDENT_AMBULATORY_CARE_PROVIDER_SITE_OTHER): Payer: No Typology Code available for payment source | Admitting: Cardiovascular Disease

## 2021-11-11 ENCOUNTER — Encounter: Payer: Self-pay | Admitting: Cardiovascular Disease

## 2021-11-11 VITALS — BP 120/66 | HR 72 | Ht 66.5 in | Wt 170.4 lb

## 2021-11-11 DIAGNOSIS — I200 Unstable angina: Secondary | ICD-10-CM | POA: Diagnosis not present

## 2021-11-11 DIAGNOSIS — Z01810 Encounter for preprocedural cardiovascular examination: Secondary | ICD-10-CM | POA: Diagnosis not present
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
## 2021-11-11 NOTE — Patient Instructions (Signed)
Medication Instructions:  Your physician recommends that you continue on your current medications as directed. Please refer to the Current Medication list given to you today.  *If you need a refill on your cardiac medications before your next appointment, please call your pharmacy*   Lab Work: BMET, CBC Today If you have labs (blood work) drawn today and your tests are completely normal, you will receive your results only by: MyChart Message (if you have MyChart) OR A paper copy in the mail If you have any lab test that is abnormal or we need to change your treatment, we will call you to review the results.   Testing/Procedures: L Heart catheterization Your physician has requested that you have a cardiac catheterization. Cardiac catheterization is used to diagnose and/or treat various heart conditions. Doctors may recommend this procedure for a number of different reasons. The most common reason is to evaluate chest pain. Chest pain can be a symptom of coronary artery disease (CAD), and cardiac catheterization can show whether plaque is narrowing or blocking your heart's arteries. This procedure is also used to evaluate the valves, as well as measure the blood flow and oxygen levels in different parts of your heart. For further information please visit [email or web address] Please follow instruction sheet, as given.  Follow-Up: At [facility], you and your health needs are our priority.  As part of our continuing mission to provide you with exceptional heart care, we have created designated Provider Care Teams.  These Care Teams include your primary Cardiologist (physician) and Advanced Practice Providers (APPs -  Physician Assistants and Nurse Practitioners) who all work together to provide you with the care you need, when you need it.  We recommend signing up for the patient portal called "MyChart".  Sign up information is provided on this After Visit Summary.  MyChart is used to connect  with patients for Virtual Visits (Telemedicine).  Patients are able to view lab/test results, encounter notes, upcoming appointments, etc.  Non-urgent messages can be sent to your provider as well.   To learn more about what you can do with MyChart, go to [email or web address].    Your next appointment:   3 month(s)  The format for your next appointment:   In Person  Provider:   Lissa Hoard or [facility] {      Other Instructions  [facility] [address] OFFICE [address] [place] [address] Dept: [phone] Loc: [phone]  SADAO WEYER  11/11/2021  You are scheduled for a Cardiac Catheterization on Monday, July 24 with Dr. Cristal Deer End.  1. Please arrive at the Main Entrance A at [facility]: [address] [place], [place] [address] at 7:00 AM (This time is two hours before your procedure to ensure your preparation). Free valet parking service is available.   Special note: Every effort is made to have your procedure done on time. Please understand that emergencies sometimes delay scheduled procedures.  2. Diet: Do not eat solid foods after midnight.  You may have clear liquids until 5 AM upon the day of the procedure.  3. Labs: TODAY  4. Medication instructions in preparation for your procedure:   Contrast Allergy: No  DO NOT TAKE your Furosemide (Lasix) morning of procedure DO NOT TAKE any diabetic medications, including Jardiance, the morning of procedure. No Metformin-containing medications for 2 days after procedure On the morning of your procedure, take Aspirin 81mg  and any morning medicines NOT listed above.  You may use sips of water.  5. Plan to go home the same day, you will only stay overnight if medically necessary. 6. You MUST have a responsible adult to drive you home. 7. An adult MUST be with you the first 24 hours after you arrive home. 8. Bring a current list  of your medications, and the last time and date medication taken. 9. Bring ID and current insurance cards. 10.Please wear clothes that are easy to get on and off and wear slip-on shoes.  Thank you for allowing [place] to care for you!   -- [facility]   Important Information About Sugar
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
## 2021-11-12 ENCOUNTER — Telehealth: Payer: Self-pay | Admitting: *Deleted

## 2021-11-12 LAB — BASIC METABOLIC PANEL
BUN/Creatinine Ratio: 17 (ref 10–24)
BUN: 20 mg/dL (ref 8–27)
CO2: 21 mmol/L (ref 20–29)
Calcium: 9.7 mg/dL (ref 8.6–10.2)
Chloride: 101 mmol/L (ref 96–106)
Creatinine, Ser: 1.17 mg/dL (ref 0.76–1.27)
Glucose: 147 mg/dL — ABNORMAL HIGH (ref 70–99)
Potassium: 4.7 mmol/L (ref 3.5–5.2)
Sodium: 136 mmol/L (ref 134–144)
eGFR: 64 mL/min/{1.73_m2} (ref >59)

## 2021-11-12 LAB — CBC
Hematocrit: 42.5 % (ref 37.5–51.0)
Hemoglobin: 14.5 g/dL (ref 13.0–17.7)
MCH: 30 pg (ref 26.6–33.0)
MCHC: 34.1 g/dL (ref 31.5–35.7)
MCV: 88 fL (ref 79–97)
Platelets: 222 10*3/uL (ref 150–450)
RBC: 4.84 x10E6/uL (ref 4.14–5.80)
RDW: 12.5 % (ref 11.6–15.4)
WBC: 7.6 10*3/uL (ref 3.4–10.8)

## 2021-11-12 NOTE — Telephone Encounter (Addendum)
Cardiac Catheterization scheduled at [facility] for: Monday November 16, 2021 9 AM Arrival time and place: [facility] Main Entrance A at: 7 AM   Nothing to eat after midnight prior to procedure, clear liquids until 5 AM day of procedure.  Medication instructions: -Hold:  Jardiance/Lasix-AM of procedure -Except hold medications usual morning medications can be taken with sips of water including aspirin 81 mg and Brilinta.  Confirmed patient has responsible adult to drive home post procedure and be with patient first 24 hours after arriving home.  Patient reports no new symptoms concerning for COVID-19 in the past 10 days.  Reviewed procedure instructions with patient.

## 2021-11-16 ENCOUNTER — Encounter (HOSPITAL_COMMUNITY)
Admission: RE | Disposition: A | Discharge status: Home / Self Care | Payer: Self-pay | Source: Home / Self Care | Attending: Internal Medicine

## 2021-11-16 ENCOUNTER — Other Ambulatory Visit: Payer: Self-pay

## 2021-11-16 ENCOUNTER — Ambulatory Visit (HOSPITAL_COMMUNITY)
Admission: RE | Admit: 2021-11-16 | Discharge: 2021-11-16 | Disposition: A | Discharge status: Home / Self Care | Payer: No Typology Code available for payment source | Attending: Internal Medicine | Admitting: Internal Medicine

## 2021-11-16 DIAGNOSIS — I350 Nonrheumatic aortic (valve) stenosis: Secondary | ICD-10-CM | POA: Insufficient documentation

## 2021-11-16 DIAGNOSIS — I2510 Atherosclerotic heart disease of native coronary artery without angina pectoris: Secondary | ICD-10-CM | POA: Diagnosis present

## 2021-11-16 DIAGNOSIS — I25700 Atherosclerosis of coronary artery bypass graft(s), unspecified, with unstable angina pectoris: Secondary | ICD-10-CM | POA: Diagnosis present

## 2021-11-16 DIAGNOSIS — Z7982 Long term (current) use of aspirin: Secondary | ICD-10-CM | POA: Insufficient documentation

## 2021-11-16 DIAGNOSIS — E1140 Type 2 diabetes mellitus with diabetic neuropathy, unspecified: Secondary | ICD-10-CM | POA: Insufficient documentation

## 2021-11-16 DIAGNOSIS — I2582 Chronic total occlusion of coronary artery: Secondary | ICD-10-CM | POA: Insufficient documentation

## 2021-11-16 DIAGNOSIS — I10 Essential (primary) hypertension: Secondary | ICD-10-CM | POA: Diagnosis not present

## 2021-11-16 DIAGNOSIS — Z86718 Personal history of other venous thrombosis and embolism: Secondary | ICD-10-CM | POA: Insufficient documentation

## 2021-11-16 DIAGNOSIS — Y832 Surgical operation with anastomosis, bypass or graft as the cause of abnormal reaction of the patient, or of later complication, without mention of misadventure at the time of the procedure: Secondary | ICD-10-CM | POA: Diagnosis not present

## 2021-11-16 DIAGNOSIS — I252 Old myocardial infarction: Secondary | ICD-10-CM | POA: Insufficient documentation

## 2021-11-16 DIAGNOSIS — T82855A Stenosis of coronary artery stent, initial encounter: Secondary | ICD-10-CM | POA: Diagnosis not present

## 2021-11-16 DIAGNOSIS — Z7984 Long term (current) use of oral hypoglycemic drugs: Secondary | ICD-10-CM | POA: Insufficient documentation

## 2021-11-16 DIAGNOSIS — I25710 Atherosclerosis of autologous vein coronary artery bypass graft(s) with unstable angina pectoris: Secondary | ICD-10-CM | POA: Diagnosis not present

## 2021-11-16 DIAGNOSIS — I2584 Coronary atherosclerosis due to calcified coronary lesion: Secondary | ICD-10-CM | POA: Insufficient documentation

## 2021-11-16 DIAGNOSIS — E039 Hypothyroidism, unspecified: Secondary | ICD-10-CM | POA: Insufficient documentation

## 2021-11-16 DIAGNOSIS — E785 Hyperlipidemia, unspecified: Secondary | ICD-10-CM | POA: Diagnosis not present

## 2021-11-16 DIAGNOSIS — Z87891 Personal history of nicotine dependence: Secondary | ICD-10-CM | POA: Insufficient documentation

## 2021-11-16 DIAGNOSIS — Z955 Presence of coronary angioplasty implant and graft: Secondary | ICD-10-CM | POA: Insufficient documentation

## 2021-11-16 DIAGNOSIS — E1151 Type 2 diabetes mellitus with diabetic peripheral angiopathy without gangrene: Secondary | ICD-10-CM | POA: Insufficient documentation

## 2021-11-16 DIAGNOSIS — Z7902 Long term (current) use of antithrombotics/antiplatelets: Secondary | ICD-10-CM | POA: Insufficient documentation

## 2021-11-16 DIAGNOSIS — Z95820 Peripheral vascular angioplasty status with implants and grafts: Secondary | ICD-10-CM

## 2021-11-16 HISTORY — PX: CORONARY STENT INTERVENTION: CATH118234

## 2021-11-16 HISTORY — PX: LEFT HEART CATH AND CORS/GRAFTS ANGIOGRAPHY: CATH118250

## 2021-11-16 LAB — GLUCOSE, CAPILLARY: Glucose-Capillary: 163 mg/dL — ABNORMAL HIGH (ref 70–99)

## 2021-11-16 LAB — POCT ACTIVATED CLOTTING TIME: Activated Clotting Time: 293 seconds

## 2021-11-16 SURGERY — LEFT HEART CATH AND CORS/GRAFTS ANGIOGRAPHY
Anesthesia: LOCAL

## 2021-11-16 MED ORDER — SODIUM CHLORIDE 0.9% FLUSH: 3.0000 mL | INTRAVENOUS | Status: DC | PRN

## 2021-11-16 MED ORDER — FENTANYL CITRATE (PF) 100 MCG/2ML IJ SOLN
INTRAMUSCULAR | Status: AC
  Filled 2021-11-16: qty 2

## 2021-11-16 MED ORDER — ONDANSETRON HCL 4 MG/2ML IJ SOLN: 4.0000 mg | Freq: Four times a day (QID) | INTRAMUSCULAR | Status: DC | PRN

## 2021-11-16 MED ORDER — NITROGLYCERIN 1 MG/10 ML FOR IR/CATH LAB
INTRA_ARTERIAL | Status: DC | PRN
  Administered 2021-11-16: 200 ug via INTRACORONARY

## 2021-11-16 MED ORDER — LIDOCAINE HCL (PF) 1 % IJ SOLN
INTRAMUSCULAR | Status: AC
  Filled 2021-11-16: qty 30

## 2021-11-16 MED ORDER — TICAGRELOR 90 MG PO TABS: 90.0000 mg | ORAL_TABLET | ORAL | Status: DC

## 2021-11-16 MED ORDER — ACETAMINOPHEN 325 MG PO TABS: 650.0000 mg | ORAL_TABLET | ORAL | Status: DC | PRN

## 2021-11-16 MED ORDER — SODIUM CHLORIDE 0.9% FLUSH: 3.0000 mL | Freq: Two times a day (BID) | INTRAVENOUS | Status: DC

## 2021-11-16 MED ORDER — SODIUM CHLORIDE 0.9 % WEIGHT BASED INFUSION
1.0000 mL/kg/h | INTRAVENOUS | Status: DC
Start: 2021-11-16 — End: 2021-11-16

## 2021-11-16 MED ORDER — FENTANYL CITRATE (PF) 100 MCG/2ML IJ SOLN
INTRAMUSCULAR | Status: DC | PRN
  Administered 2021-11-16: 25 ug via INTRAVENOUS

## 2021-11-16 MED ORDER — IOHEXOL 350 MG/ML SOLN
INTRAVENOUS | Status: DC | PRN
  Administered 2021-11-16: 105 mL

## 2021-11-16 MED ORDER — TICAGRELOR 90 MG PO TABS: 90.0000 mg | ORAL_TABLET | Freq: Two times a day (BID) | ORAL | Status: DC

## 2021-11-16 MED ORDER — VERAPAMIL HCL 2.5 MG/ML IV SOLN
INTRAVENOUS | Status: DC | PRN
  Administered 2021-11-16: 10 mL via INTRA_ARTERIAL

## 2021-11-16 MED ORDER — HEPARIN (PORCINE) IN NACL 1000-0.9 UT/500ML-% IV SOLN
INTRAVENOUS | Status: DC | PRN
  Administered 2021-11-16 (×2): 500 mL

## 2021-11-16 MED ORDER — SODIUM CHLORIDE 0.9 % IV SOLN
250.0000 mL | INTRAVENOUS | Status: DC | PRN
Start: 2021-11-16 — End: 2021-11-16

## 2021-11-16 MED ORDER — VERAPAMIL HCL 2.5 MG/ML IV SOLN
INTRAVENOUS | Status: DC | PRN
  Administered 2021-11-16 (×2): 500 ug via INTRACORONARY

## 2021-11-16 MED ORDER — MIDAZOLAM HCL 2 MG/2ML IJ SOLN
INTRAMUSCULAR | Status: AC
  Filled 2021-11-16: qty 2

## 2021-11-16 MED ORDER — HEPARIN (PORCINE) IN NACL 1000-0.9 UT/500ML-% IV SOLN
INTRAVENOUS | Status: AC
  Filled 2021-11-16: qty 1000

## 2021-11-16 MED ORDER — ASPIRIN 81 MG PO CHEW: 81.0000 mg | CHEWABLE_TABLET | ORAL | Status: DC

## 2021-11-16 MED ORDER — ASPIRIN 81 MG PO CHEW: 81.0000 mg | CHEWABLE_TABLET | Freq: Every day | ORAL | Status: DC

## 2021-11-16 MED ORDER — MIDAZOLAM HCL 2 MG/2ML IJ SOLN
INTRAMUSCULAR | Status: DC | PRN
  Administered 2021-11-16: 1 mg via INTRAVENOUS

## 2021-11-16 MED ORDER — HEPARIN SODIUM (PORCINE) 1000 UNIT/ML IJ SOLN
INTRAMUSCULAR | Status: AC
  Filled 2021-11-16: qty 10

## 2021-11-16 MED ORDER — LIDOCAINE HCL (PF) 1 % IJ SOLN
INTRAMUSCULAR | Status: DC | PRN
  Administered 2021-11-16: 2 mL

## 2021-11-16 MED ORDER — SODIUM CHLORIDE 0.9 % IV SOLN: 250.0000 mL | INTRAVENOUS | Status: DC | PRN

## 2021-11-16 MED ORDER — LABETALOL HCL 5 MG/ML IV SOLN: 10.0000 mg | INTRAVENOUS | Status: DC | PRN

## 2021-11-16 MED ORDER — HEPARIN SODIUM (PORCINE) 1000 UNIT/ML IJ SOLN
INTRAMUSCULAR | Status: DC | PRN
  Administered 2021-11-16 (×2): 4000 [IU] via INTRAVENOUS

## 2021-11-16 MED ORDER — SODIUM CHLORIDE 0.9 % WEIGHT BASED INFUSION
3.0000 mL/kg/h | INTRAVENOUS | Status: AC
  Administered 2021-11-16: 3 mL/kg/h via INTRAVENOUS

## 2021-11-16 MED ORDER — NITROGLYCERIN 1 MG/10 ML FOR IR/CATH LAB
INTRA_ARTERIAL | Status: AC
  Filled 2021-11-16: qty 10

## 2021-11-16 MED ORDER — VERAPAMIL HCL 2.5 MG/ML IV SOLN
INTRAVENOUS | Status: AC
  Filled 2021-11-16: qty 2

## 2021-11-16 MED ORDER — SODIUM CHLORIDE 0.9 % IV SOLN
INTRAVENOUS | Status: DC
Start: 2021-11-16 — End: 2021-11-16

## 2021-11-16 MED ORDER — HYDRALAZINE HCL 20 MG/ML IJ SOLN: 10.0000 mg | INTRAMUSCULAR | Status: DC | PRN

## 2021-11-16 SURGICAL SUPPLY — 24 items
BALLN SAPPHIRE 2.5X12 (BALLOONS) ×2
BALLOON SAPPHIRE 2.5X12 (BALLOONS) IMPLANT
BAND CMPR LRG ZPHR (HEMOSTASIS) ×1
BAND ZEPHYR COMPRESS 30 LONG (HEMOSTASIS) ×1 IMPLANT
CATH INFINITI 5 FR IM (CATHETERS) ×1 IMPLANT
CATH INFINITI 5 FR MPA2 (CATHETERS) ×1 IMPLANT
CATH INFINITI 5FR AL1 (CATHETERS) ×1 IMPLANT
CATH INFINITI 5FR ANG PIGTAIL (CATHETERS) ×1 IMPLANT
CATH LAUNCHER 6FR AL1 (CATHETERS) IMPLANT
CATHETER LAUNCHER 6FR AL1 (CATHETERS) ×2
ELECT DEFIB PAD ADLT CADENCE (PAD) ×1 IMPLANT
GLIDESHEATH SLEND SS 6F .021 (SHEATH) ×1 IMPLANT
GUIDEWIRE INQWIRE 1.5J.035X260 (WIRE) IMPLANT
INQWIRE 1.5J .035X260CM (WIRE) ×2
KIT ENCORE 26 ADVANTAGE (KITS) ×1 IMPLANT
KIT HEART LEFT (KITS) ×3 IMPLANT
PACK CARDIAC CATHETERIZATION (CUSTOM PROCEDURE TRAY) ×3 IMPLANT
STENT SYNERGY XD 3.0X16 (Permanent Stent) IMPLANT
SYNERGY XD 3.0X16 (Permanent Stent) ×2 IMPLANT
SYR MEDRAD MARK 7 150ML (SYRINGE) ×3 IMPLANT
TRANSDUCER W/STOPCOCK (MISCELLANEOUS) ×3 IMPLANT
TUBING CIL FLEX 10 FLL-RA (TUBING) ×3 IMPLANT
WIRE EMERALD ST .035X150CM (WIRE) ×1 IMPLANT
WIRE RUNTHROUGH .014X180CM (WIRE) ×1 IMPLANT

## 2021-11-16 NOTE — Discharge Summary (Signed)
Discharge Summary for Same Day PCI   Patient ID: Luke Harvey MRN: [id number]; DOB: 05-Jan-1946  Admit date: 11/16/2021 Discharge date: 11/16/2021  Primary Care Provider: [facility]  Primary Cardiologist: Lance Muss, MD  Primary Electrophysiologist:  None   Discharge Diagnoses    Principal Problem:   Coronary artery disease with hx CABG Active Problems:   HLD (hyperlipidemia)  Diagnostic Studies/Procedures    Cardiac Catheterization 11/16/2021:  Conclusions: Severe native left coronary artery diseas with 95% distal LMCA disease extending into LAD and LCx as well as CTO of proximal/mid LAD. Patent RCA stents with 30% in-stent restenosis in the mid/distal stent.  Proximal stent is widely patent. Widely patent LIMA-LAD. Patent SVG-OM with 50% ostial and 25% proximal in-stent restenosis.  There is a 95% stenosis at the proximal margin of the mid graft stent as well as a 40-50% lesion at the distal stent edge. Mildly elevated left ventricular filling pressure (LVEDP 18 mmHg) with normal systolic function (LVEF 55-65%). Probably moderate aortic valve stenosis (peak-to-peak gradient 20 mmHg). Successful PCI to 95% stenosis in mid portion of SVG-OM using Synergy 3.0 x 16 mm drug-eluting stent that overlaps the previously placed stent.  There is 0% residual stenosis with TIMI-3 flow following PCI.   Recommendations: Continue indefinite dual antiplatelet therapy with aspirin and ticagrelor.  One could consider stopping aspirin as soon as 3 months post-PCI if there is concern for high bleeding risk. Medical therapy of mild-moderate in-stent restenosis involving SVG-OM and mid/distal RCA. Aggressive secondary prevention of coronary artery disease. Anticipate same-day discharge if no post-PCI complications occur during 6 hour monitoring period.   Yvonne Kendall, MD [facility] _____________   History of Present Illness     Luke Harvey is a 76 y.o. male with  past medical history of CAD status post CABG 2 vessel '14, moderate aortic stenosis, diabetes, CAD with lower extremity claudication, hypothyroidism, tobacco use, prior DVT and hypertension.  Underwent CABG in 2014 with LIMA to LAD and SVG to circumflex.  In 2015 he underwent stenting of his on bypass to the RCA.  He was admitted 03/2020 with unstable angina.  Underwent cardiac catheterization significantly calcified proximal to mid RCA stenosis of 50 to 80% with vessel angulation and 95% stenosis of previously placed to the distal RCA.  Underwent CSI orbital atherectomy shockwave IVUS lithotripsy and stenting to the distal RCA.  Placed on DAPT with aspirin/Brilinta with recommendations for at least 1 year.  Most recently seen in the office on 7/19 and add on to Dr. Elease Hashimoto schedule.  Reported he was having increased chest pain/chest tightness while walking hills.  Reported this was very similar to what he experienced with prior anginal episodes requiring stenting.  Was set up for cardiac catheterization as an outpatient.  Hospital Course     The patient underwent cardiac cath as noted above with patent LIMA to LAD, SVG to OM with 50% ostial and 25% proximal in-stent restenosis.  95% stenosis at the proximal marginal of mid graft stent.  Successful PCI/DES x1 to SVG to OM overlapping previously placed stent. Plan for DAPT with ASA/Brilinta ideally indefinitely if patient able to tolerate.  Consider stopping aspirin as soon as 3 months if high risk of bleeding. The patient was seen by cardiac rehab while in short stay. There were no observed complications post cath. Radial cath site was re-evaluated prior to discharge and found to be stable without any complications. Instructions/precautions regarding cath site care were given prior to discharge.  Luke Harvey was seen by Dr. Okey Dupre and determined stable for discharge home. Follow up with our office has been arranged. Medications are listed below. Pertinent  changes include n/a.  _____________  Cath/PCI Registry Performance & Quality Measures: Aspirin prescribed? - Yes ADP Receptor Inhibitor (Plavix/Clopidogrel, Brilinta/Ticagrelor or Effient/Prasugrel) prescribed (includes medically managed patients)? - Yes High Intensity Statin (Lipitor 40-80mg  or Crestor 20-40mg ) prescribed? - Yes For EF <40%, was ACEI/ARB prescribed? - Not Applicable (EF >/= 40%) For EF <40%, Aldosterone Antagonist (Spironolactone or Eplerenone) prescribed? - Not Applicable (EF >/= 40%) Cardiac Rehab Phase II ordered (Included Medically managed Patients)? - Yes  _____________   Discharge Vitals Blood pressure (!) 152/98, pulse 62, temperature 97.6 F (36.4 C), temperature source Temporal, resp. rate 17, height 5' 6.5" (1.689 m), weight 76.2 kg, SpO2 100 %.  Filed Weights   11/16/21 0718  Weight: 76.2 kg    Last Labs & Radiologic Studies    CBC No results for input(s): "WBC", "NEUTROABS", "HGB", "HCT", "MCV", "PLT" in the last 72 hours. Basic Metabolic Panel No results for input(s): "NA", "K", "CL", "CO2", "GLUCOSE", "BUN", "CREATININE", "CALCIUM", "MG", "PHOS" in the last 72 hours. Liver Function Tests No results for input(s): "AST", "ALT", "ALKPHOS", "BILITOT", "PROT", "ALBUMIN" in the last 72 hours. No results for input(s): "LIPASE", "AMYLASE" in the last 72 hours. High Sensitivity Troponin:   No results for input(s): "TROPONINIHS" in the last 720 hours.  BNP Invalid input(s): "POCBNP" D-Dimer No results for input(s): "DDIMER" in the last 72 hours. Hemoglobin A1C No results for input(s): "HGBA1C" in the last 72 hours. Fasting Lipid Panel No results for input(s): "CHOL", "HDL", "LDLCALC", "TRIG", "CHOLHDL", "LDLDIRECT" in the last 72 hours. Thyroid Function Tests No results for input(s): "TSH", "T4TOTAL", "T3FREE", "THYROIDAB" in the last 72 hours.  Invalid input(s): "FREET3" _____________  CARDIAC CATHETERIZATION  Result Date:  11/16/2021 Conclusions: Severe native left coronary artery diseas with 95% distal LMCA disease extending into LAD and LCx as well as CTO of proximal/mid LAD. Patent RCA stents with 30% in-stent restenosis in the mid/distal stent.  Proximal stent is widely patent. Widely patent LIMA-LAD. Patent SVG-OM with 50% ostial and 25% proximal in-stent restenosis.  There is a 95% stenosis at the proximal margin of the mid graft stent as well as a 40-50% lesion at the distal stent edge. Mildly elevated left ventricular filling pressure (LVEDP 18 mmHg) with normal systolic function (LVEF 55-65%). Probably moderate aortic valve stenosis (peak-to-peak gradient 20 mmHg). Successful PCI to 95% stenosis in mid portion of SVG-OM using Synergy 3.0 x 16 mm drug-eluting stent that overlaps the previously placed stent.  There is 0% residual stenosis with TIMI-3 flow following PCI. Recommendations: Continue indefinite dual antiplatelet therapy with aspirin and ticagrelor.  One could consider stopping aspirin as soon as 3 months post-PCI if there is concern for high bleeding risk. Medical therapy of mild-moderate in-stent restenosis involving SVG-OM and mid/distal RCA. Aggressive secondary prevention of coronary artery disease. Anticipate same-day discharge if no post-PCI complications occur during 6 hour monitoring period. Yvonne Kendall, MD [facility]   Disposition   Pt is being discharged home today in good condition.  Follow-up Plans & Appointments     Follow-up Information     Levi Aland, NP Follow up on 11/24/2021.   Specialty: Nurse Practitioner Why: at 10am for your follow up appt with Dr. Waverly Ferrari' NP Contact information: [address] [place] [address] [phone]  Discharge Instructions     Amb Referral to Cardiac Rehabilitation   Complete by: As directed    Diagnosis:  PTCA Coronary Stents     After initial evaluation and assessments completed: Virtual  Based Care may be provided alone or in conjunction with Phase 2 Cardiac Rehab based on patient barriers.: Yes        Discharge Medications   Allergies as of 11/16/2021       Reactions   Tetracyclines & Related Hives, Itching   Lisinopril Cough   Losartan    Other reaction(s): Cough   Lipitor [atorvastatin] Other (See Comments)   Sore muscles        Medication List     TAKE these medications    acetaminophen 325 MG tablet Commonly known as: TYLENOL Take 2 tablets (650 mg total) by mouth every 4 (four) hours as needed for headache or mild pain.   Alpha-Lipoic Acid 200 MG Caps Take 200 mg by mouth daily.   APPLE CIDER VINEGAR PO Take 450 mg by mouth daily.   aspirin EC 81 MG tablet Take 81 mg by mouth daily.   cholecalciferol 25 MCG (1000 UNIT) tablet Commonly known as: VITAMIN D Take 1,000 Units by mouth daily.   citalopram 40 MG tablet Commonly known as: CELEXA Take 20 mg by mouth daily.   Coenzyme Q10 200 MG capsule Take 200 mg by mouth daily.   doxycycline 50 MG capsule Commonly known as: MONODOX Take 50 mg by mouth daily.   empagliflozin 25 MG Tabs tablet Commonly known as: JARDIANCE Take 25 mg by mouth daily.   furosemide 20 MG tablet Commonly known as: LASIX Take 20 mg by mouth as needed (leg swelling).   L-CARNITINE PO Take 1 tablet by mouth daily.   levothyroxine 50 MCG tablet Commonly known as: SYNTHROID Take 75 mcg by mouth daily before breakfast.   magnesium oxide 400 (240 Mg) MG tablet Commonly known as: MAG-OX Take 400 mg by mouth daily.   metoprolol tartrate 50 MG tablet Commonly known as: LOPRESSOR Take 25 mg by mouth 2 (two) times daily.   milk thistle 175 MG tablet Take 175 mg by mouth daily.   nitroGLYCERIN 0.4 MG SL tablet Commonly known as: NITROSTAT Place 0.4 mg under the tongue every 5 (five) minutes as needed for chest pain.   OVER THE COUNTER MEDICATION Take 1 capsule by mouth 3 (three) times daily. VisiUltra    OVER THE COUNTER MEDICATION Take 1 Capful by mouth 3 (three) times daily. Juvenon   OVER THE COUNTER MEDICATION Take 1 Capful by mouth 2 (two) times daily. Nerve Control 911   OVER THE COUNTER MEDICATION Take 1 capsule by mouth daily. Blood bo0st formulia   OVER THE COUNTER MEDICATION Take 1 capsule by mouth daily. Glucofort   OVER THE COUNTER MEDICATION Take 3 capsules by mouth daily. Balance of nature  Fruits and   OVER THE COUNTER MEDICATION Take 3 capsules by mouth daily. Balance of Nature Vegetable   OVER THE COUNTER MEDICATION Take 1 Scoop by mouth daily. patriot power greens mix with water   OVER THE COUNTER MEDICATION Take 0.5 Scoops by mouth daily. Vitalreds Fruit mix with water   polycarbophil 625 MG tablet Commonly known as: FIBERCON Take 625 mg by mouth daily.   Hair/Skin/Nails Caps Take 5,000 mcg by mouth daily.   PRESERVISION AREDS PO Take 1 tablet by mouth 2 (two) times daily.   PROBIOTIC-PREBIOTIC PO Take 1 tablet by mouth 3 (three) times daily.  Bio Complete 3   Quercetin 500 MG Caps Take 500 mg by mouth daily.   rosuvastatin 40 MG tablet Commonly known as: CRESTOR Take 1 tablet (40 mg total) by mouth daily.   Saw Palmetto 450 MG Caps Take 450 mg by mouth daily.   Selenimin-200 200 MCG Tabs tablet Generic drug: selenium Take 200 mcg by mouth daily.   TART CHERRY PO Take 1,200 mg by mouth daily. Extract   ticagrelor 90 MG Tabs tablet Commonly known as: Brilinta Take 1 tablet (90 mg total) by mouth 2 (two) times daily.   Turmeric Curcumin 500 MG Caps Take 500 mg by mouth daily.   VITAMIN B12 PO Take 3,000 mcg by mouth daily.          Allergies Allergies  Allergen Reactions   Tetracyclines & Related Hives and Itching   Lisinopril Cough   Losartan     Other reaction(s): Cough   Lipitor [Atorvastatin] Other (See Comments)    Sore muscles    Outstanding Labs/Studies   N/a  Duration of Discharge Encounter   Greater  than 30 minutes including physician time.  Signed, Laverda Page, NP 11/16/2021, 3:05 PM

## 2021-11-16 NOTE — Discharge Instructions (Signed)

## 2021-11-16 NOTE — Progress Notes (Signed)
Report received from vivian, rn

## 2021-11-16 NOTE — Progress Notes (Signed)
CARDIAC REHAB PHASE I     Pt education including site care, risk factors, exercise guidelines,asa and Brilinta importance, restrictions, heart healthy diabetic diet, and CRP2 completed.  Pt might be interested in CRP2. Order placed for CRP2 at [facility]. All questions and concerns addressed.    [id number] Woodroe Chen, RN BSN 11/16/2021 11:43 AM

## 2021-11-16 NOTE — Progress Notes (Signed)
Patient was given discharge instructions. He verbalized understanding. 

## 2021-11-16 NOTE — Interval H&P Note (Signed)
History and Physical Interval Note:  11/16/2021 8:21 AM  Luke Harvey  has presented today for surgery, with the diagnosis of unstable angina.  The various methods of treatment have been discussed with the patient and family. After consideration of risks, benefits and other options for treatment, the patient has consented to  Procedure(s): LEFT HEART CATH AND CORS/GRAFTS ANGIOGRAPHY (N/A) as a surgical intervention.  The patient's history has been reviewed, patient examined, no change in status, stable for surgery.  I have reviewed the patient's chart and labs.  Questions were answered to the patient's satisfaction.    Cath Lab Visit (complete for each Cath Lab visit)  Clinical Evaluation Leading to the Procedure:   ACS: No.    Non-ACS:    Anginal Classification: CCS III  Anti-ischemic medical therapy: Minimal Therapy (1 class of medications)  Non-Invasive Test Results: No non-invasive testing performed  Prior CABG: Previous CABG  Luke Harvey

## 2021-11-17 ENCOUNTER — Encounter (HOSPITAL_COMMUNITY): Payer: Self-pay | Admitting: Internal Medicine

## 2021-11-22 NOTE — Progress Notes (Unsigned)
Cardiology Office Note:    Date:  11/24/2021   ID:  Paiden, Cavell 1945/07/04, MRN [id number]  PCP:  Clinic, [facility]   [facility] Providers Cardiologist:  Lance Muss, MD     Referring MD: [facility], [facility]   Chief Complaint: follow-up CAD s/p PCI  History of Present Illness:    BAXTER GONZALEZ is a pleasant 76 y.o. male with a hx of CAD s/p CABG x 2, cardiomyopathy with EF 45 to 45% in 2018, normalized in 03/2020, mild/moderate aortic stenosis, carotid artery disease, DVT, hypertension, hyperlipidemia, prior alcohol abuse, and hypothyroidism.  CABG x 2 in [place] in 2014 with LIMA-LAD, SVG to circumflex marginal.  In 2015 he underwent stenting of his own bypass to the RCA . Cath 05/2016 revealed patent grafts, 20 to 30% proximal to mid RCA stenoses, 95% mid in-stent restenosis in previously placed stent in region of acute marginal with successful PCI/DES of ISR of RCA.  Admission for unstable angina with catheterization 04/02/2020 revealed significantly calcified proximal to mid RCA stenosis 50-80% with vessel angulation and 95% stenosis of previously placed DES to distal RCA. Underwent CSI orbital atherectomy shockwave IVUS lithotripsy and stenting to distal RCA.  Previously a patient of [facility] Sink for cardiology, walked into our office on 11/11/2021 and seen by Dr. Elease Hashimoto as urgent DOD visit.  He had symptoms of progressive chest tightness and anginal-like chest pain with exertion, very similar to previous episodes of angina occurring every time he attempted to walk any minimal distance, concerning for unstable angina. LHC 11/16/21 revealed severe native disease LAD with 95% distal LMCA disease extending into LAD and LCx as well as CTO of proximal/mid LAD, patent RCA stents with 30% ISR in mid/distal stent proximal stent widely patent, widely patent LIMA-LAD graft, patent SVG to OM with 50% ostial and 25% proximal ISR, 95% stenosis at proximal margin of mid  graft stent as well as 40 to 50% lesion at the distal stent edge.  Mildly elevated LVEDP at 18 mmHg, normal LVEF 55 to 65%, probable moderate aortic valve stenosis.  Successful PCI to 95% stenosis in midportion of SVG to OM with DES. Plan to continue DAPT with consideration to stop ASA as soon as 3 months post PCI if concern for high bleeding risk, medical therapy of mild-moderate ISR involving SVG-OM and mid/distal RCA.   Today, he is here alone today for post PCI follow-up. He reports no chest pain or dyspnea.  He denies lower extremity edema, fatigue, palpitations, melena, hematuria, hemoptysis, diaphoresis, weakness, presyncope, syncope, orthopnea, and PND. Has not been very active, cannot tolerate the heat and is limited by left hip pain. Mows grass with a riding lawn mower. Notes a little discomfort in his esophagus and at top of sternum with taking pills. Is followed for primary care and cardiology at the [facility]. Is not sure if he will continue to follow-up here or at [place] only. Encouraged him to call back to cancel October appointment if he is not going to continue to be seen here.   Past Medical History:  Diagnosis Date   Age-related macular degeneration, dry, both eyes    Anxiety    Aortic stenosis    Arthritis    "probably all over" (06/08/2016)   Cardiomyopathy (HCC)    a. EF 40-45% in 2018, normalized in 03/2020.   Chronic back pain    "started in my lower back; going up my back in the last couple months" (06/08/2016)   Chronic sinusitis  Coronary artery disease    a. s/p CABGx 2V (2014 in [place])  b. PCI (2015 in [place]). c. PCI 2018 ([facility]). d. PCI 03/2020 (complex - [facility]).   Diabetic peripheral neuropathy (HCC)    DVT (deep venous thrombosis) (HCC)    "left groin; it was there when I had bypass OR; get it checked q yr; still there now" (06/08/2016)   GERD (gastroesophageal reflux disease)    Hepatitis B    History of bleeding ulcers    History of diverticulitis    History of  hiatal hernia    HLD (hyperlipidemia)    HTN (hypertension)    LV dysfunction, post MI EF 40-45%. 06/09/2016   NSTEMI (non-ST elevated myocardial infarction) (HCC) 06/08/2016   Pneumonia    "3-4 times maybe" (06/08/2016)   PVD (peripheral vascular disease) (HCC)    Recovering alcoholic (HCC)    "picked up my 30 year chip the other day" (06/08/2016)   S/P angioplasty with stent, 06/08/16 DES, for in-stent restenosis in RCA. 06/09/2016   Sleep apnea    "got a mask after OR; don't use it" (06/08/2016)   Type II diabetes mellitus (HCC)     Past Surgical History:  Procedure Laterality Date   ANTERIOR CERVICAL DECOMP/DISCECTOMY FUSION     BACK SURGERY     CARDIAC CATHETERIZATION  03/2012   "led to bypass"   CAROTID ENDARTERECTOMY Right ~ 2014   COLONOSCOPY W/ BIOPSIES AND POLYPECTOMY     CORONARY ANGIOPLASTY WITH STENT PLACEMENT  2015; 06/08/2016   CORONARY ARTERY BYPASS GRAFT  04/10/2012   CORONARY ATHERECTOMY N/A 04/04/2020   Procedure: CORONARY ATHERECTOMY;  Surgeon: Lennette Bihari, MD;  Location: [facility] INVASIVE CV LAB;  Service: Cardiovascular;  Laterality: N/A;   CORONARY STENT INTERVENTION N/A 06/08/2016   Procedure: Coronary Stent Intervention;  Surgeon: Lennette Bihari, MD;  Location: [facility] INVASIVE CV LAB;  Service: Cardiovascular;  Laterality: N/A;   CORONARY STENT INTERVENTION N/A 04/02/2020   Procedure: CORONARY STENT INTERVENTION;  Surgeon: Marykay Lex, MD;  Location: [facility] INVASIVE CV LAB;  Service: Cardiovascular;  Laterality: N/A;   CORONARY STENT INTERVENTION N/A 04/04/2020   Procedure: CORONARY STENT INTERVENTION;  Surgeon: Lennette Bihari, MD;  Location: [facility] INVASIVE CV LAB;  Service: Cardiovascular;  Laterality: N/A;   CORONARY STENT INTERVENTION N/A 11/16/2021   Procedure: CORONARY STENT INTERVENTION;  Surgeon: Yvonne Kendall, MD;  Location: [facility];  Service: Cardiovascular;  Laterality: N/A;   INTRAVASCULAR ULTRASOUND/IVUS N/A 04/04/2020   Procedure: Intravascular  Ultrasound/IVUS;  Surgeon: Lennette Bihari, MD;  Location: [facility] INVASIVE CV LAB;  Service: Cardiovascular;  Laterality: N/A;   JOINT REPLACEMENT     LEFT HEART CATH AND CORONARY ANGIOGRAPHY N/A 06/08/2016   Procedure: Left Heart Cath and Coronary Angiography;  Surgeon: Lennette Bihari, MD;  Location: [facility]VAS[facility];  Service: Cardiovascular;  Laterality: N/A;   LEFT HEART CATH AND CORS/GRAFTS ANGIOGRAPHY N/A 04/02/2020   Procedure: LEFT HEART CATH AND CORS/GRAFTS ANGIOGRAPHY;  Surgeon: Marykay Lex, MD;  Location: [facility] INVASIVE CV LAB;  Service: Cardiovascular;  Laterality: N/A;   LEFT HEART CATH AND CORS/GRAFTS ANGIOGRAPHY N/A 11/16/2021   Procedure: LEFT HEART CATH AND CORS/GRAFTS ANGIOGRAPHY;  Surgeon: Yvonne Kendall, MD;  Location: [facility];  Service: Cardiovascular;  Laterality: N/A;   REVISION TOTAL HIP ARTHROPLASTY Left ~ 2004   TEMPORARY PACEMAKER N/A 04/04/2020   Procedure: TEMPORARY PACEMAKER;  Surgeon: Lennette Bihari, MD;  Location: [facility] CV [facility];  Service: Cardiovascular;  Laterality: N/A;   TONSILLECTOMY     TOTAL HIP ARTHROPLASTY Left 1990s    Current Medications: Current Meds  Medication Sig   acetaminophen (TYLENOL) 325 MG tablet Take 2 tablets (650 mg total) by mouth every 4 (four) hours as needed for headache or mild pain.   Alpha-Lipoic Acid 200 MG CAPS Take 200 mg by mouth daily.   APPLE CIDER VINEGAR PO Take 450 mg by mouth daily.   aspirin EC 81 MG tablet Take 81 mg by mouth daily.   Bacillus Coagulans-Inulin (PROBIOTIC-PREBIOTIC PO) Take 1 tablet by mouth 3 (three) times daily. Bio Complete 3   cholecalciferol (VITAMIN D) 25 MCG (1000 UNIT) tablet Take 1,000 Units by mouth daily.   citalopram (CELEXA) 40 MG tablet Take 20 mg by mouth daily.    Coenzyme Q10 200 MG capsule Take 200 mg by mouth daily.   Cyanocobalamin (VITAMIN B12 PO) Take 3,000 mcg by mouth daily.   doxycycline (MONODOX) 50 MG capsule Take 50 mg by mouth daily.   empagliflozin  (JARDIANCE) 25 MG TABS tablet Take 25 mg by mouth daily.   furosemide (LASIX) 20 MG tablet Take 20 mg by mouth as needed (leg swelling).   levOCARNitine (L-CARNITINE PO) Take 1 tablet by mouth daily.   levothyroxine (SYNTHROID, LEVOTHROID) 50 MCG tablet Take 75 mcg by mouth daily before breakfast.   magnesium oxide (MAG-OX) 400 (240 Mg) MG tablet Take 400 mg by mouth daily.   metoprolol (LOPRESSOR) 50 MG tablet Take 25 mg by mouth 2 (two) times daily.    milk thistle 175 MG tablet Take 175 mg by mouth daily.   Multiple Vitamins-Minerals (HAIR/SKIN/NAILS) CAPS Take 5,000 mcg by mouth daily.   Multiple Vitamins-Minerals (PRESERVISION AREDS PO) Take 1 tablet by mouth 2 (two) times daily.   nitroGLYCERIN (NITROSTAT) 0.4 MG SL tablet Place 0.4 mg under the tongue every 5 (five) minutes as needed for chest pain.   OVER THE COUNTER MEDICATION Take 1 capsule by mouth 3 (three) times daily. VisiUltra   OVER THE COUNTER MEDICATION Take 1 Capful by mouth 3 (three) times daily. Juvenon   OVER THE COUNTER MEDICATION Take 1 Capful by mouth 2 (two) times daily. Nerve Control 911   OVER THE COUNTER MEDICATION Take 1 capsule by mouth daily. Blood bo0st formulia   OVER THE COUNTER MEDICATION Take 1 capsule by mouth daily. Glucofort   OVER THE COUNTER MEDICATION Take 3 capsules by mouth daily. Balance of nature  Fruits and   OVER THE COUNTER MEDICATION Take 3 capsules by mouth daily. Balance of Nature Vegetable   OVER THE COUNTER MEDICATION Take 1 Scoop by mouth daily. patriot power greens mix with water   OVER THE COUNTER MEDICATION Take 0.5 Scoops by mouth daily. Vitalreds Fruit mix with water   polycarbophil (FIBERCON) 625 MG tablet Take 625 mg by mouth daily.   Quercetin 500 MG CAPS Take 500 mg by mouth daily.   rosuvastatin (CRESTOR) 40 MG tablet Take 1 tablet (40 mg total) by mouth daily.   Saw Palmetto 450 MG CAPS Take 450 mg by mouth daily.   selenium (SELENIMIN-200) 200 MCG TABS tablet Take 200 mcg  by mouth daily.   TART CHERRY PO Take 1,200 mg by mouth daily. Extract   ticagrelor (BRILINTA) 90 MG TABS tablet Take 1 tablet (90 mg total) by mouth 2 (two) times daily.   Turmeric Curcumin 500 MG CAPS Take 500 mg by mouth daily.     Allergies:  Tetracyclines & related, Lisinopril, Losartan, and Lipitor [atorvastatin]   Social History   Socioeconomic History   Marital status: Divorced    Spouse name: Not on file   Number of children: 2   Years of education: Not on file   Highest education level: GED or equivalent  Occupational History   Occupation: Retired  Tobacco Use   Smoking status: Former    Packs/day: 1.00    Years: 40.00    Total pack years: 40.00    Types: Cigarettes    Quit date: 07/17/2008    Years since quitting: 13.3   Smokeless tobacco: Never  Substance and Sexual Activity   Alcohol use: No    Comment: recovering alcoholic, abstinence 30 years.  attends AA weekly   Drug use: No   Sexual activity: Never  Other Topics Concern   Not on file  Social History Narrative   Not on file   Social Determinants of Health   Financial Resource Strain: Not on file  Food Insecurity: Not on file  Transportation Needs: Not on file  Physical Activity: Not on file  Stress: Not on file  Social Connections: Not on file     Family History: The patient's family history includes Cancer in his mother.  ROS:   Please see the history of present illness.    + discomfort with swallowing medications All other systems reviewed and are negative.  Labs/Other Studies Reviewed:    The following studies were reviewed today:  LHC 11/16/21  Severe native left coronary artery diseas with 95% distal LMCA disease extending into LAD and LCx as well as CTO of proximal/mid LAD. Patent RCA stents with 30% in-stent restenosis in the mid/distal stent.  Proximal stent is widely patent. Widely patent LIMA-LAD. Patent SVG-OM with 50% ostial and 25% proximal in-stent restenosis.  There is a  95% stenosis at the proximal margin of the mid graft stent as well as a 40-50% lesion at the distal stent edge. Mildly elevated left ventricular filling pressure (LVEDP 18 mmHg) with normal systolic function (LVEF 55-65%). Probably moderate aortic valve stenosis (peak-to-peak gradient 20 mmHg). Successful PCI to 95% stenosis in mid portion of SVG-OM using Synergy 3.0 x 16 mm drug-eluting stent that overlaps the previously placed stent.  There is 0% residual stenosis with TIMI-3 flow following PCI.   Recommendations: Continue indefinite dual antiplatelet therapy with aspirin and ticagrelor.  One could consider stopping aspirin as soon as 3 months post-PCI if there is concern for high bleeding risk. Medical therapy of mild-moderate in-stent restenosis involving SVG-OM and mid/distal RCA. Aggressive secondary prevention of coronary artery disease. Anticipate same-day discharge if no post-PCI complications occur during 6 hour monitoring period  Diagnostic Dominance: Right  Intervention   Echo 04/02/20  LVEF 60-65%, no rwma, indeterminate diastolic parameters, normal RV Moderate calcification of aortic valve, trivial AI, mild to moderate AS (mean gradient 14 mmHg, peak gradient 25.2 mmHg)   Coronary atherectomy 04/04/20  Mid RCA lesion is 95% stenosed. A stent was successfully placed. Prox RCA lesion is 50% stenosed. Post intervention, there is a 0% residual stenosis. Prox RCA to Mid RCA lesion is 85% stenosed. Post intervention, there is a 0% residual stenosis. Post intervention, there is a 0% residual stenosis.   Difficult but ultimately successful complex coronary intervention to a significantly calcified proximal to mid RCA with stenoses of 50 to 80% with vessel angulation and 95% stenosis in the previously placed sandwich stents in the distal RCA.     The proximal  to mid stenoses was successfully treated with extensive CSI orbital atherectomy shockwave intravascular lithotripsy (3.0  x12 mm) and ultimate and stenting with a 3.0 x 34 mm Resolute DES stent postdilated to 3.25 mm with the entire region of stenoses being reduced to 0%.   The distal RCA in-stent restenosis from the 2015 in 2018 stents was successfully treated with noncompliant balloon dilatation, and shockwave intravascular lithotripsy (3.0 x 12) with the 95% stenosis being reduced to 0%.   Adjunctive use of intravascular ultrasound.   Temporary pacemaker inserted prior to atherectomy of a large RCA vessel with removal of pacemaker at the completion of the procedure.   RECOMMENDATION: Long term DAPT.  Optimal blood pressure control.  Aggressive lipid-lowering therapy with target LDL less than 70.  LHC 04/02/20   There is mild left ventricular systolic dysfunction. There is no aortic valve stenosis. There is no mitral valve regurgitation. Mid LM to Prox LAD lesion is 95% stenosed with 90% stenosed side branch in Ost Cx to Prox Cx. Prox LAD to Mid LAD lesion is 100% stenosed with 99% stenosed side branch in 2nd Diag. 2nd Mrg lesion is 90% stenosed. Prox RCA lesion is 80% stenosed. Mid RCA to Dist RCA lesion is 90% stenosed. LIMA graft was visualized by angiography and is very large. The graft exhibits no disease. ----- SVG graft was visualized by angiography and is large. Lesion #2: Origin lesion is 90% stenosed. A drug-eluting stent was successfully placed using a STENT RESOLUTE ONYX 3.0X15. Post intervention, there is a 0% residual stenosis. However the next lesion appeared to be more significant Lesion #3: Prox Graft lesion is 70% stenosed. A drug-eluting stent was successfully placed overlapping the ostial stent, using a STENT RESOLUTE ONYX 3.0X12. Postdilated to 3.3 mm Post intervention, there is a 0% residual stenosis Lesion #1: Mid Graft to Dist Graft lesion is 99% stenosed. A drug-eluting stent was successfully placed using a STENT RESOLUTE ONYX 3.0X22. Postdilated to 3.3 mm Post intervention,  there is a 0% residual stenosis.   SUMMARY SEVERE NATIVE VESSEL CAD: Persistent severe LEFT MAIN trifurcation 99% stenosis with occluded LAD, and small caliber ramus intermedius as well as OM1.   LCx gives off a major (grafted) OM 2 branch that now has actually antegrade flow beyond a 90% stenosis. Severe heavily calcified mid RCA 80% eccentric lesion proximal to stent over stent in the mid to distal vessel that has a focal area of 95 to 99% stenosis (appears to potentially be stent strut fracture -> that was noted following last PCI in 2018) Widely patent LIMA-LAD with diffuse small LAD downstream. SVG-OM has near ostial 90% stenosis followed by focal area of 70% (NOTED worse following stenting of the ostial segment), and then mid graft 99% lesion (culprit lesion) Borderline elevated LVEDP of roughly 16 mmHg - (no LV Gram)     RECOMMENDATIONS For now we will plan to monitor the patient post-cath and have discussed gust staged evaluation and PCI of the RCA.  This will likely require atherectomy of the more proximal-mid lesion, followed by IVUS of the in-stent restenosis segment in order to understand the pathology to properly treat (especially since this would be placing a third stent) Patient was reloaded on Brilinta. Continue risk factor modification.    LHC 06/08/16  LM lesion, 95 %stenosed. Prox RCA lesion, 20 %stenosed. Mid RCA lesion, 30 %stenosed. Prox LAD lesion, 100 %stenosed. LIMA. A STENT SYNERGY DES 2.5X16 drug eluting stent was successfully placed, and overlaps previously placed stent. Dist  RCA lesion, 95 %stenosed. Post intervention, there is a 0% residual stenosis.   Mild LV dysfunction with mild mid anterolateral hypocontractility and an ejection fraction of 40-45%.   Significant native CAD with 95% distal left main and ostial LAD stenosis and occlusion of the LAD after the proximal septal and diagonal vessel; a flush and fill phenomena seen in the left circumflex vessel  after the takeoff of a high marginal branch; and 20 and 30% proximal to mid RCA stenoses with 95% mid in-stent restenosis in the previously placed stent in the region of the acute margin.   Patent LIMA graft supplying the middle LAD.   Patent SVG supplying the circumflex marginal vessel   Successful PCI and DES stenting of the in-stent restenosis in the RCA with insertion of a 2.516 mm Synergy DES stent postdilated to 2.75 mm with the stenoses being reduced to 0%.   RECOMMENDATION: Continue dapt for minimum of a year and probably indefinitely.  Aggressive lipid-lowering therapy.  Medical therapy with ACE inhibitor/ARB therapy with LV dysfunction and a blocker therapy.    Recent Labs: 11/11/2021: BUN 20; Creatinine, Ser 1.17; Hemoglobin 14.5; Platelets 222; Potassium 4.7; Sodium 136  Recent Lipid Panel    Component Value Date/Time   CHOL 124 04/02/2020 0740   TRIG 85 04/02/2020 0740   HDL 33 (L) 04/02/2020 0740   CHOLHDL 3.8 04/02/2020 0740   VLDL 17 04/02/2020 0740   LDLCALC 74 04/02/2020 0740    Risk Assessment/Calculations:      Physical Exam:    VS:  BP 130/60   Pulse 69   Ht 5' 6.5" (1.689 m)   Wt 170 lb 12.8 oz (77.5 kg)   SpO2 97%   BMI 27.15 kg/m     Wt Readings from Last 3 Encounters:  11/24/21 170 lb 12.8 oz (77.5 kg)  11/16/21 168 lb (76.2 kg)  11/11/21 170 lb 6.4 oz (77.3 kg)     GEN:  Well nourished, well developed in no acute distress HEENT: Normal NECK: No JVD; No carotid bruits CARDIAC: RRR, 3/6 systolic murmur consistent with AS. No rubs, gallops RESPIRATORY:  Clear to auscultation without rales, wheezing or rhonchi  ABDOMEN: Soft, non-tender, non-distended MUSCULOSKELETAL:  No edema; No deformity. 2+ pedal pulses, equal bilaterally SKIN: Warm and dry. Left forearm with yellow/purplish bruising. Cath site is well approximated without swelling or tenderness. NEUROLOGIC:  Alert and oriented x 3 PSYCHIATRIC:  Normal affect   EKG:  EKG is ordered  today.  The ekg ordered today demonstrates NSR at 69 bpm, no ST/T wave abnormality  Diagnoses:    1. Coronary artery disease involving native coronary artery of native heart without angina pectoris   2. S/P angioplasty with stent   3. Hyperlipidemia LDL goal <70   4. Aortic valve stenosis, etiology of cardiac valve disease unspecified   5. Essential hypertension    Assessment and Plan:     CAD s/p PCI without angina: Presented with unstable angina and underwent LHC on 11/16/2021 with successful PCI/DES x 1 to SVG-OM overlapping previously placed stent. Medical therapy of mild-moderate ISR involving SVT-OM and mid/distal RCA. Continue ASA/Brilinta indefinitely ideally, can consider d/c ASA after 3 months if bleeding risk high. No bleeding concerns today. He denies chest pain, dyspnea, or other symptoms concerning for angina.  No indication for further ischemic evaluation.  Continue GDMT including Brilinta, aspirin, rosuvastatin, metoprolol, empagliflozin. Will let us know if he will continue to be followed here or only at [place].   Aortic  stenosis: Mild aortic stenosis by gradients but suspicion for moderate AAS with low SVC and [name] and DI per Dr. Bjorn Pippin on echo 03/2020. 3/6 murmur consistent with AS auscultated bilateral upper sternal borders today. Recommended repeat echocardiogram. He would like to wait to talk to providers at the [facility].   Hypertension: BP is well controlled today.  No medication changes today.  Hyperlipidemia LDL goal < 70: LDL 74 on 03/2020. Will recheck today along with LFTs. Continue rosuvastatin.   Disposition: Keep your October appointment with Dr. Eldridge Dace   Medication Adjustments/Labs and Tests Ordered: Current medicines are reviewed at length with the patient today.  Concerns regarding medicines are outlined above.  Orders Placed This Encounter  Procedures   Lipid Profile   Hepatic function panel   EKG 12-Lead   No orders of the defined types were placed in this  encounter.   Patient Instructions  Medication Instructions:   Your physician recommends that you continue on your current medications as directed. Please refer to the Current Medication list given to you today.  *If you need a refill on your cardiac medications before your next appointment, please call your pharmacy*   Lab Work:  TODAY!!!!  LIPID/LFT  If you have labs (blood work) drawn today and your tests are completely normal, you will receive your results only by: MyChart Message (if you have MyChart) OR A paper copy in the mail If you have any lab test that is abnormal or we need to change your treatment, we will call you to review the results.   Testing/Procedures:  None ordered.   Follow-Up: At [facility], you and your health needs are our priority.  As part of our continuing mission to provide you with exceptional heart care, we have created designated Provider Care Teams.  These Care Teams include your primary Cardiologist (physician) and Advanced Practice Providers (APPs -  Physician Assistants and Nurse Practitioners) who all work together to provide you with the care you need, when you need it.  We recommend signing up for the patient portal called "MyChart".  Sign up information is provided on this After Visit Summary.  MyChart is used to connect with patients for Virtual Visits (Telemedicine).  Patients are able to view lab/test results, encounter notes, upcoming appointments, etc.  Non-urgent messages can be sent to your provider as well.   To learn more about what you can do with MyChart, go to [email or web address].    Your next appointment:   2 month(s)  The format for your next appointment:   In Person  Provider:   Lance Muss, MD     Other Instructions   Important Information About Sugar         Signed, Levi Aland, NP  11/24/2021 4:46 PM    [facility]

## 2021-11-24 ENCOUNTER — Encounter: Payer: Self-pay | Admitting: Nurse Practitioner

## 2021-11-24 ENCOUNTER — Ambulatory Visit (INDEPENDENT_AMBULATORY_CARE_PROVIDER_SITE_OTHER): Payer: No Typology Code available for payment source | Admitting: Nurse Practitioner

## 2021-11-24 VITALS — BP 130/60 | HR 69 | Ht 66.5 in | Wt 170.8 lb

## 2021-11-24 DIAGNOSIS — Z95820 Peripheral vascular angioplasty status with implants and grafts: Secondary | ICD-10-CM | POA: Diagnosis not present

## 2021-11-24 DIAGNOSIS — I2510 Atherosclerotic heart disease of native coronary artery without angina pectoris: Secondary | ICD-10-CM | POA: Diagnosis not present

## 2021-11-24 DIAGNOSIS — I350 Nonrheumatic aortic (valve) stenosis: Secondary | ICD-10-CM

## 2021-11-24 DIAGNOSIS — E785 Hyperlipidemia, unspecified: Secondary | ICD-10-CM | POA: Diagnosis not present

## 2021-11-24 DIAGNOSIS — I10 Essential (primary) hypertension: Secondary | ICD-10-CM

## 2021-11-24 LAB — HEPATIC FUNCTION PANEL
ALT: 12 IU/L (ref 0–44)
AST: 21 IU/L (ref 0–40)
Albumin: 4.4 g/dL (ref 3.8–4.8)
Alkaline Phosphatase: 91 IU/L (ref 44–121)
Bilirubin Total: 0.4 mg/dL (ref 0.0–1.2)
Bilirubin, Direct: 0.12 mg/dL (ref 0.00–0.40)
Total Protein: 6.9 g/dL (ref 6.0–8.5)

## 2021-11-24 LAB — LIPID PANEL
Chol/HDL Ratio: 3.4 ratio (ref 0.0–5.0)
Cholesterol, Total: 134 mg/dL (ref 100–199)
HDL: 40 mg/dL (ref >39)
LDL Chol Calc (NIH): 72 mg/dL (ref 0–99)
Triglycerides: 123 mg/dL (ref 0–149)
VLDL Cholesterol Cal: 22 mg/dL (ref 5–40)

## 2021-11-24 NOTE — Patient Instructions (Signed)
Medication Instructions:   Your physician recommends that you continue on your current medications as directed. Please refer to the Current Medication list given to you today.  *If you need a refill on your cardiac medications before your next appointment, please call your pharmacy*   Lab Work:  TODAY!!!!  LIPID/LFT  If you have labs (blood work) drawn today and your tests are completely normal, you will receive your results only by: MyChart Message (if you have MyChart) OR A paper copy in the mail If you have any lab test that is abnormal or we need to change your treatment, we will call you to review the results.   Testing/Procedures:  None ordered.   Follow-Up: At [facility], you and your health needs are our priority.  As part of our continuing mission to provide you with exceptional heart care, we have created designated Provider Care Teams.  These Care Teams include your primary Cardiologist (physician) and Advanced Practice Providers (APPs -  Physician Assistants and Nurse Practitioners) who all work together to provide you with the care you need, when you need it.  We recommend signing up for the patient portal called "MyChart".  Sign up information is provided on this After Visit Summary.  MyChart is used to connect with patients for Virtual Visits (Telemedicine).  Patients are able to view lab/test results, encounter notes, upcoming appointments, etc.  Non-urgent messages can be sent to your provider as well.   To learn more about what you can do with MyChart, go to [email or web address].    Your next appointment:   2 month(s)  The format for your next appointment:   In Person  Provider:   Lance Muss, MD     Other Instructions   Important Information About Sugar

## 2021-12-24 ENCOUNTER — Telehealth: Payer: Self-pay | Admitting: *Deleted

## 2021-12-24 NOTE — Patient Outreach (Signed)
  Care Coordination   12/24/2021 Name: Luke Harvey MRN: [id number] DOB: [birth year]   Care Coordination Outreach Attempts:  An unsuccessful telephone outreach was attempted today to offer the patient information about available care coordination services as a benefit of their health plan.   Follow Up Plan:  Additional outreach attempts will be made to offer the patient care coordination information and services.   Encounter Outcome:  No Answer  Care Coordination Interventions Activated:  No   Care Coordination Interventions:  No, not indicated    [name] [facility], BSN [facility] RN Care Coordinator [phone]

## 2021-12-24 NOTE — Patient Outreach (Signed)
  Care Coordination   12/24/2021 Name: Luke Harvey MRN: [id number] DOB: December 01, 1945   Care Coordination Outreach Attempts:  A second unsuccessful outreach was attempted today to offer the patient with information about available care coordination services as a benefit of their health plan.    Patient called RN care coordinator back and left voicemail, RN care coordinator attempted call back with no answer.  Follow Up Plan:  Additional outreach attempts will be made to offer the patient care coordination information and services.   Encounter Outcome:  No Answer  Care Coordination Interventions Activated:  No   Care Coordination Interventions:  No, not indicated    [name] [facility] BSN [facility] RN Care Coordinator [phone]

## 2021-12-25 ENCOUNTER — Telehealth: Payer: Self-pay | Admitting: *Deleted

## 2021-12-25 NOTE — Patient Outreach (Signed)
  Care Coordination   12/25/2021 Name: Luke Harvey MRN: [id number] DOB: 01-Mar-1946   Care Coordination Outreach Attempts:  A third unsuccessful outreach was attempted today to offer the patient with information about available care coordination services as a benefit of their health plan.   Follow Up Plan:  No further outreach attempts will be made at this time. We have been unable to contact the patient to offer or enroll patient in care coordination services  Encounter Outcome:  No Answer  Care Coordination Interventions Activated:  No   Care Coordination Interventions:  No, not indicated    [name] [facility], BSN [facility] RN Care Coordinator [phone]

## 2021-12-29 ENCOUNTER — Telehealth: Payer: Self-pay | Admitting: *Deleted

## 2021-12-29 ENCOUNTER — Encounter: Payer: Self-pay | Admitting: *Deleted

## 2021-12-29 NOTE — Patient Outreach (Signed)
  Care Coordination   Initial Visit Note   12/29/2021 Name: Luke Harvey MRN: [id number] DOB: 02/13/1946  Luke Harvey is a 76 y.o. year old male who sees [facility] for primary care. I spoke with  Luke Harvey by phone today.  What matters to the patients health and wellness today?  "I'll check and see if I've had my Annual Wellness Visit"    Goals Addressed               This Visit's Progress     "I'll check and see if I"ve had my Annual Wellness Visit" (pt-stated)        Care Coordination Interventions: Patient interviewed about adult health maintenance status including  importance of yearly Annual Wellness Visit Provided education about continuing to follow up with primary care provider as scheduled q 6 months, taking medications as prescribed. Care coordination program explained to patient who is agreeable to today's outreach, declines any future outreach. Patient reports he is managing his health well at present, continues to drive and sees primary care provider at [facility] in [place].         SDOH assessments and interventions completed:  Yes  SDOH Interventions Today    Flowsheet Row Most Recent Value  SDOH Interventions   Food Insecurity Interventions Intervention Not Indicated  Transportation Interventions Intervention Not Indicated  [pt reports he still drives]        Care Coordination Interventions Activated:  Yes  Care Coordination Interventions:  Yes, provided   Follow up plan: No further intervention required.   Encounter Outcome:  Pt. Visit Completed   Luke Harvey [facility], BSN [facility] RN Care Coordinator [phone]
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
## 2022-02-09 ENCOUNTER — Encounter: Payer: Self-pay | Admitting: Interventional Cardiology

## 2022-02-09 ENCOUNTER — Ambulatory Visit: Payer: No Typology Code available for payment source | Admitting: Physician Assistant

## 2022-02-09 ENCOUNTER — Ambulatory Visit: Payer: Medicare Other | Attending: Physician Assistant | Admitting: Interventional Cardiology

## 2022-02-09 VITALS — BP 130/70 | HR 68 | Ht 66.5 in | Wt 170.0 lb

## 2022-02-09 DIAGNOSIS — I350 Nonrheumatic aortic (valve) stenosis: Secondary | ICD-10-CM

## 2022-02-09 DIAGNOSIS — I10 Essential (primary) hypertension: Secondary | ICD-10-CM

## 2022-02-09 DIAGNOSIS — Z95820 Peripheral vascular angioplasty status with implants and grafts: Secondary | ICD-10-CM

## 2022-02-09 DIAGNOSIS — E785 Hyperlipidemia, unspecified: Secondary | ICD-10-CM | POA: Diagnosis not present

## 2022-02-09 DIAGNOSIS — I2510 Atherosclerotic heart disease of native coronary artery without angina pectoris: Secondary | ICD-10-CM

## 2022-02-09 NOTE — Patient Instructions (Signed)
Medication Instructions:  Your physician recommends that you continue on your current medications as directed. Please refer to the Current Medication list given to you today.  *If you need a refill on your cardiac medications before your next appointment, please call your pharmacy*   Lab Work: none If you have labs (blood work) drawn today and your tests are completely normal, you will receive your results only by: MyChart Message (if you have MyChart) OR A paper copy in the mail If you have any lab test that is abnormal or we need to change your treatment, we will call you to review the results.   Testing/Procedures: none   Follow-Up: At [facility], you and your health needs are our priority.  As part of our continuing mission to provide you with exceptional heart care, we have created designated Provider Care Teams.  These Care Teams include your primary Cardiologist (physician) and Advanced Practice Providers (APPs -  Physician Assistants and Nurse Practitioners) who all work together to provide you with the care you need, when you need it.  We recommend signing up for the patient portal called "MyChart".  Sign up information is provided on this After Visit Summary.  MyChart is used to connect with patients for Virtual Visits (Telemedicine).  Patients are able to view lab/test results, encounter notes, upcoming appointments, etc.  Non-urgent messages can be sent to your provider as well.   To learn more about what you can do with MyChart, go to [email or web address].    Your next appointment:   12 month(s)  The format for your next appointment:   In Person  Provider:   Jayadeep Varanasi, MD     Other Instructions   High-Fiber Eating Plan Fiber, also called dietary fiber, is a type of carbohydrate. It is found foods such as fruits, vegetables, whole grains, and beans. A high-fiber diet can have many health benefits. Your health care provider may recommend a  high-fiber diet to help: Prevent constipation. Fiber can make your bowel movements more regular. Lower your cholesterol. Relieve the following conditions: Inflammation of veins in the anus (hemorrhoids). Inflammation of specific areas of the digestive tract (uncomplicated diverticulosis). A problem of the large intestine, also called the colon, that sometimes causes pain and diarrhea (irritable bowel syndrome, or IBS). Prevent overeating as part of a weight-loss plan. Prevent heart disease, type 2 diabetes, and certain cancers. What are tips for following this plan? Reading food labels  Check the nutrition facts label on food products for the amount of dietary fiber. Choose foods that have 5 grams of fiber or more per serving. The goals for recommended daily fiber intake include: Men (age 50 or younger): 34-38 g. Men (over age 50): 28-34 g. Women (age 50 or younger): 25-28 g. Women (over age 50): 22-25 g. Your daily fiber goal is _____________ g. Shopping Choose whole fruits and vegetables instead of processed forms, such as apple juice or applesauce. Choose a wide variety of high-fiber foods such as avocados, lentils, oats, and kidney beans. Read the nutrition facts label of the foods you choose. Be aware of foods with added fiber. These foods often have high sugar and sodium amounts per serving. Cooking Use whole-grain flour for baking and cooking. Cook with brown rice instead of white rice. Meal planning Start the day with a breakfast that is high in fiber, such as a cereal that contains 5 g of fiber or more per serving. Eat breads and cereals that are made with whole-grain flour   instead of refined flour or white flour. Eat brown rice, bulgur wheat, or millet instead of white rice. Use beans in place of meat in soups, salads, and pasta dishes. Be sure that half of the grains you eat each day are whole grains. General information You can get the recommended daily intake of dietary  fiber by: Eating a variety of fruits, vegetables, grains, nuts, and beans. Taking a fiber supplement if you are not able to take in enough fiber in your diet. It is better to get fiber through food than from a supplement. Gradually increase how much fiber you consume. If you increase your intake of dietary fiber too quickly, you may have bloating, cramping, or gas. Drink plenty of water to help you digest fiber. Choose high-fiber snacks, such as berries, raw vegetables, nuts, and popcorn. What foods should I eat? Fruits Berries. Pears. Apples. Oranges. Avocado. Prunes and raisins. Dried figs. Vegetables Sweet potatoes. Spinach. Kale. Artichokes. Cabbage. Broccoli. Cauliflower. Green peas. Carrots. Squash. Grains Whole-grain breads. Multigrain cereal. Oats and oatmeal. Brown rice. Barley. Bulgur wheat. Millet. Quinoa. Bran muffins. Popcorn. Rye wafer crackers. Meats and other proteins Navy beans, kidney beans, and pinto beans. Soybeans. Split peas. Lentils. Nuts and seeds. Dairy Fiber-fortified yogurt. Beverages Fiber-fortified soy milk. Fiber-fortified orange juice. Other foods Fiber bars. The items listed above may not be a complete list of recommended foods and beverages. Contact a dietitian for more information. What foods should I avoid? Fruits Fruit juice. Cooked, strained fruit. Vegetables Fried potatoes. Canned vegetables. Well-cooked vegetables. Grains White bread. Pasta made with refined flour. White rice. Meats and other proteins Fatty cuts of meat. Fried chicken or fried fish. Dairy Milk. Yogurt. Cream cheese. Sour cream. Fats and oils Butters. Beverages Soft drinks. Other foods Cakes and pastries. The items listed above may not be a complete list of foods and beverages to avoid. Talk with your dietitian about what choices are best for you. Summary Fiber is a type of carbohydrate. It is found in foods such as fruits, vegetables, whole grains, and beans. A  high-fiber diet has many benefits. It can help to prevent constipation, lower blood cholesterol, aid weight loss, and reduce your risk of heart disease, diabetes, and certain cancers. Increase your intake of fiber gradually. Increasing fiber too quickly may cause cramping, bloating, and gas. Drink plenty of water while you increase the amount of fiber you consume. The best sources of fiber include whole fruits and vegetables, whole grains, nuts, seeds, and beans. This information is not intended to replace advice given to you by your health care provider. Make sure you discuss any questions you have with your health care provider. Document Revised: 08/16/2019 Document Reviewed: 08/16/2019 Elsevier Patient Education  2023 [employer]  Important Information About Sugar       

## 2022-03-02 ENCOUNTER — Telehealth: Payer: Self-pay | Admitting: Interventional Cardiology

## 2022-03-02 NOTE — Telephone Encounter (Signed)
-----   Message from Jettie Booze, MD sent at 03/01/2022  9:47 AM EST ----- A1C mildly elevated at 7.6. Continue to try to get this under 7 with healthy diet and regular exercise.

## 2022-03-02 NOTE — Telephone Encounter (Signed)
Pt returning call for lab results  

## 2022-03-02 NOTE — Telephone Encounter (Signed)
Pt also wanted to inform Dr. Irish Lack that he had an echo done recently.   Pt states he is out running errands so if he does not answer to please LVM.

## 2022-03-02 NOTE — Telephone Encounter (Signed)
Patient notified.  [facility] will [facility] report to Dr Irish Lack

## 2022-03-29 ENCOUNTER — Emergency Department (HOSPITAL_COMMUNITY): Payer: No Typology Code available for payment source

## 2022-03-29 ENCOUNTER — Encounter (HOSPITAL_COMMUNITY): Payer: Self-pay

## 2022-03-29 ENCOUNTER — Other Ambulatory Visit: Payer: Self-pay

## 2022-03-29 ENCOUNTER — Emergency Department (HOSPITAL_COMMUNITY)
Admission: EM | Admit: 2022-03-29 | Discharge: 2022-03-30 | Disposition: A | Discharge status: Home / Self Care | Payer: No Typology Code available for payment source | Attending: Emergency Medicine | Admitting: Emergency Medicine

## 2022-03-29 DIAGNOSIS — N3000 Acute cystitis without hematuria: Secondary | ICD-10-CM | POA: Diagnosis not present

## 2022-03-29 DIAGNOSIS — U071 COVID-19: Secondary | ICD-10-CM | POA: Diagnosis not present

## 2022-03-29 DIAGNOSIS — Z7984 Long term (current) use of oral hypoglycemic drugs: Secondary | ICD-10-CM | POA: Diagnosis not present

## 2022-03-29 DIAGNOSIS — Z7982 Long term (current) use of aspirin: Secondary | ICD-10-CM | POA: Diagnosis not present

## 2022-03-29 DIAGNOSIS — I10 Essential (primary) hypertension: Secondary | ICD-10-CM | POA: Insufficient documentation

## 2022-03-29 DIAGNOSIS — I2510 Atherosclerotic heart disease of native coronary artery without angina pectoris: Secondary | ICD-10-CM | POA: Diagnosis not present

## 2022-03-29 DIAGNOSIS — M549 Dorsalgia, unspecified: Secondary | ICD-10-CM | POA: Diagnosis not present

## 2022-03-29 DIAGNOSIS — E1140 Type 2 diabetes mellitus with diabetic neuropathy, unspecified: Secondary | ICD-10-CM | POA: Insufficient documentation

## 2022-03-29 DIAGNOSIS — Z79899 Other long term (current) drug therapy: Secondary | ICD-10-CM | POA: Insufficient documentation

## 2022-03-29 DIAGNOSIS — Z8673 Personal history of transient ischemic attack (TIA), and cerebral infarction without residual deficits: Secondary | ICD-10-CM | POA: Diagnosis not present

## 2022-03-29 DIAGNOSIS — Z951 Presence of aortocoronary bypass graft: Secondary | ICD-10-CM | POA: Diagnosis not present

## 2022-03-29 DIAGNOSIS — R42 Dizziness and giddiness: Secondary | ICD-10-CM | POA: Insufficient documentation

## 2022-03-29 DIAGNOSIS — R059 Cough, unspecified: Secondary | ICD-10-CM | POA: Diagnosis present

## 2022-03-29 LAB — URINALYSIS, ROUTINE W REFLEX MICROSCOPIC
Bilirubin Urine: NEGATIVE
Glucose, UA: >=500 mg/dL — AB
Hgb urine dipstick: NEGATIVE
Ketones, ur: NEGATIVE mg/dL
Leukocytes,Ua: NEGATIVE
Nitrite: NEGATIVE
Protein, ur: NEGATIVE mg/dL
Specific Gravity, Urine: 1.03 (ref 1.005–1.030)
pH: 5 (ref 5.0–8.0)

## 2022-03-29 LAB — COMPREHENSIVE METABOLIC PANEL
ALT: 24 U/L (ref 0–44)
AST: 29 U/L (ref 15–41)
Albumin: 3.7 g/dL (ref 3.5–5.0)
Alkaline Phosphatase: 65 U/L (ref 38–126)
Anion gap: 9 (ref 5–15)
BUN: 21 mg/dL (ref 8–23)
CO2: 23 mmol/L (ref 22–32)
Calcium: 9 mg/dL (ref 8.9–10.3)
Chloride: 103 mmol/L (ref 98–111)
Creatinine, Ser: 1.39 mg/dL — ABNORMAL HIGH (ref 0.61–1.24)
GFR, Estimated: 52 mL/min — ABNORMAL LOW (ref >60)
Glucose, Bld: 151 mg/dL — ABNORMAL HIGH (ref 70–99)
Potassium: 4.5 mmol/L (ref 3.5–5.1)
Sodium: 135 mmol/L (ref 135–145)
Total Bilirubin: <0.1 mg/dL — ABNORMAL LOW (ref 0.3–1.2)
Total Protein: 6.6 g/dL (ref 6.5–8.1)

## 2022-03-29 LAB — CBC WITH DIFFERENTIAL/PLATELET
Abs Immature Granulocytes: 0.03 10*3/uL (ref 0.00–0.07)
Basophils Absolute: 0 10*3/uL (ref 0.0–0.1)
Basophils Relative: 1 %
Eosinophils Absolute: 0.3 10*3/uL (ref 0.0–0.5)
Eosinophils Relative: 3 %
HCT: 39.3 % (ref 39.0–52.0)
Hemoglobin: 13 g/dL (ref 13.0–17.0)
Immature Granulocytes: 0 %
Lymphocytes Relative: 25 %
Lymphs Abs: 2.1 10*3/uL (ref 0.7–4.0)
MCH: 29.2 pg (ref 26.0–34.0)
MCHC: 33.1 g/dL (ref 30.0–36.0)
MCV: 88.3 fL (ref 80.0–100.0)
Monocytes Absolute: 0.8 10*3/uL (ref 0.1–1.0)
Monocytes Relative: 9 %
Neutro Abs: 5.1 10*3/uL (ref 1.7–7.7)
Neutrophils Relative %: 62 %
Platelets: 303 10*3/uL (ref 150–400)
RBC: 4.45 MIL/uL (ref 4.22–5.81)
RDW: 12.1 % (ref 11.5–15.5)
WBC: 8.3 10*3/uL (ref 4.0–10.5)
nRBC: 0 % (ref 0.0–0.2)

## 2022-03-29 LAB — RESP PANEL BY RT-PCR (FLU A&B, COVID) ARPGX2
Influenza A by PCR: NEGATIVE
Influenza B by PCR: NEGATIVE
SARS Coronavirus 2 by RT PCR: POSITIVE — AB

## 2022-03-29 LAB — CBG MONITORING, ED: Glucose-Capillary: 154 mg/dL — ABNORMAL HIGH (ref 70–99)

## 2022-03-29 NOTE — ED Triage Notes (Signed)
Pt reports he was sent from Urgent Care in [place] for abnormal chest xray and creatinine and reports he was told they are also concerned for "lactic acidosis" and they told him he needs a chest CT. He does not recall what was abnormal with his xray or how high his creatinine was but they told him it was elevated. He reports bilateral flank pain.

## 2022-03-29 NOTE — ED Provider Triage Note (Signed)
Emergency Medicine Provider Triage Evaluation Note  Luke Harvey , a 76 y.o. male  was evaluated in triage.  Pt complains of sent from [facility] urgent care for abnormal chest x-ray, concern over renal function.  Patient states that he has been feeling generally unwell since the Wednesday before Thanksgiving.  He describes having decreased appetite, urinary hesitancy and cough.  He denies having known fevers or shortness of breath.  He denies abdominal pain, nausea, vomiting or diarrhea..  Review of Systems  Positive: See above Negative:   Physical Exam  BP (!) 162/60 (BP Location: Left Arm)   Pulse 92   Temp 98 F (36.7 C) (Oral)   Resp 16   Ht 5' 6.5" (1.689 m)   Wt 76.2 kg   SpO2 98%   BMI 26.71 kg/m  Gen:   Awake, no distress   Resp:  Normal effort  MSK:   Moves extremities without difficulty  Other:    Medical Decision Making  Medically screening exam initiated at 8:10 PM.  Appropriate orders placed.  Luke Harvey was informed that the remainder of the evaluation will be completed by another provider, this initial triage assessment does not replace that evaluation, and the importance of remaining in the ED until their evaluation is complete.     Cristopher Peru, PA-C 03/29/22 2011

## 2022-03-30 ENCOUNTER — Emergency Department (HOSPITAL_COMMUNITY): Payer: No Typology Code available for payment source

## 2022-03-30 MED ORDER — HYDROCODONE-ACETAMINOPHEN 5-325 MG PO TABS: 1.0000 | ORAL_TABLET | Freq: Once | ORAL | Status: DC

## 2022-03-30 MED ORDER — ONDANSETRON HCL 4 MG/2ML IJ SOLN
4.0000 mg | Freq: Once | INTRAMUSCULAR | Status: AC
  Administered 2022-03-30: 4 mg via INTRAVENOUS
  Filled 2022-03-30: qty 2

## 2022-03-30 MED ORDER — SODIUM CHLORIDE 0.9 % IV SOLN
1.0000 g | Freq: Once | INTRAVENOUS | Status: AC
  Administered 2022-03-30: 1 g via INTRAVENOUS
  Filled 2022-03-30: qty 10

## 2022-03-30 MED ORDER — SODIUM CHLORIDE 0.9 % IV BOLUS
1000.0000 mL | Freq: Once | INTRAVENOUS | Status: AC
  Administered 2022-03-30: 1000 mL via INTRAVENOUS

## 2022-03-30 MED ORDER — CEPHALEXIN 500 MG PO CAPS: 500.0000 mg | ORAL_CAPSULE | Freq: Three times a day (TID) | ORAL | 0 refills | Status: DC

## 2022-03-30 NOTE — ED Notes (Signed)
Patient transported to CT 

## 2022-03-30 NOTE — Discharge Instructions (Addendum)
You were seen today for not feeling well.  You tested positive for COVID-19.  You also may have a urinary tract infection.  Take antibiotics as prescribed.  You will need a repeat x-ray in 2 to 4 weeks.  Your chest CT indicates you may have developed interstitial lung disease.  Given that you have had symptoms for greater than 1 week, you are not a candidate for the antiviral Paxlovid.  Follow-up closely with your primary physician.

## 2022-03-30 NOTE — ED Notes (Signed)
Discharge instructions discussed with pt. Verbalized understanding. VSS. No questions or concerns regarding discharge  

## 2022-03-30 NOTE — ED Provider Notes (Signed)
[facility] Provider Note   CSN: [id number] Arrival date & time: 03/29/22  1815     History  Chief Complaint  Patient presents with   Abnormal Lab    Luke Harvey is a 76 y.o. male.  HPI     This is a 76 year old male with a history of coronary artery disease, hypertension, who presents with not feeling well.  Patient presented originally to urgent care at the [facility].  He states he has felt generally weak.  He has had decreased p.o. intake.  He reports urinary hesitancy and back pain.  No fevers but he does report chills.  He has also had some cough.  Symptoms began the Wednesday before Thanksgiving.  Patient also states that he had a significant episode several days ago at home where he felt "acutely drunk."  He is a recovering alcoholic and states that he has not had anything to drink in over 20 years.  However, he had difficulty ambulating and the room was spinning.  He has not noted any weakness, numbness, tingling, other strokelike symptoms.  He is on Brilinta.  He reportedly had a chest x-ray done at the urgent care which she states was abnormal and "they were concerned about my kidney function."  No known sick contacts.  He did have his primary series of COVID vaccinations.  Urgent care notes reviewed.  Chest x-ray read with opacity of unclear etiology. Home Medications Prior to Admission medications   Medication Sig Start Date End Date Taking? Authorizing Provider  cephALEXin (KEFLEX) 500 MG capsule Take 1 capsule (500 mg total) by mouth 3 (three) times daily. 03/30/22  Yes Zayden Hahne, Mayer Maskerourtney F, MD  acetaminophen (TYLENOL) 325 MG tablet Take 2 tablets (650 mg total) by mouth every 4 (four) hours as needed for headache or mild pain. 06/09/16   Leone BrandIngold, Laura R, NP  Alpha-Lipoic Acid 200 MG CAPS Take 200 mg by mouth daily.    [provider]  APPLE CIDER VINEGAR PO Take 450 mg by mouth daily.    [provider]  aspirin EC 81 MG  tablet Take 81 mg by mouth daily.    [provider]  Bacillus Coagulans-Inulin (PROBIOTIC-PREBIOTIC PO) Take 1 tablet by mouth 3 (three) times daily. Bio Complete 3    [provider]  cholecalciferol (VITAMIN D) 25 MCG (1000 UNIT) tablet Take 1,000 Units by mouth daily.    [provider]  citalopram (CELEXA) 40 MG tablet Take 20 mg by mouth daily.     [provider]  Coenzyme Q10 200 MG capsule Take 200 mg by mouth daily.    [provider]  Cyanocobalamin (VITAMIN B12 PO) Take 3,000 mcg by mouth daily.    [provider]  diclofenac Sodium (VOLTAREN) 1 % GEL as needed for pain. 02/02/22   [provider]  doxycycline (MONODOX) 50 MG capsule Take 50 mg by mouth daily.    [provider]  empagliflozin (JARDIANCE) 25 MG TABS tablet Take 25 mg by mouth daily.    [provider]  fluocinonide cream (LIDEX) 0.05 % as needed for irritation. 05/04/06   [provider]  furosemide (LASIX) 20 MG tablet Take 20 mg by mouth as needed (leg swelling).    [provider]  Ivermectin 1 % CREA as needed for irritation. 02/23/21   [provider]  ketoconazole (NIZORAL) 2 % cream daily. 02/23/21   [provider]  levOCARNitine (L-CARNITINE PO) Take 1 tablet  by mouth daily.    [provider]  levothyroxine (SYNTHROID) 75 MCG tablet daily. 02/02/22   [provider]  magnesium oxide (MAG-OX) 400 (240 Mg) MG tablet Take 400 mg by mouth daily.    [provider]  metFORMIN (GLUCOPHAGE) 500 MG tablet Take 1 tablet by mouth 2 (two) times daily. 05/04/06   [provider]  metoprolol (LOPRESSOR) 50 MG tablet Take 25 mg by mouth 2 (two) times daily.     [provider]  milk thistle 175 MG tablet Take 175 mg by mouth daily.    [provider]  Multiple Vitamins-Minerals (HAIR/SKIN/NAILS) CAPS Take 5,000 mcg by mouth daily.    [provider]   Multiple Vitamins-Minerals (PRESERVISION AREDS PO) Take 1 tablet by mouth 2 (two) times daily.    [provider]  nitroGLYCERIN (NITROSTAT) 0.4 MG SL tablet Place 0.4 mg under the tongue every 5 (five) minutes as needed for chest pain.    [provider]  OVER THE COUNTER MEDICATION Take 1 capsule by mouth 3 (three) times daily. VisiUltra    [provider]  OVER THE COUNTER MEDICATION Take 1 Capful by mouth 3 (three) times daily. Juvenon    [provider]  OVER THE COUNTER MEDICATION Take 1 Capful by mouth 2 (two) times daily. Nerve Control 911    [provider]  OVER THE COUNTER MEDICATION Take 1 capsule by mouth daily. Blood bo0st formulia    [provider]  OVER THE COUNTER MEDICATION Take 1 capsule by mouth daily. Glucofort    [provider]  OVER THE COUNTER MEDICATION Take 3 capsules by mouth daily. Balance of nature  Fruits and    [provider]  OVER THE COUNTER MEDICATION Take 3 capsules by mouth daily. Balance of [employer], Historical, MD  OVER THE COUNTER MEDICATION Take 1 Scoop by mouth daily. patriot power greens mix with water    [provider]  OVER THE COUNTER MEDICATION Take 0.5 Scoops by mouth daily. Vitalreds Fruit mix with water    [provider]  polycarbophil (FIBERCON) 625 MG tablet Take 625 mg by mouth daily.    [provider]  Quercetin 500 MG CAPS Take 500 mg by mouth daily.    [provider]  rosuvastatin (CRESTOR) 40 MG tablet Take 1 tablet (40 mg total) by mouth daily. 04/05/20   Dunn, Dayna N, PA-C  Saw Palmetto 450 MG CAPS Take 450 mg by mouth daily.    [provider]  selenium (SELENIMIN-200) 200 MCG TABS tablet Take 200 mcg by mouth daily.    [provider]  TART CHERRY PO Take 1,200 mg by mouth daily. Extract    [provider]  ticagrelor (BRILINTA) 90 MG TABS tablet Take 1 tablet (90 mg total)  by mouth 2 (two) times daily. 04/05/20   Dunn, Tacey Ruiz, PA-C  Turmeric Curcumin 500 MG CAPS Take 500 mg by mouth daily.    [provider]      Allergies    Tetracyclines & related, Lisinopril, Losartan, and Lipitor [atorvastatin]    Review of Systems   Review of Systems  Constitutional:  Negative for fever.  Respiratory:  Positive for cough.   Gastrointestinal:  Positive for nausea and vomiting.  Neurological:  Positive for dizziness.  All other systems reviewed and are negative.   Physical Exam Updated Vital Signs BP 137/65   Pulse 74   Temp 98.3 F (  36.8 C) (Oral)   Resp 19   Ht 1.689 m (5' 6.5")   Wt 76.2 kg   SpO2 95%   BMI 26.71 kg/m  Physical Exam Vitals and nursing note reviewed.  Constitutional:      Appearance: He is well-developed. He is not ill-appearing.  HENT:     Head: Normocephalic and atraumatic.     Mouth/Throat:     Mouth: Mucous membranes are dry.  Eyes:     Pupils: Pupils are equal, round, and reactive to light.  Cardiovascular:     Rate and Rhythm: Normal rate and regular rhythm.     Heart sounds: Normal heart sounds. No murmur heard. Pulmonary:     Effort: Pulmonary effort is normal. No respiratory distress.     Breath sounds: Normal breath sounds. No wheezing.  Abdominal:     Palpations: Abdomen is soft.     Tenderness: There is no abdominal tenderness. There is no rebound.  Musculoskeletal:     Cervical back: Neck supple.  Lymphadenopathy:     Cervical: No cervical adenopathy.  Skin:    General: Skin is warm and dry.  Neurological:     Mental Status: He is alert and oriented to person, place, and time.  Psychiatric:        Mood and Affect: Mood normal.     ED Results / Procedures / Treatments   Labs (all labs ordered are listed, but only abnormal results are displayed) Labs Reviewed  RESP PANEL BY RT-PCR (FLU A&B, COVID) ARPGX2 - Abnormal; Notable for the following components:      Result Value   SARS Coronavirus 2 by  RT PCR POSITIVE (*)    All other components within normal limits  COMPREHENSIVE METABOLIC PANEL - Abnormal; Notable for the following components:   Glucose, Bld 151 (*)    Creatinine, Ser 1.39 (*)    Total Bilirubin <0.1 (*)    GFR, Estimated 52 (*)    All other components within normal limits  URINALYSIS, ROUTINE W REFLEX MICROSCOPIC - Abnormal; Notable for the following components:   APPearance HAZY (*)    Glucose, UA >=500 (*)    Bacteria, UA MANY (*)    All other components within normal limits  CBG MONITORING, ED - Abnormal; Notable for the following components:   Glucose-Capillary 154 (*)    All other components within normal limits  CBC WITH DIFFERENTIAL/PLATELET    EKG None  Radiology CT Chest Wo Contrast  Result Date: 03/30/2022 CLINICAL DATA:  76 year old male with dizziness, vertigo. Respiratory illness with right lung base opacity on radiographs yesterday. Cough. EXAM: CT CHEST WITHOUT CONTRAST TECHNIQUE: Multidetector CT imaging of the chest was performed following the standard protocol without IV contrast. RADIATION DOSE REDUCTION: This exam was performed according to the departmental dose-optimization program which includes automated exposure control, adjustment of the mA and/or kV according to patient size and/or use of iterative reconstruction technique. COMPARISON:  Chest radiographs yesterday and earlier. No prior chest CT. FINDINGS: Cardiovascular: Extensive Calcified aortic atherosclerosis. Prior sternotomy, CABG. Cardiac size within normal limits. No pericardial effusion. Vascular patency is not evaluated in the absence of IV contrast. Mediastinum/Nodes: Widespread but small reactive appearing mediastinal lymph nodes. No suspicious mediastinal mass or lymphadenopathy in the absence of contrast. Lungs/Pleura: Widespread bilateral subpleural pulmonary septal thickening. Mild background diffuse pulmonary interstitial thickening. Major airways appear patent with mild  generalized bronchial wall thickening. Symmetric costophrenic angle septal thickening with possible early honeycombing. No consolidation. No pleural effusion. No  convincing asymmetric lung inflammation. No suspicious pulmonary nodule or mass identified. Upper Abdomen: Negative visible noncontrast liver, gallbladder, spleen, pancreas, adrenal glands, kidneys, and bowel in the upper abdomen. No upper abdominal free air or free fluid. Musculoskeletal: Prior sternotomy. Widespread chronic disc and endplate degeneration. No acute or suspicious osseous lesion. IMPRESSION: 1. CT appearance of the lungs suspicious for a chronic interstitial lung disease, with possible early fibrosis/honeycombing at the lung bases. No focal pneumonia. No pleural effusion. Diffuse bronchial wall thickening such that Bronchitis could not be excluded. 2. Reactive appearing mediastinal lymph nodes. 3. Prior CABG. Aortic Atherosclerosis (ICD10-I70.0). Electronically Signed   By: Odessa Fleming M.D.   On: 03/30/2022 06:36   CT Head Wo Contrast  Result Date: 03/30/2022 CLINICAL DATA:  76 year old male with dizziness, vertigo. Respiratory illness with right lung base opacity on radiographs yesterday. Cough. EXAM: CT HEAD WITHOUT CONTRAST TECHNIQUE: Contiguous axial images were obtained from the base of the skull through the vertex without intravenous contrast. RADIATION DOSE REDUCTION: This exam was performed according to the departmental dose-optimization program which includes automated exposure control, adjustment of the mA and/or kV according to patient size and/or use of iterative reconstruction technique. COMPARISON:  Paranasal sinus CT 07/12/2003. FINDINGS: Brain: Cerebral volume is within normal limits for age. No midline shift, ventriculomegaly, mass effect, evidence of mass lesion, intracranial hemorrhage or evidence of cortically based acute infarction. Mild symmetric basal ganglia vascular calcifications. Mild for age scattered bilateral  cerebral white matter hypodensity. Gray-white differentiation otherwise normal for age. Incidental dural calcification involving the falx and tentorium. Vascular: Calcified atherosclerosis at the skull base. No suspicious intracranial vascular hyperdensity. Skull: No acute osseous abnormality identified. Sinuses/Orbits: Tympanic cavities and mastoids are clear. Scattered mild to moderate bilateral paranasal sinus mucosal thickening. Small fluid level in the left frontal sinus. Other: Visualized orbits and scalp soft tissues are within normal limits. IMPRESSION: 1. No acute intracranial abnormality. Mild for age cerebral white matter changes, most commonly due to chronic small vessel disease. 2. Consider Acute Sinusitis, with mild to moderate bilateral paranasal sinus inflammation. Electronically Signed   By: Odessa Fleming M.D.   On: 03/30/2022 06:30   DG Chest 2 View  Result Date: 03/29/2022 CLINICAL DATA:  Cough EXAM: CHEST - 2 VIEW COMPARISON:  Chest x-ray dated March 31, 2020 FINDINGS: Cardiac and mediastinal contours are unchanged post median sternotomy. Lower lung predominant coarse interstitial opacities are unchanged when compared with prior exam. Mild increased opacity of the right costophrenic angle. No pleural effusion or pneumothorax IMPRESSION: 1. Mild increased opacity of the right costophrenic angle, likely due to atelectasis, although infection or aspiration could appear similar. Recommend follow-up PA and lateral chest x-ray in 3-4 weeks to ensure resolution. 2. Unchanged lower lung predominant coarse interstitial opacities, likely due to chronic interstitial lung disease. Consider further evaluation with nonemergent ILD protocol CT. Electronically Signed   By: Allegra Lai M.D.   On: 03/29/2022 20:50    Procedures Procedures    Medications Ordered in ED Medications  cefTRIAXone (ROCEPHIN) 1 g in sodium chloride 0.9 % 100 mL IVPB (has no administration in time range)  sodium chloride 0.9  % bolus 1,000 mL (1,000 mLs Intravenous New Bag/Given 03/30/22 0456)  ondansetron (ZOFRAN) injection 4 mg (4 mg Intravenous Given 03/30/22 0454)    ED Course/ Medical Decision Making/ A&P                           Medical Decision Making  Amount and/or Complexity of Data Reviewed Radiology: ordered.  Risk Prescription drug management.   This patient presents to the ED for concern of not feeling well, abnormal chest x-ray, this involves an extensive number of treatment options, and is a complaint that carries with it a high risk of complications and morbidity.  I considered the following differential and admission for this acute, potentially life threatening condition.  The differential diagnosis includes infection such as pneumonia, influenza, COVID, UTI, metabolic derangement, dehydration  MDM:    This is a 76 year old male who presented to urgent care yesterday after not feeling well after 2 weeks.  He had an abnormal chest x-ray and was told to come the emergency department.  He does appear to have some mild AKI likely related to dehydration.  He states he has had some nausea and vomiting and chills during this time.  No other significant metabolic derangements.  Chest x-ray does show possible infiltrate versus interstitial lung disease.  He is test positive for COVID-19.  He is out of the window for Paxlovid.  He is not hypoxic.  He describes an episode of acute dizziness.  Currently he is neurologically intact.  He has no signs of dysmetria or cerebellar dysfunction.  CT head does not show any evidence of bleed or obvious subacute infarct.  There is some evidence of potential sinusitis although that is not the patient's primary complaint.  This could certainly cause some dizziness.  I have low suspicion at this time for acute stroke or TIA.  Episode is likely related to acute illness.  Urinalysis does have many bacteria.  We will send culture.  Given urinary symptoms and back pain, will treat  with Keflex.  This would also likely cover for any mild sinusitis.  Recommend patient follow-up in 2 to 4 weeks for repeat chest x-ray.  (Labs, imaging, consults)  Labs: I Ordered, and personally interpreted labs.  The pertinent results include: CBC, BMP, COVID-19, urinalysis  Imaging Studies ordered: I ordered imaging studies including chest x-ray, CT head, CT chest I independently visualized and interpreted imaging. I agree with the radiologist interpretation  Additional history obtained from chart review.  External records from outside source obtained and reviewed including urgent care notes  Cardiac Monitoring: The patient was maintained on a cardiac monitor.  I personally viewed and interpreted the cardiac monitored which showed an underlying rhythm of: Sinus rhythm  Reevaluation: After the interventions noted above, I reevaluated the patient and found that they have :stayed the same  Social Determinants of Health:  lives independently  Disposition: Discharge  Co morbidities that complicate the patient evaluation  Past Medical History:  Diagnosis Date   Age-related macular degeneration, dry, both eyes    Anxiety    Aortic stenosis    Arthritis    "probably all over" (06/08/2016)   Cardiomyopathy (HCC)    a. EF 40-45% in 2018, normalized in 03/2020.   Chronic back pain    "started in my lower back; going up my back in the last couple months" (06/08/2016)   Chronic sinusitis    Coronary artery disease    a. s/p CABGx 2V (2014 in [facility])  b. PCI (2015 in [facility]). c. PCI 2018 ([facility]). d. PCI 03/2020 (complex - [facility]).   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 06/09/2016   Sleep apnea    "got a mask after OR; don't use it" (06/08/2016)   Type II diabetes mellitus (HCC)      Medicines Meds ordered this encounter  Medications   sodium chloride 0.9 % bolus 1,000 mL   ondansetron (ZOFRAN) injection 4 mg   DISCONTD: HYDROcodone-acetaminophen (NORCO/VICODIN) 5-325 MG per tablet 1 tablet   cefTRIAXone (ROCEPHIN) 1 g in sodium chloride 0.9 % 100 mL IVPB    Order Specific Question:   Antibiotic Indication:    Answer:   UTI   cephALEXin (KEFLEX) 500 MG capsule    Sig: Take 1 capsule (500 mg total) by mouth 3 (three) times daily.    Dispense:  21 capsule    Refill:  0    I have reviewed the patients home medicines and have made adjustments as needed  Problem List / ED Course: Problem List Items Addressed This Visit   None Visit Diagnoses     COVID-19    -  Primary   Relevant Medications   cefTRIAXone (ROCEPHIN) 1 g in sodium chloride 0.9 % 100 mL IVPB   cephALEXin (KEFLEX) 500 MG capsule   Acute cystitis without hematuria                       Final Clinical Impression(s) / ED Diagnoses Final diagnoses:  COVID-19  Acute cystitis without hematuria    Rx / DC Orders ED Discharge Orders          Ordered    cephALEXin (KEFLEX) 500 MG capsule  3 times daily        03/30/22 0640              Shon Baton, MD 03/30/22 [phone]

## 2022-04-07 ENCOUNTER — Telehealth: Payer: Self-pay

## 2022-04-07 NOTE — Telephone Encounter (Signed)
        Patient  visited [facility] on 12/5  Telephone encounter attempt :   1st A HIPAA compliant voice message was left requesting a return call.  Instructed patient to call back     Nikkie Liming [facility] Guide, [facility], Care Management  [phone] [address], [place], [place] [address] Phone: [phone] Email: [email or web address]       

## 2022-04-08 ENCOUNTER — Telehealth: Payer: Self-pay

## 2022-04-08 NOTE — Telephone Encounter (Signed)
        Patient  visited [facility] on 12/5  Telephone encounter attempt :    A HIPAA compliant voice message was left requesting a return call.  Instructed patient to call back   Lenard Forth [facility] Guide, [facility], Care Management  [phone] [address] [place], [place], [place] [address] Phone: [phone] Email: [email or web address]

## 2022-07-28 DIAGNOSIS — K080 Exfoliation of teeth due to systemic causes: Secondary | ICD-10-CM | POA: Diagnosis not present

## 2023-02-01 DIAGNOSIS — K080 Exfoliation of teeth due to systemic causes: Secondary | ICD-10-CM | POA: Diagnosis not present

## 2023-09-05 DIAGNOSIS — K080 Exfoliation of teeth due to systemic causes: Secondary | ICD-10-CM | POA: Diagnosis not present

## 2023-10-12 ENCOUNTER — Other Ambulatory Visit (HOSPITAL_COMMUNITY): Payer: Self-pay

## 2023-10-23 ENCOUNTER — Inpatient Hospital Stay (HOSPITAL_COMMUNITY)
Admission: EM | Admit: 2023-10-23 | Discharge: 2023-10-26 | DRG: 321 | Disposition: A | Discharge status: Home Health | Attending: Internal Medicine | Admitting: Internal Medicine

## 2023-10-23 ENCOUNTER — Encounter (HOSPITAL_COMMUNITY): Payer: Self-pay

## 2023-10-23 ENCOUNTER — Emergency Department (HOSPITAL_COMMUNITY)

## 2023-10-23 ENCOUNTER — Other Ambulatory Visit: Payer: Self-pay

## 2023-10-23 DIAGNOSIS — Z96642 Presence of left artificial hip joint: Secondary | ICD-10-CM | POA: Diagnosis present

## 2023-10-23 DIAGNOSIS — I214 Non-ST elevation (NSTEMI) myocardial infarction: Secondary | ICD-10-CM | POA: Diagnosis not present

## 2023-10-23 DIAGNOSIS — Z95820 Peripheral vascular angioplasty status with implants and grafts: Secondary | ICD-10-CM

## 2023-10-23 DIAGNOSIS — Z7982 Long term (current) use of aspirin: Secondary | ICD-10-CM | POA: Diagnosis not present

## 2023-10-23 DIAGNOSIS — I4891 Unspecified atrial fibrillation: Secondary | ICD-10-CM | POA: Diagnosis present

## 2023-10-23 DIAGNOSIS — I25710 Atherosclerosis of autologous vein coronary artery bypass graft(s) with unstable angina pectoris: Secondary | ICD-10-CM | POA: Diagnosis not present

## 2023-10-23 DIAGNOSIS — Z955 Presence of coronary angioplasty implant and graft: Secondary | ICD-10-CM | POA: Diagnosis not present

## 2023-10-23 DIAGNOSIS — E1151 Type 2 diabetes mellitus with diabetic peripheral angiopathy without gangrene: Secondary | ICD-10-CM | POA: Diagnosis present

## 2023-10-23 DIAGNOSIS — I25810 Atherosclerosis of coronary artery bypass graft(s) without angina pectoris: Secondary | ICD-10-CM | POA: Diagnosis present

## 2023-10-23 DIAGNOSIS — Z86718 Personal history of other venous thrombosis and embolism: Secondary | ICD-10-CM

## 2023-10-23 DIAGNOSIS — E039 Hypothyroidism, unspecified: Secondary | ICD-10-CM | POA: Diagnosis present

## 2023-10-23 DIAGNOSIS — F1021 Alcohol dependence, in remission: Secondary | ICD-10-CM | POA: Diagnosis present

## 2023-10-23 DIAGNOSIS — I350 Nonrheumatic aortic (valve) stenosis: Secondary | ICD-10-CM | POA: Diagnosis present

## 2023-10-23 DIAGNOSIS — Z7902 Long term (current) use of antithrombotics/antiplatelets: Secondary | ICD-10-CM | POA: Diagnosis not present

## 2023-10-23 DIAGNOSIS — E1142 Type 2 diabetes mellitus with diabetic polyneuropathy: Secondary | ICD-10-CM | POA: Diagnosis present

## 2023-10-23 DIAGNOSIS — I739 Peripheral vascular disease, unspecified: Secondary | ICD-10-CM | POA: Diagnosis not present

## 2023-10-23 DIAGNOSIS — I110 Hypertensive heart disease with heart failure: Secondary | ICD-10-CM | POA: Diagnosis present

## 2023-10-23 DIAGNOSIS — Z7984 Long term (current) use of oral hypoglycemic drugs: Secondary | ICD-10-CM | POA: Diagnosis not present

## 2023-10-23 DIAGNOSIS — K219 Gastro-esophageal reflux disease without esophagitis: Secondary | ICD-10-CM | POA: Diagnosis present

## 2023-10-23 DIAGNOSIS — Z888 Allergy status to other drugs, medicaments and biological substances status: Secondary | ICD-10-CM

## 2023-10-23 DIAGNOSIS — Z809 Family history of malignant neoplasm, unspecified: Secondary | ICD-10-CM

## 2023-10-23 DIAGNOSIS — H353130 Nonexudative age-related macular degeneration, bilateral, stage unspecified: Secondary | ICD-10-CM | POA: Diagnosis present

## 2023-10-23 DIAGNOSIS — I5032 Chronic diastolic (congestive) heart failure: Secondary | ICD-10-CM | POA: Diagnosis not present

## 2023-10-23 DIAGNOSIS — I2489 Other forms of acute ischemic heart disease: Secondary | ICD-10-CM | POA: Diagnosis not present

## 2023-10-23 DIAGNOSIS — Z79899 Other long term (current) drug therapy: Secondary | ICD-10-CM | POA: Diagnosis not present

## 2023-10-23 DIAGNOSIS — I444 Left anterior fascicular block: Secondary | ICD-10-CM | POA: Diagnosis present

## 2023-10-23 DIAGNOSIS — E1159 Type 2 diabetes mellitus with other circulatory complications: Secondary | ICD-10-CM | POA: Diagnosis not present

## 2023-10-23 DIAGNOSIS — E785 Hyperlipidemia, unspecified: Secondary | ICD-10-CM | POA: Diagnosis present

## 2023-10-23 DIAGNOSIS — E782 Mixed hyperlipidemia: Secondary | ICD-10-CM | POA: Diagnosis not present

## 2023-10-23 DIAGNOSIS — E119 Type 2 diabetes mellitus without complications: Secondary | ICD-10-CM

## 2023-10-23 DIAGNOSIS — Z87891 Personal history of nicotine dependence: Secondary | ICD-10-CM | POA: Diagnosis not present

## 2023-10-23 DIAGNOSIS — Z79890 Hormone replacement therapy (postmenopausal): Secondary | ICD-10-CM

## 2023-10-23 DIAGNOSIS — I5021 Acute systolic (congestive) heart failure: Secondary | ICD-10-CM | POA: Diagnosis not present

## 2023-10-23 DIAGNOSIS — I255 Ischemic cardiomyopathy: Secondary | ICD-10-CM | POA: Diagnosis present

## 2023-10-23 DIAGNOSIS — Z881 Allergy status to other antibiotic agents status: Secondary | ICD-10-CM

## 2023-10-23 DIAGNOSIS — I272 Pulmonary hypertension, unspecified: Secondary | ICD-10-CM | POA: Diagnosis present

## 2023-10-23 DIAGNOSIS — I252 Old myocardial infarction: Secondary | ICD-10-CM

## 2023-10-23 DIAGNOSIS — I25110 Atherosclerotic heart disease of native coronary artery with unstable angina pectoris: Secondary | ICD-10-CM | POA: Diagnosis not present

## 2023-10-23 DIAGNOSIS — R5381 Other malaise: Secondary | ICD-10-CM | POA: Diagnosis present

## 2023-10-23 DIAGNOSIS — I2510 Atherosclerotic heart disease of native coronary artery without angina pectoris: Secondary | ICD-10-CM | POA: Diagnosis present

## 2023-10-23 LAB — TROPONIN I (HIGH SENSITIVITY)
Troponin I (High Sensitivity): 525 ng/L (ref <18)
Troponin I (High Sensitivity): 520 ng/L (ref <18)

## 2023-10-23 LAB — MAGNESIUM: Magnesium: 2.2 mg/dL (ref 1.7–2.4)

## 2023-10-23 LAB — URINALYSIS, W/ REFLEX TO CULTURE (INFECTION SUSPECTED)
Bacteria, UA: NONE SEEN
Bilirubin Urine: NEGATIVE
Glucose, UA: >=500 mg/dL — AB
Hgb urine dipstick: NEGATIVE
Ketones, ur: 5 mg/dL — AB
Leukocytes,Ua: NEGATIVE
Nitrite: NEGATIVE
Protein, ur: NEGATIVE mg/dL
Specific Gravity, Urine: >1.046 — ABNORMAL HIGH (ref 1.005–1.030)
pH: 5 (ref 5.0–8.0)

## 2023-10-23 LAB — TSH: TSH: 1.848 u[IU]/mL (ref 0.350–4.500)

## 2023-10-23 LAB — BASIC METABOLIC PANEL WITH GFR
Anion gap: 12 (ref 5–15)
BUN: 18 mg/dL (ref 8–23)
CO2: 21 mmol/L — ABNORMAL LOW (ref 22–32)
Calcium: 9 mg/dL (ref 8.9–10.3)
Chloride: 103 mmol/L (ref 98–111)
Creatinine, Ser: 1.31 mg/dL — ABNORMAL HIGH (ref 0.61–1.24)
GFR, Estimated: 55 mL/min — ABNORMAL LOW (ref >60)
Glucose, Bld: 177 mg/dL — ABNORMAL HIGH (ref 70–99)
Potassium: 4.1 mmol/L (ref 3.5–5.1)
Sodium: 136 mmol/L (ref 135–145)

## 2023-10-23 LAB — CBC
HCT: 35.6 % — ABNORMAL LOW (ref 39.0–52.0)
Hemoglobin: 11.5 g/dL — ABNORMAL LOW (ref 13.0–17.0)
MCH: 28 pg (ref 26.0–34.0)
MCHC: 32.3 g/dL (ref 30.0–36.0)
MCV: 86.8 fL (ref 80.0–100.0)
Platelets: 429 10*3/uL — ABNORMAL HIGH (ref 150–400)
RBC: 4.1 MIL/uL — ABNORMAL LOW (ref 4.22–5.81)
RDW: 12.2 % (ref 11.5–15.5)
WBC: 8.1 10*3/uL (ref 4.0–10.5)
nRBC: 0 % (ref 0.0–0.2)

## 2023-10-23 LAB — RESP PANEL BY RT-PCR (RSV, FLU A&B, COVID)  RVPGX2
Influenza A by PCR: NEGATIVE
Influenza B by PCR: NEGATIVE
Resp Syncytial Virus by PCR: NEGATIVE
SARS Coronavirus 2 by RT PCR: NEGATIVE

## 2023-10-23 LAB — HEPATIC FUNCTION PANEL
ALT: 18 U/L (ref 0–44)
AST: 28 U/L (ref 15–41)
Albumin: 2.8 g/dL — ABNORMAL LOW (ref 3.5–5.0)
Alkaline Phosphatase: 71 U/L (ref 38–126)
Bilirubin, Direct: <0.1 mg/dL (ref 0.0–0.2)
Total Bilirubin: 0.5 mg/dL (ref 0.0–1.2)
Total Protein: 6.6 g/dL (ref 6.5–8.1)

## 2023-10-23 LAB — BRAIN NATRIURETIC PEPTIDE: B Natriuretic Peptide: 873.7 pg/mL — ABNORMAL HIGH (ref 0.0–100.0)

## 2023-10-23 MED ORDER — ACETAMINOPHEN 325 MG PO TABS: 650.0000 mg | ORAL_TABLET | ORAL | Status: DC | PRN

## 2023-10-23 MED ORDER — IOHEXOL 350 MG/ML SOLN
75.0000 mL | Freq: Once | INTRAVENOUS | Status: AC | PRN
  Administered 2023-10-23: 75 mL via INTRAVENOUS

## 2023-10-23 MED ORDER — ROSUVASTATIN CALCIUM 20 MG PO TABS
40.0000 mg | ORAL_TABLET | Freq: Every day | ORAL | Status: DC
  Administered 2023-10-24 – 2023-10-26 (×3): 40 mg via ORAL
  Filled 2023-10-23 (×3): qty 2

## 2023-10-23 MED ORDER — ASPIRIN 81 MG PO CHEW
243.0000 mg | CHEWABLE_TABLET | Freq: Once | ORAL | Status: AC
  Administered 2023-10-23: 243 mg via ORAL
  Filled 2023-10-23: qty 3

## 2023-10-23 MED ORDER — METOPROLOL TARTRATE 25 MG PO TABS
25.0000 mg | ORAL_TABLET | Freq: Two times a day (BID) | ORAL | Status: DC
  Administered 2023-10-23 – 2023-10-26 (×6): 25 mg via ORAL
  Filled 2023-10-23 (×6): qty 1

## 2023-10-23 MED ORDER — TICAGRELOR 90 MG PO TABS
90.0000 mg | ORAL_TABLET | Freq: Two times a day (BID) | ORAL | Status: DC
  Administered 2023-10-23 – 2023-10-26 (×6): 90 mg via ORAL
  Filled 2023-10-23 (×6): qty 1

## 2023-10-23 MED ORDER — HEPARIN BOLUS VIA INFUSION
3900.0000 [IU] | Freq: Once | INTRAVENOUS | Status: AC
  Administered 2023-10-23: 3900 [IU] via INTRAVENOUS
  Filled 2023-10-23: qty 3900

## 2023-10-23 MED ORDER — ASPIRIN 81 MG PO TBEC
81.0000 mg | DELAYED_RELEASE_TABLET | Freq: Every day | ORAL | Status: DC
Start: 2023-10-24 — End: 2023-10-26
  Administered 2023-10-24 – 2023-10-26 (×3): 81 mg via ORAL
  Filled 2023-10-23 (×3): qty 1

## 2023-10-23 MED ORDER — HEPARIN (PORCINE) 25000 UT/250ML-% IV SOLN
1000.0000 [IU]/h | INTRAVENOUS | Status: DC
  Administered 2023-10-23: 750 [IU]/h via INTRAVENOUS
  Administered 2023-10-24: 1000 [IU]/h via INTRAVENOUS
  Filled 2023-10-23 (×3): qty 250

## 2023-10-23 MED ORDER — ONDANSETRON HCL 4 MG/2ML IJ SOLN: 4.0000 mg | Freq: Four times a day (QID) | INTRAMUSCULAR | Status: DC | PRN

## 2023-10-23 NOTE — ED Notes (Signed)
 Awaiting patient from lobby.

## 2023-10-23 NOTE — ED Triage Notes (Signed)
 Pt came in via POV d/t the last 6 months having angina with sciatica bothering him more the past 3 wks as well. He decided after the last 3-4 days of having exertional SOB & feeling blood rush to his head when walking several steps that he needed to get checked out. Does have a cardiologist appointment for 11/10/23 but felt he should not wait to be seen. Hx of 8 stents & 2 bypasses (per pt). A/Ox4, denies pain while in triage. Does report Lt leg pain if he moves around.

## 2023-10-23 NOTE — ED Provider Notes (Signed)
 [place] EMERGENCY DEPARTMENT AT [facility] Provider Note   CSN: [id number] Arrival date & time: 10/23/23  1543     Patient presents with: Exertional SOB   Luke Harvey is a 78 y.o. male.   The history is provided by the patient and medical records. No language interpreter was used.  Shortness of Breath Severity:  Severe Onset quality:  Gradual Duration:  3 days Timing:  Sporadic Progression:  Waxing and waning Chronicity:  New Context: not URI   Relieved by:  Nothing Worsened by:  Nothing Ineffective treatments:  None tried Associated symptoms: chest pain and cough   Associated symptoms: no abdominal pain, no diaphoresis, no fever, no headaches, no hemoptysis, no neck pain, no rash, no sputum production, no vomiting and no wheezing        Prior to Admission medications   Medication Sig Start Date End Date Taking? Authorizing Provider  acetaminophen  (TYLENOL ) 325 MG tablet Take 2 tablets (650 mg total) by mouth every 4 (four) hours as needed for headache or mild pain. 06/09/16   Marylu Leita SAUNDERS, NP  Alpha-Lipoic Acid 200 MG CAPS Take 200 mg by mouth daily.    [provider]  APPLE CIDER VINEGAR PO Take 450 mg by mouth daily.    [provider]  aspirin  EC 81 MG tablet Take 81 mg by mouth daily.    [provider]  Bacillus Coagulans-Inulin (PROBIOTIC-PREBIOTIC PO) Take 1 tablet by mouth 3 (three) times daily. Bio Complete 3    [provider]  cephALEXin  (KEFLEX ) 500 MG capsule Take 1 capsule (500 mg total) by mouth 3 (three) times daily. 03/30/22   Horton, Charmaine FALCON, MD  cholecalciferol (VITAMIN D) 25 MCG (1000 UNIT) tablet Take 1,000 Units by mouth daily.    [provider]  citalopram  (CELEXA ) 40 MG tablet Take 20 mg by mouth daily.     [provider]  Coenzyme Q10 200 MG capsule Take 200 mg by mouth daily.    [provider]  Cyanocobalamin (VITAMIN B12 PO) Take 3,000 mcg by mouth daily.     [provider]  diclofenac Sodium (VOLTAREN) 1 % GEL as needed for pain. 02/02/22   [provider]  doxycycline  (MONODOX ) 50 MG capsule Take 50 mg by mouth daily.    [provider]  empagliflozin (JARDIANCE) 25 MG TABS tablet Take 25 mg by mouth daily.    [provider]  fluocinonide cream (LIDEX) 0.05 % as needed for irritation. 05/04/06   [provider]  furosemide (LASIX) 20 MG tablet Take 20 mg by mouth as needed (leg swelling).    [provider]  Ivermectin 1 % CREA as needed for irritation. 02/23/21   [provider]  ketoconazole (NIZORAL) 2 % cream daily. 02/23/21   [provider]  levOCARNitine (L-CARNITINE PO) Take 1 tablet by mouth daily.    [provider]  levothyroxine  (SYNTHROID ) 75 MCG tablet daily. 02/02/22   [provider]  magnesium oxide (MAG-OX) 400 (240 Mg) MG tablet Take 400 mg by mouth daily.    [provider]  metFORMIN  (GLUCOPHAGE ) 500 MG tablet Take 1 tablet by mouth 2 (two) times daily. 05/04/06   [provider]  metoprolol  (LOPRESSOR ) 50 MG tablet Take 25 mg by mouth 2 (two) times daily.     [provider]  milk thistle 175 MG tablet Take 175 mg by mouth daily.    [provider]  Multiple Vitamins-Minerals (HAIR/SKIN/NAILS)  CAPS Take 5,000 mcg by mouth daily.    [provider]  Multiple Vitamins-Minerals (PRESERVISION AREDS PO) Take 1 tablet by mouth 2 (two) times daily.    [provider]  nitroGLYCERIN  (NITROSTAT ) 0.4 MG SL tablet Place 0.4 mg under the tongue every 5 (five) minutes as needed for chest pain.    [provider]  OVER THE COUNTER MEDICATION Take 1 capsule by mouth 3 (three) times daily. VisiUltra    [provider]  OVER THE COUNTER MEDICATION Take 1 Capful by mouth 3 (three) times daily. Juvenon    [provider]  OVER THE COUNTER MEDICATION Take 1 Capful by mouth 2 (two)  times daily. Nerve Control 911    [provider]  OVER THE COUNTER MEDICATION Take 1 capsule by mouth daily. Blood bo0st formulia    [provider]  OVER THE COUNTER MEDICATION Take 1 capsule by mouth daily. Glucofort    [provider]  OVER THE COUNTER MEDICATION Take 3 capsules by mouth daily. Balance of nature  Fruits and    [provider]  OVER THE COUNTER MEDICATION Take 3 capsules by mouth daily. Balance of [employer], Historical, MD  OVER THE COUNTER MEDICATION Take 1 Scoop by mouth daily. [employer] power greens mix with water    [provider]  OVER THE COUNTER MEDICATION Take 0.5 Scoops by mouth daily. Vitalreds Fruit mix with water    [provider]  polycarbophil (FIBERCON) 625 MG tablet Take 625 mg by mouth daily.    [provider]  Quercetin 500 MG CAPS Take 500 mg by mouth daily.    [provider]  rosuvastatin  (CRESTOR ) 40 MG tablet Take 1 tablet (40 mg total) by mouth daily. 04/05/20   Dunn, Dayna N, PA-C  Saw Palmetto 450 MG CAPS Take 450 mg by mouth daily.    [provider]  selenium (SELENIMIN-200) 200 MCG TABS tablet Take 200 mcg by mouth daily.    [provider]  TART CHERRY PO Take 1,200 mg by mouth daily. Extract    [provider]  ticagrelor  (BRILINTA ) 90 MG TABS tablet Take 1 tablet (90 mg total) by mouth 2 (two) times daily. 04/05/20   Dunn, Dayna N, PA-C  Turmeric Curcumin 500 MG CAPS Take 500 mg by mouth daily.    [provider]    Allergies: Tetracyclines & related, Lisinopril , Losartan, and Lipitor [atorvastatin]    Review of Systems  Constitutional:  Positive for fatigue. Negative for chills, diaphoresis and fever.  HENT:  Negative for congestion.   Respiratory:  Positive for cough, chest tightness and shortness of breath. Negative for hemoptysis, sputum production, wheezing and stridor.   Cardiovascular:  Positive for chest  pain. Negative for palpitations and leg swelling.  Gastrointestinal:  Negative for abdominal pain, constipation, diarrhea, nausea and vomiting.  Genitourinary:  Negative for dysuria and flank pain.  Musculoskeletal:  Negative for back pain, neck pain and neck stiffness.  Skin:  Negative for rash and wound.  Neurological:  Negative for weakness, light-headedness and headaches.  Psychiatric/Behavioral:  Negative for agitation and confusion.   All other systems reviewed and are negative.   Updated Vital Signs BP 111/69 (BP Location: Right Arm)   Pulse 94   Temp 98.2 F (36.8 C) (Oral)   Resp 19   Ht 5' 6 (1.676 m)   Wt 65.2 kg   SpO2 99%   BMI 23.21 kg/m   Physical  Exam Vitals and nursing note reviewed.  Constitutional:      General: He is not in acute distress.    Appearance: He is well-developed. He is not ill-appearing, toxic-appearing or diaphoretic.  HENT:     Head: Normocephalic and atraumatic.     Nose: No congestion or rhinorrhea.     Mouth/Throat:     Mouth: Mucous membranes are moist.     Pharynx: No oropharyngeal exudate or posterior oropharyngeal erythema.   Eyes:     Extraocular Movements: Extraocular movements intact.     Conjunctiva/sclera: Conjunctivae normal.     Pupils: Pupils are equal, round, and reactive to light.    Cardiovascular:     Rate and Rhythm: Regular rhythm. Tachycardia present.     Heart sounds: No murmur heard. Pulmonary:     Effort: Pulmonary effort is normal. No respiratory distress.     Breath sounds: Rales present. No wheezing or rhonchi.  Chest:     Chest wall: No tenderness.  Abdominal:     General: Abdomen is flat.     Palpations: Abdomen is soft.     Tenderness: There is no abdominal tenderness. There is no right CVA tenderness, left CVA tenderness, guarding or rebound.   Musculoskeletal:        General: No swelling or tenderness.     Cervical back: Neck supple.     Right lower leg: No edema.     Left lower leg: No  edema.   Skin:    General: Skin is warm and dry.     Capillary Refill: Capillary refill takes less than 2 seconds.     Findings: No erythema or rash.   Neurological:     General: No focal deficit present.     Mental Status: He is alert.   Psychiatric:        Mood and Affect: Mood normal.     (all labs ordered are listed, but only abnormal results are displayed) Labs Reviewed  BASIC METABOLIC PANEL WITH GFR - Abnormal; Notable for the following components:      Result Value   CO2 21 (*)    Glucose, Bld 177 (*)    Creatinine, Ser 1.31 (*)    GFR, Estimated 55 (*)    All other components within normal limits  CBC - Abnormal; Notable for the following components:   RBC 4.10 (*)    Hemoglobin 11.5 (*)    HCT 35.6 (*)    Platelets 429 (*)    All other components within normal limits  HEPATIC FUNCTION PANEL - Abnormal; Notable for the following components:   Albumin 2.8 (*)    All other components within normal limits  BRAIN NATRIURETIC PEPTIDE - Abnormal; Notable for the following components:   B Natriuretic Peptide 873.7 (*)    All other components within normal limits  TROPONIN I (HIGH SENSITIVITY) - Abnormal; Notable for the following components:   Troponin I (High Sensitivity) 525 (*)    All other components within normal limits  TROPONIN I (HIGH SENSITIVITY) - Abnormal; Notable for the following components:   Troponin I (High Sensitivity) 520 (*)    All other components within normal limits  RESP PANEL BY RT-PCR (RSV, FLU A&B, COVID)  RVPGX2  MAGNESIUM  TSH  URINALYSIS, W/ REFLEX TO CULTURE (INFECTION SUSPECTED)  HEPARIN  LEVEL (UNFRACTIONATED)  CBC    EKG: EKG Interpretation Date/Time:  Sunday October 23 2023 15:52:00 EDT Ventricular Rate:  93 PR Interval:  146 QRS Duration:  118  QT Interval:  380 QTC Calculation: 472 R Axis:   26  Text Interpretation: Normal sinus rhythm Incomplete left bundle branch block ST & T wave abnormality, consider inferolateral  ischemia Prolonged QT Abnormal ECG When compared with ECG of 30-Mar-2022 05:38, PREVIOUS ECG IS PRESENT when compared to prior, new ST depressions No STEMI Confirmed by Ginger Barefoot ([id number]) on 10/23/2023 5:02:25 PM  Radiology: [facility] Chest Port 1 View Result Date: 10/23/2023 CLINICAL DATA:  Worsening angina with sciatica and shortness of breath. EXAM: PORTABLE CHEST 1 VIEW COMPARISON:  March 29, 2022 FINDINGS: Multiple sternal wires and vascular clips are present. The heart size and mediastinal contours are within normal limits. A coronary artery stent is in place. Mild to moderate severity areas of scarring, atelectasis and/or infiltrate are seen within the bilateral lung bases. There is a small right pleural effusion. No pneumothorax is identified. Postoperative changes are seen involving the cervical spine. No acute osseous abnormalities are present. IMPRESSION: 1. Evidence of prior median sternotomy/CABG. 2. Mild to moderate severity bibasilar scarring, atelectasis and/or infiltrate. 3. Small right pleural effusion. Electronically Signed   By: Suzen Dials M.D.   On: 10/23/2023 19:46   CT Angio Chest PE W and/or Wo Contrast Result Date: 10/23/2023 CLINICAL DATA:  PE suspected. History of blood clots. Exertional shortness of breath and flushing. Possible superior vena cava syndrome. Two EXAM: CT ANGIOGRAPHY CHEST WITH CONTRAST TECHNIQUE: Multidetector CT imaging of the chest was performed using the standard protocol during bolus administration of intravenous contrast. Multiplanar CT image reconstructions and MIPs were obtained to evaluate the vascular anatomy. RADIATION DOSE REDUCTION: This exam was performed according to the departmental dose-optimization program which includes automated exposure control, adjustment of the mA and/or kV according to patient size and/or use of iterative reconstruction technique. CONTRAST:  75mL OMNIPAQUE  IOHEXOL  350 MG/ML SOLN COMPARISON:  Chest radiograph 10/23/2023  and CT chest 03/30/2022 FINDINGS: Cardiovascular: Cardiomegaly. No pericardial effusion. Negative for pulmonary embolism. Coronary artery and aortic atherosclerotic calcification. Sternotomy and CABG. Right arm venous contrast injection flows through the SVC. No evidence of clot within the SVC noting mixing artifact and streak artifact from dense contrast. Mediastinum/Nodes: Trachea and esophagus are unremarkable. No thoracic adenopathy. Lungs/Pleura: Diffuse interlobular septal thickening and bronchial wall thickening greatest in the lower lungs. Scattered confluent subpleural opacities in the lower lungs. Small right and trace left pleural effusions. No pneumothorax. Upper Abdomen: No acute abnormality. Musculoskeletal: No acute fracture. Review of the MIP images confirms the above findings. IMPRESSION: 1. Negative for pulmonary embolism or SVC syndrome. 2. Cardiomegaly with pulmonary edema. Small right and trace left pleural effusions. 3. Aortic Atherosclerosis (ICD10-I70.0). Electronically Signed   By: Norman Gatlin M.D.   On: 10/23/2023 19:25     Procedures   Medications Ordered in the ED  heparin  ADULT infusion 100 units/mL (25000 units/250mL) (750 Units/hr Intravenous New Bag/Given 10/23/23 1950)  aspirin  EC tablet 81 mg (has no administration in time range)  metoprolol  tartrate (LOPRESSOR ) tablet 25 mg (has no administration in time range)  rosuvastatin  (CRESTOR ) tablet 40 mg (has no administration in time range)  ticagrelor  (BRILINTA ) tablet 90 mg (has no administration in time range)  iohexol  (OMNIPAQUE ) 350 MG/ML injection 75 mL (75 mLs Intravenous Contrast Given 10/23/23 1854)  aspirin  chewable tablet 243 mg (243 mg Oral Given 10/23/23 1929)  heparin  bolus via infusion 3,900 Units (3,900 Units Intravenous Bolus from Bag 10/23/23 1950)   CRITICAL CARE Performed by: Lonni PARAS Venda Dice Total critical care time: 35 minutes Critical care  time was exclusive of separately billable procedures  and treating other patients. Critical care was necessary to treat or prevent imminent or life-threatening deterioration. Critical care was time spent personally by me on the following activities: development of treatment plan with patient and/or surrogate as well as nursing, discussions with consultants, evaluation of patient's response to treatment, examination of patient, obtaining history from patient or surrogate, ordering and performing treatments and interventions, ordering and review of laboratory studies, ordering and review of radiographic studies, pulse oximetry and re-evaluation of patient's condition.                                   Medical Decision Making Amount and/or Complexity of Data Reviewed Labs: ordered. Radiology: ordered.  Risk OTC drugs. Prescription drug management. Decision regarding hospitalization.    Luke Harvey is a 78 y.o. male with a past medical history significant for CAD with multiple CABG and stents, aortic stenosis, CABG, hyperlipidemia, previous DVT, and carotid arterectomy who presents with exertional shortness of breath and chest discomfort.  According to patient, for the last few months he has been having some intermittent angina symptoms with chest tightness and discomfort but has not been able to see his cardiologist yet.  He says that it has acutely worsened with exertional shortness of breath over the last few days and now cannot take more than several steps without getting completely winded and in respiratory distress.  He reports he does have some chest tightness and when he took a nitro which completely resolved it.  He reports no significant nausea or vomiting but has shortness of breath with it.  He reports no diaphoresis.  He denied any fevers or chills but had a dry cough.  Denies hemoptysis.  Denies any leg pain or leg swelling.  Denies any abdominal pain.  Denies any constipation, diarrhea, or urinary changes.  He reports this does not  feel exactly like his previous MI or he would have come sooner but he is worried about the worsening shortness of breath symptoms.  On exam, lungs had some faint crackles at the bases but were otherwise without wheezes.  Chest was nontender but I did hear murmur.  Abdomen nontender.  He does have intact pulses and legs were not critical edematous.  Patient resting comfortably sitting up but due to sciatic issues he says he does not want to lay down due to pain.  EKG does not show STEMI but does show some ST changes compared to prior.  Clinically I am concerned about a cardiac etiology however he also reports he had some flushing of his face with redness and he could have an SVC type syndrome or PE given his DVT history.  Will get CT PE study, labs, troponins, BNP, and anticipate he will need admission given his description of symptoms worsening.  Anticipate cardiology consultation after workup is further.  CT imaging did not show PE or SVC syndrome but troponin was elevated over 500.  I called cardiology who recommended heparinization, rest of aspirin  as he already took a baby aspirin  this morning, and admission to medicine for further management of suspected NSTEMI.  Patient will be admitted for further management.      Final diagnoses:  NSTEMI (non-ST elevated myocardial infarction) (HCC)    Clinical Impression: 1. NSTEMI (non-ST elevated myocardial infarction) [facility])     Disposition: Admit  This note was prepared with assistance of Dragon voice  recognition software. Occasional wrong-word or sound-a-like substitutions may have occurred due to the inherent limitations of voice recognition software.       Zahriyah Joo, Lonni PARAS, MD 10/23/23 2017

## 2023-10-23 NOTE — ED Notes (Signed)
 Pt sat on side of bed to use urinal and states he became sob and had mild chest pain. Pain and sob resolved when he laid back in the bed.

## 2023-10-23 NOTE — H&P (Signed)
 History and Physical    Patient: ABDULAHI Harvey FMW:[id number] DOB: [birth year] DOA: 10/23/2023 DOS: the patient was seen and examined on 10/23/2023 PCP: [facility]  Patient coming from: Home  Chief Complaint:  Chief Complaint  Patient presents with   Exertional SOB   HPI: Luke Harvey is a 78 y.o. male with medical history significant of coronary artery disease status post CABG, multiple PCI, ischemic cardiomyopathy with EF of 40 to 45%, essential hypertension, hyperlipidemia, type 2 diabetes, aortic stenosis, DVT, osteoarthritis, who presented with chest pain on and off over the last 6 months.  This has gradually gotten worse today he came to the ER due to the prolonged chest pain.  Patient came to the ER where he was evaluated and given nitroglycerin .  He responded to treatment and no chest pain now.  EKG shows sinus rhythm with occasional sinus tachycardia.  Patient also has initial troponin of 520 and then 500.  Suspected non-ST elevation MI.  Cardiology consulted and patient has been admitted for further evaluation and treatment  Review of Systems: As mentioned in the history of present illness. All other systems reviewed and are negative. Past Medical History:  Diagnosis Date   Age-related macular degeneration, dry, both eyes    Anxiety    Aortic stenosis    Arthritis    probably all over (06/08/2016)   Cardiomyopathy (HCC)    a. EF 40-45% in 2018, normalized in 03/2020.   Chronic back pain    started in my lower back; going up my back in the last couple months (06/08/2016)   Chronic sinusitis    Coronary artery disease    a. s/p CABGx 2V (2014 in [place])  b. PCI (2015 in [place]). c. PCI 2018 ([facility]). d. PCI 03/2020 (complex - [facility]).   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 06/09/2016   NSTEMI (non-ST elevated myocardial infarction) (HCC) 06/08/2016   Pneumonia    3-4 times maybe (06/08/2016)   PVD (peripheral vascular disease) (HCC)    Recovering alcoholic (HCC)    picked up my 30 year chip the other day (06/08/2016)   S/P angioplasty with stent, 06/08/16 DES, for in-stent restenosis in RCA. 06/09/2016   Sleep apnea    got a mask after OR; don't use it (06/08/2016)   Type II diabetes mellitus (HCC)    Past Surgical History:  Procedure Laterality Date   ANTERIOR CERVICAL DECOMP/DISCECTOMY FUSION     BACK SURGERY     CARDIAC CATHETERIZATION  03/2012   led to bypass   CAROTID ENDARTERECTOMY Right ~ 2014   COLONOSCOPY W/ BIOPSIES AND POLYPECTOMY     CORONARY ANGIOPLASTY WITH STENT PLACEMENT  2015; 06/08/2016   CORONARY ARTERY BYPASS GRAFT  04/10/2012   CORONARY ATHERECTOMY N/A 04/04/2020   Procedure: CORONARY ATHERECTOMY;  Surgeon: Burnard Debby LABOR, MD;  Location: [facility] INVASIVE CV LAB;  Service: Cardiovascular;  Laterality: N/A;   CORONARY STENT INTERVENTION N/A 06/08/2016   Procedure: Coronary Stent Intervention;  Surgeon: Debby LABOR Burnard, MD;  Location: [facility] INVASIVE CV  LAB;  Service: Cardiovascular;  Laterality: N/A;   CORONARY STENT INTERVENTION N/A 04/02/2020   Procedure: CORONARY STENT INTERVENTION;  Surgeon: Anner Alm ORN, MD;  Location: [facility] INVASIVE CV LAB;  Service: Cardiovascular;  Laterality: N/A;   CORONARY STENT INTERVENTION N/A 04/04/2020   Procedure: CORONARY STENT INTERVENTION;  Surgeon: Burnard Debby LABOR, MD;  Location: [facility] INVASIVE CV LAB;  Service: Cardiovascular;  Laterality: N/A;   CORONARY STENT INTERVENTION N/A 11/16/2021   Procedure: CORONARY STENT INTERVENTION;  Surgeon: Mady Bruckner, MD;  Location: [facility] INVASIVE CV LAB;  Service: Cardiovascular;   Laterality: N/A;   CORONARY ULTRASOUND/IVUS N/A 04/04/2020   Procedure: Intravascular Ultrasound/IVUS;  Surgeon: Burnard Debby LABOR, MD;  Location: [facility] INVASIVE CV LAB;  Service: Cardiovascular;  Laterality: N/A;   JOINT REPLACEMENT     LEFT HEART CATH AND CORONARY ANGIOGRAPHY N/A 06/08/2016   Procedure: Left Heart Cath and Coronary Angiography;  Surgeon: Debby LABOR Burnard, MD;  Location: [facility] INVASIVE CV LAB;  Service: Cardiovascular;  Laterality: N/A;   LEFT HEART CATH AND CORS/GRAFTS ANGIOGRAPHY N/A 04/02/2020   Procedure: LEFT HEART CATH AND CORS/GRAFTS ANGIOGRAPHY;  Surgeon: Anner Alm ORN, MD;  Location: [facility] INVASIVE CV LAB;  Service: Cardiovascular;  Laterality: N/A;   LEFT HEART CATH AND CORS/GRAFTS ANGIOGRAPHY N/A 11/16/2021   Procedure: LEFT HEART CATH AND CORS/GRAFTS ANGIOGRAPHY;  Surgeon: Mady Bruckner, MD;  Location: [facility] INVASIVE CV LAB;  Service: Cardiovascular;  Laterality: N/A;   REVISION TOTAL HIP ARTHROPLASTY Left ~ 2004   TEMPORARY PACEMAKER N/A 04/04/2020   Procedure: TEMPORARY PACEMAKER;  Surgeon: Burnard Debby LABOR, MD;  Location: [facility] INVASIVE CV LAB;  Service: Cardiovascular;  Laterality: N/A;   TONSILLECTOMY     TOTAL HIP ARTHROPLASTY Left 1990s   Social History:  reports that he quit smoking about 15 years ago. His smoking use included cigarettes. He started smoking about 55 years ago. He has a 40 pack-year smoking history. He has never used smokeless tobacco. He reports that he does not drink alcohol and does not use drugs.  Allergies  Allergen Reactions   Tetracyclines & Related Hives and Itching   Lisinopril  Cough   Losartan Cough   Lipitor [Atorvastatin] Other (See Comments)    Sore muscles    Family History  Problem Relation Age of Onset   Cancer Mother     Prior to Admission medications   Medication Sig Start Date End Date Taking? Authorizing Provider  acetaminophen  (TYLENOL ) 325 MG tablet Take 2 tablets (650 mg total) by mouth every 4 (four) hours as needed for  headache or mild pain. 06/09/16   Marylu Leita SAUNDERS, NP  Alpha-Lipoic Acid 200 MG CAPS Take 200 mg by mouth daily.    [provider]  APPLE CIDER VINEGAR PO Take 450 mg by mouth daily.    [provider]  aspirin  EC 81 MG tablet Take 81 mg by mouth daily.    [provider]  Bacillus Coagulans-Inulin (PROBIOTIC-PREBIOTIC PO) Take 1 tablet by mouth 3 (three) times daily. Bio Complete 3    [provider]  cephALEXin  (KEFLEX ) 500 MG capsule Take 1 capsule (500 mg total) by mouth 3 (three) times daily. 03/30/22   Horton, Charmaine FALCON, MD  cholecalciferol (VITAMIN D) 25 MCG (1000 UNIT) tablet Take 1,000 Units by mouth daily.    [provider]  citalopram  (CELEXA ) 40 MG tablet Take 20 mg by mouth daily.     [provider]  Coenzyme Q10 200 MG capsule Take 200 mg by mouth daily.  [provider]  Cyanocobalamin (VITAMIN B12 PO) Take 3,000 mcg by mouth daily.    [provider]  diclofenac Sodium (VOLTAREN) 1 % GEL as needed for pain. 02/02/22   [provider]  doxycycline  (MONODOX ) 50 MG capsule Take 50 mg by mouth daily.    [provider]  empagliflozin (JARDIANCE) 25 MG TABS tablet Take 25 mg by mouth daily.    [provider]  fluocinonide cream (LIDEX) 0.05 % as needed for irritation. 05/04/06   [provider]  furosemide (LASIX) 20 MG tablet Take 20 mg by mouth as needed (leg swelling).    [provider]  Ivermectin 1 % CREA as needed for irritation. 02/23/21   [provider]  ketoconazole (NIZORAL) 2 % cream daily. 02/23/21   [provider]  levOCARNitine (L-CARNITINE PO) Take 1 tablet by mouth daily.    [provider]  levothyroxine  (SYNTHROID ) 75 MCG tablet daily. 02/02/22   [provider]  magnesium oxide (MAG-OX) 400 (240 Mg) MG tablet Take 400 mg by mouth daily.    [provider]  metFORMIN  (GLUCOPHAGE ) 500 MG tablet Take 1  tablet by mouth 2 (two) times daily. 05/04/06   [provider]  metoprolol  (LOPRESSOR ) 50 MG tablet Take 25 mg by mouth 2 (two) times daily.     [provider]  milk thistle 175 MG tablet Take 175 mg by mouth daily.    [provider]  Multiple Vitamins-Minerals (HAIR/SKIN/NAILS) CAPS Take 5,000 mcg by mouth daily.    [provider]  Multiple Vitamins-Minerals (PRESERVISION AREDS PO) Take 1 tablet by mouth 2 (two) times daily.    [provider]  nitroGLYCERIN  (NITROSTAT ) 0.4 MG SL tablet Place 0.4 mg under the tongue every 5 (five) minutes as needed for chest pain.    [provider]  OVER THE COUNTER MEDICATION Take 1 capsule by mouth 3 (three) times daily. VisiUltra    [provider]  OVER THE COUNTER MEDICATION Take 1 Capful by mouth 3 (three) times daily. Juvenon    [provider]  OVER THE COUNTER MEDICATION Take 1 Capful by mouth 2 (two) times daily. Nerve Control 911    [provider]  OVER THE COUNTER MEDICATION Take 1 capsule by mouth daily. Blood bo0st formulia    [provider]  OVER THE COUNTER MEDICATION Take 1 capsule by mouth daily. Glucofort    [provider]  OVER THE COUNTER MEDICATION Take 3 capsules by mouth daily. Balance of nature  Fruits and    [provider]  OVER THE COUNTER MEDICATION Take 3 capsules by mouth daily. Balance of [employer], Historical, MD  OVER THE COUNTER MEDICATION Take 1 Scoop by mouth daily. patriot power greens mix with water    [provider]  OVER THE COUNTER MEDICATION Take 0.5 Scoops by mouth daily. Vitalreds Fruit mix with water    [provider]  polycarbophil (FIBERCON) 625 MG tablet Take 625 mg by mouth daily.    [provider]  Quercetin 500 MG CAPS Take 500 mg by mouth daily.    [provider]  rosuvastatin  (CRESTOR ) 40 MG tablet Take 1 tablet (40 mg total) by mouth  daily. 04/05/20   Dunn, Dayna N, PA-C  Saw Palmetto 450 MG CAPS Take 450 mg by mouth daily.    [provider]  selenium (SELENIMIN-200) 200 MCG TABS tablet Take 200 mcg by mouth daily.  [provider]  TART CHERRY PO Take 1,200 mg by mouth daily. Extract    [provider]  ticagrelor  (BRILINTA ) 90 MG TABS tablet Take 1 tablet (90 mg total) by mouth 2 (two) times daily. 04/05/20   Dunn, Dayna N, PA-C  Turmeric Curcumin 500 MG CAPS Take 500 mg by mouth daily.    [provider]    Physical Exam: Vitals:   10/23/23 1559 10/23/23 1745 10/23/23 1815 10/23/23 1845  BP:  118/75 117/68 125/83  Pulse:  93 93 97  Resp:  17 15 20   Temp:      TempSrc:      SpO2:  100% 100% 100%  Weight: 65.2 kg     Height: 5' 6 (1.676 m)      Constitutional: NAD, calm, comfortable Eyes: PERRL, lids and conjunctivae normal ENMT: Mucous membranes are moist. Posterior pharynx clear of any exudate or lesions.Normal dentition.  Neck: normal, supple, no masses, no thyromegaly Respiratory: clear to auscultation bilaterally, no wheezing, no crackles. Normal respiratory effort. No accessory muscle use.  Cardiovascular: Regular rate and rhythm, no murmurs / rubs / gallops. No extremity edema. 2+ pedal pulses. No carotid bruits.  Abdomen: no tenderness, no masses palpated. No hepatosplenomegaly. Bowel sounds positive.  Musculoskeletal: Good range of motion, no joint swelling or tenderness, Skin: no rashes, lesions, ulcers. No induration Neurologic: CN 2-12 grossly intact. Sensation intact, DTR normal. Strength 5/5 in all 4.  Psychiatric: Normal judgment and insight. Alert and oriented x 3. Normal mood  Data Reviewed:  Heart rate is 105 respirate 25 white count 8.1 hemoglobin 11.5 platelets 429 CO2 21 creatinine 1.31 glucose 177 and BNP 873 albumin 2.8 acute viral screen negative urinalysis negative CT angiogram of the chest showed no PE.  Showed cardiomegaly with pulmonary edema  small right and trace left pleural effusion chest x-ray shows evidence of prior CABG with small right pleural effusion.  EKG shows normal sinus rhythm with diffuse T wave inversions in the lateral leads.  Assessment and Plan:  #1 non-ST elevation MI: Patient's troponin is flat.  Could be from her known atrial fibrillation.  She could also still have NSTEMI.  Cardiology following.  Start heparin  drip.  Aspirin .  Statin.  Cardiology to follow.  #2 type 2 diabetes: Non-insulin -dependent.  Continue sliding scale insulin   #3 peripheral vascular disease: Continue to monitor.  Continue home regimen  #4 coronary artery disease: Status post CABG.  Continue as above  #5 ischemic cardiomyopathy: EF of 40 to 45%.  Continue home regimen  #6 hyperlipidemia: Continue with statin    Advance Care Planning:   Code Status: Full Code   Consults: [facility]  Family Communication: No family at bedside  Severity of Illness: The appropriate patient status for this patient is INPATIENT. Inpatient status is judged to be reasonable and necessary in order to provide the required intensity of service to ensure the patient's safety. The patient's presenting symptoms, physical exam findings, and initial radiographic and laboratory data in the context of their chronic comorbidities is felt to place them at high risk for further clinical deterioration. Furthermore, it is not anticipated that the patient will be medically stable for discharge from the hospital within 2 midnights of admission.   * I certify that at the point of admission it is my clinical judgment that the patient will require inpatient hospital care spanning beyond 2 midnights from the point of admission due to high intensity of service, high risk for further deterioration and high  frequency of surveillance required.*  AuthorBETHA SIM KNOLL, MD 10/23/2023 7:30 PM  For on call review [email or web address].

## 2023-10-23 NOTE — ED Provider Triage Note (Signed)
 Emergency Medicine Provider Triage Evaluation Note  Luke Harvey , a 78 y.o. male  was evaluated in triage.  Pt complains of shortness of breath which has worsened over the last 2 days.  Has had this problem for approximately 6 weeks according to family member at the bedside, however this worsened on Friday, cannot take 2 or 3 steps without feeling very flush.  Prior significant history of cardiac disease including multiple stents, and bypass surgeries.  Review of Systems  Positive: Sob, cough Negative: Cp,  Physical Exam  BP 111/69 (BP Location: Right Arm)   Pulse 94   Temp 98.2 F (36.8 C) (Oral)   Resp 19   Ht 5' 6 (1.676 m)   Wt 65.2 kg   SpO2 99%   BMI 23.21 kg/m  Gen:   Awake, no distress   Resp:  Normal effort  MSK:   Moves extremities without difficulty  Other:  Lungs are clear to auscultation, no pitting edema noted.  Medical Decision Making  Medically screening exam initiated at 4:03 PM.  Appropriate orders placed.  Luke Harvey was informed that the remainder of the evaluation will be completed by another provider, this initial triage assessment does not replace that evaluation, and the importance of remaining in the ED until their evaluation is complete.     Edithe Dobbin, PA-C 10/23/23 1607

## 2023-10-23 NOTE — Consult Note (Signed)
 Cardiology Consultation   Patient ID: Luke Harvey MRN: [id number]; DOB: Nov 04, 1945  Admit date: 10/23/2023 Date of Consult: 10/23/2023  PCP:  [facility]   [facility] Providers Cardiologist:  Candyce Reek, MD        Patient Profile: Luke Harvey is a 78 y.o. male with a hx of CAD status post CABG x 2 in 2014 with LIMA to LAD, SVG to circumflex/ OM status post stents to RCA, status post stent to SVG to OM in 2023, severe native vessel disease, ischemic cardiomyopathy, hypertension, hyperlipidemia, former smoker, history of DVT who is being seen 10/23/2023 for the evaluation of chest pain and shortness of breath at the request of Dr. Ginger.  History of Present Illness: Luke Harvey is a 78 y.o. male with a hx of CAD status post CABG x 2 in 2014 with LIMA to LAD, SVG to circumflex/ OM status post stents to RCA, status post stent to SVG to OM in 2023, severe native vessel disease, ischemic cardiomyopathy, hypertension, hyperlipidemia, former smoker, history of DVT who is being seen 10/23/2023 for the evaluation of chest pain and shortness of breath   Patient was last seen in 2023 for chest pain underwent PCI of SVG to OM, patent LIMA to LAD, severe native disease, patent RCA stent, moderate aortic stenosis has not followed up but has been following at [facility]. Presenting now with complaints of exertional shortness of breath, feeling flushed.  Also has, chest pain/tightness, which has been going on since 6 months.  For chest pain he has been on nitro sublingual and then changed to isosorbide long-acting which seems to improve for short-term.  No recent worsening of chest pain but significant exertional dyspnea  Patient has been going to the [facility], reports vitals were checked and everything reported normal.  Does not know if echocardiogram was done, denies any heart failure symptoms but progressive shortness of breath,-which is worse since  June of this year.  In  the ER his vitals were stable. EKG shows normal sinus rhythm, left axis deviation, left anterior fascicular block, ST depression in anterior/Anterolateral leads Creatinine 1.37, glucose 177, first troponin 525, second troponin is 520  CT PE study is pending  Prior cardiac workup Echo 2021 IMPRESSIONS     1. Left ventricular ejection fraction, by estimation, is 60 to 65%. The  left ventricle has normal function. The left ventricle has no regional  wall motion abnormalities. Left ventricular diastolic parameters are  indeterminate.   2. Right ventricular systolic function is normal. The right ventricular  size is normal. Tricuspid regurgitation signal is inadequate for assessing  PA pressure.   3. The mitral valve is normal in structure. Trivial mitral valve  regurgitation.   4. The aortic valve is calcified. There is moderate calcification of the  aortic valve. Aortic valve regurgitation is trivial. Mild to moderate  aortic valve stenosis. Mild AS by gradients (Vmax 2.5 m/s, MG ), but  low SV (SVi 31 cc/m^2) and [name] (0.9   cm^2) and DI (0.31) consistent with moderate AS. Suspect moderate AS.   5. The inferior vena cava is normal in size with greater than 50%  respiratory variability, suggesting right atrial pressure of 3 mmHg.   Cardiac cath 2023 Conclusions: Severe native left coronary artery diseas with 95% distal LMCA disease extending into LAD and LCx as well as CTO of proximal/mid LAD. Patent RCA stents with 30% in-stent restenosis in the mid/distal stent.  Proximal stent is widely patent. Widely  patent LIMA-LAD. Patent SVG-OM with 50% ostial and 25% proximal in-stent restenosis.  There is a 95% stenosis at the proximal margin of the mid graft stent as well as a 40-50% lesion at the distal stent edge. Mildly elevated left ventricular filling pressure (LVEDP 18 mmHg) with normal systolic function (LVEF 55-65%). Probably moderate aortic valve stenosis (peak-to-peak gradient  20 mmHg). Successful PCI to 95% stenosis in mid portion of SVG-OM using Synergy 3.0 x 16 mm drug-eluting stent that overlaps the previously placed stent.  There is 0% residual stenosis with TIMI-3 flow following PCI.   Recommendations: Continue indefinite dual antiplatelet therapy with aspirin  and ticagrelor .  One could consider stopping aspirin  as soon as 3 months post-PCI if there is concern for high bleeding risk. Medical therapy of mild-moderate in-stent restenosis involving SVG-OM and mid/distal RCA. Aggressive secondary prevention of coronary artery disease. Anticipate same-day discharge if no post-PCI complications occur during 6 hour monitoring period.   Past Medical History:  Diagnosis Date   Age-related macular degeneration, dry, both eyes    Anxiety    Aortic stenosis    Arthritis    probably all over (06/08/2016)   Cardiomyopathy (HCC)    a. EF 40-45% in 2018, normalized in 03/2020.   Chronic back pain    started in my lower back; going up my back in the last couple months (06/08/2016)   Chronic sinusitis    Coronary artery disease    a. s/p CABGx 2V (2014 in [place])  b. PCI (2015 in [place]). c. PCI 2018 ([facility]). d. PCI 03/2020 (complex - [facility]).   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 06/09/2016   NSTEMI (non-ST elevated myocardial infarction) (HCC) 06/08/2016   Pneumonia    3-4 times maybe (06/08/2016)   PVD (peripheral vascular disease) (HCC)    Recovering alcoholic (HCC)    picked up my 30 year chip the other day (06/08/2016)   S/P angioplasty with stent, 06/08/16 DES, for in-stent restenosis in  RCA. 06/09/2016   Sleep apnea    got a mask after OR; don't use it (06/08/2016)   Type II diabetes mellitus (HCC)     Past Surgical History:  Procedure Laterality Date   ANTERIOR CERVICAL DECOMP/DISCECTOMY FUSION     BACK SURGERY     CARDIAC CATHETERIZATION  03/2012   led to bypass   CAROTID ENDARTERECTOMY Right ~ 2014   COLONOSCOPY W/ BIOPSIES AND POLYPECTOMY     CORONARY ANGIOPLASTY WITH STENT PLACEMENT  2015; 06/08/2016   CORONARY ARTERY BYPASS GRAFT  04/10/2012   CORONARY ATHERECTOMY N/A 04/04/2020   Procedure: CORONARY ATHERECTOMY;  Surgeon: Burnard Debby LABOR, MD;  Location: [facility];  Service: Cardiovascular;  Laterality: N/A;   CORONARY STENT INTERVENTION N/A 06/08/2016   Procedure: Coronary Stent Intervention;  Surgeon: Debby LABOR Burnard, MD;  Location: [facility];  Service: Cardiovascular;  Laterality: N/A;   CORONARY STENT INTERVENTION N/A 04/02/2020   Procedure: CORONARY STENT INTERVENTION;  Surgeon: Anner Alm ORN, MD;  Location: [facility] INVASIVE CV LAB;  Service: Cardiovascular;  Laterality: N/A;   CORONARY STENT INTERVENTION N/A 04/04/2020   Procedure: CORONARY STENT INTERVENTION;  Surgeon: Burnard Debby LABOR, MD;  Location: [facility];  Service: Cardiovascular;  Laterality: N/A;   CORONARY STENT INTERVENTION N/A 11/16/2021   Procedure: CORONARY STENT INTERVENTION;  Surgeon: Mady Bruckner, MD;  Location: [facility];  Service: Cardiovascular;  Laterality: N/A;   CORONARY ULTRASOUND/IVUS N/A 04/04/2020   Procedure: Intravascular Ultrasound/IVUS;  Surgeon: Burnard Debby LABOR, MD;  Location: [facility] INVASIVE CV LAB;  Service: Cardiovascular;  Laterality: N/A;   JOINT REPLACEMENT     LEFT HEART CATH AND CORONARY ANGIOGRAPHY N/A 06/08/2016   Procedure: Left Heart Cath and Coronary Angiography;  Surgeon: Debby LABOR Burnard, MD;  Location: [facility];  Service: Cardiovascular;  Laterality: N/A;   LEFT HEART CATH AND CORS/GRAFTS ANGIOGRAPHY N/A 04/02/2020    Procedure: LEFT HEART CATH AND CORS/GRAFTS ANGIOGRAPHY;  Surgeon: Anner Alm ORN, MD;  Location: [facility] INVASIVE CV LAB;  Service: Cardiovascular;  Laterality: N/A;   LEFT HEART CATH AND CORS/GRAFTS ANGIOGRAPHY N/A 11/16/2021   Procedure: LEFT HEART CATH AND CORS/GRAFTS ANGIOGRAPHY;  Surgeon: Mady Bruckner, MD;  Location: [facility];  Service: Cardiovascular;  Laterality: N/A;   REVISION TOTAL HIP ARTHROPLASTY Left ~ 2004   TEMPORARY PACEMAKER N/A 04/04/2020   Procedure: TEMPORARY PACEMAKER;  Surgeon: Burnard Debby LABOR, MD;  Location: [facility];  Service: Cardiovascular;  Laterality: N/A;   TONSILLECTOMY     TOTAL HIP ARTHROPLASTY Left 1990s     Home Medications:  Prior to Admission medications   Medication Sig Start Date End Date Taking? Authorizing Provider  acetaminophen  (TYLENOL ) 325 MG tablet Take 2 tablets (650 mg total) by mouth every 4 (four) hours as needed for headache or mild pain. 06/09/16   Marylu Leita SAUNDERS, NP  Alpha-Lipoic Acid 200 MG CAPS Take 200 mg by mouth daily.    [provider]  APPLE CIDER VINEGAR PO Take 450 mg by mouth daily.    [provider]  aspirin  EC 81 MG tablet Take 81 mg by mouth daily.    [provider]  Bacillus Coagulans-Inulin (PROBIOTIC-PREBIOTIC PO) Take 1 tablet by mouth 3 (three) times daily. Bio Complete 3    [provider]  cephALEXin  (KEFLEX ) 500 MG capsule Take 1 capsule (500 mg total) by mouth 3 (three) times daily. 03/30/22   Horton, Charmaine FALCON, MD  cholecalciferol (VITAMIN D) 25 MCG (1000 UNIT) tablet Take 1,000 Units by mouth daily.    [provider]  citalopram  (CELEXA ) 40 MG tablet Take 20 mg by mouth daily.     [provider]  Coenzyme Q10 200 MG capsule Take 200 mg by mouth daily.    [provider]  Cyanocobalamin (VITAMIN B12 PO) Take 3,000 mcg by mouth daily.    [provider]  diclofenac Sodium (VOLTAREN) 1 % GEL as needed for pain. 02/02/22    [provider]  doxycycline  (MONODOX ) 50 MG capsule Take 50 mg by mouth daily.    [provider]  empagliflozin (JARDIANCE) 25 MG TABS tablet Take 25 mg by mouth daily.    [provider]  fluocinonide cream (LIDEX) 0.05 % as needed for irritation. 05/04/06   [provider]  furosemide (LASIX) 20 MG tablet Take 20 mg by mouth as needed (leg swelling).    [provider]  Ivermectin 1 % CREA as needed for irritation. 02/23/21   [provider]  ketoconazole (NIZORAL) 2 %  cream daily. 02/23/21   [provider]  levOCARNitine (L-CARNITINE PO) Take 1 tablet by mouth daily.    [provider]  levothyroxine  (SYNTHROID ) 75 MCG tablet daily. 02/02/22   [provider]  magnesium oxide (MAG-OX) 400 (240 Mg) MG tablet Take 400 mg by mouth daily.    [provider]  metFORMIN  (GLUCOPHAGE ) 500 MG tablet Take 1 tablet by mouth 2 (two) times daily. 05/04/06   [provider]  metoprolol  (LOPRESSOR ) 50 MG tablet Take 25 mg by mouth 2 (two) times daily.     [provider]  milk thistle 175 MG tablet Take 175 mg by mouth daily.    [provider]  Multiple Vitamins-Minerals (HAIR/SKIN/NAILS) CAPS Take 5,000 mcg by mouth daily.    [provider]  Multiple Vitamins-Minerals (PRESERVISION AREDS PO) Take 1 tablet by mouth 2 (two) times daily.    [provider]  nitroGLYCERIN  (NITROSTAT ) 0.4 MG SL tablet Place 0.4 mg under the tongue every 5 (five) minutes as needed for chest pain.    [provider]  OVER THE COUNTER MEDICATION Take 1 capsule by mouth 3 (three) times daily. VisiUltra    [provider]  OVER THE COUNTER MEDICATION Take 1 Capful by mouth 3 (three) times daily. Juvenon    [provider]  OVER THE COUNTER MEDICATION Take 1 Capful by mouth 2 (two) times daily. Nerve Control 911    [provider]  OVER THE COUNTER MEDICATION Take 1  capsule by mouth daily. Blood bo0st formulia    [provider]  OVER THE COUNTER MEDICATION Take 1 capsule by mouth daily. Glucofort    [provider]  OVER THE COUNTER MEDICATION Take 3 capsules by mouth daily. Balance of nature  Fruits and    [provider]  OVER THE COUNTER MEDICATION Take 3 capsules by mouth daily. Balance of [employer], Historical, MD  OVER THE COUNTER MEDICATION Take 1 Scoop by mouth daily. patriot power greens mix with water    [provider]  OVER THE COUNTER MEDICATION Take 0.5 Scoops by mouth daily. Vitalreds Fruit mix with water    [provider]  polycarbophil (FIBERCON) 625 MG tablet Take 625 mg by mouth daily.    [provider]  Quercetin 500 MG CAPS Take 500 mg by mouth daily.    [provider]  rosuvastatin  (CRESTOR ) 40 MG tablet Take 1 tablet (40 mg total) by mouth daily. 04/05/20   Dunn, Dayna N, PA-C  Saw Palmetto 450 MG CAPS Take 450 mg by mouth daily.    [provider]  selenium (SELENIMIN-200) 200 MCG TABS tablet Take 200 mcg by mouth daily.    [provider]  TART CHERRY PO Take 1,200 mg by mouth daily. Extract    [provider]  ticagrelor  (BRILINTA ) 90 MG TABS tablet Take 1 tablet (90 mg total) by mouth 2 (two) times daily. 04/05/20   Dunn, Dayna N, PA-C  Turmeric Curcumin 500 MG CAPS Take 500 mg by mouth daily.    [provider]    Scheduled Meds:  Continuous Infusions:  PRN Meds:   Allergies:    Allergies  Allergen Reactions   Tetracyclines & Related Hives and Itching   Lisinopril  Cough   Losartan Cough   Lipitor [Atorvastatin] Other (See Comments)    Sore muscles    Social History:   Social History   Socioeconomic History   Marital status: Divorced  Spouse name: Not on file   Number of children: 2   Years of education: Not on file   Highest education level: GED or equivalent  Occupational History    Occupation: Retired  Tobacco Use   Smoking status: Former    Current packs/day: 0.00    Average packs/day: 1 pack/day for 40.0 years (40.0 ttl pk-yrs)    Types: Cigarettes    Start date: 07/17/1968    Quit date: 07/17/2008    Years since quitting: 15.2   Smokeless tobacco: Never  Substance and Sexual Activity   Alcohol use: No    Comment: recovering alcoholic, abstinence 30 years.  attends AA weekly   Drug use: No   Sexual activity: Never  Other Topics Concern   Not on file  Social History Narrative   Not on file   Social Drivers of Health   Financial Resource Strain: Not on file  Food Insecurity: No Food Insecurity (12/29/2021)   Hunger Vital Sign    Worried About Running Out of Food in the Last Year: Never true    Ran Out of Food in the Last Year: Never true  Transportation Needs: No Transportation Needs (12/29/2021)   PRAPARE - Administrator, Civil Service (Medical): No    Lack of Transportation (Non-Medical): No  Physical Activity: Not on file  Stress: Not on file  Social Connections: Unknown (08/28/2021)   [facility]   Social Network    Social Network: Not on file  Intimate Partner Violence: Unknown (07/27/2021)   Received from [facility]   HITS    Physically Hurt: Not on file    Insult or Talk Down To: Not on file    Threaten Physical Harm: Not on file    Scream or Curse: Not on file    Family History:    Family History  Problem Relation Age of Onset   Cancer Mother      ROS:  Please see the history of present illness.   All other ROS reviewed and negative.     Physical Exam/Data: Vitals:   10/23/23 1549 10/23/23 1559  BP: 111/69   Pulse: 94   Resp: 19   Temp: 98.2 F (36.8 C)   TempSrc: Oral   SpO2: 99%   Weight:  65.2 kg  Height:  5' 6 (1.676 m)   No intake or output data in the 24 hours ending 10/23/23 1838    10/23/2023    3:59 PM 03/29/2022    7:56 PM 02/09/2022   11:35 AM  Last 3 Weights  Weight (lbs) 143 lb  12.8 oz 168 lb 170 lb  Weight (kg) 65.227 kg 76.204 kg 77.111 kg     Body mass index is 23.21 kg/m.  General:  Well nourished, well developed, in no acute distress HEENT: normal Neck: no JVD Vascular: No carotid bruits; Distal pulses 2+ bilaterally Cardiac:  normal S1, preserved S2; RRR; Ejection systolic murmur Lungs:  clear to auscultation bilaterally, no wheezing, rhonchi or rales  Abd: soft, nontender, no hepatomegaly  Ext: no edema Musculoskeletal:  No deformities, BUE and BLE strength normal and equal Skin: warm and dry  Neuro:  CNs 2-12 intact, no focal abnormalities noted Psych:  Normal affect    Laboratory Data: High Sensitivity Troponin:   Recent Labs  Lab 10/23/23 1620  TROPONINIHS 525*     Chemistry Recent Labs  Lab 10/23/23 1620  NA 136  K 4.1  CL 103  CO2 21*  GLUCOSE 177*  BUN 18  CREATININE 1.31*  CALCIUM  9.0  GFRNONAA 55*  ANIONGAP 12    No results for input(s): PROT, ALBUMIN, AST, ALT, ALKPHOS, BILITOT in the last 168 hours. Lipids No results for input(s): CHOL, TRIG, HDL, LABVLDL, LDLCALC, CHOLHDL in the last 168 hours.  Hematology Recent Labs  Lab 10/23/23 1620  WBC 8.1  RBC 4.10*  HGB 11.5*  HCT 35.6*  MCV 86.8  MCH 28.0  MCHC 32.3  RDW 12.2  PLT 429*   Thyroid No results for input(s): TSH, FREET4 in the last 168 hours.  BNPNo results for input(s): BNP, PROBNP in the last 168 hours.  DDimer No results for input(s): DDIMER in the last 168 hours.  Radiology/Studies:  No results found.   Assessment and Plan: Chest pain/exertional dyspnea/NSTEMI Suspect shortness of breath and exertional dyspnea is probably from moderate to severe aortic stenosis Moderate aortic stenosis 2023 cath with peak to peak gradient of 20 mmHg CAD status post CABG x 2 in 2014 with LIMA to LAD, SVG to circumflex/ OM Last cath 2023 severe native LAD with 95% distal left main disease extending to LAD and LCx, CTO of proximal  to mid LAD, widely patent LIMA to LAD, patent SVG to OM with 50% ostial and 25% proximal in-stent restenosis and status post midportion of SVG to OM PCI with DES.,  Patent RCA stents Ischemic cardiomyopathy, improved EF 2021 Multiple other comorbidities including hypertension, hyperlipidemia, hypothyroidism, history of DVT, former tobacco use   Plan: -> Continue heparin  GT for now T, continue to trend troponin and EKG Patient has been having a chest pain syndrome since 6 months and has been taking a lot of nitro switch to Imdur, now with worsening exertional dyspnea which could be secondary to progressive angina versus moderate to severe AS likely Continue home medication: aspirin  plus Brilinta , metoprolol  50 mg twice daily, rosuvastatin .  Hold Imdur, Jardiance Obtain echocardiogram in AM.  He had moderate aortic stenosis 2023 suspect this might have progressed leading to his symptoms along with coronary artery disease N.p.o. after midnight for cath left and right in a.m.  Follow-up CT PE study He has significant sciatica, fell few weeks ago, and other chronic medication and medical problem management per primary hospital team    Risk Assessment/Risk Scores:    TIMI Risk Score for Unstable Angina or Non-ST Elevation MI:   The patient's TIMI risk score is 6, which indicates a 41% risk of all cause mortality, new or recurrent myocardial infarction or need for urgent revascularization in the next 14 days.         For questions or updates, please contact [facility] Please consult [email or web address] for contact info under    Signed, Grayce Bold, MD  10/23/2023 6:38 PM

## 2023-10-23 NOTE — Progress Notes (Signed)
 PHARMACY - ANTICOAGULATION CONSULT NOTE  Pharmacy Consult for IV heparin   Indication: chest pain/ACS  Allergies  Allergen Reactions   Tetracyclines & Related Hives and Itching   Lisinopril  Cough   Losartan Cough   Lipitor [Atorvastatin] Other (See Comments)    Sore muscles    Patient Measurements: Height: 5' 6 (167.6 cm) Weight: 65.2 kg (143 lb 12.8 oz) IBW/kg (Calculated) : 63.8 HEPARIN  DW (KG): 65.2  Vital Signs: Temp: 98.2 F (36.8 C) (06/29 1549) Temp Source: Oral (06/29 1549) BP: 125/83 (06/29 1845) Pulse Rate: 97 (06/29 1845)  Labs: Recent Labs    10/23/23 1620 10/23/23 1753  HGB 11.5*  --   HCT 35.6*  --   PLT 429*  --   CREATININE 1.31*  --   TROPONINIHS 525* 520*    Estimated Creatinine Clearance: 41.3 mL/min (A) (by C-G formula based on SCr of 1.31 mg/dL (H)).   Medical History: Past Medical History:  Diagnosis Date   Age-related macular degeneration, dry, both eyes    Anxiety    Aortic stenosis    Arthritis    probably all over (06/08/2016)   Cardiomyopathy (HCC)    a. EF 40-45% in 2018, normalized in 03/2020.   Chronic back pain    started in my lower back; going up my back in the last couple months (06/08/2016)   Chronic sinusitis    Coronary artery disease    a. s/p CABGx 2V (2014 in [place])  b. PCI (2015 in [place]). c. PCI 2018 ([facility]). d. PCI 03/2020 (complex - [facility]).   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 06/09/2016   NSTEMI (non-ST elevated myocardial infarction) (HCC) 06/08/2016   Pneumonia    3-4 times maybe (06/08/2016)   PVD (peripheral vascular disease)  (HCC)    Recovering alcoholic (HCC)    picked up my 30 year chip the other day (06/08/2016)   S/P angioplasty with stent, 06/08/16 DES, for in-stent restenosis in RCA. 06/09/2016   Sleep apnea    got a mask after OR; don't use it (06/08/2016)   Type II diabetes mellitus (HCC)    Assessment: Luke Harvey is a 78 y.o. year old male admitted on 10/23/2023 with concern for ACS. No anticoagulation prior to admission per dispense history. Pharmacy consulted to dose heparin .  Goal of Therapy:  Heparin  level 0.3-0.7 units/ml Monitor platelets by anticoagulation protocol: Yes   Plan:  Heparin  3900 units x 1 as bolus followed by heparin  infusion at 750 units/hr 8h heparin  level  Daily heparin  level, CBC, and monitoring for bleeding F/u plans for anticoagulation and cards recs  Thank you for allowing pharmacy to participate in this patient's care.  Leonor GORMAN Bash, PharmD Emergency Medicine Clinical Pharmacist 10/23/2023,7:17 PM

## 2023-10-23 NOTE — ED Notes (Signed)
 CCMD called, pt on monitor

## 2023-10-24 ENCOUNTER — Encounter (HOSPITAL_COMMUNITY)
Admission: EM | Disposition: A | Discharge status: Home Health | Payer: Self-pay | Source: Home / Self Care | Attending: Internal Medicine

## 2023-10-24 ENCOUNTER — Encounter (HOSPITAL_COMMUNITY): Payer: Self-pay | Admitting: Cardiology

## 2023-10-24 DIAGNOSIS — E1159 Type 2 diabetes mellitus with other circulatory complications: Secondary | ICD-10-CM | POA: Diagnosis not present

## 2023-10-24 DIAGNOSIS — E782 Mixed hyperlipidemia: Secondary | ICD-10-CM

## 2023-10-24 DIAGNOSIS — I25710 Atherosclerosis of autologous vein coronary artery bypass graft(s) with unstable angina pectoris: Secondary | ICD-10-CM

## 2023-10-24 DIAGNOSIS — I5032 Chronic diastolic (congestive) heart failure: Secondary | ICD-10-CM | POA: Diagnosis not present

## 2023-10-24 DIAGNOSIS — I214 Non-ST elevation (NSTEMI) myocardial infarction: Principal | ICD-10-CM

## 2023-10-24 DIAGNOSIS — I350 Nonrheumatic aortic (valve) stenosis: Secondary | ICD-10-CM | POA: Diagnosis not present

## 2023-10-24 DIAGNOSIS — I739 Peripheral vascular disease, unspecified: Secondary | ICD-10-CM

## 2023-10-24 DIAGNOSIS — I25110 Atherosclerotic heart disease of native coronary artery with unstable angina pectoris: Secondary | ICD-10-CM

## 2023-10-24 LAB — POCT I-STAT EG7
Acid-base deficit: 7 mmol/L — ABNORMAL HIGH (ref 0.0–2.0)
Acid-base deficit: 7 mmol/L — ABNORMAL HIGH (ref 0.0–2.0)
Bicarbonate: 16.9 mmol/L — ABNORMAL LOW (ref 20.0–28.0)
Bicarbonate: 17.3 mmol/L — ABNORMAL LOW (ref 20.0–28.0)
Calcium, Ion: 1.16 mmol/L (ref 1.15–1.40)
Calcium, Ion: 1.19 mmol/L (ref 1.15–1.40)
HCT: 33 % — ABNORMAL LOW (ref 39.0–52.0)
HCT: 34 % — ABNORMAL LOW (ref 39.0–52.0)
Hemoglobin: 11.2 g/dL — ABNORMAL LOW (ref 13.0–17.0)
Hemoglobin: 11.6 g/dL — ABNORMAL LOW (ref 13.0–17.0)
O2 Saturation: 59 %
O2 Saturation: 63 %
Potassium: 3.7 mmol/L (ref 3.5–5.1)
Potassium: 3.9 mmol/L (ref 3.5–5.1)
Sodium: 131 mmol/L — ABNORMAL LOW (ref 135–145)
Sodium: 135 mmol/L (ref 135–145)
TCO2: 18 mmol/L — ABNORMAL LOW (ref 22–32)
TCO2: 18 mmol/L — ABNORMAL LOW (ref 22–32)
pCO2, Ven: 28.5 mmHg — ABNORMAL LOW (ref 44–60)
pCO2, Ven: 29.9 mmHg — ABNORMAL LOW (ref 44–60)
pH, Ven: 7.362 (ref 7.25–7.43)
pH, Ven: 7.39 (ref 7.25–7.43)
pO2, Ven: 32 mmHg (ref 32–45)
pO2, Ven: 32 mmHg (ref 32–45)

## 2023-10-24 LAB — CBC
HCT: 37.3 % — ABNORMAL LOW (ref 39.0–52.0)
Hemoglobin: 12.2 g/dL — ABNORMAL LOW (ref 13.0–17.0)
MCH: 28.4 pg (ref 26.0–34.0)
MCHC: 32.7 g/dL (ref 30.0–36.0)
MCV: 86.7 fL (ref 80.0–100.0)
Platelets: 382 10*3/uL (ref 150–400)
RBC: 4.3 MIL/uL (ref 4.22–5.81)
RDW: 12.4 % (ref 11.5–15.5)
WBC: 8.3 10*3/uL (ref 4.0–10.5)
nRBC: 0 % (ref 0.0–0.2)

## 2023-10-24 LAB — POCT I-STAT 7, (LYTES, BLD GAS, ICA,H+H)
Acid-base deficit: 7 mmol/L — ABNORMAL HIGH (ref 0.0–2.0)
Bicarbonate: 14.8 mmol/L — ABNORMAL LOW (ref 20.0–28.0)
Calcium, Ion: 1.17 mmol/L (ref 1.15–1.40)
HCT: 34 % — ABNORMAL LOW (ref 39.0–52.0)
Hemoglobin: 11.6 g/dL — ABNORMAL LOW (ref 13.0–17.0)
O2 Saturation: 98 %
Potassium: 3.9 mmol/L (ref 3.5–5.1)
Sodium: 137 mmol/L (ref 135–145)
TCO2: 15 mmol/L — ABNORMAL LOW (ref 22–32)
pCO2 arterial: 21.4 mmHg — ABNORMAL LOW (ref 32–48)
pH, Arterial: 7.448 (ref 7.35–7.45)
pO2, Arterial: 94 mmHg (ref 83–108)

## 2023-10-24 LAB — HEPARIN LEVEL (UNFRACTIONATED)
Heparin Unfractionated: 0.13 [IU]/mL — ABNORMAL LOW (ref 0.30–0.70)
Heparin Unfractionated: 0.13 [IU]/mL — ABNORMAL LOW (ref 0.30–0.70)

## 2023-10-24 LAB — POCT ACTIVATED CLOTTING TIME
Activated Clotting Time: 337 s
Activated Clotting Time: 510 s
Activated Clotting Time: 245 s

## 2023-10-24 SURGERY — RIGHT/LEFT HEART CATH AND CORONARY ANGIOGRAPHY
Anesthesia: LOCAL

## 2023-10-24 MED ORDER — ASPIRIN 81 MG PO CHEW
81.0000 mg | CHEWABLE_TABLET | ORAL | Status: AC
Start: 2023-10-25 — End: 2023-10-25
  Filled 2023-10-24: qty 1

## 2023-10-24 MED ORDER — FENTANYL CITRATE (PF) 100 MCG/2ML IJ SOLN
INTRAMUSCULAR | Status: AC
Start: 2023-10-24 — End: 2023-10-24
  Filled 2023-10-24: qty 2

## 2023-10-24 MED ORDER — NITROGLYCERIN 0.4 MG SL SUBL
SUBLINGUAL_TABLET | SUBLINGUAL | Status: DC | PRN
  Administered 2023-10-24: .4 mg via SUBLINGUAL

## 2023-10-24 MED ORDER — HEPARIN BOLUS VIA INFUSION
1500.0000 [IU] | Freq: Once | INTRAVENOUS | Status: AC
  Administered 2023-10-24: 1500 [IU] via INTRAVENOUS
  Filled 2023-10-24: qty 1500

## 2023-10-24 MED ORDER — SODIUM CHLORIDE 0.9 % IV SOLN: 250.0000 mL | INTRAVENOUS | Status: AC | PRN

## 2023-10-24 MED ORDER — LIDOCAINE HCL (PF) 1 % IJ SOLN
INTRAMUSCULAR | Status: AC
  Filled 2023-10-24: qty 30

## 2023-10-24 MED ORDER — SODIUM CHLORIDE 0.9% FLUSH: 3.0000 mL | INTRAVENOUS | Status: DC | PRN

## 2023-10-24 MED ORDER — MIDAZOLAM HCL 2 MG/2ML IJ SOLN
INTRAMUSCULAR | Status: DC | PRN
  Administered 2023-10-24 (×3): 1 mg via INTRAVENOUS

## 2023-10-24 MED ORDER — HEPARIN (PORCINE) IN NACL 1000-0.9 UT/500ML-% IV SOLN
INTRAVENOUS | Status: DC | PRN
Start: 2023-10-24 — End: 2023-10-24
  Administered 2023-10-24: 500 mL

## 2023-10-24 MED ORDER — FENTANYL CITRATE (PF) 100 MCG/2ML IJ SOLN
INTRAMUSCULAR | Status: DC | PRN
  Administered 2023-10-24 (×3): 25 ug via INTRAVENOUS

## 2023-10-24 MED ORDER — SODIUM CHLORIDE 0.9 % IV SOLN: INTRAVENOUS | Status: DC

## 2023-10-24 MED ORDER — LABETALOL HCL 5 MG/ML IV SOLN: 10.0000 mg | INTRAVENOUS | Status: AC | PRN

## 2023-10-24 MED ORDER — SODIUM CHLORIDE 0.9 % IV SOLN: INTRAVENOUS | Status: AC

## 2023-10-24 MED ORDER — SODIUM CHLORIDE 0.9 % IV SOLN
INTRAVENOUS | Status: AC | PRN
  Administered 2023-10-24: 250 mL via INTRAVENOUS

## 2023-10-24 MED ORDER — IOHEXOL 350 MG/ML SOLN
INTRAVENOUS | Status: DC | PRN
  Administered 2023-10-24: 130 mL

## 2023-10-24 MED ORDER — HEPARIN SODIUM (PORCINE) 1000 UNIT/ML IJ SOLN
INTRAMUSCULAR | Status: DC | PRN
  Administered 2023-10-24: 3500 [IU] via INTRAVENOUS

## 2023-10-24 MED ORDER — HYDRALAZINE HCL 20 MG/ML IJ SOLN: 10.0000 mg | INTRAMUSCULAR | Status: AC | PRN

## 2023-10-24 MED ORDER — HEPARIN (PORCINE) IN NACL 1000-0.9 UT/500ML-% IV SOLN
INTRAVENOUS | Status: DC | PRN
  Administered 2023-10-24: 500 mL

## 2023-10-24 MED ORDER — HEPARIN BOLUS VIA INFUSION
2000.0000 [IU] | Freq: Once | INTRAVENOUS | Status: AC
  Administered 2023-10-24: 2000 [IU] via INTRAVENOUS
  Filled 2023-10-24: qty 2000

## 2023-10-24 MED ORDER — NITROGLYCERIN 0.4 MG SL SUBL
SUBLINGUAL_TABLET | SUBLINGUAL | Status: AC
  Filled 2023-10-24: qty 1

## 2023-10-24 MED ORDER — VERAPAMIL HCL 2.5 MG/ML IV SOLN
INTRAVENOUS | Status: AC
  Filled 2023-10-24: qty 2

## 2023-10-24 MED ORDER — MIDAZOLAM HCL 2 MG/2ML IJ SOLN
INTRAMUSCULAR | Status: AC
  Filled 2023-10-24: qty 2

## 2023-10-24 MED ORDER — SODIUM CHLORIDE 0.9% FLUSH
3.0000 mL | Freq: Two times a day (BID) | INTRAVENOUS | Status: DC
  Administered 2023-10-25 – 2023-10-26 (×3): 3 mL via INTRAVENOUS

## 2023-10-24 MED ORDER — LIDOCAINE HCL (PF) 1 % IJ SOLN
INTRAMUSCULAR | Status: DC | PRN
  Administered 2023-10-24: 5 mL

## 2023-10-24 MED ORDER — HEPARIN SODIUM (PORCINE) 1000 UNIT/ML IJ SOLN
INTRAMUSCULAR | Status: AC
  Filled 2023-10-24: qty 10

## 2023-10-24 SURGICAL SUPPLY — 20 items
BALLOON EMERGE MR 2.5X30 (BALLOONS) IMPLANT
BALLOON SAPPHIRE 2.5X12 (BALLOONS) IMPLANT
BALLOON SCOREFLEX 3.0X10 (BALLOONS) IMPLANT
CATH BALLN WEDGE 5F 110CM (CATHETERS) IMPLANT
CATH INFINITI 5 FR AR1 MOD (CATHETERS) IMPLANT
CATH INFINITI 5FR AL1 (CATHETERS) IMPLANT
CATH INFINITI 5FR MULTPACK ANG (CATHETERS) IMPLANT
CATH LAUNCHER 6FR AL1 (CATHETERS) IMPLANT
DEVICE RAD COMP TR BAND LRG (VASCULAR PRODUCTS) IMPLANT
ELECT DEFIB PAD ADLT CADENCE (PAD) IMPLANT
GLIDESHEATH SLEND SS 6F .021 (SHEATH) IMPLANT
GUIDEWIRE INQWIRE 1.5J.035X260 (WIRE) IMPLANT
KIT ENCORE 26 ADVANTAGE (KITS) IMPLANT
PACK CARDIAC CATHETERIZATION (CUSTOM PROCEDURE TRAY) ×2 IMPLANT
SET ATX-X65L (MISCELLANEOUS) IMPLANT
SHEATH GLIDE SLENDER 4/5FR (SHEATH) IMPLANT
SHEATH PROBE COVER 6X72 (BAG) IMPLANT
STENT SYNERGY XD 3.0X24 (Permanent Stent) IMPLANT
STENT SYNERGY XD 3.0X32 (Permanent Stent) IMPLANT
WIRE ASAHI PROWATER 180CM (WIRE) IMPLANT

## 2023-10-24 NOTE — Hospital Course (Signed)
 78 y.o. male with medical history significant of coronary artery disease status post CABG, multiple PCI, ischemic cardiomyopathy with EF of 40 to 45%, essential hypertension, hyperlipidemia, type 2 diabetes, aortic stenosis, DVT, osteoarthritis, who presented with chest pain on and off over the last 6 months.    Assessment and Plan:   Atypical chest pain with NSTEMI - Concerning for ACS given patient's noted cardiac history and worsening chest pain improved with nitro. Cardiology consulted and following closely.  Cardiac catheterization 6/30 noting severe disease, s/p PCI of vein graft.  ECHO pending to evaluate valve function as well.  Continues on DAPT, statin, BB.   Ischemic cardiomyopathy/HFrEF - Does not appear to be in acute exacerbation.  Previous EF 40-45%.  Repeat echo pending.   CAD - S/p CABG and PCI, ischemic cardiomyopathy.  Plan as above.   Diabetes mellitus - Insulin  sliding scale on board.   Peripheral vascular disease - DAPT/heparin  on board for above.  Monitor closely.   Physical debilitation muscle weakness - Noted weakness and dyspnea on exertion.  Likely evaluated by underlying heart disease.  Will order PT/OT.  Evaluate whether patient would benefit from home health versus STR.

## 2023-10-24 NOTE — Progress Notes (Signed)
 Rounding Note   Patient Name: Luke Harvey Date of Encounter: 10/24/2023  [facility] Cardiologist: Candyce Reek, MD   Subjective Denies any chest pain currently but reports chest pain/dyspnea with minimal exertion  Scheduled Meds:  aspirin  EC  81 mg Oral Daily   metoprolol  tartrate  25 mg Oral BID   rosuvastatin   40 mg Oral Daily   ticagrelor   90 mg Oral BID   Continuous Infusions:  heparin  850 Units/hr (10/24/23 0356)   PRN Meds: acetaminophen , ondansetron  (ZOFRAN ) IV   Vital Signs  Vitals:   10/24/23 0400 10/24/23 0530 10/24/23 0545 10/24/23 0700  BP: 129/87 (!) 140/83  129/83  Pulse: 100 (!) 102 (!) 101 99  Resp: (!) 32 (!) 27 (!) 25 16  Temp: 98 F (36.7 C)     TempSrc: Oral     SpO2: 97% 96% 99% 100%  Weight:      Height:       No intake or output data in the 24 hours ending 10/24/23 0758    10/23/2023    3:59 PM 03/29/2022    7:56 PM 02/09/2022   11:35 AM  Last 3 Weights  Weight (lbs) 143 lb 12.8 oz 168 lb 170 lb  Weight (kg) 65.227 kg 76.204 kg 77.111 kg      Telemetry Normal sinus rhythm- Personally Reviewed  ECG  Normal sinus rhythm, aVR elevation with diffuse ST depression- Personally Reviewed  Physical Exam  GEN: No acute distress.   Neck: No JVD Cardiac: RRR, 3/6 systolic murmur Respiratory: Clear to auscultation bilaterally. GI: Soft, nontender, non-distended  MS: No edema; No deformity. Neuro:  Nonfocal  Psych: Normal affect   Labs High Sensitivity Troponin:   Recent Labs  Lab 10/23/23 1620 10/23/23 1753  TROPONINIHS 525* 520*     Chemistry Recent Labs  Lab 10/23/23 1620 10/23/23 1731 10/23/23 1753  NA 136  --   --   K 4.1  --   --   CL 103  --   --   CO2 21*  --   --   GLUCOSE 177*  --   --   BUN 18  --   --   CREATININE 1.31*  --   --   CALCIUM  9.0  --   --   MG  --  2.2  --   PROT  --   --  6.6  ALBUMIN  --   --  2.8*  AST  --   --  28  ALT  --   --  18  ALKPHOS  --   --  71  BILITOT  --    --  0.5  GFRNONAA 55*  --   --   ANIONGAP 12  --   --     Lipids No results for input(s): CHOL, TRIG, HDL, LABVLDL, LDLCALC, CHOLHDL in the last 168 hours.  Hematology Recent Labs  Lab 10/23/23 1620 10/24/23 0257  WBC 8.1 8.3  RBC 4.10* 4.30  HGB 11.5* 12.2*  HCT 35.6* 37.3*  MCV 86.8 86.7  MCH 28.0 28.4  MCHC 32.3 32.7  RDW 12.2 12.4  PLT 429* 382   Thyroid  Recent Labs  Lab 10/23/23 1800  TSH 1.848    BNP Recent Labs  Lab 10/23/23 1730  BNP 873.7*    DDimer No results for input(s): DDIMER in the last 168 hours.   Radiology  DG Chest Port 1 View Result Date: 10/23/2023 CLINICAL DATA:  Worsening angina with sciatica and  shortness of breath. EXAM: PORTABLE CHEST 1 VIEW COMPARISON:  March 29, 2022 FINDINGS: Multiple sternal wires and vascular clips are present. The heart size and mediastinal contours are within normal limits. A coronary artery stent is in place. Mild to moderate severity areas of scarring, atelectasis and/or infiltrate are seen within the bilateral lung bases. There is a small right pleural effusion. No pneumothorax is identified. Postoperative changes are seen involving the cervical spine. No acute osseous abnormalities are present. IMPRESSION: 1. Evidence of prior median sternotomy/CABG. 2. Mild to moderate severity bibasilar scarring, atelectasis and/or infiltrate. 3. Small right pleural effusion. Electronically Signed   By: Suzen Dials M.D.   On: 10/23/2023 19:46   CT Angio Chest PE W and/or Wo Contrast Result Date: 10/23/2023 CLINICAL DATA:  PE suspected. History of blood clots. Exertional shortness of breath and flushing. Possible superior vena cava syndrome. Two EXAM: CT ANGIOGRAPHY CHEST WITH CONTRAST TECHNIQUE: Multidetector CT imaging of the chest was performed using the standard protocol during bolus administration of intravenous contrast. Multiplanar CT image reconstructions and MIPs were obtained to evaluate the vascular  anatomy. RADIATION DOSE REDUCTION: This exam was performed according to the departmental dose-optimization program which includes automated exposure control, adjustment of the mA and/or kV according to patient size and/or use of iterative reconstruction technique. CONTRAST:  75mL OMNIPAQUE  IOHEXOL  350 MG/ML SOLN COMPARISON:  Chest radiograph 10/23/2023 and CT chest 03/30/2022 FINDINGS: Cardiovascular: Cardiomegaly. No pericardial effusion. Negative for pulmonary embolism. Coronary artery and aortic atherosclerotic calcification. Sternotomy and CABG. Right arm venous contrast injection flows through the SVC. No evidence of clot within the SVC noting mixing artifact and streak artifact from dense contrast. Mediastinum/Nodes: Trachea and esophagus are unremarkable. No thoracic adenopathy. Lungs/Pleura: Diffuse interlobular septal thickening and bronchial wall thickening greatest in the lower lungs. Scattered confluent subpleural opacities in the lower lungs. Small right and trace left pleural effusions. No pneumothorax. Upper Abdomen: No acute abnormality. Musculoskeletal: No acute fracture. Review of the MIP images confirms the above findings. IMPRESSION: 1. Negative for pulmonary embolism or SVC syndrome. 2. Cardiomegaly with pulmonary edema. Small right and trace left pleural effusions. 3. Aortic Atherosclerosis (ICD10-I70.0). Electronically Signed   By: Norman Gatlin M.D.   On: 10/23/2023 19:25    Cardiac Studies   Patient Profile   78 y.o. male with a hx of CAD status post CABG x 2 in 2014 with LIMA to LAD, SVG to circumflex/ OM status post stents to RCA, status post stent to SVG to OM in 2023, severe native vessel disease, ischemic cardiomyopathy, hypertension, hyperlipidemia, former smoker, history of DVT who is being seen 10/23/2023 for the evaluation of chest pain   Assessment & Plan  NSTEMI: History of CAD status post CABG x 2 in 2014 with LIMA to LAD, SVG to circumflex/ OM status post stents to  RCA, status post stent to SVG to OM in 2023.  Presented with chest pain and shortness of breath; reports no symptoms at rest but is symptomatic with minimal exertion. EKG with aVR elevation and diffuse ST depressions.  Troponin 525. - Continue aspirin , ticagrelor , heparin  gtt - Continue rosuvastatin  -Continue metoprolol  25 mg twice daily - Echocardiogram - Plan LHC/RHC today.  Risks and benefits of cardiac catheterization have been discussed with the patient.  These include bleeding, infection, kidney damage, stroke, heart attack, death.  The patient understands these risks and is willing to proceed.  Heart failure with recovered EF: EF 40 to 45% in 2018, normalized on echocardiogram 03/2020.  Appears euvolemic  on exam - Continue metoprolol  - Update echo  Aortic stenosis: Moderate AS by echo in 2021, will update echocardiogram  For questions or updates, please contact [facility] Please consult [email or web address] for contact info under     Signed, Lonni LITTIE Nanas, MD  10/24/2023, 7:58 AM

## 2023-10-24 NOTE — Progress Notes (Signed)
 Progress Note   Patient: Luke Harvey FMW:[id number] DOB: 08-Oct-1945 DOA: 10/23/2023  DOS: the patient was seen and examined on 10/24/2023   Brief hospital course:  78 y.o. male with medical history significant of coronary artery disease status post CABG, multiple PCI, ischemic cardiomyopathy with EF of 40 to 45%, essential hypertension, hyperlipidemia, type 2 diabetes, aortic stenosis, DVT, osteoarthritis, who presented with chest pain on and off over the last 6 months.   Assessment and Plan:  Atypical chest pain with NSTEMI - Concerning for ACS given patient's noted cardiac history and worsening chest pain improved with nitro.  Troponins elevated but flat so far.  ECG personally reviewed noting ST depressions particularly in the lateral leads but also inferiorly.  Cardiology consulted and following closely.  Cardiac catheterization planned for later this morning.  Continues on DAPT, statin, heparin  drip.  Ischemic cardiomyopathy/HFrEF - Does not appear to be in acute exacerbation.  Previous EF 40-45%.  CAD - S/p CABG and PCI, ischemic cardiomyopathy.  Plan as above.  Diabetes mellitus - Insulin  sliding scale on board.  Peripheral vascular disease - DAPT/heparin  on board for above.  Monitor closely.    Subjective: Patient resting comfortably this morning.  Still having some pain in chest pain, worse last night improved somewhat with nitro.  Was given supplemental oxygen which he believes is helping his chest pain.  Denies any frank shortness of breath, fevers, nausea, vomiting, abdominal pain.  Physical Exam:  Vitals:   10/24/23 0530 10/24/23 0545 10/24/23 0700 10/24/23 0915  BP: (!) 140/83  129/83 (!) 120/90  Pulse: (!) 102 (!) 101 99 95  Resp: (!) 27 (!) 25 16 14   Temp:      TempSrc:      SpO2: 96% 99% 100% 100%  Weight:      Height:        GENERAL:  Alert, pleasant, no acute distress  HEENT:  EOMI CARDIOVASCULAR:  RRR, no murmurs appreciated RESPIRATORY:  Clear  to auscultation, no wheezing, rales, or rhonchi GASTROINTESTINAL:  Soft, nontender, nondistended EXTREMITIES:  No LE edema bilaterally NEURO:  No new focal deficits appreciated SKIN:  No rashes noted PSYCH:  Appropriate mood and affect     Data Reviewed:  No new imaging to review  Previous records (including but not limited to H&P, progress notes, nursing notes, TOC management) were reviewed in assessment of this patient.  Labs: CBC: Recent Labs  Lab 10/23/23 1620 10/24/23 0257  WBC 8.1 8.3  HGB 11.5* 12.2*  HCT 35.6* 37.3*  MCV 86.8 86.7  PLT 429* 382   Basic Metabolic Panel: Recent Labs  Lab 10/23/23 1620 10/23/23 1731  NA 136  --   K 4.1  --   CL 103  --   CO2 21*  --   GLUCOSE 177*  --   BUN 18  --   CREATININE 1.31*  --   CALCIUM  9.0  --   MG  --  2.2   Liver Function Tests: Recent Labs  Lab 10/23/23 1753  AST 28  ALT 18  ALKPHOS 71  BILITOT 0.5  PROT 6.6  ALBUMIN 2.8*   CBG: No results for input(s): GLUCAP in the last 168 hours.  Scheduled Meds:  aspirin  EC  81 mg Oral Daily   metoprolol  tartrate  25 mg Oral BID   rosuvastatin   40 mg Oral Daily   ticagrelor   90 mg Oral BID   Continuous Infusions:  sodium chloride  10 mL/hr at 10/24/23 9073   heparin   850 Units/hr (10/24/23 0356)   PRN Meds:.acetaminophen , ondansetron  (ZOFRAN ) IV  Family Communication: None at bedside  Disposition: Status is: Inpatient Remains inpatient appropriate because: NSTEMI     Time spent: 40 minutes  Length of inpatient stay: 1 days  Author: Carliss LELON Canales, DO 10/24/2023 10:21 AM  For on call review [email or web address].

## 2023-10-24 NOTE — Interval H&P Note (Signed)
 History and Physical Interval Note:  10/24/2023 4:54 PM  Luke Harvey [facility]  has presented today for surgery, with the diagnosis of nstemi - aortic stenosis.  The various methods of treatment have been discussed with the patient and family. After consideration of risks, benefits and other options for treatment, the patient has consented to  Procedure(s): RIGHT/LEFT HEART CATH AND CORONARY ANGIOGRAPHY (N/A)  PERCUTANEOUS CORONARY INTERVENTION   as a surgical intervention.  The patient's history has been reviewed, patient examined, no change in status, stable for surgery.  I have reviewed the patient's chart and labs.  Questions were answered to the patient's satisfaction.     Cath Lab Visit (complete for each Cath Lab visit)  Clinical Evaluation Leading to the Procedure:   ACS: Yes.    Non-ACS:    Anginal Classification: CCS IV  Anti-ischemic medical therapy: Minimal Therapy (1 class of medications)  Non-Invasive Test Results: No non-invasive testing performed  Prior CABG: Previous CABG     Alm Clay

## 2023-10-24 NOTE — ED Notes (Signed)
 CCMD called to admit the patient for cardiac monitoring.

## 2023-10-24 NOTE — Progress Notes (Signed)
 PHARMACY - ANTICOAGULATION CONSULT NOTE  Pharmacy Consult for IV heparin   Indication: chest pain/ACS  Allergies  Allergen Reactions   Tetracyclines & Related Hives and Itching   Lisinopril  Cough   Losartan Cough   Lipitor [Atorvastatin] Other (See Comments)    Sore muscles    Patient Measurements: Height: 5' 6 (167.6 cm) Weight: 65.2 kg (143 lb 12.8 oz) IBW/kg (Calculated) : 63.8 HEPARIN  DW (KG): 65.2  Vital Signs: Temp: 98.2 F (36.8 C) (06/30 0100) Temp Source: Oral (06/30 0100) BP: 114/72 (06/30 0100) Pulse Rate: 90 (06/30 0100)  Labs: Recent Labs    10/23/23 1620 10/23/23 1753 10/24/23 0257  HGB 11.5*  --  12.2*  HCT 35.6*  --  37.3*  PLT 429*  --  382  HEPARINUNFRC  --   --  0.13*  CREATININE 1.31*  --   --   TROPONINIHS 525* 520*  --     Estimated Creatinine Clearance: 41.3 mL/min (A) (by C-G formula based on SCr of 1.31 mg/dL (H)).   Medical History: Past Medical History:  Diagnosis Date   Age-related macular degeneration, dry, both eyes    Anxiety    Aortic stenosis    Arthritis    probably all over (06/08/2016)   Cardiomyopathy (HCC)    a. EF 40-45% in 2018, normalized in 03/2020.   Chronic back pain    started in my lower back; going up my back in the last couple months (06/08/2016)   Chronic sinusitis    Coronary artery disease    a. s/p CABGx 2V (2014 in [place])  b. PCI (2015 in [place]). c. PCI 2018 ([facility]). d. PCI 03/2020 (complex - [facility]).   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 06/09/2016   NSTEMI (non-ST elevated myocardial infarction) (HCC) 06/08/2016   Pneumonia     3-4 times maybe (06/08/2016)   PVD (peripheral vascular disease) (HCC)    Recovering alcoholic (HCC)    picked up my 30 year chip the other day (06/08/2016)   S/P angioplasty with stent, 06/08/16 DES, for in-stent restenosis in RCA. 06/09/2016   Sleep apnea    got a mask after OR; don't use it (06/08/2016)   Type II diabetes mellitus (HCC)    Assessment: Luke Harvey is a 78 y.o. year old male admitted on 10/23/2023 with concern for ACS. No anticoagulation prior to admission per dispense history. Pharmacy consulted to dose heparin .  6/30 AM update:  Heparin  level sub-therapeutic   Goal of Therapy:  Heparin  level 0.3-0.7 units/ml Monitor platelets by anticoagulation protocol: Yes   Plan:  Heparin  1500 units re-bol[employer] heparin  to 850 units/hr Heparin  level in 8 hours  Lynwood Mckusick, PharmD, BCPS Clinical Pharmacist Phone: [phone]

## 2023-10-24 NOTE — Plan of Care (Signed)
  Problem: Education: Goal: Understanding of cardiac disease, CV risk reduction, and recovery process will improve Outcome: Progressing Goal: Individualized Educational Video(s) Outcome: Progressing   Problem: Activity: Goal: Ability to tolerate increased activity will improve Outcome: Progressing   Problem: Cardiac: Goal: Ability to achieve and maintain adequate cardiovascular perfusion will improve Outcome: Progressing   Problem: Health Behavior/Discharge Planning: Goal: Ability to safely manage health-related needs after discharge will improve Outcome: Progressing   Problem: Education: Goal: Knowledge of General Education information will improve Description: Including pain rating scale, medication(s)/side effects and non-pharmacologic comfort measures Outcome: Progressing   Problem: Health Behavior/Discharge Planning: Goal: Ability to manage health-related needs will improve Outcome: Progressing   Problem: Clinical Measurements: Goal: Ability to maintain clinical measurements within normal limits will improve Outcome: Progressing Goal: Will remain free from infection Outcome: Progressing Goal: Diagnostic test results will improve Outcome: Progressing Goal: Respiratory complications will improve Outcome: Progressing Goal: Cardiovascular complication will be avoided Outcome: Progressing   Problem: Activity: Goal: Risk for activity intolerance will decrease Outcome: Progressing   Problem: Nutrition: Goal: Adequate nutrition will be maintained Outcome: Progressing   Problem: Coping: Goal: Level of anxiety will decrease Outcome: Progressing   Problem: Elimination: Goal: Will not experience complications related to bowel motility Outcome: Progressing Goal: Will not experience complications related to urinary retention Outcome: Progressing   Problem: Pain Managment: Goal: General experience of comfort will improve and/or be controlled Outcome: Progressing    Problem: Safety: Goal: Ability to remain free from injury will improve Outcome: Progressing   Problem: Skin Integrity: Goal: Risk for impaired skin integrity will decrease Outcome: Progressing   Problem: Education: Goal: Understanding of CV disease, CV risk reduction, and recovery process will improve Outcome: Progressing Goal: Individualized Educational Video(s) Outcome: Progressing

## 2023-10-24 NOTE — Progress Notes (Signed)
 PHARMACY - ANTICOAGULATION CONSULT NOTE  Pharmacy Consult for IV heparin   Indication: chest pain/ACS  Allergies  Allergen Reactions   Tetracyclines & Related Hives and Itching   Cozaar [Losartan] Cough   Zestril  [Lisinopril ] Cough   Lipitor [Atorvastatin] Other (See Comments)    Myalgias     Patient Measurements: Height: 5' 6 (167.6 cm) Weight: 65.2 kg (143 lb 12.8 oz) IBW/kg (Calculated) : 63.8 HEPARIN  DW (KG): 65.2  Vital Signs: Temp: 98 F (36.7 C) (06/30 0400) Temp Source: Oral (06/30 0400) BP: 129/82 (06/30 1045) Pulse Rate: 92 (06/30 1045)  Labs: Recent Labs    10/23/23 1620 10/23/23 1753 10/24/23 0257 10/24/23 1200  HGB 11.5*  --  12.2*  --   HCT 35.6*  --  37.3*  --   PLT 429*  --  382  --   HEPARINUNFRC  --   --  0.13* 0.13*  CREATININE 1.31*  --   --   --   TROPONINIHS 525* 520*  --   --     Estimated Creatinine Clearance: 41.3 mL/min (A) (by C-G formula based on SCr of 1.31 mg/dL (H)).   Medical History: Past Medical History:  Diagnosis Date   Age-related macular degeneration, dry, both eyes    Anxiety    Aortic stenosis    Arthritis    probably all over (06/08/2016)   Cardiomyopathy (HCC)    a. EF 40-45% in 2018, normalized in 03/2020.   Chronic back pain    started in my lower back; going up my back in the last couple months (06/08/2016)   Chronic sinusitis    Coronary artery disease    a. s/p CABGx 2V (2014 in [place])  b. PCI (2015 in [place]). c. PCI 2018 ([facility]). d. PCI 03/2020 (complex - [facility]).   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 06/09/2016   NSTEMI (non-ST  elevated myocardial infarction) (HCC) 06/08/2016   Pneumonia    3-4 times maybe (06/08/2016)   PVD (peripheral vascular disease) (HCC)    Recovering alcoholic (HCC)    picked up my 30 year chip the other day (06/08/2016)   S/P angioplasty with stent, 06/08/16 DES, for in-stent restenosis in RCA. 06/09/2016   Sleep apnea    got a mask after OR; don't use it (06/08/2016)   Type II diabetes mellitus (HCC)    Assessment: Luke Harvey is a 78 y.o. year old male admitted on 10/23/2023 with concern for ACS. No anticoagulation prior to admission per dispense history. Pharmacy consulted to dose heparin .  Heparin  level subtherapeutic s/p rate increase to 850 units/hr  Goal of Therapy:  Heparin  level 0.3-0.7 units/ml Monitor platelets by anticoagulation protocol: Yes   Plan:  Heparin  2000 units IV x 1, and gtt rate increase to 1000 units/hr F/u 8 hour heparin  level  Dorn Poot, PharmD, [facility] Clinical Pharmacist ED Pharmacist Phone # [phone] 10/24/2023 1:03 PM

## 2023-10-24 NOTE — Plan of Care (Signed)
  Problem: Education: Goal: Knowledge of General Education information will improve Description: Including pain rating scale, medication(s)/side effects and non-pharmacologic comfort measures Outcome: Progressing   Problem: Health Behavior/Discharge Planning: Goal: Ability to manage health-related needs will improve Outcome: Progressing   Problem: Activity: Goal: Risk for activity intolerance will decrease Outcome: Progressing   Problem: Activity: Goal: Risk for activity intolerance will decrease Outcome: Progressing   Problem: Coping: Goal: Level of anxiety will decrease Outcome: Progressing

## 2023-10-24 NOTE — H&P (View-Only) (Signed)
 Rounding Note   Patient Name: Luke Harvey Date of Encounter: 10/24/2023  [facility] Cardiologist: Candyce Reek, MD   Subjective Denies any chest pain currently but reports chest pain/dyspnea with minimal exertion  Scheduled Meds:  aspirin  EC  81 mg Oral Daily   metoprolol  tartrate  25 mg Oral BID   rosuvastatin   40 mg Oral Daily   ticagrelor   90 mg Oral BID   Continuous Infusions:  heparin  850 Units/hr (10/24/23 0356)   PRN Meds: acetaminophen , ondansetron  (ZOFRAN ) IV   Vital Signs  Vitals:   10/24/23 0400 10/24/23 0530 10/24/23 0545 10/24/23 0700  BP: 129/87 (!) 140/83  129/83  Pulse: 100 (!) 102 (!) 101 99  Resp: (!) 32 (!) 27 (!) 25 16  Temp: 98 F (36.7 C)     TempSrc: Oral     SpO2: 97% 96% 99% 100%  Weight:      Height:       No intake or output data in the 24 hours ending 10/24/23 0758    10/23/2023    3:59 PM 03/29/2022    7:56 PM 02/09/2022   11:35 AM  Last 3 Weights  Weight (lbs) 143 lb 12.8 oz 168 lb 170 lb  Weight (kg) 65.227 kg 76.204 kg 77.111 kg      Telemetry Normal sinus rhythm- Personally Reviewed  ECG  Normal sinus rhythm, aVR elevation with diffuse ST depression- Personally Reviewed  Physical Exam  GEN: No acute distress.   Neck: No JVD Cardiac: RRR, 3/6 systolic murmur Respiratory: Clear to auscultation bilaterally. GI: Soft, nontender, non-distended  MS: No edema; No deformity. Neuro:  Nonfocal  Psych: Normal affect   Labs High Sensitivity Troponin:   Recent Labs  Lab 10/23/23 1620 10/23/23 1753  TROPONINIHS 525* 520*     Chemistry Recent Labs  Lab 10/23/23 1620 10/23/23 1731 10/23/23 1753  NA 136  --   --   K 4.1  --   --   CL 103  --   --   CO2 21*  --   --   GLUCOSE 177*  --   --   BUN 18  --   --   CREATININE 1.31*  --   --   CALCIUM  9.0  --   --   MG  --  2.2  --   PROT  --   --  6.6  ALBUMIN  --   --  2.8*  AST  --   --  28  ALT  --   --  18  ALKPHOS  --   --  71  BILITOT  --    --  0.5  GFRNONAA 55*  --   --   ANIONGAP 12  --   --     Lipids No results for input(s): CHOL, TRIG, HDL, LABVLDL, LDLCALC, CHOLHDL in the last 168 hours.  Hematology Recent Labs  Lab 10/23/23 1620 10/24/23 0257  WBC 8.1 8.3  RBC 4.10* 4.30  HGB 11.5* 12.2*  HCT 35.6* 37.3*  MCV 86.8 86.7  MCH 28.0 28.4  MCHC 32.3 32.7  RDW 12.2 12.4  PLT 429* 382   Thyroid  Recent Labs  Lab 10/23/23 1800  TSH 1.848    BNP Recent Labs  Lab 10/23/23 1730  BNP 873.7*    DDimer No results for input(s): DDIMER in the last 168 hours.   Radiology  DG Chest Port 1 View Result Date: 10/23/2023 CLINICAL DATA:  Worsening angina with sciatica and  shortness of breath. EXAM: PORTABLE CHEST 1 VIEW COMPARISON:  March 29, 2022 FINDINGS: Multiple sternal wires and vascular clips are present. The heart size and mediastinal contours are within normal limits. A coronary artery stent is in place. Mild to moderate severity areas of scarring, atelectasis and/or infiltrate are seen within the bilateral lung bases. There is a small right pleural effusion. No pneumothorax is identified. Postoperative changes are seen involving the cervical spine. No acute osseous abnormalities are present. IMPRESSION: 1. Evidence of prior median sternotomy/CABG. 2. Mild to moderate severity bibasilar scarring, atelectasis and/or infiltrate. 3. Small right pleural effusion. Electronically Signed   By: Suzen Dials M.D.   On: 10/23/2023 19:46   CT Angio Chest PE W and/or Wo Contrast Result Date: 10/23/2023 CLINICAL DATA:  PE suspected. History of blood clots. Exertional shortness of breath and flushing. Possible superior vena cava syndrome. Two EXAM: CT ANGIOGRAPHY CHEST WITH CONTRAST TECHNIQUE: Multidetector CT imaging of the chest was performed using the standard protocol during bolus administration of intravenous contrast. Multiplanar CT image reconstructions and MIPs were obtained to evaluate the vascular  anatomy. RADIATION DOSE REDUCTION: This exam was performed according to the departmental dose-optimization program which includes automated exposure control, adjustment of the mA and/or kV according to patient size and/or use of iterative reconstruction technique. CONTRAST:  75mL OMNIPAQUE  IOHEXOL  350 MG/ML SOLN COMPARISON:  Chest radiograph 10/23/2023 and CT chest 03/30/2022 FINDINGS: Cardiovascular: Cardiomegaly. No pericardial effusion. Negative for pulmonary embolism. Coronary artery and aortic atherosclerotic calcification. Sternotomy and CABG. Right arm venous contrast injection flows through the SVC. No evidence of clot within the SVC noting mixing artifact and streak artifact from dense contrast. Mediastinum/Nodes: Trachea and esophagus are unremarkable. No thoracic adenopathy. Lungs/Pleura: Diffuse interlobular septal thickening and bronchial wall thickening greatest in the lower lungs. Scattered confluent subpleural opacities in the lower lungs. Small right and trace left pleural effusions. No pneumothorax. Upper Abdomen: No acute abnormality. Musculoskeletal: No acute fracture. Review of the MIP images confirms the above findings. IMPRESSION: 1. Negative for pulmonary embolism or SVC syndrome. 2. Cardiomegaly with pulmonary edema. Small right and trace left pleural effusions. 3. Aortic Atherosclerosis (ICD10-I70.0). Electronically Signed   By: Norman Gatlin M.D.   On: 10/23/2023 19:25    Cardiac Studies   Patient Profile   78 y.o. male with a hx of CAD status post CABG x 2 in 2014 with LIMA to LAD, SVG to circumflex/ OM status post stents to RCA, status post stent to SVG to OM in 2023, severe native vessel disease, ischemic cardiomyopathy, hypertension, hyperlipidemia, former smoker, history of DVT who is being seen 10/23/2023 for the evaluation of chest pain   Assessment & Plan  NSTEMI: History of CAD status post CABG x 2 in 2014 with LIMA to LAD, SVG to circumflex/ OM status post stents to  RCA, status post stent to SVG to OM in 2023.  Presented with chest pain and shortness of breath; reports no symptoms at rest but is symptomatic with minimal exertion. EKG with aVR elevation and diffuse ST depressions.  Troponin 525. - Continue aspirin , ticagrelor , heparin  gtt - Continue rosuvastatin  -Continue metoprolol  25 mg twice daily - Echocardiogram - Plan LHC/RHC today.  Risks and benefits of cardiac catheterization have been discussed with the patient.  These include bleeding, infection, kidney damage, stroke, heart attack, death.  The patient understands these risks and is willing to proceed.  Heart failure with recovered EF: EF 40 to 45% in 2018, normalized on echocardiogram 03/2020.  Appears euvolemic  on exam - Continue metoprolol  - Update echo  Aortic stenosis: Moderate AS by echo in 2021, will update echocardiogram  For questions or updates, please contact [facility] Please consult [email or web address] for contact info under     Signed, Lonni LITTIE Nanas, MD  10/24/2023, 7:58 AM

## 2023-10-25 ENCOUNTER — Inpatient Hospital Stay (HOSPITAL_COMMUNITY)

## 2023-10-25 DIAGNOSIS — I2489 Other forms of acute ischemic heart disease: Secondary | ICD-10-CM

## 2023-10-25 DIAGNOSIS — I5021 Acute systolic (congestive) heart failure: Secondary | ICD-10-CM

## 2023-10-25 DIAGNOSIS — I25110 Atherosclerotic heart disease of native coronary artery with unstable angina pectoris: Secondary | ICD-10-CM | POA: Diagnosis not present

## 2023-10-25 DIAGNOSIS — E1159 Type 2 diabetes mellitus with other circulatory complications: Secondary | ICD-10-CM | POA: Diagnosis not present

## 2023-10-25 DIAGNOSIS — I214 Non-ST elevation (NSTEMI) myocardial infarction: Secondary | ICD-10-CM | POA: Diagnosis not present

## 2023-10-25 DIAGNOSIS — I350 Nonrheumatic aortic (valve) stenosis: Secondary | ICD-10-CM

## 2023-10-25 DIAGNOSIS — Z95820 Peripheral vascular angioplasty status with implants and grafts: Secondary | ICD-10-CM

## 2023-10-25 LAB — CBC
HCT: 41.5 % (ref 39.0–52.0)
Hemoglobin: 13.5 g/dL (ref 13.0–17.0)
MCH: 28.1 pg (ref 26.0–34.0)
MCHC: 32.5 g/dL (ref 30.0–36.0)
MCV: 86.3 fL (ref 80.0–100.0)
Platelets: 453 10*3/uL — ABNORMAL HIGH (ref 150–400)
RBC: 4.81 MIL/uL (ref 4.22–5.81)
RDW: 12.4 % (ref 11.5–15.5)
WBC: 10.3 10*3/uL (ref 4.0–10.5)
nRBC: 0 % (ref 0.0–0.2)

## 2023-10-25 LAB — BASIC METABOLIC PANEL WITH GFR
Anion gap: 14 (ref 5–15)
BUN: 18 mg/dL (ref 8–23)
CO2: 19 mmol/L — ABNORMAL LOW (ref 22–32)
Calcium: 9.4 mg/dL (ref 8.9–10.3)
Chloride: 104 mmol/L (ref 98–111)
Creatinine, Ser: 1.28 mg/dL — ABNORMAL HIGH (ref 0.61–1.24)
GFR, Estimated: 57 mL/min — ABNORMAL LOW (ref >60)
Glucose, Bld: 132 mg/dL — ABNORMAL HIGH (ref 70–99)
Potassium: 5 mmol/L (ref 3.5–5.1)
Sodium: 137 mmol/L (ref 135–145)

## 2023-10-25 LAB — ECHOCARDIOGRAM COMPLETE
AR max vel: 0.93 cm2
AV Area VTI: 0.91 cm2
AV Area mean vel: 0.98 cm2
AV Mean grad: 17.7 mmHg
AV Peak grad: 31.7 mmHg
Ao pk vel: 2.81 m/s
Area-P 1/2: 5.42 cm2
Height: 66 in
S' Lateral: 3.6 cm
Weight: 2233.6 [oz_av]

## 2023-10-25 LAB — MAGNESIUM: Magnesium: 2.2 mg/dL (ref 1.7–2.4)

## 2023-10-25 MED ORDER — PERFLUTREN LIPID MICROSPHERE
1.0000 mL | INTRAVENOUS | Status: AC | PRN
  Administered 2023-10-25: 4 mL via INTRAVENOUS

## 2023-10-25 MED ORDER — EMPAGLIFLOZIN 10 MG PO TABS
10.0000 mg | ORAL_TABLET | Freq: Every day | ORAL | Status: DC
  Administered 2023-10-25 – 2023-10-26 (×2): 10 mg via ORAL
  Filled 2023-10-25 (×2): qty 1

## 2023-10-25 NOTE — TOC Initial Note (Addendum)
 Transition of Care [facility]) - Initial/Assessment Note    Patient Details  Name: Luke Harvey MRN: [id number] Date of Birth: 1946/01/17  Transition of Care [facility]) CM/SW Contact:    Sudie Erminio Deems, RN Phone Number: 10/25/2023, 11:44 AM  Clinical Narrative: Patient presented for chest pain- Nstemi post [facility] PTA patient reports that he is from home alone and has support of his daughter occasionally. Patient states he has DME cane and roller walker- he reports that he has been having difficulty walking due to sciatic nerve pain. Case Manager asked MD for PT/OT consult for recommendations. Case Manager called the [facility] fees Coordinator to make them aware of admission. Patient is a member of the [place] [facility]- PCP is Odessa Blanch NP and CSW is Deloras Solian @ [phone] ext [phone]. Case Manager will continue to follow for additional transition of care needs as the patient progresses.        Expected Discharge Plan: Home w Home Health Services Barriers to Discharge: Continued Medical Work up   Expected Discharge Plan and Services In-house Referral: NA Discharge Planning Services: CM Consult Post Acute Care Choice: Home Health Living arrangements for the past 2 months: Single Family Home                   DME Agency: NA   Prior Living Arrangements/Services Living arrangements for the past 2 months: Single Family Home Lives with:: Self Patient language and need for interpreter reviewed:: Yes Do you feel safe going back to the place where you live?: Yes      Need for Family Participation in Patient Care: Yes (Comment) Care giver support system in place?: No (comment) Current home services: DME (cane and roller walker) Criminal Activity/Legal Involvement Pertinent to Current Situation/Hospitalization: No - Comment as needed  Activities of Daily Living   ADL Screening (condition at time of admission) Independently performs ADLs?: Yes (appropriate for  developmental age) Is the patient deaf or have difficulty hearing?: No Does the patient have difficulty seeing, even when wearing glasses/contacts?: No Does the patient have difficulty concentrating, remembering, or making decisions?: No  Permission Sought/Granted Permission sought to share information with : Family Supports, Case Manager   Emotional Assessment Appearance:: Appears stated age Attitude/Demeanor/Rapport: Engaged Affect (typically observed): Appropriate Orientation: : Oriented to Self, Oriented to Place, Oriented to  Time Alcohol / Substance Use: Not Applicable Psych Involvement: No (comment)  Admission diagnosis:  NSTEMI (non-ST elevated myocardial infarction) [facility]) [I21.4] Patient Active Problem List   Diagnosis Date Noted   Acute systolic heart failure (HCC) 10/25/2023   Heart failure with recovered ejection fraction (HFrecEF) (HCC) 10/24/2023   Aortic stenosis 04/05/2020   Hyponatremia 04/05/2020   Status post coronary artery stent placement    Unstable angina (HCC) 04/01/2020   S/P angioplasty with stent, 06/08/16 DES, for in-stent restenosis in RCA. 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 06/09/2016   NSTEMI (non-ST elevated myocardial infarction) (HCC) 06/08/2016   PVD (peripheral vascular disease) (HCC)    Diabetes mellitus (HCC)    Coronary artery disease with hx CABG    HLD (hyperlipidemia)    PCP:  Clinic, Bonni Lien Pharmacy:   [facility] - [place], [place] - [address] [place] [address] Phone: [phone] Fax: [phone]  [id number] [place], [place] - [address] AT [facility] [address] [place] [address] Phone: [phone]  Fax: [phone]  [facility] [address] [place] [address] Phone: [phone] Fax: [phone]  Social Drivers of  Health (SDOH) Social History: SDOH Screenings   Food Insecurity: No Food Insecurity (10/24/2023)  Housing: Unknown (10/24/2023)  Transportation Needs: No Transportation Needs (10/24/2023)  Utilities: Not At Risk (10/24/2023)  Depression (PHQ2-9): Low Risk  (07/16/2020)  Social Connections: Unknown (10/24/2023)  Tobacco Use: Medium Risk (10/23/2023)   Readmission Risk Interventions     No data to display

## 2023-10-25 NOTE — Progress Notes (Signed)
   Alerted by RN that patient was agitated and complaining of being short of breath. Went to assess the patient and he was frustrated saying that no one was listening to him in regards to his symptoms. He tells me that he will randomly not be able to breathe.  He tells me that typically when he had these episodes at home he would take sublingual nitroglycerin .  He told me at 1 point he was taking his Imdur as well as sublingual nitroglycerin  multiple times a day for the symptoms.  He told me that he was explicitly instructed to do this by his prior pulmonologist as they told him it would open up his vessels to help him breathe.  I discussed the importance of being cautious with nitrates now that he is symptomatic with moderate to severe aortic stenosis.  As it seems that his shortness of breath typically subsides shortly after onset, will continue to avoid nitrates at this time.    An updated EKG was obtained and reviewed.  When compared to the prior EKGs there are no acute ischemic changes.  When I examined the patient in the room he stated he was feeling fine, because he was not having one of his episodes at this time.  RN told me that during the entire episode his SpO2 never dropped below 100%.  He is currently placed on oxygen for comfort.  He was also upset about the addition of Jardiance today.  I discussed the importance of Jardiance for his newly reduced EF of 25 to 30%.  He told me that he knows that Bernadine is just for diabetes and so he does not understand why he is taking it.  I discussed how it is not only used for diabetes, although the patient does have diabetes, and can be used in heart failure as well.  The RN had told me that the patient was also upset due to the timing of his outpatient follow-up appointment.  When I asked the patient about this, he stated that he did not know what I was talking about.  I asked him if he had any issues with his timing being too far out close or too soon  after discharge, he stated neither of these were an issue.  Luke DELENA Donath, PA-C 10/25/2023 1:28 PM

## 2023-10-25 NOTE — Progress Notes (Signed)
  Echocardiogram 2D Echocardiogram has been performed.  Luke Harvey 10/25/2023, 9:11 AM

## 2023-10-25 NOTE — Progress Notes (Signed)
  Progress Note  Patient Name: Luke Harvey Date of Encounter: 10/25/2023 [facility] Cardiologist: Candyce Reek, MD   Interval Summary   Patient undergoing echocardiogram when I was in the room  Denies any chest pain Reports some shortness of breath at night Says he was placed on oxygen but his SpO2 looked fine so they said he didn't need it Does not wear oxygen or CPAP at baseline  Has not exerted himself since his procedure therefore does not know if he has exertional symptoms   Vital Signs Vitals:   10/24/23 1857 10/24/23 1909 10/24/23 2323 10/25/23 0352  BP: 113/69 119/66 120/78 (!) 125/90  Pulse: (!) 0 (!) 108 (!) 111 (!) 101  Resp:  16 18 18   Temp:  98.2 F (36.8 C) 97.8 F (36.6 C) 98.4 F (36.9 C)  TempSrc:  Oral Oral Oral  SpO2:  94% 100% 100%  Weight:      Height:        Intake/Output Summary (Last 24 hours) at 10/25/2023 0732 Last data filed at 10/24/2023 1928 Gross per 24 hour  Intake 467.53 ml  Output 0 ml  Net 467.53 ml      10/24/2023    4:04 PM 10/23/2023    3:59 PM 03/29/2022    7:56 PM  Last 3 Weights  Weight (lbs) 139 lb 9.6 oz 143 lb 12.8 oz 168 lb  Weight (kg) 63.322 kg 65.227 kg 76.204 kg     Telemetry/ECG  Sinus rhythm, HR 100s - Personally Reviewed  Physical Exam  GEN: No acute distress.   Neck: No JVD Cardiac: mildly tachycardic, + murmurs, rubs, or gallops.  Respiratory: Clear to auscultation bilaterally. GI: Soft, nontender, non-distended  MS: No edema  Assessment & Plan  78 y.o. male with a hx of CAD s/p CABG x 2 in 2014 with LIMA to LAD, SVG to circumflex/ OM status post stents to RCA, s/p stent to SVG to OM in 2023, severe native vessel disease, ischemic cardiomyopathy, hypertension, hyperlipidemia, former smoker, history of DVT who came in with chest pain   NSTEMI CAD s/p CABG x 2 in 2014, DES to RCA + SVG to OM in 2023  LIMA to LAD, SVG to circumflex/OM in 2014  Presented with chest pain Troponin level 525 >  520  Underwent LHC/RHC yesterday that showed: severe native LCA disease with occcluded LCX and LAD, patent RCA stents, patent LIMA to LAD, SVG-OM had 70% stenosis, successful PCI of vein graft with 2 overlapping stents, mild pulmonary HTN, moderate AS Pending updated echocardiogram DAPT with ASA 81 mg + Brillinta 90 mg BID x at least 12 months  Continue Crestor  40 mg daily  Continue Lopressor  25 mg BID   HFrecEF EF 40-45% in 2018, 56% in 2023  Appears euvolemic on exam Pending updated echocardiogram Continue Lopressor  25 mg BID   Aortic stenosis  Noted to be moderate on echo, cath Pending updated echocardiogram    For questions or updates, please contact [facility] Please consult [email or web address] for contact info under       Signed, Waddell DELENA Donath, PA-C

## 2023-10-25 NOTE — Progress Notes (Signed)
 CARDIAC REHAB PHASE I   PRE:  Rate/Rhythm: 110 ST  BP:  Sitting: 137/76       SpO2: 97 RA  MODE:  Ambulation: 150 ft    POST:  Rate/Rhythm: 125 ST  BP:  Sitting: 113/71      SpO2: Unable to get clear pleth wave  Pt ambulated with CGA using gait belt and rollator in the hallway. Pt needed multiple rest breaks d/t fatigue. I was unable to get his SpO2 d/t poor signal. Pt was retunred to room and educated.  Pt was educated on stent card, stent location, Antiplatelet and ASA use, wt restrictions, no baths/daily wash-ups, s/s of infection, ex guidelines, s/s to stop exercising, NTG use and calling 911, heart healthy diet, risk factors and CRPII. Pt MI book and received materials on exercise, diet, and CRPII. Will refer to [facility].   Emphasized the importance of CR, pt unsure if he will attend.   Luke FORBES Candy  MS, ACSM-CEP 10:44 AM 10/25/2023    Service time is from 1007 to 1044.

## 2023-10-25 NOTE — Progress Notes (Signed)
 Progress Note   Patient: Luke Harvey FMW:[id number] DOB: [birth year] DOA: 10/23/2023  DOS: the patient was seen and examined on 10/25/2023   Brief hospital course:  78 y.o. male with medical history significant of coronary artery disease status post CABG, multiple PCI, ischemic cardiomyopathy with EF of 40 to 45%, essential hypertension, hyperlipidemia, type 2 diabetes, aortic stenosis, DVT, osteoarthritis, who presented with chest pain on and off over the last 6 months.    Assessment and Plan:   Atypical chest pain with NSTEMI - Concerning for ACS given patient's noted cardiac history and worsening chest pain improved with nitro. Cardiology consulted and following closely.  Cardiac catheterization 6/30 noting severe disease, s/p PCI of vein graft.  ECHO pending to evaluate valve function as well.  Continues on DAPT, statin, BB.   Ischemic cardiomyopathy/HFrEF - Does not appear to be in acute exacerbation.  Previous EF 40-45%.  Repeat echo pending.   CAD - S/p CABG and PCI, ischemic cardiomyopathy.  Plan as above.   Diabetes mellitus - Insulin  sliding scale on board.   Peripheral vascular disease - DAPT/heparin  on board for above.  Monitor closely.  Physical debilitation muscle weakness - Noted weakness and dyspnea on exertion.  Likely evaluated by underlying heart disease.  Will order PT/OT.  Evaluate whether patient would benefit from home health versus STR.   Subjective: Patient resting comfortably this morning.  States had just completed his echo this morning, results still pending.  States his symptoms feel improved but still somewhat weak.  Denies any worsening shortness of breath, chest pain, nausea, vomiting, abdominal pain.  Physical Exam:  Vitals:   10/24/23 2323 10/25/23 0352 10/25/23 0901 10/25/23 1151  BP: 120/78 (!) 125/90 137/76 117/74  Pulse: (!) 111 (!) 101 (!) 110 (!) 112  Resp: 18 18 20 20   Temp: 97.8 F (36.6 C) 98.4 F (36.9 C) 98 F (36.7 C)    TempSrc: Oral Oral Oral   SpO2: 100% 100% 98% 96%  Weight:      Height:        GENERAL:  Alert, pleasant, no acute distress  HEENT:  EOMI CARDIOVASCULAR:  RRR, no murmurs appreciated RESPIRATORY:  Clear to auscultation, no wheezing, rales, or rhonchi GASTROINTESTINAL:  Soft, nontender, nondistended EXTREMITIES:  No LE edema bilaterally NEURO:  No new focal deficits appreciated SKIN:  No rashes noted PSYCH:  Appropriate mood and affect    Data Reviewed:  No new imaging to review  Previous records (including but not limited to H&P, progress notes, nursing notes, TOC management) were reviewed in assessment of this patient.  Labs: CBC: Recent Labs  Lab 10/23/23 1620 10/24/23 0257 10/24/23 1712 10/24/23 1721 10/25/23 0444  WBC 8.1 8.3  --   --  10.3  HGB 11.5* 12.2* 11.6* 11.2*  11.6* 13.5  HCT 35.6* 37.3* 34.0* 33.0*  34.0* 41.5  MCV 86.8 86.7  --   --  86.3  PLT 429* 382  --   --  453*   Basic Metabolic Panel: Recent Labs  Lab 10/23/23 1620 10/23/23 1731 10/24/23 1712 10/24/23 1721 10/25/23 0444  NA 136  --  137 131*  135 137  K 4.1  --  3.9 3.7  3.9 5.0  CL 103  --   --   --  104  CO2 21*  --   --   --  19*  GLUCOSE 177*  --   --   --  132*  BUN 18  --   --   --  18  CREATININE 1.31*  --   --   --  1.28*  CALCIUM  9.0  --   --   --  9.4  MG  --  2.2  --   --  2.2   Liver Function Tests: Recent Labs  Lab 10/23/23 1753  AST 28  ALT 18  ALKPHOS 71  BILITOT 0.5  PROT 6.6  ALBUMIN 2.8*   CBG: No results for input(s): GLUCAP in the last 168 hours.  Scheduled Meds:  aspirin  EC  81 mg Oral Daily   empagliflozin  10 mg Oral Daily   metoprolol  tartrate  25 mg Oral BID   rosuvastatin   40 mg Oral Daily   sodium chloride  flush  3 mL Intravenous Q12H   ticagrelor   90 mg Oral BID   Continuous Infusions:  sodium chloride      PRN Meds:.sodium chloride , acetaminophen , ondansetron  (ZOFRAN ) IV, sodium chloride  flush  Family Communication:  40  Disposition: Status is: Inpatient Remains inpatient appropriate because: ACS, atypical chest pain, NSTEMI     Time spent: 40 minutes  Length of inpatient stay: 2 days  Author: Carliss LELON Canales, DO 10/25/2023 11:56 AM  For on call review [email or web address].

## 2023-10-25 NOTE — Progress Notes (Signed)
 Progress Note  Patient Name: Luke Harvey Date of Encounter: 10/25/2023 [facility] Cardiologist: Candyce Reek, MD   Interval Summary   Patient undergoing echocardiogram when I was in the room  Denies any chest pain Reports some shortness of breath at night Says he was placed on oxygen but his SpO2 looked fine so they said he didn't need it Does not wear oxygen or CPAP at baseline  Has not exerted himself since his procedure therefore does not know if he has exertional symptoms   Vital Signs Vitals:   10/24/23 1909 10/24/23 2323 10/25/23 0352 10/25/23 0901  BP: 119/66 120/78 (!) 125/90 137/76  Pulse: (!) 108 (!) 111 (!) 101 (!) 110  Resp: 16 18 18 20   Temp: 98.2 F (36.8 C) 97.8 F (36.6 C) 98.4 F (36.9 C) 98 F (36.7 C)  TempSrc: Oral Oral Oral Oral  SpO2: 94% 100% 100% 98%  Weight:      Height:        Intake/Output Summary (Last 24 hours) at 10/25/2023 1112 Last data filed at 10/25/2023 0900 Gross per 24 hour  Intake 587.53 ml  Output 0 ml  Net 587.53 ml      10/24/2023    4:04 PM 10/23/2023    3:59 PM 03/29/2022    7:56 PM  Last 3 Weights  Weight (lbs) 139 lb 9.6 oz 143 lb 12.8 oz 168 lb  Weight (kg) 63.322 kg 65.227 kg 76.204 kg     Telemetry/ECG  Sinus rhythm, HR 100s - Personally Reviewed  Physical Exam  GEN: No acute distress.   Neck: No JVD Cardiac: mildly tachycardic, + murmurs, rubs, or gallops.  Respiratory: Clear to auscultation bilaterally. GI: Soft, nontender, non-distended  MS: No edema  Assessment & Plan  78 y.o. male with a hx of CAD s/p CABG x 2 in 2014 with LIMA to LAD, SVG to circumflex/ OM status post stents to RCA, s/p stent to SVG to OM in 2023, severe native vessel disease, ischemic cardiomyopathy, hypertension, hyperlipidemia, former smoker, history of DVT who came in with chest pain   NSTEMI CAD s/p CABG x 2 in 2014, DES to RCA + SVG to OM in 2023  LIMA to LAD, SVG to circumflex/OM in 2014  Presented with chest  pain Troponin level 525 > 520  Underwent LHC/RHC yesterday that showed: severe native LCA disease with occcluded LCX and LAD, patent RCA stents, patent LIMA to LAD, SVG-OM had 70% stenosis, successful PCI of vein graft with 2 overlapping stents, mild pulmonary HTN, moderate AS Pending updated echocardiogram DAPT with ASA 81 mg + Brillinta 90 mg BID x at least 12 months  Continue Crestor  40 mg daily  Continue Lopressor  25 mg BID   HFrecEF EF 40-45% in 2018, 56% in 2023  Appears euvolemic on exam Pending updated echocardiogram Continue Lopressor  25 mg BID   Aortic stenosis  Noted to be moderate on echo, cath Pending updated echocardiogram    For questions or updates, please contact [facility] Please consult [email or web address] for contact info under       Signed, Lonni LITTIE Nanas, MD   Patient seen and examined.  Agree with above documentation.  On exam, patient is alert and oriented, regular rate and rhythm, no murmurs, lungs CTAB, no LE edema or JVD.  Underwent PCI to SVG-OM yesterday.  Echo shows EF 25-30%, moderate-severe AS.  BP 137/76.  Cr 1.28, K 5.0.  Will add jardiance 10 mg daily.  Will need outpatient f/u  in valve clinic.   Lonni LITTIE Nanas, MD

## 2023-10-26 ENCOUNTER — Other Ambulatory Visit (HOSPITAL_COMMUNITY): Payer: Self-pay

## 2023-10-26 DIAGNOSIS — I5021 Acute systolic (congestive) heart failure: Secondary | ICD-10-CM | POA: Diagnosis not present

## 2023-10-26 DIAGNOSIS — I214 Non-ST elevation (NSTEMI) myocardial infarction: Secondary | ICD-10-CM | POA: Diagnosis not present

## 2023-10-26 DIAGNOSIS — I350 Nonrheumatic aortic (valve) stenosis: Secondary | ICD-10-CM | POA: Diagnosis not present

## 2023-10-26 LAB — CBC
HCT: 40 % (ref 39.0–52.0)
HCT: 38.4 % — ABNORMAL LOW (ref 39.0–52.0)
Hemoglobin: 13.1 g/dL (ref 13.0–17.0)
Hemoglobin: 12.7 g/dL — ABNORMAL LOW (ref 13.0–17.0)
MCH: 28 pg (ref 26.0–34.0)
MCH: 27.9 pg (ref 26.0–34.0)
MCHC: 32.8 g/dL (ref 30.0–36.0)
MCHC: 33.1 g/dL (ref 30.0–36.0)
MCV: 85.5 fL (ref 80.0–100.0)
MCV: 84.4 fL (ref 80.0–100.0)
Platelets: 443 10*3/uL — ABNORMAL HIGH (ref 150–400)
Platelets: 415 10*3/uL — ABNORMAL HIGH (ref 150–400)
RBC: 4.68 MIL/uL (ref 4.22–5.81)
RBC: 4.55 MIL/uL (ref 4.22–5.81)
RDW: 12.5 % (ref 11.5–15.5)
RDW: 12.5 % (ref 11.5–15.5)
WBC: 11 10*3/uL — ABNORMAL HIGH (ref 4.0–10.5)
WBC: 9.4 10*3/uL (ref 4.0–10.5)
nRBC: 0 % (ref 0.0–0.2)
nRBC: 0 % (ref 0.0–0.2)

## 2023-10-26 LAB — GLUCOSE, CAPILLARY: Glucose-Capillary: 212 mg/dL — ABNORMAL HIGH (ref 70–99)

## 2023-10-26 LAB — BASIC METABOLIC PANEL WITH GFR
Anion gap: 15 (ref 5–15)
BUN: 22 mg/dL (ref 8–23)
CO2: 19 mmol/L — ABNORMAL LOW (ref 22–32)
Calcium: 9.2 mg/dL (ref 8.9–10.3)
Chloride: 101 mmol/L (ref 98–111)
Creatinine, Ser: 1.22 mg/dL (ref 0.61–1.24)
GFR, Estimated: >60 mL/min (ref >60)
Glucose, Bld: 170 mg/dL — ABNORMAL HIGH (ref 70–99)
Potassium: 4.9 mmol/L (ref 3.5–5.1)
Sodium: 135 mmol/L (ref 135–145)

## 2023-10-26 LAB — HEMOGLOBIN A1C
Hgb A1c MFr Bld: 7.4 % — ABNORMAL HIGH (ref 4.8–5.6)
Mean Plasma Glucose: 165.68 mg/dL

## 2023-10-26 LAB — MAGNESIUM: Magnesium: 2.2 mg/dL (ref 1.7–2.4)

## 2023-10-26 LAB — LIPOPROTEIN A (LPA): Lipoprotein (a): 230.2 nmol/L — ABNORMAL HIGH (ref <75.0)

## 2023-10-26 LAB — CK: Total CK: 144 U/L (ref 49–397)

## 2023-10-26 MED ORDER — METOPROLOL SUCCINATE ER 50 MG PO TB24
50.0000 mg | ORAL_TABLET | Freq: Every day | ORAL | 0 refills | Status: DC
  Filled 2023-10-26: qty 30, 30d supply, fill #0

## 2023-10-26 MED ORDER — METOPROLOL SUCCINATE ER 50 MG PO TB24: 50.0000 mg | ORAL_TABLET | Freq: Every day | ORAL | Status: DC

## 2023-10-26 MED ORDER — HYDRALAZINE HCL 20 MG/ML IJ SOLN: 10.0000 mg | INTRAMUSCULAR | Status: DC | PRN

## 2023-10-26 MED ORDER — NITROGLYCERIN 0.4 MG SL SUBL: 0.4000 mg | SUBLINGUAL_TABLET | SUBLINGUAL | Status: AC

## 2023-10-26 MED ORDER — SPIRONOLACTONE 25 MG PO TABS: 12.5000 mg | ORAL_TABLET | Freq: Every day | ORAL | Status: DC

## 2023-10-26 MED ORDER — IPRATROPIUM-ALBUTEROL 0.5-2.5 (3) MG/3ML IN SOLN: 3.0000 mL | RESPIRATORY_TRACT | Status: DC | PRN

## 2023-10-26 MED ORDER — ISOSORBIDE MONONITRATE ER 30 MG PO TB24
30.0000 mg | ORAL_TABLET | Freq: Every day | ORAL | Status: DC
  Administered 2023-10-26: 30 mg via ORAL
  Filled 2023-10-26: qty 1

## 2023-10-26 MED ORDER — SPIRONOLACTONE 12.5 MG HALF TABLET
12.5000 mg | ORAL_TABLET | Freq: Every day | ORAL | Status: DC
  Administered 2023-10-26: 12.5 mg via ORAL
  Filled 2023-10-26: qty 1

## 2023-10-26 MED ORDER — SPIRONOLACTONE 25 MG PO TABS
12.5000 mg | ORAL_TABLET | Freq: Every day | ORAL | 0 refills | Status: DC
  Filled 2023-10-26: qty 30, 60d supply, fill #0

## 2023-10-26 MED ORDER — EMPAGLIFLOZIN 10 MG PO TABS: 10.0000 mg | ORAL_TABLET | Freq: Every day | ORAL | Status: AC

## 2023-10-26 MED ORDER — INSULIN ASPART 100 UNIT/ML IJ SOLN: 0.0000 [IU] | Freq: Three times a day (TID) | INTRAMUSCULAR | Status: DC

## 2023-10-26 MED ORDER — METOPROLOL TARTRATE 5 MG/5ML IV SOLN: 5.0000 mg | INTRAVENOUS | Status: DC | PRN

## 2023-10-26 NOTE — Discharge Summary (Signed)
 Physician Discharge Summary  Luke Harvey FMW:[id number] DOB: Jun 26, 1945 DOA: 10/23/2023  PCP: [facility]  Admit date: 10/23/2023 Discharge date: 10/26/2023  Admitted From: Home Disposition: Home  Recommendations for Outpatient Follow-up:  Follow up with PCP in 1-2 weeks Please obtain BMP/CBC in one week your next doctors visit.  Toprol  XL 50 mg daily, Aldactone 25 mg daily, isosorbide mononitrate 30 mg daily Aspirin  and Brilinta  for at least 12 months.  Continue statin Jardiance 10 mg daily   Discharge Condition: Stable CODE STATUS: Full code Diet recommendation: Heart healthy  Brief/Interim Summary: Brief Narrative:   78 y.o. male with medical history significant of coronary artery disease status post CABG, multiple PCI, ischemic cardiomyopathy with EF of 40 to 45%, essential hypertension, hyperlipidemia, type 2 diabetes, aortic stenosis, DVT, osteoarthritis, who presented with chest pain on and off over the last 6 months.  Patient underwent LHC/RHC showing severe CAD with successful PCI per cardiology.  Currently recommending DAPT with aspirin  and Brilinta  for at least 12 months along with statin and beta-blocker.  During hospitalization patient was found to have severe aortic stenosis.  Cardiology team recommending outpatient follow-up with structural/valve team outpatient.  Medications have been adjusted per their service.  Stable for discharge with outpatient follow-up  Assessment & Plan:  Principal Problem:   NSTEMI (non-ST elevated myocardial infarction) (HCC) Active Problems:   PVD (peripheral vascular disease) (HCC)   Diabetes mellitus (HCC)   Coronary artery disease with hx CABG   HLD (hyperlipidemia)   S/P angioplasty with stent, 06/08/16 DES, for in-stent restenosis in RCA.   Heart failure with recovered ejection fraction (HFrecEF) (HCC)   Acute systolic heart failure (HCC)    Atypical chest pain with NSTEMI CAD status post PCI - Concerning for  ACS given patient's noted cardiac history and worsening chest pain improved with nitro. Cardiology consulted and following closely.  Cardiac catheterization 6/30 noting severe disease, s/p PCI of vein graft.  Needs aspirin  and Brilinta  for 12 months along with statin and beta-blocker - Echocardiogram reduced EF 30% and severe AAS   Ischemic cardiomyopathy/HFrEF Severe aortic stenosis - Echocardiogram shows worsening EF of 30%.  Also shows hypokinesis of the wall-severe aortic stenosis.  Management per cardiology team -GDMT per cardiology   Diabetes mellitus type II - No orders for sliding scale and Accu-Chek, will [place] order   Peripheral vascular disease On DAPT   Physical debilitation muscle weakness PT/OT  DVT prophylaxis: On DAPT    Code Status: Full Code Family Communication:   Status is: Inpatient Charge home, cleared by cardiology   Subjective:  Seen and examined at bedside.  At first she told me he has slight shortness of breath but then he goes on telling me that he rather go home and follow-up outpatient as we do not plan on performing any cardiac intervention during this hospitalization.  Examination:  General exam: Appears calm and comfortable  Respiratory system: Clear to auscultation. Respiratory effort normal. Cardiovascular system: S1 & S2 heard, RRR.  Systolic murmur Gastrointestinal system: Abdomen is nondistended, soft and nontender. No organomegaly or masses felt. Normal bowel sounds heard. Central nervous system: Alert and oriented. No focal neurological deficits. Extremities: Symmetric 5 x 5 power. Skin: No rashes, lesions or ulcers Psychiatry: Judgement and insight appear normal. Mood & affect appropriate.    Discharge Diagnoses:  Principal Problem:   NSTEMI (non-ST elevated myocardial infarction) (HCC) Active Problems:   PVD (peripheral vascular disease) (HCC)   Diabetes mellitus (HCC)   Coronary artery  disease with hx CABG   HLD  (hyperlipidemia)   S/P angioplasty with stent, 06/08/16 DES, for in-stent restenosis in RCA.   Heart failure with recovered ejection fraction (HFrecEF) (HCC)   Acute systolic heart failure (HCC)      Discharge Exam: Vitals:   10/26/23 0758 10/26/23 1300  BP: (!) 123/57 128/83  Pulse: (!) 101 93  Resp: 18 16  Temp: 97.6 F (36.4 C) (!) 97.2 F (36.2 C)  SpO2: 100% 99%   Vitals:   10/25/23 2357 10/26/23 0400 10/26/23 0758 10/26/23 1300  BP: 128/74 130/79 (!) 123/57 128/83  Pulse: (!) 102 (!) 101 (!) 101 93  Resp: 18 18 18 16   Temp: 98.5 F (36.9 C) 98.4 F (36.9 C) 97.6 F (36.4 C) (!) 97.2 F (36.2 C)  TempSrc: Oral Oral Oral Oral  SpO2: 99% 100% 100% 99%  Weight:      Height:          Discharge Instructions  Discharge Instructions     Amb Referral to Cardiac Rehabilitation   Complete by: As directed    Diagnosis:  Coronary Stents NSTEMI     After initial evaluation and assessments completed: Virtual Based Care may be provided alone or in conjunction with Phase 2 Cardiac Rehab based on patient barriers.: Yes   Intensive Cardiac Rehabilitation (ICR) [facility] location only OR Traditional Cardiac Rehabilitation (TCR) *If criteria for ICR are not met will enroll in TCR ([facility] only): Yes      Allergies as of 10/26/2023       Reactions   Tetracyclines & Related Hives, Itching   Cozaar [losartan] Cough   Zestril  [lisinopril ] Cough   Lipitor [atorvastatin] Other (See Comments)   Myalgias         Medication List     STOP taking these medications    metoprolol  tartrate 50 MG tablet Commonly known as: LOPRESSOR        TAKE these medications    acetaminophen  500 MG tablet Commonly known as: TYLENOL  Take 500 mg by mouth 2 (two) times daily.   albuterol 108 (90 Base) MCG/ACT inhaler Commonly known as: VENTOLIN HFA Inhale 2 puffs into the lungs every 6 (six) hours as needed for wheezing or shortness of breath.   APPLE CIDER VINEGAR PO Take 1 capsule by mouth  daily at 12 noon.   aspirin  EC 81 MG tablet Take 81 mg by mouth daily at 12 noon.   BIOTIN PO Take 1 tablet by mouth daily at 12 noon.   carboxymethylcellulose 0.5 % Soln Commonly known as: REFRESH PLUS Place 1 drop into both eyes See admin instructions. Administer 1 drop into each eye every daily. May use every 12 hours if needed for dry eyes.   COQ10 PO Take 1 capsule by mouth daily at 12 noon.   diclofenac Sodium 1 % Gel Commonly known as: VOLTAREN 1 Application every 6 (six) hours as needed for pain.   DULoxetine 30 MG capsule Commonly known as: CYMBALTA Take 30 mg by mouth daily at 12 noon.   DULoxetine 20 MG capsule Commonly known as: CYMBALTA Take 20 mg by mouth daily at 12 noon.   empagliflozin 10 MG Tabs tablet Commonly known as: JARDIANCE Take 1 tablet (10 mg total) by mouth daily. Start taking on: October 27, 2023 What changed:  medication strength how much to take   fluticasone 50 MCG/ACT nasal spray Commonly known as: FLONASE Place 1-2 sprays into both nostrils daily as needed for allergies or rhinitis.  isosorbide mononitrate 30 MG 24 hr tablet Commonly known as: IMDUR Take 60 mg by mouth daily at 12 noon.   ketoconazole 2 % shampoo Commonly known as: NIZORAL Apply 1 Application topically every 3 (three) days.   ketoconazole 2 % cream Commonly known as: NIZORAL Apply 1 Application topically 2 (two) times daily as needed for irritation.   L-CARNITINE PO Take 2 tablets by mouth daily at 12 noon.   lidocaine  5 % Commonly known as: LIDODERM  Place 1 patch onto the skin daily as needed (pain).   loratadine 10 MG tablet Commonly known as: CLARITIN Take 10 mg by mouth daily at 12 noon.   MAGNESIUM PO Take 2 tablets by mouth daily at 12 noon.   metFORMIN  500 MG tablet Commonly known as: GLUCOPHAGE  Take 1,000 mg by mouth 2 (two) times daily as needed (BS > 140).   metoprolol  succinate 50 MG 24 hr tablet Commonly known as: TOPROL -XL Take 1 tablet  (50 mg total) by mouth daily. Take with or immediately following a meal. Start taking on: October 27, 2023   MILK THISTLE PO Take 1 tablet by mouth daily at 12 noon.   nitroGLYCERIN  0.4 MG SL tablet Commonly known as: NITROSTAT  Place 1 tablet (0.4 mg total) under the tongue See admin instructions. Take 1 tablet (0.4) mg by mouth every day. May take an additional 1 tablet later in the day if  chest pain. What changed: additional instructions   OVER THE COUNTER MEDICATION Take 1 capsule by mouth daily at 12 noon. VisiUltra   OVER THE COUNTER MEDICATION Take 1 capsule by mouth daily at 12 noon. blood boost supplement for glucose control   PreserVision AREDS 2 Caps Take 1 capsule by mouth 2 (two) times daily.   PROBIOTIC PO Take 2 capsules by mouth daily at 12 noon.   QUERCETIN PO Take 1 tablet by mouth daily at 12 noon.   rosuvastatin  40 MG tablet Commonly known as: CRESTOR  Take 1 tablet (40 mg total) by mouth daily. What changed: when to take this   SAW PALMETTO PO Take 1 tablet by mouth daily at 12 noon.   SELENIUM PO Take 1 tablet by mouth daily at 12 noon.   spironolactone 25 MG tablet Commonly known as: ALDACTONE Take 0.5 tablets (12.5 mg total) by mouth daily.   Synthroid  75 MCG tablet Generic drug: levothyroxine  Take 75 mcg by mouth daily before breakfast.   TART CHERRY PO Take 1 tablet by mouth daily at 12 noon.   ticagrelor  90 MG Tabs tablet Commonly known as: Brilinta  Take 1 tablet (90 mg total) by mouth 2 (two) times daily.   TURMERIC (CURCUMIN) PO Take 1 tablet by mouth daily at 12 noon.   VITAMIN B12 PO Take 1 tablet by mouth daily at 12 noon.   VITAMIN D-3 PO Take 1 tablet by mouth daily at 12 noon.        Follow-up Information     [facility] Follow up.   Why: Registered Nurse, Physical and Occupational Therapy-office to call with visit times. Please call [place] if you have any additional needs @ [phone] Contact  information: [address] [place] [address] [phone]                Allergies  Allergen Reactions   Tetracyclines & Related Hives and Itching   Cozaar [Losartan] Cough   Zestril  [Lisinopril ] Cough   Lipitor [Atorvastatin] Other (See Comments)    Myalgias  You were cared for by a hospitalist during your hospital stay. If you have any questions about your discharge medications or the care you received while you were in the hospital after you are discharged, you can call the unit and asked to speak with the hospitalist on call if the hospitalist that took care of you is not available. Once you are discharged, your primary care physician will handle any further medical issues. Please note that no refills for any discharge medications will be authorized once you are discharged, as it is imperative that you return to your primary care physician (or establish a relationship with a primary care physician if you do not have one) for your aftercare needs so that they can reassess your need for medications and monitor your lab values.  You were cared for by a hospitalist during your hospital stay. If you have any questions about your discharge medications or the care you received while you were in the hospital after you are discharged, you can call the unit and asked to speak with the hospitalist on call if the hospitalist that took care of you is not available. Once you are discharged, your primary care physician will handle any further medical issues. Please note that NO REFILLS for any discharge medications will be authorized once you are discharged, as it is imperative that you return to your primary care physician (or establish a relationship with a primary care physician if you do not have one) for your aftercare needs so that they can reassess your need for medications and monitor your lab values.  Please request your Prim.MD to go over all Hospital Tests and  Procedure/Radiological results at the follow up, please get all Hospital records sent to your Prim MD by signing hospital release before you go home.  Get CBC, CMP, 2 view Chest X ray checked  by Primary MD during your next visit or SNF MD in 5-7 days ( we routinely change or add medications that can affect your baseline labs and fluid status, therefore we recommend that you get the mentioned basic workup next visit with your PCP, your PCP may decide not to get them or add new tests based on their clinical decision)  On your next visit with your primary care physician please Get Medicines reviewed and adjusted.  If you experience worsening of your admission symptoms, develop shortness of breath, life threatening emergency, suicidal or homicidal thoughts you must seek medical attention immediately by calling 911 or calling your MD immediately  if symptoms less severe.  You Must read complete instructions/literature along with all the possible adverse reactions/side effects for all the Medicines you take and that have been prescribed to you. Take any new Medicines after you have completely understood and accpet all the possible adverse reactions/side effects.   Do not drive, operate heavy machinery, perform activities at heights, swimming or participation in water activities or provide baby sitting services if your were admitted for syncope or siezures until you have seen by Primary MD or a Neurologist and advised to do so again.  Do not drive when taking Pain medications.   Procedures/Studies: ECHOCARDIOGRAM COMPLETE Result Date: 10/25/2023    ECHOCARDIOGRAM REPORT   Patient Name:   Luke [facility] Date of Exam: 10/25/2023 Medical Rec #:  [id number]        Height:       66.0 in Accession #:    [id number]       Weight:  139.6 lb Date of Birth:  11/16/1945        BSA:          1.716 m Patient Age:    78 years         BP:           125/90 mmHg Patient Gender: M                HR:           108 bpm.  Exam Location:  Inpatient Procedure: 2D Echo, Cardiac Doppler, Color Doppler and Intracardiac            Opacification Agent (Both Spectral and Color Flow Doppler were            utilized during procedure). Indications:    Acute ischemic heart disease, unspecified I24.9                 Aortic stenosis I35.0  History:        Patient has prior history of Echocardiogram examinations, most                 recent 02/16/2022. Previous Myocardial Infarction and Angina,                 Prior CABG, Aortic Valve Disease; Risk Factors:Dyslipidemia.  Sonographer:    Thea Norlander RCS Sonographer#2:  Juliene Rucks Referring Phys: [id number] DAVID W HARDING IMPRESSIONS  1. There is no left ventricular thrombus (Definity contrast used). Findings suggest infarction/scar versus severe resting ischemia in the mid-distal LAD artery distribution and ischemia/partial scar in the proximal LAD and in the entire right coronary artery distribution. Left ventricular ejection fraction, by estimation, is 25 to 30%. The left ventricle has moderate to severely decreased function. The left ventricle demonstrates regional wall motion abnormalities (see scoring diagram/findings for description). Left ventricular diastolic parameters are consistent with Grade II diastolic dysfunction (pseudonormalization). Elevated left atrial pressure. There is akinesis of the left ventricular, mid-apical anterolateral wall, anterior wall and anteroseptal wall. There is severe hypokinesis of the left ventricular, basal anteroseptal wall and anterior wall. There is moderate hypokinesis of the left ventricular, basal-mid inferior wall and inferoseptal wall.  2. Right ventricular systolic function is normal. The right ventricular size is normal. There is normal pulmonary artery systolic pressure. The estimated right ventricular systolic pressure is 32.2 mmHg.  3. Left atrial size was moderately dilated.  4. The mitral valve is degenerative. Mild to moderate mitral valve  regurgitation. No evidence of mitral stenosis. Moderate to severe mitral annular calcification.  5. Tricuspid valve regurgitation is mild to moderate.  6. The right and left aortic cusps are virtually immobile (fused?), while the posterior noncoronary cusp has almost normal mobility. Hemodynamics are consistet with moderate to severe low flow-low gradient aortic stenosis. The aortic valve has an indeterminant number of cusps. There is moderate calcification of the aortic valve. There is moderate thickening of the aortic valve. Aortic valve regurgitation is moderate. Moderate to severe aortic valve stenosis. Aortic valve mean gradient measures 17.7 mmHg. Aortic valve Vmax measures 2.81 m/s. Aortic valve acceleration time measures 95 msec.  7. The inferior vena cava is normal in size with greater than 50% respiratory variability, suggesting right atrial pressure of 3 mmHg. Comparison(s): A prior study was performed on 2023 [facility]. Prior images unable to be directly viewed, comparison made by report only. The left ventricular function is significantly worse. The left ventricular wall motion abnormalities are significantly worse. Aortic stenosis is worse and  was underestimated on previous studies due to low stroke volume index. 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 FINDINGS  Left Ventricle: There is no left ventricular thrombus (Definity contrast used). Findings suggest infarction/scar versus severe resting ischemia in the mid-distal LAD artery distribution and ischemia/partial scar in the proximal LAD and in the entire right coronary artery distribution. Left ventricular ejection fraction, by estimation, is 25 to 30%. The left ventricle has moderate to severely decreased function. The left ventricle demonstrates  regional wall motion abnormalities. Severe hypokinesis of  the left ventricular, basal anteroseptal wall and anterior wall. Moderate hypokinesis of the left ventricular, basal-mid inferior wall and inferoseptal wall. Definity contrast agent was given IV to delineate the left ventricular endocardial borders. The  left ventricular internal cavity size was normal in size. There is no left ventricular hypertrophy. Left ventricular diastolic parameters are consistent with Grade II diastolic dysfunction (pseudonormalization). Elevated left atrial pressure.  LV Wall Scoring: The mid anteroseptal segment, apical lateral segment, mid anterolateral segment, apical anterior segment, apical inferior segment, and apex are akinetic. The anterior wall, inferior septum, inferior wall, and basal anteroseptal segment are hypokinetic. The posterior wall and basal anterolateral segment are normal. Right Ventricle: The right ventricular size is normal. No increase in right ventricular wall thickness. Right ventricular systolic function is normal. There is normal pulmonary artery systolic pressure. The tricuspid regurgitant velocity is 2.70 m/s, and  with an assumed right atrial pressure of 3 mmHg, the estimated right ventricular systolic pressure is 32.2 mmHg. Left Atrium: Left atrial size was moderately dilated. Right Atrium: Right atrial size was normal in size. Pericardium: There is no evidence of pericardial effusion. Mitral Valve: The mitral valve is degenerative in appearance. Moderate to severe mitral annular calcification. Mild to moderate mitral valve regurgitation, with centrally-directed jet. No evidence of mitral valve stenosis. Tricuspid Valve: The tricuspid valve is normal in structure. Tricuspid valve regurgitation is mild to moderate. Aortic Valve: The right and left aortic cusps are virtually immobile (fused?), while the posterior noncoronary cusp has almost normal mobility. Hemodynamics are consistet with moderate to  severe low flow-low gradient aortic stenosis. The aortic valve has  an indeterminant number of cusps. There is moderate calcification of the aortic valve. There is moderate thickening of the aortic valve. Aortic valve regurgitation is moderate. Moderate to severe aortic stenosis is present. Aortic valve mean gradient measures 17.7 mmHg. Aortic valve peak gradient measures 31.7 mmHg. Aortic valve area, by VTI measures 0.91 cm. Pulmonic Valve: The pulmonic valve was grossly normal. Pulmonic valve regurgitation is trivial. No evidence of pulmonic stenosis. Aorta: The aortic root and ascending aorta are structurally normal, with no evidence of dilitation. Venous: The inferior vena cava is normal in size with greater than 50% respiratory variability, suggesting right atrial pressure of 3 mmHg. IAS/Shunts: No atrial level shunt detected by color flow Doppler.  LEFT VENTRICLE PLAX 2D LVIDd:         4.20 cm   Diastology LVIDs:         3.60 cm   LV e' medial:    5.44 cm/s LV PW:         1.10 cm   LV E/e' medial:  23.7 LV IVS:        1.00 cm   LV e' lateral:   7.94 cm/s LVOT diam:     2.00 cm  LV E/e' lateral: 16.2 LV SV:         44 LV SV Index:   26 LVOT Area:     3.14 cm  RIGHT VENTRICLE             IVC RV S prime:     12.90 cm/s  IVC diam: 1.60 cm TAPSE (M-mode): 1.8 cm LEFT ATRIUM             Index        RIGHT ATRIUM           Index LA diam:        3.60 cm 2.10 cm/m   RA Area:     13.20 cm LA Vol (A2C):   63.5 ml 37.00 ml/m  RA Volume:   34.40 ml  20.04 ml/m LA Vol (A4C):   57.1 ml 33.27 ml/m LA Biplane Vol: 63.1 ml 36.76 ml/m  AORTIC VALVE AV Area (Vmax):    0.93 cm AV Area (Vmean):   0.98 cm AV Area (VTI):     0.91 cm AV Vmax:           281.33 cm/s AV Vmean:          194.000 cm/s AV VTI:            0.486 m AV Peak Grad:      31.7 mmHg AV Mean Grad:      17.7 mmHg LVOT Vmax:         83.60 cm/s LVOT Vmean:        60.300 cm/s LVOT VTI:          0.141 m LVOT/AV VTI ratio: 0.29  AORTA Ao Root diam: 3.00 cm Ao  Asc diam:  3.40 cm MITRAL VALVE                TRICUSPID VALVE MV Area (PHT): 5.42 cm     TR Peak grad:   29.2 mmHg MV Decel Time: 140 msec     TR Vmax:        270.00 cm/s MV E velocity: 129.00 cm/s MV A velocity: 91.40 cm/s   SHUNTS MV E/A ratio:  1.41         Systemic VTI:  0.14 m                             Systemic Diam: 2.00 cm Jerel Croitoru MD Electronically signed by Jerel Balding MD Signature Date/Time: 10/25/2023/10:03:35 AM    Final    CARDIAC CATHETERIZATION Addendum Date: 10/24/2023   Mid LM to Prox LAD lesion is 95% stenosed with 90% stenosed side branch in Ost Cx to Prox Cx.   Prox LAD to Mid LAD lesion is 100% stenosed with 99% stenosed side branch in 2nd Diag.   Previously placed mid RCA to Dist RCA DES stent is 30% stenosed.  Previously placed Prox RCA DES stent of is widely patent.   LIMA-LAD and is very large, and existing no disease.  Retrograde flow to the LAD fills a diagonal branch and antegrade flow reaches the apex.  No competitive flow.   2nd Mrg lesion is 100% stenosed.   Prox Graft to Mid Graft lesion is 90% stenosed.  (Lesion #3)   A drug-eluting stent was successfully placed, overlapping with previous distal stent and covering the lesion, using a STENT SYNERGY XD 3.0X32.  Deployed to 3.3 mm with stent balloon.   Post intervention, there is a 0%  residual stenosis.   Mid Graft lesion is 80% stenosed (lesion #4.)-Focal in-stent restenosis but otherwise the stent is widely patent.  Scoring balloon angioplasty was performed using a BALLOON SCOREFLEX 3.0X10.  Post intervention, there is a 10% residual stenosis.   Dist Graft lesion is 45% stenosed.   Prox Graft-2 lesion is 60% stenosed.  Prox Graft-1 lesion is 25% stenosed.   A drug-eluting stent was successfully placed overlapping the previous ostial and proximal stent and the newly placed stent, using a STENT SYNERGY XD 3.0X24.  Deployed to 3.3 mm. Post intervention, there is a 0% residual stenosis.   Postintervention, there is TIMI-3  flow maintained throughout the SVG-OM 2 graft   ---------------------------------------   Hemodynamic findings consistent with mild pulmonary hypertension.   There is moderate aortic valve stenosis.  Mean gradient estimated 24.3 mmHg.  Trivial from estimated based on right heart cath numbers Diagnostic  Dominance: Right      Intervention    POST-CATH DIAGNOSES Severe Native Left Coronary Artery Disease with essentially occluded LCx and LAD, very small caliber ramus and OM1 remain within 90% distal LM stenosis. Patent RCA stents with mild stenosis between the stents but otherwise stable flow. Patent LIMA to LAD SVG-OM has ostial 70% stenosis, mid graft there is tandem 60 and 90% stenosis as well as 80% ISR in the proximal edge of the more distal stent. - Successful score flex angioplasty of the ISR and PTCA-DES PCI of the vein graft connecting the 2 previous stent using 2 overlapping Synergy Stents (3.0 mm x 32 Owen 3.0 mm x 24 mm with all stents deployed to roughly 3.3 mm using stent balloon for overlap post dilation all the way to the ostium. Right Heart Cath: RAP mean 12 mmHg, RVEDP-EDP 58/10-15 mmHg; PAP-mean 43/19-29 mmHg, PCWP 17 mmHg; LV P-EDP 162/1-23 mmHg, AO P-MAP 126/50-81 mmHg. AoV: PSV gradient 38 mm 3 with a mean of 24 mmHg. (264 ms)-moderate stenosis. Ao sat 98%, PA sat 61%. Fick Cardiac Output-Index 3.76-2.17-moderately reduced. RECOMMENDATIONS   Anticipated discharge date to be determined.   Patient will need to have echocardiogram completed and reviewed to reassess aortic valve. Titrate GDMT based on echo results and CAD.   Recommend uninterrupted dual antiplatelet therapy with Aspirin  81mg  daily and Ticagrelor  90mg  twice daily for a minimum of 12 months (ACS-Class I recommendation). Alm MICAEL Clay, MD, MS Alm Clay, M.D., M.S. Interventional Cardiologist [facility] Pager # [phone]   Result Date: 10/24/2023 Images from the original result were not included.   Mid LM to Prox  LAD lesion is 95% stenosed with 90% stenosed side branch in Ost Cx to Prox Cx.   Prox LAD to Mid LAD lesion is 100% stenosed with 99% stenosed side branch in 2nd Diag.   Previously placed mid RCA to Dist RCA DES stent is 30% stenosed.  Previously placed Prox RCA DES stent of is widely patent.   LIMA-LAD and is very large, and existing no disease.  Retrograde flow to the LAD fills a diagonal branch and antegrade flow reaches the apex.  No competitive flow.   2nd Mrg lesion is 100% stenosed.   ---------------------------------   SVG-OM2 graft was visualized by angiography and is large.  Multiple segment lesions noted including ISR of the ostial and mid vessel stents as well as Denovo stenosis in the intervening segment.   Origin to Prox Graft stent is 70% stenosed (Lesion #1).  Scoring balloon angioplasty was performed using a BALLOON SCOREFLEX 3.0X10.  Reduced to 10%  residual stenosis.   Prox Graft-2 lesion is 60% stenosed.  (Lesion #2)   A drug-eluting stent was successfully placed using a STENT SYNERGY XD 3.0X24.   Prox Graft to Mid Graft lesion is 90% stenosed.  (Lesion #3)   A drug-eluting stent was successfully placed, overlapping with previous distal stent and covering the lesion, using a STENT SYNERGY XD 3.0X32.  Deployed to 3.3 mm with stent balloon.   Post intervention, there is a 0% residual stenosis.   Mid Graft lesion is 80% stenosed (lesion #4.)-Focal in-stent restenosis but otherwise the stent is widely patent.  Scoring balloon angioplasty was performed using a BALLOON SCOREFLEX 3.0X10.  Post intervention, there is a 0% residual stenosis.   Dist Graft lesion is 45% stenosed.   Prox Graft-1 lesion is 25% stenosed.   ---------------------------------------   Hemodynamic findings consistent with mild pulmonary hypertension.   There is moderate aortic valve stenosis.  Mean gradient estimated 24.3 mmHg.  Trivial from estimated based on right heart cath numbers   Post intervention, there is a 0% residual  stenosis. Diagnostic  Dominance: Right      Intervention    POST-CATH DIAGNOSES Severe Native Left Coronary Artery Disease with essentially occluded LCx and LAD, very small caliber ramus and OM1 remain within 90% distal LM stenosis. Patent RCA stents with mild stenosis between the stents but otherwise stable flow. Patent LIMA to LAD SVG-OM has ostial 70% stenosis, mid graft there is tandem 60 and 90% stenosis as well as 80% ISR in the proximal edge of the more distal stent. - Successful score flex angioplasty of the ISR and PTCA-DES PCI of the vein graft connecting the 2 previous stent using 2 overlapping Synergy Stents (3.0 mm x 32 Owen 3.0 mm x 24 mm with all stents deployed to roughly 3.3 mm using stent balloon for overlap post dilation all the way to the ostium. Right Heart Cath: RAP mean 12 mmHg, RVEDP-EDP 58/10-15 mmHg; PAP-mean 43/19-29 mmHg, PCWP 17 mmHg; LV P-EDP 162/1-23 mmHg, AO P-MAP 126/50-81 mmHg. AoV: PSV gradient 38 mm 3 with a mean of 24 mmHg. (264 ms)-moderate stenosis. Ao sat 98%, PA sat 61%. Fick Cardiac Output-Index 3.76-2.17-moderately reduced. RECOMMENDATIONS   Anticipated discharge date to be determined.   Patient will need to have echocardiogram completed and reviewed to reassess aortic valve. Titrate GDMT based on echo results and CAD.   Recommend uninterrupted dual antiplatelet therapy with Aspirin  81mg  daily and Ticagrelor  90mg  twice daily for a minimum of 12 months (ACS-Class I recommendation). Alm MICAEL Clay, MD, MS Alm Clay, M.D., M.S. Interventional Cardiologist [facility] Pager # [phone]  DG Chest Port 1 View Result Date: 10/23/2023 CLINICAL DATA:  Worsening angina with sciatica and shortness of breath. EXAM: PORTABLE CHEST 1 VIEW COMPARISON:  March 29, 2022 FINDINGS: Multiple sternal wires and vascular clips are present. The heart size and mediastinal contours are within normal limits. A coronary artery stent is in place. Mild to moderate severity areas  of scarring, atelectasis and/or infiltrate are seen within the bilateral lung bases. There is a small right pleural effusion. No pneumothorax is identified. Postoperative changes are seen involving the cervical spine. No acute osseous abnormalities are present. IMPRESSION: 1. Evidence of prior median sternotomy/CABG. 2. Mild to moderate severity bibasilar scarring, atelectasis and/or infiltrate. 3. Small right pleural effusion. Electronically Signed   By: Suzen Dials M.D.   On: 10/23/2023 19:46   CT Angio Chest PE W and/or Wo Contrast Result Date: 10/23/2023 CLINICAL DATA:  PE  suspected. History of blood clots. Exertional shortness of breath and flushing. Possible superior vena cava syndrome. Two EXAM: CT ANGIOGRAPHY CHEST WITH CONTRAST TECHNIQUE: Multidetector CT imaging of the chest was performed using the standard protocol during bolus administration of intravenous contrast. Multiplanar CT image reconstructions and MIPs were obtained to evaluate the vascular anatomy. RADIATION DOSE REDUCTION: This exam was performed according to the departmental dose-optimization program which includes automated exposure control, adjustment of the mA and/or kV according to patient size and/or use of iterative reconstruction technique. CONTRAST:  75mL OMNIPAQUE  IOHEXOL  350 MG/ML SOLN COMPARISON:  Chest radiograph 10/23/2023 and CT chest 03/30/2022 FINDINGS: Cardiovascular: Cardiomegaly. No pericardial effusion. Negative for pulmonary embolism. Coronary artery and aortic atherosclerotic calcification. Sternotomy and CABG. Right arm venous contrast injection flows through the SVC. No evidence of clot within the SVC noting mixing artifact and streak artifact from dense contrast. Mediastinum/Nodes: Trachea and esophagus are unremarkable. No thoracic adenopathy. Lungs/Pleura: Diffuse interlobular septal thickening and bronchial wall thickening greatest in the lower lungs. Scattered confluent subpleural opacities in the lower  lungs. Small right and trace left pleural effusions. No pneumothorax. Upper Abdomen: No acute abnormality. Musculoskeletal: No acute fracture. Review of the MIP images confirms the above findings. IMPRESSION: 1. Negative for pulmonary embolism or SVC syndrome. 2. Cardiomegaly with pulmonary edema. Small right and trace left pleural effusions. 3. Aortic Atherosclerosis (ICD10-I70.0). Electronically Signed   By: Norman Gatlin M.D.   On: 10/23/2023 19:25     The results of significant diagnostics from this hospitalization (including imaging, microbiology, ancillary and laboratory) are listed below for reference.     Microbiology: Recent Results (from the past 240 hours)  Resp panel by RT-PCR (RSV, Flu A&B, Covid) Anterior Nasal Swab     Status: None   Collection Time: 10/23/23  5:30 PM   Specimen: Anterior Nasal Swab  Result Value Ref Range Status   SARS Coronavirus 2 by RT PCR NEGATIVE NEGATIVE Final   Influenza A by PCR NEGATIVE NEGATIVE Final   Influenza B by PCR NEGATIVE NEGATIVE Final    Comment: (NOTE) The Xpert Xpress SARS-CoV-2/FLU/RSV plus assay is intended as an aid in the diagnosis of influenza from Nasopharyngeal swab specimens and should not be used as a sole basis for treatment. Nasal washings and aspirates are unacceptable for Xpert Xpress SARS-CoV-2/FLU/RSV testing.  Fact Sheet for Patients: BloggerCourse.com  Fact Sheet for Healthcare Providers: SeriousBroker.it  This test is not yet approved or cleared by the [place]  FDA and has been authorized for detection and/or diagnosis of SARS-CoV-2 by FDA under an Emergency Use Authorization (EUA). This EUA will remain in effect (meaning this test can be used) for the duration of the COVID-19 declaration under Section 564(b)(1) of the Act, 21 U.S.C. section 360bbb-3(b)(1), unless the authorization is terminated or revoked.     Resp Syncytial Virus by PCR NEGATIVE  NEGATIVE Final    Comment: (NOTE) Fact Sheet for Patients: BloggerCourse.com  Fact Sheet for Healthcare Providers: SeriousBroker.it  This test is not yet approved or cleared by the [place]  FDA and has been authorized for detection and/or diagnosis of SARS-CoV-2 by FDA under an Emergency Use Authorization (EUA). This EUA will remain in effect (meaning this test can be used) for the duration of the COVID-19 declaration under Section 564(b)(1) of the Act, 21 U.S.C. section 360bbb-3(b)(1), unless the authorization is terminated or revoked.  Performed at [facility], [address], [place], [place] [address]      Labs: BNP (last 3 results)  Recent Labs    10/23/23 1730  BNP 873.7*   Basic Metabolic Panel: Recent Labs  Lab 10/23/23 1620 10/23/23 1731 10/24/23 1712 10/24/23 1721 10/25/23 0444 10/26/23 0350  NA 136  --  137 131*  135 137 135  K 4.1  --  3.9 3.7  3.9 5.0 4.9  CL 103  --   --   --  104 101  CO2 21*  --   --   --  19* 19*  GLUCOSE 177*  --   --   --  132* 170*  BUN 18  --   --   --  18 22  CREATININE 1.31*  --   --   --  1.28* 1.22  CALCIUM  9.0  --   --   --  9.4 9.2  MG  --  2.2  --   --  2.2 2.2   Liver Function Tests: Recent Labs  Lab 10/23/23 1753  AST 28  ALT 18  ALKPHOS 71  BILITOT 0.5  PROT 6.6  ALBUMIN 2.8*   No results for input(s): LIPASE, AMYLASE in the last 168 hours. No results for input(s): AMMONIA in the last 168 hours. CBC: Recent Labs  Lab 10/23/23 1620 10/24/23 0257 10/24/23 1712 10/24/23 1721 10/25/23 0444 10/26/23 0350 10/26/23 1113  WBC 8.1 8.3  --   --  10.3 11.0* 9.4  HGB 11.5* 12.2* 11.6* 11.2*  11.6* 13.5 13.1 12.7*  HCT 35.6* 37.3* 34.0* 33.0*  34.0* 41.5 40.0 38.4*  MCV 86.8 86.7  --   --  86.3 85.5 84.4  PLT 429* 382  --   --  453* 443* 415*   Cardiac Enzymes: Recent Labs  Lab 10/26/23 0350  CKTOTAL 144   BNP: Invalid input(s):  POCBNP CBG: Recent Labs  Lab 10/26/23 1302  GLUCAP 212*   D-Dimer No results for input(s): DDIMER in the last 72 hours. Hgb A1c Recent Labs    10/26/23 1113  HGBA1C 7.4*   Lipid Profile No results for input(s): CHOL, HDL, LDLCALC, TRIG, CHOLHDL, LDLDIRECT in the last 72 hours. Thyroid function studies Recent Labs    10/23/23 1800  TSH 1.848   Anemia work up No results for input(s): VITAMINB12, FOLATE, FERRITIN, TIBC, IRON, RETICCTPCT in the last 72 hours. Urinalysis    Component Value Date/Time   COLORURINE YELLOW 10/23/2023 2058   APPEARANCEUR CLEAR 10/23/2023 2058   LABSPEC >1.046 (H) 10/23/2023 2058   PHURINE 5.0 10/23/2023 2058   GLUCOSEU >=500 (A) 10/23/2023 2058   HGBUR NEGATIVE 10/23/2023 2058   BILIRUBINUR NEGATIVE 10/23/2023 2058   KETONESUR 5 (A) 10/23/2023 2058   PROTEINUR NEGATIVE 10/23/2023 2058   NITRITE NEGATIVE 10/23/2023 2058   LEUKOCYTESUR NEGATIVE 10/23/2023 2058   Sepsis Labs Recent Labs  Lab 10/24/23 0257 10/25/23 0444 10/26/23 0350 10/26/23 1113  WBC 8.3 10.3 11.0* 9.4   Microbiology Recent Results (from the past 240 hours)  Resp panel by RT-PCR (RSV, Flu A&B, Covid) Anterior Nasal Swab     Status: None   Collection Time: 10/23/23  5:30 PM   Specimen: Anterior Nasal Swab  Result Value Ref Range Status   SARS Coronavirus 2 by RT PCR NEGATIVE NEGATIVE Final   Influenza A by PCR NEGATIVE NEGATIVE Final   Influenza B by PCR NEGATIVE NEGATIVE Final    Comment: (NOTE) The Xpert Xpress SARS-CoV-2/FLU/RSV plus assay is intended as an aid in the diagnosis of influenza from Nasopharyngeal swab specimens and should not be used as a sole  basis for treatment. Nasal washings and aspirates are unacceptable for Xpert Xpress SARS-CoV-2/FLU/RSV testing.  Fact Sheet for Patients: BloggerCourse.com  Fact Sheet for Healthcare Providers: SeriousBroker.it  This test is  not yet approved or cleared by the [place]  FDA and has been authorized for detection and/or diagnosis of SARS-CoV-2 by FDA under an Emergency Use Authorization (EUA). This EUA will remain in effect (meaning this test can be used) for the duration of the COVID-19 declaration under Section 564(b)(1) of the Act, 21 U.S.C. section 360bbb-3(b)(1), unless the authorization is terminated or revoked.     Resp Syncytial Virus by PCR NEGATIVE NEGATIVE Final    Comment: (NOTE) Fact Sheet for Patients: BloggerCourse.com  Fact Sheet for Healthcare Providers: SeriousBroker.it  This test is not yet approved or cleared by the [place]  FDA and has been authorized for detection and/or diagnosis of SARS-CoV-2 by FDA under an Emergency Use Authorization (EUA). This EUA will remain in effect (meaning this test can be used) for the duration of the COVID-19 declaration under Section 564(b)(1) of the Act, 21 U.S.C. section 360bbb-3(b)(1), unless the authorization is terminated or revoked.  Performed at [facility], [address], [place], [place] [address]      Time coordinating discharge:  I have spent 35 minutes face to face with the patient and on the ward discussing the patients care, assessment, plan and disposition with other care givers. >50% of the time was devoted counseling the patient about the risks and benefits of treatment/Discharge disposition and coordinating care.   SIGNED:   Burgess JAYSON Dare, MD  [facility] 10/26/2023, 1:58 PM   If 7PM-7AM, please contact night-coverage

## 2023-10-26 NOTE — Plan of Care (Signed)

## 2023-10-26 NOTE — Progress Notes (Addendum)
 Progress Note  Patient Name: Luke Harvey Date of Encounter: 10/26/2023 [facility] Cardiologist: Candyce Reek, MD   Interval Summary   Patient reports still having episodes of not being able to breathe Shows me a video on his phone of him hyperventilating  He will be working with physical therapy shortly, will see how he does with that  Vital Signs Vitals:   10/25/23 2020 10/25/23 2357 10/26/23 0400 10/26/23 0758  BP: 113/67 128/74 130/79 (!) 123/57  Pulse: (!) 112 (!) 102 (!) 101 (!) 101  Resp: 18 18 18 18   Temp: 98.3 F (36.8 C) 98.5 F (36.9 C) 98.4 F (36.9 C) 97.6 F (36.4 C)  TempSrc: Oral Oral Oral Oral  SpO2: 100% 99% 100% 100%  Weight:      Height:        Intake/Output Summary (Last 24 hours) at 10/26/2023 0912 Last data filed at 10/25/2023 1746 Gross per 24 hour  Intake 600 ml  Output --  Net 600 ml      10/24/2023    4:04 PM 10/23/2023    3:59 PM 03/29/2022    7:56 PM  Last 3 Weights  Weight (lbs) 139 lb 9.6 oz 143 lb 12.8 oz 168 lb  Weight (kg) 63.322 kg 65.227 kg 76.204 kg     Telemetry/ECG  Sinus rhythm, HR 100s - Personally Reviewed  Physical Exam  GEN: No acute distress.   Neck: No JVD Cardiac: mildly tachycardic.  Respiratory: Clear to auscultation bilaterally. GI: Soft, nontender, non-distended  MS: No edema  Assessment & Plan  78 y.o. male with a hx of CAD s/p CABG x 2 in 2014 with LIMA to LAD, SVG to circumflex/ OM status post stents to RCA, s/p stent to SVG to OM in 2023, severe native vessel disease, ischemic cardiomyopathy, hypertension, hyperlipidemia, former smoker, history of DVT who came in with chest pain    NSTEMI CAD s/p CABG x 2 in 2014, DES to RCA + SVG to OM in 2023  LIMA to LAD, SVG to circumflex/OM in 2014  Presented with chest pain Troponin level 525 > 520  Underwent LHC/RHC yesterday that showed: severe native LCA disease with occcluded LCX and LAD, patent RCA stents, patent LIMA to LAD, SVG-OM had 70%  stenosis, successful PCI of vein graft with 2 overlapping stents, mild pulmonary HTN, moderate AS DAPT with ASA 81 mg + Brillinta 90 mg BID x at least 12 months  Continue Crestor  40 mg daily  Continue Lopressor  25 mg BID    Chronic HFrEF Echo this admission showed: LVEF 25-30%, LV RWMA, G2DD Appears euvolemic on exam Continue metoprolol , will consolidate to toprol  XL prior to discharge Continue jardiance 10 mg daily Prior intolerance to ACE/ARBs Add spironolactone 12.5 mg daily   Moderate to severe aortic stenosis  Echo this admission showed: mild to moderate MR, mild to moderate TR, moderate to severe low flow-low gradient aortic stenosis  Will need to follow up with our valve clinic as outpatient     [facility] will sign off.   Medication Recommendations:  ASA 81 mg, Brilinta  80 mg BID, toprol  XL 50 mg daily, jardiance 10 mg daily, spironolactone 12.5 mg daily. Imdur 30 mg daily Other recommendations (labs, testing, etc):  BMET in 1 week Follow up as an outpatient:  Will schedule with general cardiology and valve clinic   For questions or updates, please contact [facility] Please consult [email or web address] for contact info under  Signed, Waddell DELENA Donath, PA-C   Patient seen and examined.  Agree with above documentation.  On exam, patient is alert, regular rate and rhythm, 3/6 systolic murmur, lungs CTAB, no LE edema or JVD.  Will continue to adjust GDMT.  On metoprolol  and jardiance, will add spironolactone.  Has not tolerated ACE/ARB in past. Will schedule outpatient f/u in valve clinic.  Lonni LITTIE Nanas, MD

## 2023-10-26 NOTE — Hospital Course (Addendum)
 Brief Narrative:   78 y.o. male with medical history significant of coronary artery disease status post CABG, multiple PCI, ischemic cardiomyopathy with EF of 40 to 45%, essential hypertension, hyperlipidemia, type 2 diabetes, aortic stenosis, DVT, osteoarthritis, who presented with chest pain on and off over the last 6 months.  Patient underwent LHC/RHC showing severe CAD with successful PCI per cardiology.  Currently recommending DAPT with aspirin  and Brilinta  for at least 12 months along with statin and beta-blocker.  During hospitalization patient was found to have severe aortic stenosis.  Cardiology team recommending outpatient follow-up with structural/valve team outpatient.  Medications have been adjusted per their service.  Stable for discharge with outpatient follow-up  Assessment & Plan:  Principal Problem:   NSTEMI (non-ST elevated myocardial infarction) (HCC) Active Problems:   PVD (peripheral vascular disease) (HCC)   Diabetes mellitus (HCC)   Coronary artery disease with hx CABG   HLD (hyperlipidemia)   S/P angioplasty with stent, 06/08/16 DES, for in-stent restenosis in RCA.   Heart failure with recovered ejection fraction (HFrecEF) (HCC)   Acute systolic heart failure (HCC)    Atypical chest pain with NSTEMI CAD status post PCI - Concerning for ACS given patient's noted cardiac history and worsening chest pain improved with nitro. Cardiology consulted and following closely.  Cardiac catheterization 6/30 noting severe disease, s/p PCI of vein graft.  Needs aspirin  and Brilinta  for 12 months along with statin and beta-blocker - Echocardiogram reduced EF 30% and severe AAS   Ischemic cardiomyopathy/HFrEF Severe aortic stenosis - Echocardiogram shows worsening EF of 30%.  Also shows hypokinesis of the wall-severe aortic stenosis.  Management per cardiology team -GDMT per cardiology   Diabetes mellitus type II - No orders for sliding scale and Accu-Chek, will [place] order    Peripheral vascular disease On DAPT   Physical debilitation muscle weakness PT/OT  DVT prophylaxis: On DAPT    Code Status: Full Code Family Communication:   Status is: Inpatient Charge home, cleared by cardiology   Subjective:  Seen and examined at bedside.  At first she told me he has slight shortness of breath but then he goes on telling me that he rather go home and follow-up outpatient as we do not plan on performing any cardiac intervention during this hospitalization.  Examination:  General exam: Appears calm and comfortable  Respiratory system: Clear to auscultation. Respiratory effort normal. Cardiovascular system: S1 & S2 heard, RRR.  Systolic murmur Gastrointestinal system: Abdomen is nondistended, soft and nontender. No organomegaly or masses felt. Normal bowel sounds heard. Central nervous system: Alert and oriented. No focal neurological deficits. Extremities: Symmetric 5 x 5 power. Skin: No rashes, lesions or ulcers Psychiatry: Judgement and insight appear normal. Mood & affect appropriate.

## 2023-10-26 NOTE — Progress Notes (Signed)
 Heart Failure Navigator Progress Note  Assessed for Heart & Vascular TOC clinic readiness.  Patient does not meet criteria due to has a scheduled [facility] appointment on 11/10/2023. No HF TOC. .   Navigator will sign off at this time.   Stephane Haddock, BSN, Scientist, clinical (histocompatibility and immunogenetics) Only

## 2023-10-26 NOTE — Evaluation (Signed)
 Occupational Therapy Evaluation Patient Details Name: Luke Harvey MRN: [id number] DOB: Jul 05, 1945 Today's Date: 10/26/2023   History of Present Illness   Pt is a 78 y.o. male admitted 6/29 c/o of SOB & DOE. 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 PTA pt was Mod I with ADLs and IADLs. Today pt is requiring set up  to CGA for ADLs. Functional transfers are  CGA with use of tripod cane. Pt presenting with decreased activity tolerance, requiring seated rest break during in room mobility. Pt with poor STM, and comprehension and insight into cardiopulmonary status. Recommendation of HHOT to build activity tolerance and promote safety within the home. OT will continue to follow acutely to maximize functional independence.        If plan is discharge home, recommend the following:   A little help with walking and/or transfers;A little help with bathing/dressing/bathroom;Assistance with cooking/housework;Direct supervision/assist for medications management     Functional Status Assessment   Patient has had a recent decline in their functional status and demonstrates the ability to make significant improvements in function in a reasonable and predictable amount of time.     Equipment Recommendations   BSC/3in1;Tub/shower seat     Recommendations for Other Services         Precautions/Restrictions   Precautions Precautions: Fall Recall of Precautions/Restrictions: Intact Restrictions Weight Bearing Restrictions Per Provider Order: No     Mobility Bed Mobility   General bed mobility comments: Pt received in recliner    Transfers Overall transfer level: Needs assistance Equipment used:   (Tripod cane) Transfers: Sit to/from Stand, Bed to chair/wheelchair/BSC Sit to Stand: Contact guard assist     Step pivot transfers: Contact guard assist     General transfer comment: CGA using tripod cane, decreased activity tolerance, requiring a seated rest break during in room mobility      Balance Overall balance assessment: Needs assistance Sitting-balance support: Feet supported, No upper extremity supported Sitting balance-Leahy Scale: Fair     Standing balance support: During functional activity, Reliant on assistive device for balance, Single extremity supported Standing balance-Leahy Scale: Poor Standing balance comment: Benefits from UE support     ADL either performed or assessed with clinical judgement   ADL Overall ADL's : Needs assistance/impaired Eating/Feeding: Set up;Sitting   Grooming: Contact guard assist;Standing   Upper Body Bathing: Set up;Sitting   Lower Body Bathing: Contact guard assist;Sit to/from stand   Upper Body Dressing : Set up;Sitting   Lower Body Dressing: Contact guard assist;Sit to/from stand   Toilet Transfer: Contact guard assist;Ambulation (tripod cane) Toilet Transfer Details (indicate cue type and reason): simulated in room Toileting- Clothing Manipulation and Hygiene: Contact guard assist;Sit to/from stand       Functional mobility during ADLs: Contact guard assist;Cane General ADL Comments: decreased activity tolerance and balance     Vision Baseline Vision/History: 1 Wears glasses Patient Visual Report: No change from baseline Vision Assessment?: No apparent visual deficits            Pertinent Vitals/Pain Pain Assessment Pain Assessment: No/denies pain     Extremity/Trunk Assessment Upper Extremity Assessment Upper Extremity Assessment: Overall WFL for tasks assessed (pt with hx of peripheral neuropathy was  completing outpatient hand therapy to address)   Lower Extremity Assessment Lower Extremity  Assessment: Defer to PT evaluation   Cervical / Trunk Assessment Cervical / Trunk Assessment: Normal   Communication Communication Communication: Impaired Factors Affecting Communication: Hearing impaired   Cognition Arousal: Alert Behavior During Therapy: WFL for tasks assessed/performed Cognition: Cognition impaired     Awareness: Intellectual awareness intact, Online awareness impaired Memory impairment (select all impairments): Short-term memory   Executive functioning impairment (select all impairments): Problem solving, Reasoning OT - Cognition Comments: Pt with poor STM, decreased understanding of impairments, poor health literacy     Following commands: Intact       Cueing  General Comments   Cueing Techniques: Verbal cues  VSS on RA, HR increased up to 118 during mobility           Home Living Family/patient expects to be discharged to:: Private residence Living Arrangements: Alone Available Help at Discharge: Family;Available PRN/intermittently Type of Home: House Home Access: Stairs to enter [employer] of Steps: 3   Home Layout: One level     Bathroom Shower/Tub: Producer, television/film/video: Standard Bathroom Accessibility: Yes How Accessible: Accessible via walker Home Equipment: Rolling Walker (2 wheels);Other (comment);Grab bars - tub/shower;Shower seat - built Scientist, clinical (histocompatibility and immunogenetics) (Tripod cane) Adaptive Equipment: Sock aid;Long-handled shoe horn        Prior Functioning/Environment Prior Level of Function : Independent/Modified Independent     Mobility Comments: Pt reports 1 fall in the last 6 months. use of tripod cane ADLs Comments: use of AE for LB dressing, does microwaveable meals    OT Problem List: Decreased strength;Decreased activity tolerance;Impaired balance (sitting and/or standing);Decreased cognition;Decreased safety awareness;Decreased knowledge of use of DME or AE;Cardiopulmonary status limiting  activity   OT Treatment/Interventions: Therapeutic exercise;Self-care/ADL training;Energy conservation;DME and/or AE instruction;Therapeutic activities;Patient/family education;Balance training      OT Goals(Current goals can be found in the care plan section)   Acute Rehab OT Goals Patient Stated Goal: To get a BSC OT Goal Formulation: With patient Time For Goal Achievement: 11/09/23 Potential to Achieve Goals: Good   OT Frequency:  Min 2X/week       AM-PAC OT 6 Clicks Daily Activity     Outcome Measure Help from another person eating meals?: None Help from another person taking care of personal grooming?: A Little Help from another person toileting, which includes using toliet, bedpan, or urinal?: A Little Help from another person bathing (including washing, rinsing, drying)?: A Little Help from another person to put on and taking off regular upper body clothing?: A Little Help from another person to put on and taking off regular lower body clothing?: A Little 6 Click Score: 19   End of Session Equipment Utilized During Treatment: Gait belt;Other (comment) (Tripod cane) Nurse Communication: Mobility status  Activity Tolerance: Patient tolerated treatment well Patient left: in chair;with call bell/phone within reach  OT Visit Diagnosis: Unsteadiness on feet (R26.81);Other abnormalities of gait and mobility (R26.89);Muscle weakness (generalized) (M62.81)                Time: 9251-9177 OT Time Calculation (min): 34 min Charges:  OT General Charges $OT Visit: 1 Visit OT Evaluation $OT Eval Moderate Complexity: 1 Mod OT Treatments $Self Care/Home Management : 8-22 mins  Adrianne BROCKS, OT  Acute Rehabilitation Services Office [phone] Secure chat preferred   Adrianne GORMAN Savers 10/26/2023, 8:54 AM

## 2023-10-26 NOTE — TOC Transition Note (Signed)
 Transition of Care [facility]) - Discharge Note   Patient Details  Name: Luke Harvey MRN: [id number] Date of Birth: 1945/07/24  [facility] of [facility]) CM/SW Contact:  Sudie Erminio Deems, RN Phone Number: 10/26/2023, 1:00 PM   Clinical Narrative:  Patient will return home today and Case Manager discussed home health with the patient. Patient asked Case Manager to call his daughter. Daughter has no agency preference for [employer]. Referral submitted to [facility] for RN/PT/OT- MD to update orders for RN services. Start of care to begin within 24-48 hours post transition home. Daughter to provide transportation home. No further needs identified at this time.   Final next level of care: Home w Home Health Services Barriers to Discharge: No Barriers Identified   Patient Goals and CMS Choice Patient states their goals for this hospitalization and ongoing recovery are:: plan to return home  Discharge Plan and Services Additional resources added to the After Visit Summary for   In-house Referral: NA Discharge Planning Services: CM Consult Post Acute Care Choice: Home Health            DME Agency: NA       HH Arranged: RN, Disease Management, PT, OT HH Agency: [facility] Date [facility] Agency Contacted: 10/26/23 Time HH Agency Contacted: 1300 Representative spoke with at [facility] Agency: [place]  Social Drivers of Health (SDOH) Interventions SDOH Screenings   Food Insecurity: No Food Insecurity (10/24/2023)  Housing: Unknown (10/24/2023)  Transportation Needs: No Transportation Needs (10/24/2023)  Utilities: Not At Risk (10/24/2023)  Depression (PHQ2-9): Low Risk  (07/16/2020)  Social Connections: Unknown (10/24/2023)  Tobacco Use: Medium Risk (10/23/2023)     Readmission Risk Interventions     No data to display

## 2023-10-26 NOTE — Evaluation (Signed)
 Physical Therapy Evaluation Patient Details Name: Luke Harvey MRN: [id number] DOB: 06-04-1945 Today's Date: 10/26/2023  History of Present Illness  Pt is a 78 y.o. male admitted 6/29 with SOB & DOE. Found to have NSTEMI, heart cath showed severe native LCA disease with occcluded LCX and LAD, s/p successful PCI. 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   Clinical Impression  Pt in bed upon arrival of PT, agreeable to evaluation at this time. Prior to admission the pt was ambulating with use of his cane in the home, reports walking short distances in the home (~40 ft) as needed but availability of chairs around home for seated rest as needed. The pt was able to complete ~30 ft ambulation to hallway before onset of what he calls a breathing attack where he has increased RR but no change in SpO2. Pt was provided with chair for seated rest and recovery, then after ~1 min seated rest he was able to ambulate back to room with cane and CGA withotu further respiratory issues. Pt then able to complete 5x sit-stand with use of cane and single UE support, will benefit from continued skilled PT acutely and HHPT after d/c to facilitate improved endurance, LE strength, power, and reduce fall risk in the home.   SpO2 on 4L at rest: 100% (on 4L O2 upon my arrival) SpO2 on RA at rest: 100% SpO2 on RA with ambulation: 98% HR with ambulation: 115bpm  5X Sit-to-Stand: 19.8 sec (> 12.6 sec indicates increased risk of falls for individuals aged 2-79, > 15 sec indicates increased risk of recurrent falls)    If plan is discharge home, recommend the following: A little help with walking and/or transfers;Assistance with cooking/housework;Direct supervision/assist for medications management;Direct supervision/assist for financial management;Assist for transportation;Help with stairs or ramp for entrance   Can travel by private vehicle         Equipment Recommendations BSC/3in1  Recommendations for Other Services       Functional Status Assessment Patient has had a recent decline in their functional status and demonstrates the ability to make significant improvements in function in a reasonable and predictable amount of time.     Precautions / Restrictions Precautions Precautions: Fall Recall of Precautions/Restrictions: Intact Precaution/Restrictions Comments: pt with breathing attacks SpO2 100% Restrictions Weight Bearing Restrictions Per Provider Order: No      Mobility  Bed Mobility Overal bed mobility: Needs Assistance Bed Mobility: Supine to Sit     Supine to sit: HOB elevated, Supervision     General bed mobility comments: supervision with slightly increased time    Transfers Overall transfer level: Needs assistance Equipment used:  (Tripod cane) Transfers: Sit to/from Stand, Bed to chair/wheelchair/BSC Sit to Stand: Contact guard assist, Supervision   Step pivot transfers: Contact guard assist       General transfer comment: CGA initially from EOB, completed 5x sit-stand from chair with supervision. dependent on single UE support.    Ambulation/Gait Ambulation/Gait assistance: Contact guard assist Gait Distance (Feet): 30 Feet (+ 40 ft) Assistive device:  (tripod cane) Gait Pattern/deviations: Step-through pattern, Decreased stride length, Narrow base of support Gait velocity: decreased Gait velocity interpretation: <1.31 ft/sec, indicative of household ambulator   General Gait Details: pt with slowed gait, onset of breathing attack in hallway with pt having increased respiratory rate, poor reading of SpO2 but 100% once reading accurate (on RA). Pt then able to walk back to room without  issue or onset of breathing issues, SpO2 98-100% on RA. HR to 115    Balance Overall balance assessment: Needs assistance Sitting-balance support: Feet supported, No upper extremity supported Sitting  balance-Leahy Scale: Fair     Standing balance support: During functional activity, Reliant on assistive device for balance, Single extremity supported Standing balance-Leahy Scale: Poor Standing balance comment: Benefits from UE support                             Pertinent Vitals/Pain Pain Assessment Pain Assessment: No/denies pain    Home Living Family/patient expects to be discharged to:: Private residence Living Arrangements: Alone Available Help at Discharge: Family;Available PRN/intermittently Type of Home: House Home Access: Stairs to enter   Entrance Stairs-Number of Steps: 3   Home Layout: One level Home Equipment: Agricultural consultant (2 wheels);Other (comment);Grab bars - tub/shower;Shower seat - built Scientist, clinical (histocompatibility and immunogenetics) (Tripod cane)      Prior Function Prior Level of Function : Independent/Modified Independent             Mobility Comments: Pt reports 1 fall in the last 6 months. use of tripod cane, ambulates ~40 ft at a time across house from bed-recliner. has chairs throughout home for resting spots as needed ADLs Comments: use of AE for LB dressing, does microwaveable meals     Extremity/Trunk Assessment   Upper Extremity Assessment Upper Extremity Assessment: Defer to OT evaluation    Lower Extremity Assessment Lower Extremity Assessment: LLE deficits/detail LLE Deficits / Details: grossly 4/5 at ankle, 3/5 at knee and hip due to pt reports of sciatica pain. reports no change in sensation. LLE Sensation: WNL LLE Coordination: WNL    Cervical / Trunk Assessment Cervical / Trunk Assessment: Normal  Communication   Communication Communication: Impaired Factors Affecting Communication: Hearing impaired    Cognition Arousal: Alert Behavior During Therapy: WFL for tasks assessed/performed   PT - Cognitive impairments: No family/caregiver present to determine baseline                       PT - Cognition Comments: pt  following all commands once PT talking slowly for patient and in shorter, simple sentences. Pt with poor insight to breathing vs cardiac issues and medical course. tangential about medical hx at times. reports he does not understand why he would need any additional assist after d/c from hospital Following commands: Intact       Cueing Cueing Techniques: Verbal cues     General Comments General comments (skin integrity, edema, etc.): SpO2 to 98% on RA with gait, pt had onset of breathing attack while walking with increased RR but SpO2 100%. Recovered with seated rest and was able to then return to room wihtout issue    Exercises Other Exercises Other Exercises: 5x sit-stand: 19.8 sec   Assessment/Plan    PT Assessment Patient needs continued PT services  PT Problem List Cardiopulmonary status limiting activity;Decreased activity tolerance;Decreased balance       PT Treatment Interventions DME instruction;Gait training;Stair training;Functional mobility training;Therapeutic activities;Therapeutic exercise;Balance training;Patient/family education    PT Goals (Current goals can be found in the Care Plan section)  Acute Rehab PT Goals Patient Stated Goal: return home, figure out breathing issues PT Goal Formulation: With patient Time For Goal Achievement: 11/09/23 Potential to Achieve Goals: Good    Frequency Min 2X/week        AM-PAC PT 6 Clicks Mobility  Outcome Measure Help  needed turning from your back to your side while in a flat bed without using bedrails?: None Help needed moving from lying on your back to sitting on the side of a flat bed without using bedrails?: None Help needed moving to and from a bed to a chair (including a wheelchair)?: A Little Help needed standing up from a chair using your arms (e.g., wheelchair or bedside chair)?: A Little Help needed to walk in hospital room?: A Little Help needed climbing 3-5 steps with a railing? : A Little 6 Click  Score: 20    End of Session Equipment Utilized During Treatment: Gait belt Activity Tolerance: Patient tolerated treatment well;Patient limited by fatigue Patient left: in chair;with call bell/phone within reach;with chair alarm set Nurse Communication: Mobility status PT Visit Diagnosis: Other abnormalities of gait and mobility (R26.89);Muscle weakness (generalized) (M62.81)    Time: 9049-8977 PT Time Calculation (min) (ACUTE ONLY): 32 min   Charges:   PT Evaluation $PT Eval Moderate Complexity: 1 Mod PT Treatments $Therapeutic Exercise: 8-22 mins PT General Charges $$ ACUTE PT VISIT: 1 Visit         Izetta Call, PT, DPT   Acute Rehabilitation Department Office [phone] Secure Chat Communication Preferred  Izetta JULIANNA Call 10/26/2023, 11:01 AM

## 2023-10-28 ENCOUNTER — Telehealth: Payer: Self-pay | Admitting: Physician Assistant

## 2023-10-28 NOTE — Telephone Encounter (Signed)
 78 y.o. male with hx of CAD, CHF, AS. He was just DC a few days ago after admission with NSTEMI tx with DES to S-OM. 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 He has OP appt 7/23.  Pt daughter called answering service. DPR not on file. I could not reach the patient by phone after multiple attempts. I spoke to the daughter regarding symptoms and history that she provided only.  [name] notes that her father has continued to have severe shortness of breath with minimal exertion. It has not gotten any better since he presented to the hospital 6/29. Some of his symptoms sound worse. He is having orthopnea, paroxysmal nocturnal dyspnea. He cannot sleep well. He has had shortness of breath at rest as well. No chest pain, syncope. She asked if he can take NTG but we discussed the dangers of NTG in patients with severe AS.   PLAN: - I recommended he go back to the ED based upon his current symptoms. - I will ask our structural heart team if they can get him in for a sooner appt this coming week (if he decides to not go to the ED).  Glendia Ferrier, PA-C    10/28/2023 3:09 PM

## 2023-10-29 ENCOUNTER — Encounter (HOSPITAL_COMMUNITY): Payer: Self-pay | Admitting: *Deleted

## 2023-10-29 ENCOUNTER — Other Ambulatory Visit: Payer: Self-pay

## 2023-10-29 ENCOUNTER — Emergency Department (HOSPITAL_COMMUNITY)

## 2023-10-29 ENCOUNTER — Inpatient Hospital Stay (HOSPITAL_COMMUNITY)
Admission: EM | Admit: 2023-10-29 | Discharge: 2023-11-08 | DRG: 266 | Disposition: A | Discharge status: Home Health | Attending: Cardiovascular Disease | Admitting: Cardiovascular Disease

## 2023-10-29 DIAGNOSIS — I214 Non-ST elevation (NSTEMI) myocardial infarction: Secondary | ICD-10-CM | POA: Diagnosis present

## 2023-10-29 DIAGNOSIS — I252 Old myocardial infarction: Secondary | ICD-10-CM

## 2023-10-29 DIAGNOSIS — I5021 Acute systolic (congestive) heart failure: Secondary | ICD-10-CM | POA: Diagnosis not present

## 2023-10-29 DIAGNOSIS — I130 Hypertensive heart and chronic kidney disease with heart failure and stage 1 through stage 4 chronic kidney disease, or unspecified chronic kidney disease: Principal | ICD-10-CM | POA: Diagnosis present

## 2023-10-29 DIAGNOSIS — E1122 Type 2 diabetes mellitus with diabetic chronic kidney disease: Secondary | ICD-10-CM | POA: Diagnosis present

## 2023-10-29 DIAGNOSIS — J918 Pleural effusion in other conditions classified elsewhere: Secondary | ICD-10-CM | POA: Diagnosis present

## 2023-10-29 DIAGNOSIS — Z7984 Long term (current) use of oral hypoglycemic drugs: Secondary | ICD-10-CM | POA: Diagnosis not present

## 2023-10-29 DIAGNOSIS — E785 Hyperlipidemia, unspecified: Secondary | ICD-10-CM | POA: Diagnosis present

## 2023-10-29 DIAGNOSIS — I350 Nonrheumatic aortic (valve) stenosis: Secondary | ICD-10-CM

## 2023-10-29 DIAGNOSIS — Z952 Presence of prosthetic heart valve: Secondary | ICD-10-CM | POA: Diagnosis not present

## 2023-10-29 DIAGNOSIS — I080 Rheumatic disorders of both mitral and aortic valves: Secondary | ICD-10-CM | POA: Diagnosis present

## 2023-10-29 DIAGNOSIS — E039 Hypothyroidism, unspecified: Secondary | ICD-10-CM | POA: Diagnosis present

## 2023-10-29 DIAGNOSIS — I5023 Acute on chronic systolic (congestive) heart failure: Secondary | ICD-10-CM | POA: Diagnosis present

## 2023-10-29 DIAGNOSIS — N179 Acute kidney failure, unspecified: Secondary | ICD-10-CM | POA: Diagnosis present

## 2023-10-29 DIAGNOSIS — I447 Left bundle-branch block, unspecified: Secondary | ICD-10-CM | POA: Diagnosis present

## 2023-10-29 DIAGNOSIS — J90 Pleural effusion, not elsewhere classified: Secondary | ICD-10-CM | POA: Insufficient documentation

## 2023-10-29 DIAGNOSIS — R0602 Shortness of breath: Secondary | ICD-10-CM | POA: Diagnosis not present

## 2023-10-29 DIAGNOSIS — I249 Acute ischemic heart disease, unspecified: Secondary | ICD-10-CM | POA: Diagnosis present

## 2023-10-29 DIAGNOSIS — Z951 Presence of aortocoronary bypass graft: Secondary | ICD-10-CM

## 2023-10-29 DIAGNOSIS — Z006 Encounter for examination for normal comparison and control in clinical research program: Secondary | ICD-10-CM

## 2023-10-29 DIAGNOSIS — F1021 Alcohol dependence, in remission: Secondary | ICD-10-CM | POA: Diagnosis present

## 2023-10-29 DIAGNOSIS — I509 Heart failure, unspecified: Principal | ICD-10-CM

## 2023-10-29 DIAGNOSIS — Z87891 Personal history of nicotine dependence: Secondary | ICD-10-CM | POA: Diagnosis not present

## 2023-10-29 DIAGNOSIS — I480 Paroxysmal atrial fibrillation: Secondary | ICD-10-CM | POA: Diagnosis present

## 2023-10-29 DIAGNOSIS — E119 Type 2 diabetes mellitus without complications: Secondary | ICD-10-CM | POA: Diagnosis not present

## 2023-10-29 DIAGNOSIS — I2510 Atherosclerotic heart disease of native coronary artery without angina pectoris: Secondary | ICD-10-CM | POA: Diagnosis present

## 2023-10-29 DIAGNOSIS — N1831 Chronic kidney disease, stage 3a: Secondary | ICD-10-CM | POA: Diagnosis present

## 2023-10-29 DIAGNOSIS — I5020 Unspecified systolic (congestive) heart failure: Secondary | ICD-10-CM | POA: Diagnosis not present

## 2023-10-29 DIAGNOSIS — I4719 Other supraventricular tachycardia: Secondary | ICD-10-CM | POA: Diagnosis not present

## 2023-10-29 DIAGNOSIS — I25810 Atherosclerosis of coronary artery bypass graft(s) without angina pectoris: Secondary | ICD-10-CM | POA: Diagnosis present

## 2023-10-29 DIAGNOSIS — E1151 Type 2 diabetes mellitus with diabetic peripheral angiopathy without gangrene: Secondary | ICD-10-CM | POA: Diagnosis present

## 2023-10-29 DIAGNOSIS — R54 Age-related physical debility: Secondary | ICD-10-CM | POA: Diagnosis present

## 2023-10-29 DIAGNOSIS — Z79890 Hormone replacement therapy (postmenopausal): Secondary | ICD-10-CM

## 2023-10-29 DIAGNOSIS — Z79899 Other long term (current) drug therapy: Secondary | ICD-10-CM

## 2023-10-29 DIAGNOSIS — I959 Hypotension, unspecified: Secondary | ICD-10-CM | POA: Diagnosis not present

## 2023-10-29 DIAGNOSIS — J9811 Atelectasis: Secondary | ICD-10-CM | POA: Diagnosis present

## 2023-10-29 DIAGNOSIS — Z9861 Coronary angioplasty status: Secondary | ICD-10-CM

## 2023-10-29 DIAGNOSIS — Z7902 Long term (current) use of antithrombotics/antiplatelets: Secondary | ICD-10-CM

## 2023-10-29 DIAGNOSIS — I471 Supraventricular tachycardia, unspecified: Secondary | ICD-10-CM | POA: Insufficient documentation

## 2023-10-29 DIAGNOSIS — I255 Ischemic cardiomyopathy: Secondary | ICD-10-CM | POA: Diagnosis present

## 2023-10-29 DIAGNOSIS — Z955 Presence of coronary angioplasty implant and graft: Secondary | ICD-10-CM | POA: Diagnosis not present

## 2023-10-29 DIAGNOSIS — Z7982 Long term (current) use of aspirin: Secondary | ICD-10-CM

## 2023-10-29 DIAGNOSIS — N183 Chronic kidney disease, stage 3 unspecified: Secondary | ICD-10-CM | POA: Insufficient documentation

## 2023-10-29 DIAGNOSIS — Z7901 Long term (current) use of anticoagulants: Secondary | ICD-10-CM

## 2023-10-29 DIAGNOSIS — E1159 Type 2 diabetes mellitus with other circulatory complications: Secondary | ICD-10-CM | POA: Diagnosis not present

## 2023-10-29 DIAGNOSIS — I25119 Atherosclerotic heart disease of native coronary artery with unspecified angina pectoris: Secondary | ICD-10-CM | POA: Diagnosis not present

## 2023-10-29 DIAGNOSIS — Z96642 Presence of left artificial hip joint: Secondary | ICD-10-CM | POA: Diagnosis present

## 2023-10-29 DIAGNOSIS — I5043 Acute on chronic combined systolic (congestive) and diastolic (congestive) heart failure: Secondary | ICD-10-CM | POA: Diagnosis not present

## 2023-10-29 DIAGNOSIS — E1142 Type 2 diabetes mellitus with diabetic polyneuropathy: Secondary | ICD-10-CM | POA: Diagnosis present

## 2023-10-29 DIAGNOSIS — K5900 Constipation, unspecified: Secondary | ICD-10-CM | POA: Diagnosis present

## 2023-10-29 LAB — BASIC METABOLIC PANEL WITH GFR
Anion gap: 14 (ref 5–15)
BUN: 28 mg/dL — ABNORMAL HIGH (ref 8–23)
CO2: 21 mmol/L — ABNORMAL LOW (ref 22–32)
Calcium: 9.2 mg/dL (ref 8.9–10.3)
Chloride: 98 mmol/L (ref 98–111)
Creatinine, Ser: 1.28 mg/dL — ABNORMAL HIGH (ref 0.61–1.24)
GFR, Estimated: 57 mL/min — ABNORMAL LOW (ref >60)
Glucose, Bld: 165 mg/dL — ABNORMAL HIGH (ref 70–99)
Potassium: 4.6 mmol/L (ref 3.5–5.1)
Sodium: 133 mmol/L — ABNORMAL LOW (ref 135–145)

## 2023-10-29 LAB — CBC
HCT: 35.2 % — ABNORMAL LOW (ref 39.0–52.0)
Hemoglobin: 11.5 g/dL — ABNORMAL LOW (ref 13.0–17.0)
MCH: 28.5 pg (ref 26.0–34.0)
MCHC: 32.7 g/dL (ref 30.0–36.0)
MCV: 87.3 fL (ref 80.0–100.0)
Platelets: 405 K/uL — ABNORMAL HIGH (ref 150–400)
RBC: 4.03 MIL/uL — ABNORMAL LOW (ref 4.22–5.81)
RDW: 12.9 % (ref 11.5–15.5)
WBC: 8.8 K/uL (ref 4.0–10.5)
nRBC: 0 % (ref 0.0–0.2)

## 2023-10-29 LAB — BRAIN NATRIURETIC PEPTIDE: B Natriuretic Peptide: 1155.5 pg/mL — ABNORMAL HIGH (ref 0.0–100.0)

## 2023-10-29 LAB — TROPONIN I (HIGH SENSITIVITY)
Troponin I (High Sensitivity): 258 ng/L (ref <18)
Troponin I (High Sensitivity): 255 ng/L (ref <18)

## 2023-10-29 MED ORDER — HEPARIN SODIUM (PORCINE) 5000 UNIT/ML IJ SOLN
5000.0000 [IU] | Freq: Three times a day (TID) | INTRAMUSCULAR | Status: DC
  Administered 2023-10-30 – 2023-11-06 (×20): 5000 [IU] via SUBCUTANEOUS
  Filled 2023-10-29 (×21): qty 1

## 2023-10-29 MED ORDER — FUROSEMIDE 10 MG/ML IJ SOLN
40.0000 mg | Freq: Once | INTRAMUSCULAR | Status: AC
  Administered 2023-10-29: 40 mg via INTRAVENOUS
  Filled 2023-10-29: qty 4

## 2023-10-29 MED ORDER — SODIUM CHLORIDE 0.9 % IV SOLN: 250.0000 mL | INTRAVENOUS | Status: AC | PRN

## 2023-10-29 MED ORDER — SODIUM CHLORIDE 0.9% FLUSH
3.0000 mL | Freq: Two times a day (BID) | INTRAVENOUS | Status: DC
  Administered 2023-10-30 – 2023-11-08 (×18): 3 mL via INTRAVENOUS

## 2023-10-29 MED ORDER — ACETAMINOPHEN 325 MG PO TABS: 650.0000 mg | ORAL_TABLET | ORAL | Status: DC | PRN

## 2023-10-29 MED ORDER — SODIUM CHLORIDE 0.9% FLUSH: 3.0000 mL | INTRAVENOUS | Status: DC | PRN

## 2023-10-29 MED ORDER — ONDANSETRON HCL 4 MG/2ML IJ SOLN: 4.0000 mg | Freq: Four times a day (QID) | INTRAMUSCULAR | Status: DC | PRN

## 2023-10-29 NOTE — H&P (Signed)
 Cardiology Admission History and Physical:   Patient ID: Luke Harvey MRN: [id number]; DOB: 06/18/1945   Admission date: 10/29/2023  Primary Care Provider: [facility] Primary Cardiologist: Candyce Reek, MD  Primary Electrophysiologist:  None   Chief Complaint:  [place] of Shortness of Breath   Patient Profile:   Luke Harvey is a 78 y.o. male with coronary artery disease status post CABG, multiple PCI's, ischemic cardiomyopathy with a newly reduced EF of 25 to 30% (July 2025), hyperlipidemia, type 2 diabetes, low-flow low gradient aortic stenosis  History of Present Illness:   Mr. Luke Harvey presents to the emergency department for difficulty breathing.  He was recently just admitted with an NSTEMI last week where he had multiple 80 to 90% stenoses in his SVG to his diagonal branch.  He had PCI of his vein graft. Today he describes  several second episodes where he is unable to breathe.   [name] often has shortness of breath with exertion, however the symptoms that he presents with today are not related to his shortness of breath with exertion.  He states there is something different going on.  He calls them being attacks when all of a sudden he feels like he cannot take a breath.  These episodes occur at rest and without provocation. They last 5 to 10 seconds in duration and they resolve on their own. These episodes preceded his PCI last week and have continued with increasing frequency over the last few days. One of these episodes was very well-described during his last admission on October 25, 2023 by a member of the cardiology care team.  In the last week since his discharge, these episodes of become more frequent  His daughter Luke Harvey was not in the room at the time of my evaluation.  However she states that before when she was in the emergency department her father had 4 episodes. During the episodes of telemetry monitor would begin beeping.   Past Medical  History:  Diagnosis Date   Age-related macular degeneration, dry, both eyes    Anxiety    Aortic stenosis    Arthritis    probably all over (06/08/2016)   Cardiomyopathy (HCC)    a. EF 40-45% in 2018, normalized in 03/2020.   Chronic back pain    started in my lower back; going up my back in the last couple months (06/08/2016)   Chronic sinusitis    Coronary artery disease    a. s/p CABGx 2V (2014 in [place])  b. PCI (2015 in [place]). c. PCI 2018 ([facility]). d. PCI 03/2020 (complex - [facility]).   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 06/09/2016   NSTEMI (non-ST elevated myocardial infarction) (HCC) 06/08/2016   Pneumonia    3-4 times maybe (06/08/2016)   PVD (peripheral vascular disease) (HCC)    Recovering alcoholic (HCC)    picked up my 30 year chip the other day (06/08/2016)   S/P angioplasty with stent, 06/08/16 DES, for in-stent restenosis in RCA. 06/09/2016   Sleep apnea    got a  mask after OR; don't use it (06/08/2016)   Type II diabetes mellitus (HCC)     Past Surgical History:  Procedure Laterality Date   ANTERIOR CERVICAL DECOMP/DISCECTOMY FUSION     BACK SURGERY     CARDIAC CATHETERIZATION  03/2012   led to bypass   CAROTID ENDARTERECTOMY Right ~ 2014   COLONOSCOPY W/ BIOPSIES AND POLYPECTOMY     CORONARY ANGIOPLASTY WITH STENT PLACEMENT  2015; 06/08/2016   CORONARY ARTERY BYPASS GRAFT  04/10/2012   CORONARY ATHERECTOMY N/A 04/04/2020   Procedure: CORONARY ATHERECTOMY;  Surgeon: Burnard Debby LABOR, MD;  Location: [facility];  Service: Cardiovascular;  Laterality: N/A;   CORONARY STENT  INTERVENTION N/A 06/08/2016   Procedure: Coronary Stent Intervention;  Surgeon: Debby LABOR Burnard, MD;  Location: [facility];  Service: Cardiovascular;  Laterality: N/A;   CORONARY STENT INTERVENTION N/A 04/02/2020   Procedure: CORONARY STENT INTERVENTION;  Surgeon: Anner Alm ORN, MD;  Location: [facility] INVASIVE CV LAB;  Service: Cardiovascular;  Laterality: N/A;   CORONARY STENT INTERVENTION N/A 04/04/2020   Procedure: CORONARY STENT INTERVENTION;  Surgeon: Burnard Debby LABOR, MD;  Location: [facility];  Service: Cardiovascular;  Laterality: N/A;   CORONARY STENT INTERVENTION N/A 11/16/2021   Procedure: CORONARY STENT INTERVENTION;  Surgeon: Mady Bruckner, MD;  Location: [facility];  Service: Cardiovascular;  Laterality: N/A;   CORONARY STENT INTERVENTION N/A 10/24/2023   Procedure: CORONARY STENT INTERVENTION;  Surgeon: Anner Alm ORN, MD;  Location: [facility] INVASIVE CV LAB;  Service: Cardiovascular;  Laterality: N/A;   CORONARY ULTRASOUND/IVUS N/A 04/04/2020   Procedure: Intravascular Ultrasound/IVUS;  Surgeon: Burnard Debby LABOR, MD;  Location: [facility] INVASIVE CV LAB;  Service: Cardiovascular;  Laterality: N/A;   JOINT REPLACEMENT     LEFT HEART CATH AND CORONARY ANGIOGRAPHY N/A 06/08/2016   Procedure: Left Heart Cath and Coronary Angiography;  Surgeon: Debby LABOR Burnard, MD;  Location: [facility];  Service: Cardiovascular;  Laterality: N/A;   LEFT HEART CATH AND CORS/GRAFTS ANGIOGRAPHY N/A 04/02/2020   Procedure: LEFT HEART CATH AND CORS/GRAFTS ANGIOGRAPHY;  Surgeon: Anner Alm ORN, MD;  Location: [facility] INVASIVE CV LAB;  Service: Cardiovascular;  Laterality: N/A;   LEFT HEART CATH AND CORS/GRAFTS ANGIOGRAPHY N/A 11/16/2021   Procedure: LEFT HEART CATH AND CORS/GRAFTS ANGIOGRAPHY;  Surgeon: Mady Bruckner, MD;  Location: [facility];  Service: Cardiovascular;  Laterality: N/A;   REVISION TOTAL HIP ARTHROPLASTY Left ~ 2004   RIGHT/LEFT HEART CATH AND CORONARY ANGIOGRAPHY N/A  10/24/2023   Procedure: RIGHT/LEFT HEART CATH AND CORONARY ANGIOGRAPHY;  Surgeon: Anner Alm ORN, MD;  Location: [facility] INVASIVE CV LAB;  Service: Cardiovascular;  Laterality: N/A;   TEMPORARY PACEMAKER N/A 04/04/2020   Procedure: TEMPORARY PACEMAKER;  Surgeon: Burnard Debby LABOR, MD;  Location: [facility] INVASIVE CV LAB;  Service: Cardiovascular;  Laterality: N/A;   TONSILLECTOMY     TOTAL HIP ARTHROPLASTY Left 1990s     Medications Prior to Admission: Prior to Admission medications   Medication Sig Start Date End Date Taking? Authorizing Provider  acetaminophen  (TYLENOL ) 500 MG tablet Take 500 mg by mouth 2 (two) times daily.    [provider]  albuterol  (VENTOLIN  HFA) 108 (90 Base) MCG/ACT inhaler Inhale 2 puffs into the lungs every 6 (six) hours as needed for wheezing or shortness of breath.    [provider]  APPLE CIDER VINEGAR PO Take 1 capsule by mouth daily at 12 noon.    [provider]  aspirin   EC 81 MG tablet Take 81 mg by mouth daily at 12 noon.    [provider]  BIOTIN PO Take 1 tablet by mouth daily at 12 noon.    [provider]  carboxymethylcellulose (REFRESH PLUS) 0.5 % SOLN Place 1 drop into both eyes See admin instructions. Administer 1 drop into each eye every daily. May use every 12 hours if needed for dry eyes.    [provider]  Cholecalciferol (VITAMIN D-3 PO) Take 1 tablet by mouth daily at 12 noon.    [provider]  Coenzyme Q10 (COQ10 PO) Take 1 capsule by mouth daily at 12 noon.    [provider]  Cyanocobalamin (VITAMIN B12 PO) Take 1 tablet by mouth daily at 12 noon.    [provider]  diclofenac Sodium (VOLTAREN) 1 % GEL 1 Application every 6 (six) hours as needed for pain. 02/02/22   [provider]  DULoxetine  (CYMBALTA ) 20 MG capsule Take 20 mg by mouth daily at 12 noon.    [provider]  DULoxetine  (CYMBALTA ) 30 MG capsule Take 30 mg by mouth daily at 12  noon. Patient not taking: Reported on 10/24/2023    [provider]  empagliflozin  (JARDIANCE ) 10 MG TABS tablet Take 1 tablet (10 mg total) by mouth daily. 10/27/23   Amin, Ankit C, MD  fluticasone  (FLONASE ) 50 MCG/ACT nasal spray Place 1-2 sprays into both nostrils daily as needed for allergies or rhinitis.    [provider]  isosorbide  mononitrate (IMDUR ) 30 MG 24 hr tablet Take 60 mg by mouth daily at 12 noon.    [provider]  ketoconazole (NIZORAL) 2 % cream Apply 1 Application topically 2 (two) times daily as needed for irritation. 02/23/21   [provider]  ketoconazole (NIZORAL) 2 % shampoo Apply 1 Application topically every 3 (three) days.    [provider]  levOCARNitine (L-CARNITINE PO) Take 2 tablets by mouth daily at 12 noon.    [provider]  levothyroxine  (SYNTHROID ) 75 MCG tablet Take 75 mcg by mouth daily before breakfast. 02/02/22   [provider]  lidocaine  (LIDODERM ) 5 % Place 1 patch onto the skin daily as needed (pain).    [provider]  loratadine  (CLARITIN ) 10 MG tablet Take 10 mg by mouth daily at 12 noon.    [provider]  MAGNESIUM  PO Take 2 tablets by mouth daily at 12 noon.    [provider]  metFORMIN  (GLUCOPHAGE ) 500 MG tablet Take 1,000 mg by mouth 2 (two) times daily as needed (BS > 140). 05/04/06   [provider]  metoprolol  succinate (TOPROL -XL) 50 MG 24 hr tablet Take 1 tablet (50 mg total) by mouth daily. Take with or immediately following a meal. 10/27/23   Amin, Ankit C, MD  MILK THISTLE PO Take 1 tablet by mouth daily at 12 noon.    [provider]  Misc Natural Products (TURMERIC, CURCUMIN, PO) Take 1 tablet by mouth daily at 12 noon.    [provider]  Multiple Vitamins-Minerals (PRESERVISION AREDS 2) CAPS Take 1 capsule by mouth 2 (two) times daily.    [provider]  nitroGLYCERIN  (NITROSTAT ) 0.4 MG SL tablet Place 1  tablet (0.4 mg total) under the tongue See admin instructions. Take 1 tablet (0.4) mg by mouth every day. May take an additional 1 tablet later in the day if  chest pain. 10/26/23   Caleen Burgess BROCKS, MD  OVER THE COUNTER  MEDICATION Take 1 capsule by mouth daily at 12 noon. VisiUltra    [provider]  OVER THE COUNTER MEDICATION Take 1 capsule by mouth daily at 12 noon. blood boost supplement for glucose control    [provider]  Probiotic Product (PROBIOTIC PO) Take 2 capsules by mouth daily at 12 noon.    [provider]  QUERCETIN PO Take 1 tablet by mouth daily at 12 noon.    [provider]  rosuvastatin  (CRESTOR ) 40 MG tablet Take 1 tablet (40 mg total) by mouth daily. Patient taking differently: Take 40 mg by mouth at bedtime. 04/05/20   Dunn, Dayna N, PA-C  Saw Palmetto, Serenoa repens, (SAW PALMETTO PO) Take 1 tablet by mouth daily at 12 noon.    [provider]  SELENIUM PO Take 1 tablet by mouth daily at 12 noon.    [provider]  spironolactone  (ALDACTONE ) 25 MG tablet Take 0.5 tablets (12.5 mg total) by mouth daily. 10/26/23   Amin, Ankit C, MD  TART CHERRY PO Take 1 tablet by mouth daily at 12 noon.    [provider]  ticagrelor  (BRILINTA ) 90 MG TABS tablet Take 1 tablet (90 mg total) by mouth 2 (two) times daily. 04/05/20   Dunn, Dayna N, PA-C     Allergies:    Allergies  Allergen Reactions   Tetracyclines & Related Hives and Itching   Cozaar [Losartan] Cough   Zestril  [Lisinopril ] Cough   Lipitor [Atorvastatin] Other (See Comments)    Myalgias     Social History:   Social History   Socioeconomic History   Marital status: Divorced    Spouse name: Not on file   Number of children: 2   Years of education: Not on file   Highest education level: GED or equivalent  Occupational History   Occupation: Retired  Tobacco Use   Smoking status: Former    Current packs/day: 0.00    Average packs/day: 1 pack/day  for 40.0 years (40.0 ttl pk-yrs)    Types: Cigarettes    Start date: 07/17/1968    Quit date: 07/17/2008    Years since quitting: 15.2   Smokeless tobacco: Never  Substance and Sexual Activity   Alcohol  use: No    Comment: recovering alcoholic, abstinence 30 years.  attends AA weekly   Drug use: No   Sexual activity: Never  Other Topics Concern   Not on file  Social History Narrative   Not on file   Social Drivers of Health   Financial Resource Strain: Not on file  Food Insecurity: No Food Insecurity (10/24/2023)   Hunger Vital Sign    Worried About Running Out of Food in the Last Year: Never true    Ran Out of Food in the Last Year: Never true  Transportation Needs: No Transportation Needs (10/24/2023)   PRAPARE - Administrator, Civil Service (Medical): No    Lack of Transportation (Non-Medical): No  Physical Activity: Not on file  Stress: Not on file  Social Connections: Unknown (10/24/2023)   Social Connection and Isolation Panel    Frequency of Communication with Friends and Family: Never    Frequency of Social Gatherings with Friends and Family: Never    Attends Religious Services: Never    Database administrator or Organizations: Patient declined    Attends Banker Meetings: Patient declined    Marital Status: Patient declined  Intimate Partner Violence: Patient Declined (10/24/2023)   Humiliation, Afraid,  Rape, and Kick questionnaire    Fear of Current or Ex-Partner: Patient declined    Emotionally Abused: Patient declined    Physically Abused: Patient declined    Sexually Abused: Patient declined    Family History:   The patient's family history includes Cancer in his mother.    Review of Systems: [y] = yes, [ ]  = no    General: Weight gain [ ] ; Weight loss [ ] ; Anorexia [ ] ; Fatigue [ ] ; Fever [ ] ; Chills [ ] ; Weakness [ ]   Cardiac: Chest pain/pressure Davis.Dad ]; Resting SOB [ y]; Exertional SOB Davis.Dad ]; Orthopnea [ ] ; Pedal Edema [ ] ;  Palpitations [ ] ; Syncope [ ] ; Presyncope [ y]; Paroxysmal nocturnal dyspnea[ ]   Pulmonary: Cough [ ] ; Wheezing[ ] ; Hemoptysis[ ] ; Sputum [ ] ; Snoring [ ]   GI: Vomiting[ ] ; Dysphagia[ ] ; Melena[ ] ; Hematochezia [ ] ; Heartburn[ ] ; Abdominal pain [ ] ; Constipation [ ] ; Diarrhea [ ] ; BRBPR [ ]   GU: Hematuria[ ] ; Dysuria [ ] ; Nocturia[ ]   Vascular: Pain in legs with walking [ ] ; Pain in feet with lying flat [ ] ; Non-healing sores [ ] ; Stroke [ ] ; TIA [ ] ; Slurred speech [ ] ;  Neuro: Headaches[ ] ; Vertigo[ ] ; Seizures[ ] ; Paresthesias[ ] ;Blurred vision [ ] ; Diplopia [ ] ; Vision changes [ ]   Ortho/Skin: Arthritis [ ] ; Joint pain [ ] ; Muscle pain [ ] ; Joint swelling [ ] ; Back Pain [ ] ; Rash [ ]   Psych: Depression[ ] ; Anxiety[ ]   Heme: Bleeding problems [ ] ; Clotting disorders [ ] ; Anemia [ ]   Endocrine: Diabetes [ ] ; Thyroid dysfunction[ ]   Physical Exam/Data:   Vitals:   10/29/23 2015 10/29/23 2030 10/29/23 2045 10/29/23 2058  BP: 124/63 108/61 118/62   Pulse: 85 81 87   Resp: (!) 29 (!) 29 (!) 26   Temp:    98.2 F (36.8 C)  TempSrc:    Oral  SpO2: 100% 100% 99%   Weight:      Height:        Intake/Output Summary (Last 24 hours) at 10/29/2023 2217 Last data filed at 10/29/2023 2057 Gross per 24 hour  Intake --  Output 350 ml  Net -350 ml   Filed Weights   10/29/23 1706  Weight: 63.3 kg   Body mass index is 22.52 kg/m.  General:  Well nourished, well developed, in no acute distress HEENT: normal Lymph: no adenopathy Neck: JVP of 14 Vascular: No carotid bruits; FA pulses 2+ bilaterally without bruits  Cardiac:  normal S1, S2; unable to auscultate his known murmur Lungs:  clear to auscultation bilaterally Abd: soft, nontender, no hepatomegaly  Ext: no edema Musculoskeletal:  No deformities, BUE and BLE strength normal and equal Skin: warm and dry Psych:  Normal affect    EKG: The EKG was personally reviewed and demonstrated normal sinus rhythm with ST depressions in V4  through V6 and inferiorly.  These findings are similar to EKGs completed on both 10/27/2023 and 10/23/2023.  No significant changes  Laboratory Data:  Chemistry Recent Labs  Lab 10/26/23 0350 10/29/23 1545  NA 135 133*  K 4.9 4.6  CL 101 98  CO2 19* 21*  GLUCOSE 170* 165*  BUN 22 28*  CREATININE 1.22 1.28*  CALCIUM  9.2 9.2  GFRNONAA >60 57*  ANIONGAP 15 14    Recent Labs  Lab 10/23/23 1753  PROT 6.6  ALBUMIN 2.8*  AST 28  ALT 18  ALKPHOS 71  BILITOT 0.5   Hematology  Recent Labs  Lab 10/26/23 1113 10/29/23 1545  WBC 9.4 8.8  RBC 4.55 4.03*  HGB 12.7* 11.5*  HCT 38.4* 35.2*  MCV 84.4 87.3  MCH 27.9 28.5  MCHC 33.1 32.7  RDW 12.5 12.9  PLT 415* 405*   Cardiac EnzymesNo results for input(s): TROPONINI in the last 168 hours. No results for input(s): TROPIPOC in the last 168 hours.  BNP Recent Labs  Lab 10/23/23 1730 10/29/23 1930  BNP 873.7* 1,155.5*    DDimer No results for input(s): DDIMER in the last 168 hours.  Radiology/Studies:  DG Chest Portable 1 View Result Date: 10/29/2023 CLINICAL DATA:  Shortness of breath EXAM: PORTABLE CHEST 1 VIEW COMPARISON:  10/23/2023 FINDINGS: Prior CABG. Heart borderline in size. Small to moderate right pleural effusion. Diffuse interstitial and alveolar opacities have increased concerning for edema. No acute bony abnormality. IMPRESSION: Diffuse interstitial and alveolar opacities, worsening since prior study concerning for edema. Small to moderate right pleural effusion. Electronically Signed   By: Franky Crease M.D.   On: 10/29/2023 17:35    Assessment and Plan:   Episodic Shortness of Breath  Several of these episodes were witnessed by the emergency department staff and were described as quite alarming to witness. These breath attack episodes appear to be correlating with arrhythmia seen on telemetry. On first glance the rhythms in question do appear to be sinus, however his heart rate acutely drops and subsequently  acutely rises less suggestive of sinus rhythm (General Telemetry review from emergency department pasted above). The P wave morphology is different (albeit same axis) I wonder if he is having brief paroxysmal ectopic atrial rhythms that he is not able to tolerate the rapid change in heart rate due to his severe low-flow low gradient aortic stenosis.  Perhaps the breathing attacks would lessen in the degree of severity felt if he no longer had aortic stenosis.  This is not a straightforward presentation and will discuss with both EP and the structural heart team.  Low-flow Low-Gradient Aortic Stenosis  [name] 0.93, peak velocity 2.8 m/s, mean gradient 17, Svi 26 Outpatient TAVR evaluation scheduled  CAD s/p CABG with prior native vessel and SVG PCIs (SVG-Diagonal PCI 10/24/2023) Troponin elevation on arrival (258, 255, 298).  EKGs reviewed.  Left ventricular hypertrophy present with persistent ST depression V4 through V6 seen on prior EKGs. There is less than half a millimeter of depression in leads II, III and aVF that were not present on his EKG from 10/27/2023 but were present on 10/23/2023. I do not think that this is a new ACS. Upon presentation, he had had the symptoms for the last few days and his initial troponin was 258 followed by 255 which is relatively flat. 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 Inpatient status is judged to be reasonable and necessary in order to provide the required intensity of  service to ensure the patient's safety. The patient's presenting symptoms, physical exam findings, and initial radiographic and laboratory data in the context of their chronic comorbidities is felt to place them at high risk for further clinical deterioration. Furthermore, it is not anticipated that the patient will be medically stable for discharge from the hospital within 2 midnights of admission.   * I certify that at the point of admission it is my clinical judgment that the patient will require inpatient hospital care spanning beyond 2 midnights from the point of admission due to high intensity of service, high risk for further deterioration and high frequency of surveillance required.*   For questions or updates, please contact [facility] Please consult [email or web address] for contact info under        Signed, Merlene JAYSON Blood, MD  10/29/2023 10:17 PM

## 2023-10-29 NOTE — ED Triage Notes (Signed)
 Unable to move pts chart someone is in his chart preventing the movement

## 2023-10-29 NOTE — ED Notes (Signed)
 Cardiology at beside.

## 2023-10-29 NOTE — ED Triage Notes (Signed)
 The pt is c/o shortness of breath for one week  he was just admitted last  weekend had a cardiac cath  and had 2 stents placed then was sent home  he has had exertional sob and weakness getting worse for 2 days

## 2023-10-29 NOTE — ED Provider Notes (Signed)
 [place] EMERGENCY DEPARTMENT AT [facility] Provider Note   CSN: [id number] Arrival date & time: 10/29/23  1649     Patient presents with: Shortness of Breath   Luke Harvey is a 78 y.o. male.   HPI 78 y.o. male with medical history significant of coronary artery disease status post CABG, multiple PCI, ischemic cardiomyopathy with EF of 40 to 45%, essential hypertension, hyperlipidemia, type 2 diabetes, aortic stenosis, DVT. Patient is experiencing increasing episodes of shortness of breath.  These are occurring quite abruptly and more frequently.  He reports that when they occur he feels like he cannot even get of breath.  Patient denies he is having any increasing chest pain at this time.  No increasing swelling of the extremities.  No fever no productive cough.  Patient was recently discharged from the hospital with NSTEMI and systolic heart failure and moderate to severe aortic stenosis.    Prior to Admission medications   Medication Sig Start Date End Date Taking? Authorizing Provider  acetaminophen  (TYLENOL ) 500 MG tablet Take 500 mg by mouth 2 (two) times daily.    [provider]  albuterol  (VENTOLIN  HFA) 108 (90 Base) MCG/ACT inhaler Inhale 2 puffs into the lungs every 6 (six) hours as needed for wheezing or shortness of breath.    [provider]  APPLE CIDER VINEGAR PO Take 1 capsule by mouth daily at 12 noon.    [provider]  aspirin  EC 81 MG tablet Take 81 mg by mouth daily at 12 noon.    [provider]  BIOTIN PO Take 1 tablet by mouth daily at 12 noon.    [provider]  carboxymethylcellulose (REFRESH PLUS) 0.5 % SOLN Place 1 drop into both eyes See admin instructions. Administer 1 drop into each eye every daily. May use every 12 hours if needed for dry eyes.    [provider]  Cholecalciferol (VITAMIN D-3 PO) Take 1 tablet by mouth daily at 12 noon.    [provider]  Coenzyme Q10  (COQ10 PO) Take 1 capsule by mouth daily at 12 noon.    [provider]  Cyanocobalamin (VITAMIN B12 PO) Take 1 tablet by mouth daily at 12 noon.    [provider]  diclofenac Sodium (VOLTAREN) 1 % GEL 1 Application every 6 (six) hours as needed for pain. 02/02/22   [provider]  DULoxetine  (CYMBALTA ) 20 MG capsule Take 20 mg by mouth daily at 12 noon.    [provider]  DULoxetine  (CYMBALTA ) 30 MG capsule Take 30 mg by mouth daily at 12 noon. Patient not taking: Reported on 10/24/2023    [provider]  empagliflozin  (JARDIANCE ) 10 MG TABS tablet Take 1 tablet (10 mg total) by mouth daily. 10/27/23   Amin, Ankit C, MD  fluticasone  (FLONASE ) 50 MCG/ACT nasal spray Place 1-2 sprays into both nostrils daily as needed for allergies or rhinitis.    [provider]  isosorbide  mononitrate (IMDUR ) 30 MG 24 hr tablet Take 60 mg by mouth daily at 12 noon.    [provider]  ketoconazole (NIZORAL) 2 % cream Apply 1 Application topically 2 (two) times daily as needed for irritation. 02/23/21   [provider]  ketoconazole (NIZORAL) 2 % shampoo Apply 1 Application topically every 3 (three) days.    [provider]  levOCARNitine (L-CARNITINE PO) Take 2 tablets by mouth daily at 12 noon.    [provider]  levothyroxine  (SYNTHROID ) 75  MCG tablet Take 75 mcg by mouth daily before breakfast. 02/02/22   [provider]  lidocaine  (LIDODERM ) 5 % Place 1 patch onto the skin daily as needed (pain).    [provider]  loratadine  (CLARITIN ) 10 MG tablet Take 10 mg by mouth daily at 12 noon.    [provider]  MAGNESIUM  PO Take 2 tablets by mouth daily at 12 noon.    [provider]  metFORMIN  (GLUCOPHAGE ) 500 MG tablet Take 1,000 mg by mouth 2 (two) times daily as needed (BS > 140). 05/04/06   [provider]  metoprolol  succinate (TOPROL -XL) 50 MG 24 hr tablet Take 1 tablet (50  mg total) by mouth daily. Take with or immediately following a meal. 10/27/23   Amin, Ankit C, MD  MILK THISTLE PO Take 1 tablet by mouth daily at 12 noon.    [provider]  Misc Natural Products (TURMERIC, CURCUMIN, PO) Take 1 tablet by mouth daily at 12 noon.    [provider]  Multiple Vitamins-Minerals (PRESERVISION AREDS 2) CAPS Take 1 capsule by mouth 2 (two) times daily.    [provider]  nitroGLYCERIN  (NITROSTAT ) 0.4 MG SL tablet Place 1 tablet (0.4 mg total) under the tongue See admin instructions. Take 1 tablet (0.4) mg by mouth every day. May take an additional 1 tablet later in the day if  chest pain. 10/26/23   Amin, Ankit C, MD  OVER THE COUNTER MEDICATION Take 1 capsule by mouth daily at 12 noon. VisiUltra    [provider]  OVER THE COUNTER MEDICATION Take 1 capsule by mouth daily at 12 noon. blood boost supplement for glucose control    [provider]  Probiotic Product (PROBIOTIC PO) Take 2 capsules by mouth daily at 12 noon.    [provider]  QUERCETIN PO Take 1 tablet by mouth daily at 12 noon.    [provider]  rosuvastatin  (CRESTOR ) 40 MG tablet Take 1 tablet (40 mg total) by mouth daily. Patient taking differently: Take 40 mg by mouth at bedtime. 04/05/20   Dunn, Dayna N, PA-C  Saw Palmetto, Serenoa repens, (SAW PALMETTO PO) Take 1 tablet by mouth daily at 12 noon.    [provider]  SELENIUM PO Take 1 tablet by mouth daily at 12 noon.    [provider]  spironolactone  (ALDACTONE ) 25 MG tablet Take 0.5 tablets (12.5 mg total) by mouth daily. 10/26/23   Amin, Ankit C, MD  TART CHERRY PO Take 1 tablet by mouth daily at 12 noon.    [provider]  ticagrelor  (BRILINTA ) 90 MG TABS tablet Take 1 tablet (90 mg total) by mouth 2 (two) times daily. 04/05/20   Dunn, Dayna N, PA-C    Allergies: Tetracyclines & related, Cozaar [losartan], Zestril  [lisinopril ], and Lipitor [atorvastatin]     Review of Systems  Updated Vital Signs BP 118/62   Pulse 87   Temp 98.2 F (36.8 C) (Oral)   Resp (!) 26   Ht 5' 6 (1.676 m)   Wt 63.3 kg   SpO2 99%   BMI 22.52 kg/m   Physical Exam Constitutional:      Comments: Alert.  No acute distress.  HENT:     Mouth/Throat:     Pharynx: Oropharynx is clear.  Eyes:     Extraocular Movements: Extraocular movements intact.  Cardiovascular:     Rate and Rhythm: Normal rate and regular rhythm.  Pulmonary:     Effort:  Pulmonary effort is normal.     Breath sounds: Normal breath sounds.  Abdominal:     General: There is no distension.     Palpations: Abdomen is soft.     Tenderness: There is no abdominal tenderness. There is no guarding.  Musculoskeletal:        General: No swelling or tenderness.     Right lower leg: No edema.     Left lower leg: No edema.  Skin:    General: Skin is warm and dry.  Neurological:     General: No focal deficit present.     Mental Status: He is oriented to person, place, and time.     Motor: No weakness.     Coordination: Coordination normal.     (all labs ordered are listed, but only abnormal results are displayed) Labs Reviewed  BASIC METABOLIC PANEL WITH GFR - Abnormal; Notable for the following components:      Result Value   Sodium 133 (*)    CO2 21 (*)    Glucose, Bld 165 (*)    BUN 28 (*)    Creatinine, Ser 1.28 (*)    GFR, Estimated 57 (*)    All other components within normal limits  CBC - Abnormal; Notable for the following components:   RBC 4.03 (*)    Hemoglobin 11.5 (*)    HCT 35.2 (*)    Platelets 405 (*)    All other components within normal limits  BRAIN NATRIURETIC PEPTIDE - Abnormal; Notable for the following components:   B Natriuretic Peptide 1,155.5 (*)    All other components within normal limits  TROPONIN I (HIGH SENSITIVITY) - Abnormal; Notable for the following components:   Troponin I (High Sensitivity) 258 (*)    All other components within normal limits   TROPONIN I (HIGH SENSITIVITY) - Abnormal; Notable for the following components:   Troponin I (High Sensitivity) 255 (*)    All other components within normal limits  TROPONIN I (HIGH SENSITIVITY)    EKG: EKG Interpretation Date/Time:  Saturday October 29 2023 18:28:14 EDT Ventricular Rate:  93 PR Interval:    QRS Duration:  131 QT Interval:  396 QTC Calculation: 493 R Axis:   66  Text Interpretation: Atrial fibrillation Probable LVH with secondary repol abnrm Anterior ST elevation, probably due to LVH Borderline prolonged QT interval similar to tracing earlier same day Confirmed by Armenta Canning [phone]) on 10/29/2023 9:33:33 PM  Radiology: [facility] Chest Portable 1 View Result Date: 10/29/2023 CLINICAL DATA:  Shortness of breath EXAM: PORTABLE CHEST 1 VIEW COMPARISON:  10/23/2023 FINDINGS: Prior CABG. Heart borderline in size. Small to moderate right pleural effusion. Diffuse interstitial and alveolar opacities have increased concerning for edema. No acute bony abnormality. IMPRESSION: Diffuse interstitial and alveolar opacities, worsening since prior study concerning for edema. Small to moderate right pleural effusion. Electronically Signed   By: Franky Crease M.D.   On: 10/29/2023 17:35     Procedures  CRITICAL CARE Performed by: Canning Armenta   Total critical care time: 45 minutes  Critical care time was exclusive of separately billable procedures and treating other patients.  Critical care was necessary to treat or prevent imminent or life-threatening deterioration.  Critical care was time spent personally by me on the following activities: development of treatment plan with patient and/or surrogate as well as nursing, discussions with consultants, evaluation of patient's response to treatment, examination of patient, obtaining history from patient or surrogate, ordering and performing treatments and interventions,  ordering and review of laboratory studies, ordering and review of  radiographic studies, pulse oximetry and re-evaluation of patient's condition.  Medications Ordered in the ED  furosemide  (LASIX ) injection 40 mg (40 mg Intravenous Given 10/29/23 1946)                                    Medical Decision Making Amount and/or Complexity of Data Reviewed Labs: ordered. Radiology: ordered.  Risk Prescription drug management. Decision regarding hospitalization.  Patient presents as outlined.  He has significant known coronary artery disease as well as aortic stenosis with history of congestive heart failure.  On physical exam he does have some crackles present and chest x-ray shows vascular congestion.  Will administer Lasix  40 mg IV.  Consult: EKG reviewed with Dr. Sherril.  We reviewed the EKGs and at this time Dr. Sherril advises does not meet STEMI criteria.  Recommendations to continue workup and can consult cardiology pending additional workup.  Troponin 255.  BNP 1155.  GFR 57.  White count 8.8.  H&H 11 and 35.  Consult: Dr.Osude for cardiology.  Recommendation for admission to medical service.  Patient is symptomatic with aortic stenosis and tachycardic episodes.  Agrees with diuresis and admission.  Consult: Dr. Segars for admission to hospital service.     Final diagnoses:  Congestive heart failure, unspecified HF chronicity, unspecified heart failure type [facility])  ACS (acute coronary syndrome) [facility])    ED Discharge Orders     None          Armenta Canning, MD 10/29/23 2141

## 2023-10-29 NOTE — Progress Notes (Signed)
 Patient arrived to room 3E17 from ED.  Assessment complete, VS obtained, and Admission database began.

## 2023-10-29 NOTE — ED Notes (Signed)
 Assisted pt to stand assisted at bedside to use urinal.

## 2023-10-30 DIAGNOSIS — I5020 Unspecified systolic (congestive) heart failure: Secondary | ICD-10-CM

## 2023-10-30 DIAGNOSIS — I350 Nonrheumatic aortic (valve) stenosis: Secondary | ICD-10-CM

## 2023-10-30 DIAGNOSIS — I25119 Atherosclerotic heart disease of native coronary artery with unspecified angina pectoris: Secondary | ICD-10-CM

## 2023-10-30 LAB — CBC
HCT: 36.9 % — ABNORMAL LOW (ref 39.0–52.0)
Hemoglobin: 12.1 g/dL — ABNORMAL LOW (ref 13.0–17.0)
MCH: 28.1 pg (ref 26.0–34.0)
MCHC: 32.8 g/dL (ref 30.0–36.0)
MCV: 85.6 fL (ref 80.0–100.0)
Platelets: 401 K/uL — ABNORMAL HIGH (ref 150–400)
RBC: 4.31 MIL/uL (ref 4.22–5.81)
RDW: 12.9 % (ref 11.5–15.5)
WBC: 7.9 K/uL (ref 4.0–10.5)
nRBC: 0 % (ref 0.0–0.2)

## 2023-10-30 LAB — BASIC METABOLIC PANEL WITH GFR
Anion gap: 13 (ref 5–15)
BUN: 29 mg/dL — ABNORMAL HIGH (ref 8–23)
CO2: 24 mmol/L (ref 22–32)
Calcium: 9.2 mg/dL (ref 8.9–10.3)
Chloride: 97 mmol/L — ABNORMAL LOW (ref 98–111)
Creatinine, Ser: 1.46 mg/dL — ABNORMAL HIGH (ref 0.61–1.24)
GFR, Estimated: 49 mL/min — ABNORMAL LOW (ref >60)
Glucose, Bld: 224 mg/dL — ABNORMAL HIGH (ref 70–99)
Potassium: 4.4 mmol/L (ref 3.5–5.1)
Sodium: 134 mmol/L — ABNORMAL LOW (ref 135–145)

## 2023-10-30 LAB — TROPONIN I (HIGH SENSITIVITY): Troponin I (High Sensitivity): 298 ng/L (ref <18)

## 2023-10-30 MED ORDER — ROSUVASTATIN CALCIUM 20 MG PO TABS
40.0000 mg | ORAL_TABLET | Freq: Every day | ORAL | Status: DC
  Administered 2023-10-30 – 2023-11-08 (×9): 40 mg via ORAL
  Filled 2023-10-30 (×4): qty 2
  Filled 2023-10-30: qty 8
  Filled 2023-10-30 (×4): qty 2

## 2023-10-30 MED ORDER — TICAGRELOR 90 MG PO TABS
90.0000 mg | ORAL_TABLET | Freq: Two times a day (BID) | ORAL | Status: DC
  Administered 2023-10-30 – 2023-11-01 (×5): 90 mg via ORAL
  Filled 2023-10-30 (×5): qty 1

## 2023-10-30 MED ORDER — SPIRONOLACTONE 12.5 MG HALF TABLET
12.5000 mg | ORAL_TABLET | Freq: Every day | ORAL | Status: DC
  Administered 2023-10-30 – 2023-11-01 (×3): 12.5 mg via ORAL
  Filled 2023-10-30 (×4): qty 1

## 2023-10-30 MED ORDER — FUROSEMIDE 10 MG/ML IJ SOLN
40.0000 mg | Freq: Every day | INTRAMUSCULAR | Status: DC
  Administered 2023-10-31: 40 mg via INTRAVENOUS
  Filled 2023-10-30: qty 4

## 2023-10-30 MED ORDER — EMPAGLIFLOZIN 10 MG PO TABS
10.0000 mg | ORAL_TABLET | Freq: Every day | ORAL | Status: DC
  Administered 2023-10-30: 10 mg via ORAL
  Filled 2023-10-30: qty 1

## 2023-10-30 MED ORDER — ISOSORBIDE MONONITRATE ER 30 MG PO TB24
30.0000 mg | ORAL_TABLET | Freq: Every day | ORAL | Status: DC
  Administered 2023-10-30 – 2023-11-01 (×3): 30 mg via ORAL
  Filled 2023-10-30 (×4): qty 1

## 2023-10-30 MED ORDER — ISOSORBIDE MONONITRATE ER 60 MG PO TB24: 60.0000 mg | ORAL_TABLET | Freq: Every day | ORAL | Status: DC

## 2023-10-30 MED ORDER — METOPROLOL SUCCINATE ER 50 MG PO TB24: 50.0000 mg | ORAL_TABLET | Freq: Every day | ORAL | Status: DC

## 2023-10-30 MED ORDER — ASPIRIN 81 MG PO TBEC
81.0000 mg | DELAYED_RELEASE_TABLET | Freq: Every day | ORAL | Status: DC
  Administered 2023-10-30 – 2023-11-07 (×8): 81 mg via ORAL
  Filled 2023-10-30 (×8): qty 1

## 2023-10-30 MED ORDER — METOPROLOL SUCCINATE ER 25 MG PO TB24
25.0000 mg | ORAL_TABLET | Freq: Every day | ORAL | Status: DC
  Administered 2023-10-30 – 2023-11-01 (×3): 25 mg via ORAL
  Filled 2023-10-30 (×3): qty 1

## 2023-10-30 MED ORDER — LORATADINE 10 MG PO TABS
10.0000 mg | ORAL_TABLET | Freq: Every day | ORAL | Status: DC
  Administered 2023-10-30 – 2023-11-07 (×8): 10 mg via ORAL
  Filled 2023-10-30 (×8): qty 1

## 2023-10-30 MED ORDER — LEVOTHYROXINE SODIUM 75 MCG PO TABS
75.0000 ug | ORAL_TABLET | Freq: Every day | ORAL | Status: DC
  Administered 2023-10-30 – 2023-11-08 (×9): 75 ug via ORAL
  Filled 2023-10-30 (×9): qty 1

## 2023-10-30 MED ORDER — FLUTICASONE PROPIONATE 50 MCG/ACT NA SUSP: 1.0000 | Freq: Every day | NASAL | Status: DC | PRN

## 2023-10-30 MED ORDER — DULOXETINE HCL 30 MG PO CPEP
30.0000 mg | ORAL_CAPSULE | Freq: Every day | ORAL | Status: DC
  Administered 2023-10-30 – 2023-11-07 (×9): 30 mg via ORAL
  Filled 2023-10-30 (×9): qty 1

## 2023-10-30 NOTE — Progress Notes (Addendum)
 Progress Note  Patient Name: Luke Harvey Date of Encounter: 10/30/2023  Primary Cardiologist: Lonni LITTIE Nanas, MD  Interval Summary  Chart reviewed.  Discussed symptoms with patient.  He was just recently discharged after vein graft PCI with plan for outpatient TAVR evaluation as well.  He comes back to the hospital reporting both unpredictable episodes of breathlessness occurring at rest and also continuing dyspnea on exertion.  He is observed to have variations in heart rate when these episodes occur, typically slows to sinus bradycardia and then back to sinus tachycardia.  Almost seems potentially hyper-vagal, but without obvious precipitant.  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  He has had no definite pauses.  No syncope.  His presenting chest x-ray shows a right pleural effusion and also interstitial edema.  Vital Signs  Vitals:   10/29/23 2215 10/29/23 2256 10/29/23 2315 10/29/23 2340  BP: 108/63 95/75 119/81 121/74  Pulse: 86 81 79   Resp: (!) 26 16 (!) 23 19  Temp:  98.2 F (36.8 C) 97.7 F (36.5 C)   TempSrc:  Oral Oral   SpO2: 100% 100% 100%   Weight:   63.4 kg   Height:   5' 6.5 (1.689 m)     Intake/Output Summary (Last 24 hours) at 10/30/2023 0751 Last data filed at 10/30/2023 0600 Gross per 24 hour  Intake 240 ml  Output 1350 ml  Net -1110 ml   Filed Weights   10/29/23 1706 10/29/23 2315  Weight: 63.3 kg 63.4 kg    Physical Exam  GEN: No acute distress.  Appears comfortable on examination today. Neck: No JVD. Cardiac: RRR with 2/6 systolic murmur, no murmur, rub, or gallop.  Respiratory: Decreased breath sounds right base, no pleural rub. GI: Soft, nontender, bowel sounds present. MS: No edema. Neuro:  Nonfocal. Psych: Alert and oriented x 3. Normal affect.  ECG/Telemetry  An ECG dated 10/29/2023 was personally reviewed today and demonstrated:  Sinus tachycardia with IVCD of left bundle branch block  type.  Labs  Chemistry Recent Labs  Lab 10/23/23 1753 10/24/23 1712 10/26/23 0350 10/29/23 1545 10/30/23 0350  NA  --    < > 135 133* 134*  K  --    < > 4.9 4.6 4.4  CL  --    < > 101 98 97*  CO2  --    < > 19* 21* 24  GLUCOSE  --    < > 170* 165* 224*  BUN  --    < > 22 28* 29*  CREATININE  --    < > 1.22 1.28* 1.46*  CALCIUM   --    < > 9.2 9.2 9.2  PROT 6.6  --   --   --   --   ALBUMIN 2.8*  --   --   --   --   AST 28  --   --   --   --   ALT 18  --   --   --   --   ALKPHOS 71  --   --   --   --   BILITOT 0.5  --   --   --   --   GFRNONAA  --    < > >60 57* 49*  ANIONGAP  --    < > 15 14 13    < > = values in this interval not displayed.    Hematology Recent Labs  Lab 10/26/23 1113 10/29/23 1545  10/30/23 0350  WBC 9.4 8.8 7.9  RBC 4.55 4.03* 4.31  HGB 12.7* 11.5* 12.1*  HCT 38.4* 35.2* 36.9*  MCV 84.4 87.3 85.6  MCH 27.9 28.5 28.1  MCHC 33.1 32.7 32.8  RDW 12.5 12.9 12.9  PLT 415* 405* 401*   Cardiac Enzymes Recent Labs  Lab 10/23/23 1620 10/23/23 1753 10/29/23 1545 10/29/23 1823 10/30/23 0041  TROPONINIHS 525* 520* 258* 255* 298*   Lipid Panel     Component Value Date/Time   CHOL 134 11/24/2021 1042   TRIG 123 11/24/2021 1042   HDL 40 11/24/2021 1042   CHOLHDL 3.4 11/24/2021 1042   CHOLHDL 3.8 04/02/2020 0740   VLDL 17 04/02/2020 0740   LDLCALC 72 11/24/2021 1042   LABVLDL 22 11/24/2021 1042    Cardiac Studies  Cardiac catheterization 29-Oct-2023:   Mid LM to Prox LAD lesion is 95% stenosed with 90% stenosed side branch in Ost Cx to Prox Cx.   Prox LAD to Mid LAD lesion is 100% stenosed with 99% stenosed side branch in 2nd Diag.   Previously placed mid RCA to Dist RCA DES stent is 30% stenosed.  Previously placed Prox RCA DES stent of is widely patent.   LIMA-LAD and is very large, and existing no disease.  Retrograde flow to the LAD fills a diagonal branch and antegrade flow reaches the apex.  No competitive flow.   2nd Mrg lesion is 100%  stenosed.   Prox Graft to Mid Graft lesion is 90% stenosed.  (Lesion #3)   A drug-eluting stent was successfully placed, overlapping with previous distal stent and covering the lesion, using a STENT SYNERGY XD 3.0X32.  Deployed to 3.3 mm with stent balloon.   Post intervention, there is a 0% residual stenosis.   Mid Graft lesion is 80% stenosed (lesion #4.)-Focal in-stent restenosis but otherwise the stent is widely patent.  Scoring balloon angioplasty was performed using a BALLOON SCOREFLEX 3.0X10.  Post intervention, there is a 10% residual stenosis.   Dist Graft lesion is 45% stenosed.   Prox Graft-2 lesion is 60% stenosed.  Prox Graft-1 lesion is 25% stenosed.   A drug-eluting stent was successfully placed overlapping the previous ostial and proximal stent and the newly placed stent, using a STENT SYNERGY XD 3.0X24.  Deployed to 3.3 mm. Post intervention, there is a 0% residual stenosis.   Postintervention, there is TIMI-3 flow maintained throughout the SVG-OM 2 graft   ---------------------------------------   Hemodynamic findings consistent with mild pulmonary hypertension.   There is moderate aortic valve stenosis.  Mean gradient estimated 24.3 mmHg.  Trivial from estimated based on right heart cath numbers                                     POST-CATH DIAGNOSES Severe Native Left Coronary Artery Disease with essentially occluded LCx and LAD, very small caliber ramus and OM1 remain within 90% distal LM stenosis. Patent RCA stents with mild stenosis between the stents but otherwise stable flow. Patent LIMA to LAD SVG-OM has ostial 70% stenosis, mid graft there is tandem 60 and 90% stenosis as well as 80% ISR in the proximal edge of the more distal stent. - Successful score flex angioplasty of the ISR and PTCA-DES PCI of the vein graft connecting the 2 previous stent using 2 overlapping Synergy Stents (3.0 mm x 32 Owen 3.0 mm x 24 mm with all stents deployed to roughly 3.3  mm using stent balloon  for overlap post dilation all the way to the ostium.   Right Heart Cath:  RAP mean 12 mmHg, RVEDP-EDP 58/10-15 mmHg; PAP-mean 43/19-29 mmHg, PCWP 17 mmHg;  LV P-EDP 162/1-23 mmHg, AO P-MAP 126/50-81 mmHg. AoV: PSV gradient 38 mm 3 with a mean of 24 mmHg. (264 ms)-moderate stenosis. Ao sat 98%, PA sat 61%. Fick Cardiac Output-Index 3.76-2.17-moderately reduced.   Echocardiogram 10/25/2023:  1. There is no left ventricular thrombus (Definity  contrast used).  Findings suggest infarction/scar versus severe resting ischemia in the  mid-distal LAD artery distribution and ischemia/partial scar in the  proximal LAD and in the entire right coronary  artery distribution. Left ventricular ejection fraction, by estimation, is  25 to 30%. The left ventricle has moderate to severely decreased function.  The left ventricle demonstrates regional wall motion abnormalities (see  scoring diagram/findings for  description). Left ventricular diastolic parameters are consistent with  Grade II diastolic dysfunction (pseudonormalization). Elevated left atrial  pressure. There is akinesis of the left ventricular, mid-apical  anterolateral wall, anterior wall and  anteroseptal wall. There is severe hypokinesis of the left ventricular,  basal anteroseptal wall and anterior wall. There is moderate hypokinesis  of the left ventricular, basal-mid inferior wall and inferoseptal wall.   2. Right ventricular systolic function is normal. The right ventricular  size is normal. There is normal pulmonary artery systolic pressure. The  estimated right ventricular systolic pressure is 32.2 mmHg.   3. Left atrial size was moderately dilated.   4. The mitral valve is degenerative. Mild to moderate mitral valve  regurgitation. No evidence of mitral stenosis. Moderate to severe mitral  annular calcification.   5. Tricuspid valve regurgitation is mild to moderate.   6. The right and left aortic cusps are virtually immobile  (fused?), while  the posterior noncoronary cusp has almost normal mobility. Hemodynamics  are consistet with moderate to severe low flow-low gradient aortic  stenosis. The aortic valve has an  indeterminant number of cusps. There is moderate calcification of the  aortic valve. There is moderate thickening of the aortic valve. Aortic  valve regurgitation is moderate. Moderate to severe aortic valve stenosis.  Aortic valve mean gradient measures  17.7 mmHg. Aortic valve Vmax measures 2.81 m/s. Aortic valve acceleration  time measures 95 msec.   7. The inferior vena cava is normal in size with greater than 50%  respiratory variability, suggesting right atrial pressure of 3 mmHg.   Assessment & Plan  1.  Dyspnea both with exertion and also more limited and unpredictable episodes at rest.  No definite arrhythmia associated, however he does have variations in heart rate during some of his symptoms alternating from a slow to sinus bradycardia back up to sinus tachycardia.  He has had no pauses.  Would not be surprising that he has conduction system disease in the setting of aortic stenosis so need to continue to observe telemetry.  His symptoms are most likely multifactorial and aortic stenosis may also be playing more of a role.  2.  Multivessel CAD status post CABG in 2014 with LIMA to LAD and SVG to circumflex/OM as well as DES to RCA.  Subsequently underwent PCI to SVG to OM in 2023 and just recently DES x 2 to SVG to OM on June 30 in the setting of NSTEMI.  LIMA to LAD patent with otherwise severe underlying native CAD.  High-sensitivity troponin I levels remain elevated in the 200s, but in flat pattern not suggestive of  ACS.  3.  HFrEF, LVEF 25 to 30% with evidence of ischemic cardiomyopathy.  Follow-up chest x-ray yesterday showed a small to moderate size right pleural effusion as well as interstitial edema.  4.  Moderate to severe, low-flow/low gradient aortic stenosis.  Mean AV gradient  approximately 18 mmHg and dimensionless index 0.29.  Already scheduled for outpatient evaluation in the structural heart clinic for assessment of TAVR.  5.  AKI, creatinine up to 1.46 with GFR 49.  Discussed with patient.  Would continue to observe telemetry, will ask structural heart team to see tomorrow as an inpatient as well.  Continue aspirin  81 mg daily, Jardiance  10 mg daily, Crestor  40 mg daily, Brilinta  90 mg twice daily, and Aldactone  12.5 mg daily.  Decrease Toprol -XL to 25 mg daily for now.  Add back Imdur  at 30 mg daily.  He received a dose of IV Lasix  last evening, will repeat daily starting tomorrow.  Recheck BMET in AM.  For questions or updates, please contact [facility] Please consult [email or web address] for contact info under   Signed, Jayson Sierras, MD  10/30/2023, 7:51 AM

## 2023-10-31 ENCOUNTER — Inpatient Hospital Stay (HOSPITAL_COMMUNITY)

## 2023-10-31 LAB — BASIC METABOLIC PANEL WITH GFR
Anion gap: 13 (ref 5–15)
BUN: 26 mg/dL — ABNORMAL HIGH (ref 8–23)
CO2: 23 mmol/L (ref 22–32)
Calcium: 9.1 mg/dL (ref 8.9–10.3)
Chloride: 100 mmol/L (ref 98–111)
Creatinine, Ser: 1.3 mg/dL — ABNORMAL HIGH (ref 0.61–1.24)
GFR, Estimated: 56 mL/min — ABNORMAL LOW (ref >60)
Glucose, Bld: 158 mg/dL — ABNORMAL HIGH (ref 70–99)
Potassium: 4.1 mmol/L (ref 3.5–5.1)
Sodium: 136 mmol/L (ref 135–145)

## 2023-10-31 LAB — CBC
HCT: 36.8 % — ABNORMAL LOW (ref 39.0–52.0)
Hemoglobin: 11.8 g/dL — ABNORMAL LOW (ref 13.0–17.0)
MCH: 28.1 pg (ref 26.0–34.0)
MCHC: 32.1 g/dL (ref 30.0–36.0)
MCV: 87.6 fL (ref 80.0–100.0)
Platelets: 398 K/uL (ref 150–400)
RBC: 4.2 MIL/uL — ABNORMAL LOW (ref 4.22–5.81)
RDW: 13.2 % (ref 11.5–15.5)
WBC: 9.2 K/uL (ref 4.0–10.5)
nRBC: 0 % (ref 0.0–0.2)

## 2023-10-31 LAB — GLUCOSE, CAPILLARY: Glucose-Capillary: 170 mg/dL — ABNORMAL HIGH (ref 70–99)

## 2023-10-31 MED ORDER — FUROSEMIDE 10 MG/ML IJ SOLN
40.0000 mg | Freq: Two times a day (BID) | INTRAMUSCULAR | Status: DC
  Administered 2023-10-31 – 2023-11-01 (×2): 40 mg via INTRAVENOUS
  Filled 2023-10-31 (×2): qty 4

## 2023-10-31 NOTE — Plan of Care (Signed)

## 2023-10-31 NOTE — Consult Note (Addendum)
 HEART AND VASC[facility] CENTER   MULTIDISCIPLINARY HEART VALVE TEAM  Cardiology Consultation:   Patient ID: Luke Harvey MRN: [id number]; DOB: 1945/06/17  Admit date: 10/29/2023 Date of Consult: 10/31/2023  Primary Care Provider: Clinic, [facility] Cardiologist: Luke LITTIE Nanas, MD  [facility] Electrophysiologist:  None    Patient Profile:   Luke Harvey is a 78 y.o. male with a hx of CAD s/p CABG x2V (2014), multiple PCI's, CKD stage IIIa, ICM w/ EF of 25 to 30%, HLD, T2DM, and LFLG AS who is being seen today for the evaluation of severe AS at the request of Dr. Debera.  History of Present Illness:   Luke Harvey lives in [place] .  He lives alone and takes care of his own ADLs.  He continues to drive.  [name], [name], who lives in [place] and helps take care of him. He has good dental heath.  Underwent CABG in 2014 with LIMA to LAD and SVG to LCx. In 2015 he underwent stenting of the RCA.  In 2018, he underwent PCI/DES for ISR of RCA in the setting of NSTEMI. He was admitted 03/2020 with unstable angina. S/p DES to SVG--> OM x 3 and staged CSI orbital atherectomy shockwave IVUS lithotripsy to prox-mid RCA and shockwave lithotripsy and POBA to distal RCA. He had accelerating chest pain in 2023. Cath showed patent LIMA to LAD, SVG to OM with 50% ostial and 25% proximal in-stent restenosis. There was 95% stenosis at the proximal marginal of mid graft stent s/p PCI/DES x1 to SVG to OM overlapping previously placed stent. Plan for DAPT with ASA/Brilinta  ideally indefinitely.   He has been followed by the [facility] but was recent admitted to [facility] 6/29-10/26/23 for NSTEMI. Peak troponin 525. Cath showed severe native LCA disease with occcluded LCX and LAD, patent RCA stents, patent LIMA to LAD, SVG-OM had 70% stenosis s/p successful PCI of vein graft with 2 overlapping stents. Echo 10/25/23 showed LVEF 25-30%, LV RWMAs, G2DD, mod-severe MAC  with mild-mod MR/TR, moderate AI and severe LFLG AS: mean gradient 17.7 mmHg, V-max 2.81 m/s, [name] 0.93 cm, DVI 0.29, SVI 26.  The right and left aortic cusps were noted to be virtually immobile questioning fusion, while the posterior noncoronary cusp had near normal mobility. The regional wall motion abnormalities were not felt to correlate to his angiographic findings and felt to possibly indicate aortic stenosis as the cause of his cardiomyopathy. Outpatient TAVR consult was recommended and set up for 7/23.  He presented back to [facility] on 10/29/2023 with worsening exertional shortness of breath.  BNP elevated ~1200, hstrop 258-->255-->298. CXR with pulmonary edema and small to moderate right pleural effusion. He was started on IV Lasix . Net negative 1.5L.  He also reported severe attacks of shortness of breath.  In the emergency department these were noticed to correlate with atrial tachycardia on telemetry.  These episodes occurred at rest.  However, he has shortness of breath with mild levels of exertion at baseline which is worsened recently.  He denies chest pain.  He denies lower extremity edema.  He does have orthopnea as well as PND.  He has dizziness but no syncope.   Past Medical History:  Diagnosis Date   Age-related macular degeneration, dry, both eyes    Anxiety    Aortic stenosis    Arthritis    probably all over (06/08/2016)   Cardiomyopathy (HCC)    a. EF 40-45% in 2018, normalized in  03/2020.   Chronic back pain    started in my lower back; going up my back in the last couple months (06/08/2016)   Chronic sinusitis    Coronary artery disease    a. s/p CABGx 2V (2014 in [place])  b. PCI (2015 in [place]). c. PCI 2018 ([facility]). d. PCI 03/2020 (complex - [facility]).   Diabetic peripheral neuropathy (HCC)    DVT (deep venous thrombosis) (HCC)    left groin; it was there when I had bypass OR; get it checked q yr; still there now (06/08/2016)   GERD (gastroesophageal  reflux disease)    Hepatitis B    History of bleeding ulcers    History of diverticulitis    History of hiatal hernia    HLD (hyperlipidemia)    HTN (hypertension)    LV dysfunction, post MI EF 40-45%. 06/09/2016   NSTEMI (non-ST elevated myocardial infarction) (HCC) 06/08/2016   Pneumonia    3-4 times maybe (06/08/2016)   PVD (peripheral vascular disease) (HCC)    Recovering alcoholic (HCC)    picked up my 30 year chip the other day (06/08/2016)   S/P angioplasty with stent, 06/08/16 DES, for in-stent restenosis in RCA. 06/09/2016   Sleep apnea    got a mask after OR; don't use it (06/08/2016)   Type II diabetes mellitus (HCC)     Past Surgical History:  Procedure Laterality Date   ANTERIOR CERVICAL DECOMP/DISCECTOMY FUSION     BACK SURGERY     CARDIAC CATHETERIZATION  03/2012   led to bypass   CAROTID ENDARTERECTOMY Right ~ 2014   COLONOSCOPY W/ BIOPSIES AND POLYPECTOMY     CORONARY ANGIOPLASTY WITH STENT PLACEMENT  2015; 06/08/2016   CORONARY ARTERY BYPASS GRAFT  04/10/2012   CORONARY ATHERECTOMY N/A 04/04/2020   Procedure: CORONARY ATHERECTOMY;  Surgeon: Burnard Debby LABOR, MD;  Location: [facility] INVASIVE CV LAB;  Service: Cardiovascular;  Laterality: N/A;   CORONARY STENT INTERVENTION N/A 06/08/2016   Procedure: Coronary Stent Intervention;  Surgeon: Debby LABOR Burnard, MD;  Location: [facility] INVASIVE CV LAB;  Service: Cardiovascular;  Laterality: N/A;   CORONARY STENT INTERVENTION N/A 04/02/2020   Procedure: CORONARY STENT INTERVENTION;  Surgeon: Anner Alm ORN, MD;  Location: [facility] INVASIVE CV LAB;  Service: Cardiovascular;  Laterality: N/A;   CORONARY STENT INTERVENTION N/A 04/04/2020   Procedure: CORONARY STENT INTERVENTION;  Surgeon: Burnard Debby LABOR, MD;  Location: [facility] INVASIVE CV LAB;  Service: Cardiovascular;  Laterality: N/A;   CORONARY STENT INTERVENTION N/A 11/16/2021   Procedure: CORONARY STENT INTERVENTION;  Surgeon: Mady Bruckner, MD;  Location: [facility] INVASIVE CV LAB;  Service:  Cardiovascular;  Laterality: N/A;   CORONARY STENT INTERVENTION N/A 10/24/2023   Procedure: CORONARY STENT INTERVENTION;  Surgeon: Anner Alm ORN, MD;  Location: [facility] INVASIVE CV LAB;  Service: Cardiovascular;  Laterality: N/A;   CORONARY ULTRASOUND/IVUS N/A 04/04/2020   Procedure: Intravascular Ultrasound/IVUS;  Surgeon: Burnard Debby LABOR, MD;  Location: [facility] INVASIVE CV LAB;  Service: Cardiovascular;  Laterality: N/A;   JOINT REPLACEMENT     LEFT HEART CATH AND CORONARY ANGIOGRAPHY N/A 06/08/2016   Procedure: Left Heart Cath and Coronary Angiography;  Surgeon: Debby LABOR Burnard, MD;  Location: [facility];  Service: Cardiovascular;  Laterality: N/A;   LEFT HEART CATH AND CORS/GRAFTS ANGIOGRAPHY N/A 04/02/2020   Procedure: LEFT HEART CATH AND CORS/GRAFTS ANGIOGRAPHY;  Surgeon: Anner Alm ORN, MD;  Location: [facility] INVASIVE CV LAB;  Service: Cardiovascular;  Laterality: N/A;   LEFT HEART CATH AND CORS/GRAFTS  ANGIOGRAPHY N/A 11/16/2021   Procedure: LEFT HEART CATH AND CORS/GRAFTS ANGIOGRAPHY;  Surgeon: Mady Bruckner, MD;  Location: [facility] INVASIVE CV LAB;  Service: Cardiovascular;  Laterality: N/A;   REVISION TOTAL HIP ARTHROPLASTY Left ~ 2004   RIGHT/LEFT HEART CATH AND CORONARY ANGIOGRAPHY N/A 10/24/2023   Procedure: RIGHT/LEFT HEART CATH AND CORONARY ANGIOGRAPHY;  Surgeon: Anner Alm ORN, MD;  Location: [facility] INVASIVE CV LAB;  Service: Cardiovascular;  Laterality: N/A;   TEMPORARY PACEMAKER N/A 04/04/2020   Procedure: TEMPORARY PACEMAKER;  Surgeon: Burnard Debby LABOR, MD;  Location: [facility] INVASIVE CV LAB;  Service: Cardiovascular;  Laterality: N/A;   TONSILLECTOMY     TOTAL HIP ARTHROPLASTY Left 1990s     Home Medications:  Prior to Admission medications   Medication Sig Start Date End Date Taking? Authorizing Provider  acetaminophen  (TYLENOL ) 500 MG tablet Take 500 mg by mouth 2 (two) times daily.   Yes [provider]  APPLE CIDER VINEGAR PO Take 1 capsule by mouth daily at 12 noon.   Yes  [provider]  aspirin  EC 81 MG tablet Take 81 mg by mouth daily at 12 noon.   Yes [provider]  BIOTIN PO Take 1 tablet by mouth daily at 12 noon.   Yes [provider]  carboxymethylcellulose (REFRESH PLUS) 0.5 % SOLN Place 1 drop into both eyes See admin instructions. Administer 1 drop into each eye every daily. May use every 12 hours if needed for dry eyes.   Yes [provider]  Cholecalciferol (VITAMIN D-3 PO) Take 1 tablet by mouth daily at 12 noon.   Yes [provider]  Coenzyme Q10 (COQ10 PO) Take 1 capsule by mouth daily at 12 noon.   Yes [provider]  Cyanocobalamin (VITAMIN B12 PO) Take 1 tablet by mouth daily at 12 noon.   Yes [provider]  diclofenac Sodium (VOLTAREN) 1 % GEL 1 Application every 6 (six) hours as needed for pain. 02/02/22  Yes [provider]  DULoxetine  (CYMBALTA ) 30 MG capsule Take 30 mg by mouth at bedtime.   Yes [provider]  empagliflozin  (JARDIANCE ) 10 MG TABS tablet Take 1 tablet (10 mg total) by mouth daily. 10/27/23  Yes Amin, Ankit C, MD  fluticasone  (FLONASE ) 50 MCG/ACT nasal spray Place 1-2 sprays into both nostrils daily as needed for allergies or rhinitis.   Yes [provider]  isosorbide  mononitrate (IMDUR ) 30 MG 24 hr tablet Take 60 mg by mouth daily at 12 noon.   Yes [provider]  ketoconazole (NIZORAL) 2 % cream Apply 1 Application topically 2 (two) times daily as needed for irritation. 02/23/21  Yes [provider]  ketoconazole (NIZORAL) 2 % shampoo Apply 1 Application topically every 3 (three) days.   Yes [provider]  levOCARNitine (L-CARNITINE PO) Take 2 tablets by mouth daily at 12 noon.   Yes [provider]  levothyroxine  (SYNTHROID ) 75 MCG tablet Take 75 mcg by mouth daily before breakfast. 02/02/22  Yes [provider]  lidocaine  (LIDODERM ) 5 % Place 1 patch onto the skin daily as needed  (pain).   Yes [provider]  loratadine  (CLARITIN ) 10 MG tablet Take 10 mg by mouth daily at 12 noon.   Yes [provider]  MAGNESIUM  PO Take 2 tablets by mouth daily at 12 noon.   Yes [provider]  metFORMIN  (GLUCOPHAGE ) 500 MG tablet Take 1,000 mg by mouth 2 (two) times daily as needed (BS > 140). 05/04/06  Yes [provider]  metoprolol  succinate (TOPROL -XL) 50 MG 24 hr tablet Take 1 tablet (50 mg total) by mouth daily. Take with or immediately following a meal. 10/27/23  Yes Amin, Ankit C, MD  MILK THISTLE PO Take 1 tablet by mouth daily at 12 noon.   Yes [provider]  Misc Natural Products (TURMERIC, CURCUMIN, PO) Take 1 tablet by mouth daily at 12 noon.   Yes [provider]  Multiple Vitamins-Minerals (PRESERVISION AREDS 2) CAPS Take 1 capsule by mouth 2 (two) times daily.   Yes [provider]  nitroGLYCERIN  (NITROSTAT ) 0.4 MG SL tablet Place 1 tablet (0.4 mg total) under the tongue See admin instructions. Take 1 tablet (0.4) mg by mouth every day. May take an additional 1 tablet later in the day if  chest pain. 10/26/23  Yes Amin, Ankit C, MD  OVER THE COUNTER MEDICATION Take 1 capsule by mouth daily at 12 noon. VisiUltra   Yes [provider]  OVER THE COUNTER MEDICATION Take 1 capsule by mouth daily at 12 noon. blood boost supplement for glucose control   Yes [provider]  Probiotic Product (PROBIOTIC PO) Take 2 capsules by mouth daily at 12 noon.   Yes [provider]  QUERCETIN PO Take 1 tablet by mouth daily at 12 noon.   Yes [provider]  rosuvastatin  (CRESTOR ) 40 MG tablet Take 1 tablet (40 mg total) by mouth daily. Patient taking differently: Take 40 mg by mouth at bedtime. 04/05/20  Yes Dunn, Dayna N, Luke Harvey  Saw Palmetto, Serenoa repens, (SAW PALMETTO PO) Take 1 tablet by mouth daily at 12 noon.   Yes [provider]  SELENIUM PO Take 1 tablet by mouth daily at 12  noon.   Yes [provider]  spironolactone  (ALDACTONE ) 25 MG tablet Take 0.5 tablets (12.5 mg total) by mouth daily. 10/26/23  Yes Amin, Ankit C, MD  ticagrelor  (BRILINTA ) 90 MG TABS tablet Take 1 tablet (90 mg total) by mouth 2 (two) times daily. 04/05/20  Yes Dunn, Dayna N, Luke Harvey    Inpatient Medications: Scheduled Meds:  aspirin  EC  81 mg Oral Q1200   DULoxetine   30 mg Oral QHS   furosemide   40 mg Intravenous BID   heparin   5,000 Units Subcutaneous Q8H   isosorbide  mononitrate  30 mg Oral Daily   levothyroxine   75 mcg Oral Q0600   loratadine   10 mg Oral Q1200   metoprolol  succinate  25 mg Oral Daily   rosuvastatin   40 mg Oral Daily   sodium chloride  flush  3 mL Intravenous Q12H   spironolactone   12.5 mg Oral Daily   ticagrelor   90 mg Oral BID   Continuous Infusions:  PRN Meds: acetaminophen , fluticasone , ondansetron  (ZOFRAN ) IV, sodium chloride  flush  Allergies:    Allergies  Allergen Reactions   Tetracyclines & Related Hives and Itching   Cozaar [Losartan] Cough   Zestril  [Lisinopril ] Cough   Lipitor [Atorvastatin] Other (See Comments)    Myalgias     Social History:   Social History   Socioeconomic History   Marital status: Divorced    Spouse name: Not on file   Number of children: 2   Years of education: Not on file   Highest education level: GED or equivalent  Occupational History   Occupation: Retired  Tobacco Use   Smoking status: Former    Current packs/day: 0.00    Average packs/day: 1 pack/day for 40.0 years (40.0 ttl pk-yrs)  Types: Cigarettes    Start date: 07/17/1968    Quit date: 07/17/2008    Years since quitting: 15.2   Smokeless tobacco: Never  Substance and Sexual Activity   Alcohol  use: No    Comment: recovering alcoholic, abstinence 30 years.  attends AA weekly   Drug use: No   Sexual activity: Never  Other Topics Concern   Not on file  Social History Narrative   Not on file   Social Drivers of Health   Financial Resource  Strain: Not on file  Food Insecurity: No Food Insecurity (10/29/2023)   Hunger Vital Sign    Worried About Running Out of Food in the Last Year: Never true    Ran Out of Food in the Last Year: Never true  Transportation Needs: No Transportation Needs (10/29/2023)   PRAPARE - Administrator, Civil Service (Medical): No    Lack of Transportation (Non-Medical): No  Physical Activity: Not on file  Stress: Not on file  Social Connections: Unknown (10/30/2023)   Social Connection and Isolation Panel    Frequency of Communication with Friends and Family: Never    Frequency of Social Gatherings with Friends and Family: Never    Attends Religious Services: Never    Database administrator or Organizations: Patient declined    Attends Banker Meetings: Patient declined    Marital Status: Not on file  Intimate Partner Violence: Not At Risk (10/29/2023)   Humiliation, Afraid, Rape, and Kick questionnaire    Fear of Current or Ex-Partner: No    Emotionally Abused: No    Physically Abused: No    Sexually Abused: No    Family History:   Family History  Problem Relation Age of Onset   Cancer Mother      ROS:  Please see the history of present illness.  All other ROS reviewed and negative.     Physical Exam/Data:   Vitals:   10/31/23 0323 10/31/23 0717 10/31/23 1030 10/31/23 1149  BP: 120/74 115/61 115/61 108/80  Pulse: 93 85 (!) 104 91  Resp: (!) 28 17  19   Temp: 97.6 F (36.4 C) 97.8 F (36.6 C)  97.8 F (36.6 C)  TempSrc: Oral Oral  Oral  SpO2: 96% 99%  97%  Weight: 63 kg     Height:        Intake/Output Summary (Last 24 hours) at 10/31/2023 1407 Last data filed at 10/31/2023 1248 Gross per 24 hour  Intake --  Output 900 ml  Net -900 ml      10/31/2023    3:23 AM 10/29/2023   11:15 PM 10/29/2023    5:06 PM  Last 3 Weights  Weight (lbs) 138 lb 12.8 oz 139 lb 11.2 oz 139 lb 8.8 oz  Weight (kg) 62.959 kg 63.368 kg 63.3 kg     Body mass index is 22.07 kg/m.   General:  Thin, frail appearing white male  HEENT: normal Neck: no JVD Endocrine:  No thryomegaly Vascular: No carotid bruits; FA pulses 2+ bilaterally without bruits  Cardiac:  normal S1, S2; RRR; barely audible systolic murmur Lungs:  clear to auscultation bilaterally, no wheezing, rhonchi or rales  Abd: soft, nontender, no hepatomegaly  Ext: no edema Musculoskeletal:  No deformities, BUE and BLE strength normal and equal Skin: warm and dry  Neuro:  CNs 2-12 intact, no focal abnormalities noted Psych:  Normal affect   EKG:  The EKG was personally reviewed and demonstrates:  sinus with  IVCD, HR 89 Telemetry:  Telemetry was personally reviewed and demonstrates:  sinus with runs of atrial tachycardia   Cardiac Studies & Procedures   ______________________________________________________________________________________________ CARDIAC CATHETERIZATION  CARDIAC CATHETERIZATION 10/24/2023  Conclusion Images from the original result were not included.    Mid LM to Prox LAD lesion is 95% stenosed with 90% stenosed side branch in Ost Cx to Prox Cx.   Prox LAD to Mid LAD lesion is 100% stenosed with 99% stenosed side branch in 2nd Diag.   Previously placed mid RCA to Dist RCA DES stent is 30% stenosed.  Previously placed Prox RCA DES stent of is widely patent.   LIMA-LAD and is very large, and existing no disease.  Retrograde flow to the LAD fills a diagonal branch and antegrade flow reaches the apex.  No competitive flow.   2nd Mrg lesion is 100% stenosed.   Prox Graft to Mid Graft lesion is 90% stenosed.  (Lesion #3)   A drug-eluting stent was successfully placed, overlapping with previous distal stent and covering the lesion, using a STENT SYNERGY XD 3.0X32.  Deployed to 3.3 mm with stent balloon.   Post intervention, there is a 0% residual stenosis.   Mid Graft lesion is 80% stenosed (lesion #4.)-Focal in-stent restenosis but otherwise the stent is widely patent.  Scoring balloon  angioplasty was performed using a BALLOON SCOREFLEX 3.0X10.  Post intervention, there is a 10% residual stenosis.   Dist Graft lesion is 45% stenosed.   Prox Graft-2 lesion is 60% stenosed.  Prox Graft-1 lesion is 25% stenosed.   A drug-eluting stent was successfully placed overlapping the previous ostial and proximal stent and the newly placed stent, using a STENT SYNERGY XD 3.0X24.  Deployed to 3.3 mm. Post intervention, there is a 0% residual stenosis.   Postintervention, there is TIMI-3 flow maintained throughout the SVG-OM 2 graft   ---------------------------------------   Hemodynamic findings consistent with mild pulmonary hypertension.   There is moderate aortic valve stenosis.  Mean gradient estimated 24.3 mmHg.  Trivial from estimated based on right heart cath numbers  Diagnostic  Dominance: Right      Intervention  POST-CATH DIAGNOSES Severe Native Left Coronary Artery Disease with essentially occluded LCx and LAD, very small caliber ramus and OM1 remain within 90% distal LM stenosis. Patent RCA stents with mild stenosis between the stents but otherwise stable flow. Patent LIMA to LAD SVG-OM has ostial 70% stenosis, mid graft there is tandem 60 and 90% stenosis as well as 80% ISR in the proximal edge of the more distal stent. - Successful score flex angioplasty of the ISR and PTCA-DES PCI of the vein graft connecting the 2 previous stent using 2 overlapping Synergy Stents (3.0 mm x 32 Owen 3.0 mm x 24 mm with all stents deployed to roughly 3.3 mm using stent balloon for overlap post dilation all the way to the ostium.  Right Heart Cath: RAP mean 12 mmHg, RVEDP-EDP 58/10-15 mmHg; PAP-mean 43/19-29 mmHg, PCWP 17 mmHg; LV P-EDP 162/1-23 mmHg, AO P-MAP 126/50-81 mmHg. AoV: PSV gradient 38 mm 3 with a mean of 24 mmHg. (264 ms)-moderate stenosis. Ao sat 98%, PA sat 61%. Fick Cardiac Output-Index 3.76-2.17-moderately reduced.   RECOMMENDATIONS   Anticipated discharge date to be  determined.   Patient will need to have echocardiogram completed and reviewed to reassess aortic valve. Titrate GDMT based on echo results and CAD.   Recommend uninterrupted dual antiplatelet therapy with Aspirin  81mg  daily and Ticagrelor  90mg  twice daily for a minimum of  12 months (ACS-Class I recommendation).    Alm MICAEL Clay, MD, MS Alm Clay, M.D., M.S. Interventional Cardiologist [facility] Pager # [phone]  Findings Coronary Findings Diagnostic  Dominance: Right  Left Main Mid LM to Prox LAD lesion is 95% stenosed with 90% stenosed side branch in Ost Cx to Prox Cx.  Left Anterior Descending Prox LAD to Mid LAD lesion is 100% stenosed with 99% stenosed side branch in 2nd Diag.  Ramus Intermedius Vessel is small.  Left Circumflex  First Obtuse Marginal Branch Vessel is small in size.  Second Obtuse Marginal Branch 2nd Mrg lesion is 100% stenosed.  Right Coronary Artery Previously placed Prox RCA stent of unknown type is  widely patent. The lesion is located at the bend and eccentric. The lesion is calcified. Previously placed stent displays no restenosis. Mid RCA to Dist RCA lesion is 30% stenosed. The lesion is located proximal to the major branch. The lesion was previously treated using a drug eluting stent over 2 years ago. Previously placed stent displays restenosis.  Acute Marginal Branch Vessel is small in size.  Right Ventricular Branch Vessel is small in size.  First Right Posterolateral Branch Vessel is small in size.  Second Right Posterolateral Branch Vessel is moderate in size.  LIMA LIMA Graft To Mid LAD LIMA and is very large.  The graft exhibits no disease.  Saphenous Graft To 2nd Mrg SVG graft was visualized by angiography and is large.  The graft exhibits severe focal disease. Origin to Prox Graft lesion is 70% stenosed. The lesion is concentric. The lesion was previously treated using a drug eluting stent over 2 years  ago. Prox Graft-1 lesion is 25% stenosed. The lesion is eccentric. The lesion was previously treated . This did not appear to be as significant on initial imaging.->  Following ostial stent placement, but this appeared to be worse, therefore a second overlapping stent was placed to cover this lesion. Prox Graft-2 lesion is 60% stenosed. Vessel is not the culprit lesion. The lesion is segmental, eccentric and irregular. Prox Graft to Mid Graft lesion is 90% stenosed. Mid Graft lesion is 80% stenosed. Vessel is the culprit lesion. The lesion was previously treated using a drug eluting stent over 2 years ago. Focal ISR Previously placed stent displays restenosis. Dist Graft lesion is 45% stenosed. The lesion was previously treated .  Intervention  Origin to Prox Graft lesion (Saphenous Graft To 2nd Mrg) Angioplasty Scoring balloon angioplasty was performed using a BALLOON SCOREFLEX 3.0X10. Maximum pressure: 16 atm. Inflation time: 20 sec.  A second ballloon was used, using a standard  BALLOON SAPPHIRE 2.5X12. Maximum pressure:  14 atm. Inflation time:  20 sec. Post-Intervention Lesion Assessment The intervention was successful. Pre-interventional TIMI flow is 3. Post-intervention TIMI flow is 3. Treated lesion length:  16 mm. No complications occurred at this lesion. There is a 10% residual stenosis post intervention.  Prox Graft-1 lesion (Saphenous Graft To 2nd Mrg) Stent (Also treats lesions: Prox Graft-2) Lesion length:  20 mm. CATH LAUNCHER 6FR AL1 guide catheter was inserted. Lesion crossed with guidewire using a WIRE ASAHI PROWATER 180CM. Pre-stent angioplasty was performed using a BALLOON EMERGE MR 2.5X30. Maximum pressure:  12 atm. Inflation time: 20 sec. A drug-eluting stent was successfully placed using a STENT SYNERGY XD 3.0X24. Maximum pressure: 18 atm. Inflation time: 30 sec. Stent strut is well apposed. Deployed to 3.3 mm. Stent overlaps previously placed stent. Post-stent angioplasty  was performed. Maximum pressure:  18 atm. Inflation time:  20 sec. Stent balloon used to post dilate all 4 overlapping stents. Total of 6 inflations with stent balloon for postdilation Post-Intervention Lesion Assessment The intervention was successful. Pre-interventional TIMI flow is 3. Post-intervention TIMI flow is 3. There is a 0% residual stenosis post intervention.  Prox Graft-2 lesion (Saphenous Graft To 2nd Mrg) Stent Lesion length:  20 mm. CATH LAUNCHER 6FR AL1 guide catheter was inserted. Lesion crossed with guidewire using a WIRE ASAHI PROWATER 180CM. Pre-stent angioplasty was performed using a BALLOON EMERGE MR 2.5X30. Maximum pressure:  12 atm. Inflation time: 20 sec. A drug-eluting stent was successfully placed using a STENT SYNERGY XD 3.0X24. Maximum pressure: 18 atm. Inflation time: 30 sec. Stent strut is well apposed. Stent overlaps previously placed stent. Post-stent angioplasty was performed. Maximum pressure:  18 atm. Inflation time:  20 sec. The same stent balloon was used to post dilate up and down the 2 new stents as well as the proximal and distal stents all the way to the ostium Stent (Also treats lesions: Prox Graft-1) See details in Prox Graft-1 lesion (Saphenous Graft To 2nd Mrg). Stent (Also treats lesions: Prox Graft to Mid Graft, and Mid Graft) Lesion length:  32 mm. CATH LAUNCHER 6FR AL1 guide catheter was inserted. Lesion crossed with guidewire using a WIRE ASAHI PROWATER 180CM. Pre-stent angioplasty was performed using a BALLOON SAPPHIRE 2.5X12. Maximum pressure:  12 atm. Inflation time: 20 sec.  A second angioplasty balloon was used, using a BALLOON EMERGE MR 2.5X30. Maximum pressure:  12 atm. Inflation time:  20 sec. A drug-eluting stent was successfully placed using a STENT SYNERGY XD 3.0X32. Maximum pressure: 16 atm. Inflation time: 30 sec. Stent strut is well apposed. Deployed 3.3 mm Stent overlaps previously placed stent. Post-stent angioplasty was performed.  Maximum pressure:  18 atm. Inflation time:  20 sec. Stent balloon-high ATM Post-Intervention Lesion Assessment The intervention was successful. Pre-interventional TIMI flow is 3. Post-intervention TIMI flow is 3. Treated lesion length:  20 mm. No complications occurred at this lesion. There is a 0% residual stenosis post intervention.  Prox Graft to Mid Graft lesion (Saphenous Graft To 2nd Mrg) Stent (Also treats lesions: Prox Graft-2, and Mid Graft) See details in Prox Graft-2 lesion (Saphenous Graft To 2nd Mrg). Post-Intervention Lesion Assessment The intervention was successful. Pre-interventional TIMI flow is 3. Post-intervention TIMI flow is 3. Treated lesion length:  32 mm. No complications occurred at this lesion. There is a 0% residual stenosis post intervention.  Mid Graft lesion (Saphenous Graft To 2nd Mrg) Angioplasty Scoring balloon angioplasty was performed using a BALLOON SCOREFLEX 3.0X10. Maximum pressure: 16 atm. Inflation time: 20 sec.  A second ballloon was used, using a standard  BALLOON SAPPHIRE 2.5X12. Maximum pressure:  12 atm. Inflation time:  20 sec. Stent (Also treats lesions: Prox Graft-2, and Prox Graft to Mid Graft) See details in Prox Graft-2 lesion (Saphenous Graft To 2nd Mrg). Post-Intervention Lesion Assessment The intervention was successful. Pre-interventional TIMI flow is 3. Post-intervention TIMI flow is 3. Treated lesion length:  15 mm. There is a 10% residual stenosis post intervention.   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 Patent RCA stents with 30% in-stent restenosis in the mid/distal stent.  Proximal stent is widely patent. Widely patent LIMA-LAD. Patent SVG-OM with 50% ostial and 25% proximal in-stent restenosis.  There is a 95% stenosis at the proximal  margin of  the mid graft stent as well as a 40-50% lesion at the distal stent edge. Mildly elevated left ventricular filling pressure (LVEDP 18 mmHg) with normal systolic function (LVEF 55-65%). Probably moderate aortic valve stenosis (peak-to-peak gradient 20 mmHg). Successful PCI to 95% stenosis in mid portion of SVG-OM using Synergy 3.0 x 16 mm drug-eluting stent that overlaps the previously placed stent.  There is 0% residual stenosis with TIMI-3 flow following PCI.  Recommendations: Continue indefinite dual antiplatelet therapy with aspirin  and ticagrelor .  One could consider stopping aspirin  as soon as 3 months post-PCI if there is concern for high bleeding risk. Medical therapy of mild-moderate in-stent restenosis involving SVG-OM and mid/distal RCA. Aggressive secondary prevention of coronary artery disease. Anticipate same-day discharge if no post-PCI complications occur during 6 hour monitoring period.  Luke Hanson, MD [facility]  Findings Coronary Findings Diagnostic  Dominance: Right  Left Main Mid LM to Prox LAD lesion is 95% stenosed with 90% stenosed side branch in Ost Cx to Prox Cx.  Left Anterior Descending Prox LAD to Mid LAD lesion is 100% stenosed with 99% stenosed side branch in 2nd Diag.  Ramus Intermedius Vessel is small.  Left Circumflex  First Obtuse Marginal Branch Vessel is small in size.  Second Obtuse Marginal Branch 2nd Mrg lesion is 90% stenosed.  Right Coronary Artery Previously placed Prox RCA stent of unknown type is  widely patent. The lesion is located at the bend and eccentric. The lesion is calcified. Mid RCA to Dist RCA lesion is 30% stenosed. The lesion was previously treated using a bare metal stent and a drug eluting stent over 2 years ago.  Acute Marginal Branch Vessel is small in size.  Right Ventricular Branch Vessel is small in size.  First Right Posterolateral Branch Vessel is small in size.  Second Right Posterolateral  Branch Vessel is moderate in size.  LIMA LIMA Graft To Mid LAD LIMA and is very large.  The graft exhibits no disease.  Saphenous Graft To 2nd Mrg SVG and is large. Origin to Prox Graft lesion is 50% stenosed. The lesion is concentric. The lesion was previously treated . Prox Graft lesion is 25% stenosed. The lesion is eccentric. The lesion was previously treated . This did not appear to be as significant on initial imaging.->  Following ostial stent placement, but this appeared to be worse, therefore a second overlapping stent was placed to cover this lesion. Mid Graft lesion is 95% stenosed. Vessel is the culprit lesion. The lesion was previously treated . Previously placed stent displays restenosis. Dist Graft lesion is 45% stenosed.  Intervention  Mid Graft lesion (Saphenous Graft To 2nd Mrg) Stent Lesion length:  15 mm. CATHETER LAUNCHER 6FR AL1 guide catheter was inserted. Lesion crossed with guidewire using a WIRE RUNTHROUGH .[id number]. Pre-stent angioplasty was performed using a BALLN SAPPHIRE 2.5X12. Maximum pressure:  6 atm. A drug-eluting stent was successfully placed using a SYNERGY XD 3.0X16. Maximum pressure: 16 atm. Stent overlaps previously placed stent. Post-stent angioplasty was not performed. Post-Intervention Lesion Assessment The intervention was successful. Embolic protection device was not deployed. Pre-interventional TIMI flow is 2. Post-intervention TIMI flow is 3. No complications occurred at this lesion. There is a 0% residual stenosis post intervention.     ECHOCARDIOGRAM  ECHOCARDIOGRAM COMPLETE 10/25/2023  Narrative ECHOCARDIOGRAM REPORT    Patient Name:   Luke Harvey [facility] Date of Exam: 10/25/2023 Medical Rec #:  [id number]        Height:  66.0 in Accession #:    [id number]       Weight:       139.6 lb Date of Birth:  19-May-1945        BSA:          1.716 m Patient Age:    77 years         BP:           125/90 mmHg Patient Gender: M                 HR:           108 bpm. Exam Location:  Inpatient  Procedure: 2D Echo, Cardiac Doppler, Color Doppler and Intracardiac Opacification Agent (Both Spectral and Color Flow Doppler were utilized during procedure).  Indications:    Acute ischemic heart disease, unspecified I24.9 Aortic stenosis I35.0  History:        Patient has prior history of Echocardiogram examinations, most recent 02/16/2022. Previous Myocardial Infarction and Angina, Prior CABG, Aortic Valve Disease; Risk Factors:Dyslipidemia.  Sonographer:    Thea Norlander RCS Sonographer#2:  Juliene Rucks Referring Phys: [phone] DAVID W HARDING  IMPRESSIONS   1. There is no left ventricular thrombus (Definity  contrast used). Findings suggest infarction/scar versus severe resting ischemia in the mid-distal LAD artery distribution and ischemia/partial scar in the proximal LAD and in the entire right coronary artery distribution. Left ventricular ejection fraction, by estimation, is 25 to 30%. The left ventricle has moderate to severely decreased function. The left ventricle demonstrates regional wall motion abnormalities (see scoring diagram/findings for description). Left ventricular diastolic parameters are consistent with Grade II diastolic dysfunction (pseudonormalization). Elevated left atrial pressure. There is akinesis of the left ventricular, mid-apical anterolateral wall, anterior wall and anteroseptal wall. There is severe hypokinesis of the left ventricular, basal anteroseptal wall and anterior wall. There is moderate hypokinesis of the left ventricular, basal-mid inferior wall and inferoseptal wall. 2. Right ventricular systolic function is normal. The right ventricular size is normal. There is normal pulmonary artery systolic pressure. The estimated right ventricular systolic pressure is 32.2 mmHg. 3. Left atrial size was moderately dilated. 4. The mitral valve is degenerative. Mild to moderate mitral valve regurgitation. No  evidence of mitral stenosis. Moderate to severe mitral annular calcification. 5. Tricuspid valve regurgitation is mild to moderate. 6. The right and left aortic cusps are virtually immobile (fused?), while the posterior noncoronary cusp has almost normal mobility. Hemodynamics are consistet with moderate to severe low flow-low gradient aortic stenosis. The aortic valve has an indeterminant number of cusps. There is moderate calcification of the aortic valve. There is moderate thickening of the aortic valve. Aortic valve regurgitation is moderate. Moderate to severe aortic valve stenosis. Aortic valve mean gradient measures 17.7 mmHg. Aortic valve Vmax measures 2.81 m/s. Aortic valve acceleration time measures 95 msec. 7. The inferior vena cava is normal in size with greater than 50% respiratory variability, suggesting right atrial pressure of 3 mmHg.  Comparison(s): A prior study was performed on 2023 [facility]. Prior images unable to be directly viewed, comparison made by report only. The left ventricular function is significantly worse. The left ventricular wall motion abnormalities are significantly worse. Aortic stenosis is worse and was underestimated on previous studies due to low stroke volume index.  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  FINDINGS Left Ventricle: There is no  left ventricular thrombus (Definity  contrast used). Findings suggest infarction/scar versus severe resting ischemia in the mid-distal LAD artery distribution and ischemia/partial scar in the proximal LAD and in the entire right coronary artery distribution. Left ventricular ejection fraction, by estimation, is 25 to 30%. The left ventricle has moderate to severely decreased function. The left ventricle demonstrates  regional wall motion abnormalities. Severe hypokinesis of the left ventricular, basal anteroseptal wall and anterior wall. Moderate hypokinesis of the left ventricular, basal-mid inferior wall and inferoseptal wall. Definity  contrast agent was given IV to delineate the left ventricular endocardial borders. The left ventricular internal cavity size was normal in size. There is no left ventricular hypertrophy. Left ventricular diastolic parameters are consistent with Grade II diastolic dysfunction (pseudonormalization). Elevated left atrial pressure.   LV Wall Scoring: The mid anteroseptal segment, apical lateral segment, mid anterolateral segment, apical anterior segment, apical inferior segment, and apex are akinetic. The anterior wall, inferior septum, inferior wall, and basal anteroseptal segment are hypokinetic. The posterior wall and basal anterolateral segment are normal.  Right Ventricle: The right ventricular size is normal. No increase in right ventricular wall thickness. Right ventricular systolic function is normal. There is normal pulmonary artery systolic pressure. The tricuspid regurgitant velocity is 2.70 m/s, and with an assumed right atrial pressure of 3 mmHg, the estimated right ventricular systolic pressure is 32.2 mmHg.  Left Atrium: Left atrial size was moderately dilated.  Right Atrium: Right atrial size was normal in size.  Pericardium: There is no evidence of pericardial effusion.  Mitral Valve: The mitral valve is degenerative in appearance. Moderate to severe mitral annular calcification. Mild to moderate mitral valve regurgitation, with centrally-directed jet. No evidence of mitral valve stenosis.  Tricuspid Valve: The tricuspid valve is normal in structure. Tricuspid valve regurgitation is mild to moderate.  Aortic Valve: The right and left aortic cusps are virtually immobile (fused?), while the posterior noncoronary cusp has almost normal mobility. Hemodynamics  are consistet with moderate to severe low flow-low gradient aortic stenosis. The aortic valve has an indeterminant number of cusps. There is moderate calcification of the aortic valve. There is moderate thickening of the aortic valve. Aortic valve regurgitation is moderate. Moderate to severe aortic stenosis is present. Aortic valve mean gradient measures 17.7 mmHg. Aortic valve peak gradient measures 31.7 mmHg. Aortic valve area, by VTI measures 0.91 cm.  Pulmonic Valve: The pulmonic valve was grossly normal. Pulmonic valve regurgitation is trivial. No evidence of pulmonic stenosis.  Aorta: The aortic root and ascending aorta are structurally normal, with no evidence of dilitation.  Venous: The inferior vena cava is normal in size with greater than 50% respiratory variability, suggesting right atrial pressure of 3 mmHg.  IAS/Shunts: No atrial level shunt detected by color flow Doppler.   LEFT VENTRICLE PLAX 2D LVIDd:         4.20 cm   Diastology LVIDs:         3.60 cm   LV e' medial:    5.44 cm/s LV PW:         1.10 cm   LV E/e' medial:  23.7 LV IVS:        1.00 cm   LV e' lateral:   7.94 cm/s LVOT diam:     2.00 cm   LV E/e' lateral: 16.2 LV SV:         44 LV SV Index:   26 LVOT Area:     3.14 cm   RIGHT VENTRICLE  IVC RV S prime:     12.90 cm/s  IVC diam: 1.60 cm TAPSE (M-mode): 1.8 cm  LEFT ATRIUM             Index        RIGHT ATRIUM           Index LA diam:        3.60 cm 2.10 cm/m   RA Area:     13.20 cm LA Vol (A2C):   63.5 ml 37.00 ml/m  RA Volume:   34.40 ml  20.04 ml/m LA Vol (A4C):   57.1 ml 33.27 ml/m LA Biplane Vol: 63.1 ml 36.76 ml/m AORTIC VALVE AV Area (Vmax):    0.93 cm AV Area (Vmean):   0.98 cm AV Area (VTI):     0.91 cm AV Vmax:           281.33 cm/s AV Vmean:          194.000 cm/s AV VTI:            0.486 m AV Peak Grad:      31.7 mmHg AV Mean Grad:      17.7 mmHg LVOT Vmax:         83.60 cm/s LVOT Vmean:        60.300 cm/s LVOT  VTI:          0.141 m LVOT/AV VTI ratio: 0.29  AORTA Ao Root diam: 3.00 cm Ao Asc diam:  3.40 cm  MITRAL VALVE                TRICUSPID VALVE MV Area (PHT): 5.42 cm     TR Peak grad:   29.2 mmHg MV Decel Time: 140 msec     TR Vmax:        270.00 cm/s MV E velocity: 129.00 cm/s MV A velocity: 91.40 cm/s   SHUNTS MV E/A ratio:  1.41         Systemic VTI:  0.14 m Systemic Diam: 2.00 cm  Mihai Croitoru MD Electronically signed by Jerel Balding MD Signature Date/Time: 10/25/2023/10:03:35 AM    Final          ______________________________________________________________________________________________      Laboratory Data:  High Sensitivity Troponin:   Recent Labs  Lab 10/23/23 1620 10/23/23 1753 10/29/23 1545 10/29/23 1823 10/30/23 0041  TROPONINIHS 525* 520* 258* 255* 298*     Chemistry Recent Labs  Lab 10/29/23 1545 10/30/23 0350 10/31/23 0349  NA 133* 134* 136  K 4.6 4.4 4.1  CL 98 97* 100  CO2 21* 24 23  GLUCOSE 165* 224* 158*  BUN 28* 29* 26*  CREATININE 1.28* 1.46* 1.30*  CALCIUM  9.2 9.2 9.1  GFRNONAA 57* 49* 56*  ANIONGAP 14 13 13     No results for input(s): PROT, ALBUMIN, AST, ALT, ALKPHOS, BILITOT in the last 168 hours. Hematology Recent Labs  Lab 10/29/23 1545 10/30/23 0350 10/31/23 0349  WBC 8.8 7.9 9.2  RBC 4.03* 4.31 4.20*  HGB 11.5* 12.1* 11.8*  HCT 35.2* 36.9* 36.8*  MCV 87.3 85.6 87.6  MCH 28.5 28.1 28.1  MCHC 32.7 32.8 32.1  RDW 12.9 12.9 13.2  PLT 405* 401* 398   BNP Recent Labs  Lab 10/29/23 1930  BNP 1,155.5*    DDimer No results for input(s): DDIMER in the last 168 hours.   Radiology/Studies:  DG Chest Portable 1 View Result Date: 10/29/2023 CLINICAL DATA:  Shortness of breath EXAM: PORTABLE CHEST 1 VIEW COMPARISON:  10/23/2023 FINDINGS:  Prior CABG. Heart borderline in size. Small to moderate right pleural effusion. Diffuse interstitial and alveolar opacities have increased concerning for edema. No  acute bony abnormality. IMPRESSION: Diffuse interstitial and alveolar opacities, worsening since prior study concerning for edema. Small to moderate right pleural effusion. Electronically Signed   By: Franky Crease M.D.   On: 10/29/2023 17:35     STS Risk Calculator: Procedure Type: Isolated AVR  Perioperative Outcome Estimate %  Operative Mortality 7.68%  Morbidity & Mortality 32.7%  Stroke 2.78%  Renal Failure 4.13%  Reoperation 8.45%  Prolonged Ventilation 22%  Deep Sternal Wound Infection 0.159%  Long Hospital Stay (>14 days) 26.2%  [facility] Stay (<6 days)* 15.9%   _____________________  [place] Cardiomyopathy Questionnaire     10/31/2023    1:50 PM  K[facility]Q-12  1 a. Ability to shower/bathe Slightly limited  1 b. Ability to walk 1 block Extremely limited  1 c. Ability to hurry/jog Other, Did not do  2. Edema feet/ankles/legs Never over the past 2 weeks  3. Limited by fatigue All of the time  4. Limited by dyspnea All of the time  5. Sitting up / on 3+ pillows Every night  6. Limited enjoyment of life Extremely limited  7. Rest of life w/ symptoms Not at all satisfied  8 a. Participation in hobbies Severely limited  8 b. Participation in chores Severely limited  8 c. Visiting family/friends Severely limited     ______________________________  Pre Surgical Assessment: 5 M Walk Test  44M=16.107ft  5 Meter Walk Test- trial 1: 8 seconds 5 Meter Walk Test- trial 2: 7.9 seconds 5 Meter Walk Test- trial 3: 8 seconds 5 Meter Walk Test Average: 8 seconds    Assessment and Plan:   Luke Harvey is a 78 y.o. 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  Cath 10/24/23 (done last week in the setting of NSTEMI) showed severe native LCA disease with occcluded LCX and LAD, patent RCA stents, patent LIMA to LAD, SVG-OM had 70% stenosis s/p successful PCI of vein graft with 2 overlapping stents.   Echo 10/25/23 showed LVEF  25-30%, LV RWMAs, G2DD, mod-severe MAC with mild-mod MR/TR, moderate AI and severe LFLG AS: mean gradient 17.7 mmHg, V-max 2.81 m/s, [name] 0.93 cm, DVI 0.29, SVI 26.  The right and left aortic cusps were noted to be virtually immobile questioning fusion, while the posterior noncoronary cusp had near normal mobility. The regional wall motion abnormalities were not felt to correlate to his angiographic findings and felt to possibly indicate aortic stenosis as the cause of his cardiomyopathy. Outpatient TAVR consult was recommended and set up for 7/23. However, the patient is now readmitted with acute CHF as well as severe episodic SOB that correlates to runs of atrial tachycardia on telemetry.   I have reviewed the natural history of aortic stenosis with the patient. We have discussed the limitations of medical therapy and the poor prognosis associated with symptomatic aortic stenosis. We have reviewed potential treatment options, including palliative medical therapy, conventional surgical aortic valve replacement, and transcatheter aortic valve replacement. We discussed treatment options in the context of this patient's specific comorbid medical conditions.    The patient's predicted risk of mortality with conventional aortic valve replacement is 7.68% primarily based on age, LV dysfunction, extensive CAD with previous CABG with recent NSTEMI and intervention and acute CHF. Other significant comorbid conditions include frailty. TAVR seems like a reasonable  treatment option for this patient pending formal cardiac surgical consultation. We discussed typical evaluation which will require a gated cardiac CTA and a CTA of the chest/abdomen/pelvis to evaluate both his cardiac anatomy and peripheral vasculature. Will get CT scans inpatient, likely tomorrow.   Multivessel CAD: -- Status post CABG in 2014 with LIMA to LAD and SVG to circumflex/OM as well as DES to RCA.  Subsequently underwent PCI to SVG to OM in 2023  and just recently DES x 2 to SVG to OM on June 30 in the setting of NSTEMI.  LIMA to LAD patent with otherwise severe underlying native CAD.   -- High-sensitivity troponin I levels remain elevated in the 200s, but in flat pattern not suggestive of ACS.  -- Continue aspirin  and Brilinta   -- Continue imdur  30mg  daily.  -- Continue Crestor  40mg  daily.   Acute HFrEF: -- EF 35%. -- Continue IV Lasix  40mg  BID. -- Continue Toprol  XL 25mg  daily.  -- Continue spironolactone  12.5mg  daily.  -- Holding home Jardiance  with possible inpatient TAVR. -- Net negative 1.5L. Weight down ~ 1 lb ? Accuracy.   CKD stage IIIa: -- Creat stable at 1.3 -- Continue to monitor with diuresis.  DMT2: -- Continue SSI  Atrial tachycardia: -- Seem to correlate with SOB attacks. -- Continue Torpol XL 25mg  daily.     For questions or updates, please contact [facility] Please consult [email or web address] for contact info under    Signed, Luke Hummer, Luke Harvey  10/31/2023 2:07 PM  Patient seen, examined. Available data reviewed. Agree with findings, assessment, and plan as outlined by Luke Hummer, Luke Harvey.  The patient is independently interviewed and examined.  He is an elderly, frail-appearing male in no distress.  HEENT is normal, JVP is normal, carotid upstrokes normal without bruits, lungs with diminished bibasilar breath sounds right worse than left, inspiratory crackles bilaterally, heart regular rate and rhythm with distant heart sounds, 2/6 systolic murmur at the right upper sternal border, abdomen soft nontender, extremities with no edema, skin is warm and dry with no rash.  The patient has low-flow low gradient aortic stenosis and acute on chronic HFrEF with LVEF less than 35%.  He remains somewhat decompensated and highly symptomatic with marked exertional dyspnea and dyspneic spells that have some correlation with an atrial tachycardia.  I have personally reviewed his echo and cardiac catheterization  images.  His heart catheterization shows severe native vessel CAD with patent stents in the RCA, patency of the LIMA to LAD, and severe stenosis of the saphenous vein graft to OM, treated with PCI using drug-eluting stents.  The mean transaortic valve gradient at cardiac catheterization is 24 mmHg without any evidence of severe pulmonary hypertension.  Echo shows an LVEF of 25 to 30%, normal RV function, mild to moderate mitral regurgitation, and moderately severe low-flow low gradient aortic stenosis with fusion of the right and left aortic cusps and good mobility of the noncoronary cusp.  The mean gradient by echo is 18 mmHg with a reduced stroke-volume index of 26 and a dimensionless index of 0.29.  In summary, this patient with progressive symptoms of heart failure, now NYHA functional class III, exhibits findings concerning for severe low-flow low gradient aortic stenosis and an explanation for his progressive symptoms.  I reviewed the natural history of aortic stenosis today with the patient.  I called his daughter on the telephone and discussed his case with her at length.  At his advanced age and previous CABG, his treatment options would  likely be limited to transcatheter aortic valve replacement as I do not think he would be a candidate for redo surgery for aortic valve replacement.  The patient understands that he will need to undergo a gated CTA of the heart as well as a CTA of the chest, abdomen, and pelvis part of his TAVR evaluation.  He will then be seen by cardiac surgery in formal consultation as part of a multidisciplinary heart team review of his case.  Will increase his diuretic therapy so that he is receiving IV furosemide  twice daily and will update another portable chest x-ray to assess how much interstitial edema he currently has.  For his atrial tachycardia, he will continue to be treated with metoprolol  succinate.  Otherwise, as outlined above.  Luke Harvey, M.D. 10/31/2023 4:31  PM

## 2023-11-01 ENCOUNTER — Inpatient Hospital Stay (HOSPITAL_COMMUNITY)

## 2023-11-01 LAB — BASIC METABOLIC PANEL WITH GFR
Anion gap: 13 (ref 5–15)
BUN: 28 mg/dL — ABNORMAL HIGH (ref 8–23)
CO2: 25 mmol/L (ref 22–32)
Calcium: 9 mg/dL (ref 8.9–10.3)
Chloride: 98 mmol/L (ref 98–111)
Creatinine, Ser: 1.4 mg/dL — ABNORMAL HIGH (ref 0.61–1.24)
GFR, Estimated: 51 mL/min — ABNORMAL LOW (ref >60)
Glucose, Bld: 171 mg/dL — ABNORMAL HIGH (ref 70–99)
Potassium: 3.9 mmol/L (ref 3.5–5.1)
Sodium: 136 mmol/L (ref 135–145)

## 2023-11-01 LAB — CBC
HCT: 35.5 % — ABNORMAL LOW (ref 39.0–52.0)
Hemoglobin: 11.8 g/dL — ABNORMAL LOW (ref 13.0–17.0)
MCH: 28.7 pg (ref 26.0–34.0)
MCHC: 33.2 g/dL (ref 30.0–36.0)
MCV: 86.4 fL (ref 80.0–100.0)
Platelets: 398 K/uL (ref 150–400)
RBC: 4.11 MIL/uL — ABNORMAL LOW (ref 4.22–5.81)
RDW: 13.1 % (ref 11.5–15.5)
WBC: 9.2 K/uL (ref 4.0–10.5)
nRBC: 0 % (ref 0.0–0.2)

## 2023-11-01 LAB — GLUCOSE, CAPILLARY
Glucose-Capillary: 219 mg/dL — ABNORMAL HIGH (ref 70–99)
Glucose-Capillary: 211 mg/dL — ABNORMAL HIGH (ref 70–99)
Glucose-Capillary: 210 mg/dL — ABNORMAL HIGH (ref 70–99)

## 2023-11-01 LAB — MAGNESIUM: Magnesium: 2.4 mg/dL (ref 1.7–2.4)

## 2023-11-01 MED ORDER — METOPROLOL SUCCINATE ER 25 MG PO TB24
25.0000 mg | ORAL_TABLET | Freq: Two times a day (BID) | ORAL | Status: DC
  Administered 2023-11-01: 25 mg via ORAL
  Filled 2023-11-01 (×2): qty 1

## 2023-11-01 MED ORDER — INSULIN ASPART 100 UNIT/ML IJ SOLN
0.0000 [IU] | Freq: Three times a day (TID) | INTRAMUSCULAR | Status: DC
  Administered 2023-11-01: 3 [IU] via SUBCUTANEOUS
  Administered 2023-11-02: 1 [IU] via SUBCUTANEOUS
  Administered 2023-11-02 – 2023-11-03 (×3): 2 [IU] via SUBCUTANEOUS
  Administered 2023-11-03: 5 [IU] via SUBCUTANEOUS

## 2023-11-01 MED ORDER — POLYVINYL ALCOHOL 1.4 % OP SOLN
2.0000 [drp] | OPHTHALMIC | Status: DC | PRN
  Administered 2023-11-01 – 2023-11-06 (×3): 2 [drp] via OPHTHALMIC
  Filled 2023-11-01 (×2): qty 15

## 2023-11-01 MED ORDER — FUROSEMIDE 10 MG/ML IJ SOLN
80.0000 mg | Freq: Two times a day (BID) | INTRAMUSCULAR | Status: DC
  Administered 2023-11-01 – 2023-11-02 (×2): 80 mg via INTRAVENOUS
  Filled 2023-11-01 (×2): qty 8

## 2023-11-01 MED ORDER — CLOPIDOGREL BISULFATE 75 MG PO TABS
300.0000 mg | ORAL_TABLET | Freq: Once | ORAL | Status: AC
  Administered 2023-11-01: 300 mg via ORAL
  Filled 2023-11-01: qty 4

## 2023-11-01 MED ORDER — METOPROLOL SUCCINATE ER 25 MG PO TB24: 25.0000 mg | ORAL_TABLET | Freq: Every day | ORAL | Status: DC

## 2023-11-01 MED ORDER — INSULIN ASPART 100 UNIT/ML IJ SOLN
0.0000 [IU] | Freq: Every day | INTRAMUSCULAR | Status: DC
  Administered 2023-11-01 – 2023-11-03 (×2): 2 [IU] via SUBCUTANEOUS
  Administered 2023-11-04: 3 [IU] via SUBCUTANEOUS
  Administered 2023-11-07: 2 [IU] via SUBCUTANEOUS

## 2023-11-01 MED ORDER — POTASSIUM CHLORIDE CRYS ER 20 MEQ PO TBCR
40.0000 meq | EXTENDED_RELEASE_TABLET | Freq: Once | ORAL | Status: AC
  Administered 2023-11-01: 40 meq via ORAL
  Filled 2023-11-01: qty 2

## 2023-11-01 MED ORDER — CLOPIDOGREL BISULFATE 75 MG PO TABS
75.0000 mg | ORAL_TABLET | Freq: Every day | ORAL | Status: DC
  Administered 2023-11-02 – 2023-11-08 (×6): 75 mg via ORAL
  Filled 2023-11-01 (×6): qty 1

## 2023-11-01 NOTE — Progress Notes (Signed)
 Heart Failure Navigator Progress Note  Assessed for Heart & Vascular TOC clinic readiness.  Patient does not meet criteria due to Advanced Heart Failure team consulted. No HF TOC .   Navigator will sign off at this time.   Randie Bustle, BSN, Scientist, clinical (histocompatibility and immunogenetics) Only

## 2023-11-01 NOTE — Consult Note (Addendum)
 Advanced Heart Failure Team Consult Note   Primary Physician: [facility], [facility] Cardiologist:  Lonni LITTIE Nanas, MD  Reason for Consultation: Acute systolic heart failure  HPI:    Luke Harvey is seen today for evaluation of acute systolic heart failure at the request of Dr. Wonda, structural heart team/cardiology.   NEAL TRULSON is a 78 y.o. 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    Admitted 6/25 with NSTEMI. Multiple 80 to 90% stenoses in his SVG to his diagonal branch. He had PCI of his vein graft.   Now admitted with breath attacks that correlated with atrial tachycardia (in ED). Worsening SOB noted. ED labs/studies reviewed: HsTrop 258>255>298, K 4.6, Na 133, SCr 1.28. CXR with pulmonary edema and small-mod pleural effusion. Structural team following and working on TAVR workup but patient is unable to lay flat and SOB and his attacks aren't getting better. AHF team asked to see for optimization.   Resting comfortably in chair. He lives alone, his niece occasionally helps him. States his last attack was this morning with activity. He feels like everything just stops and he has no breath at all.   Echo studies reviewed:  Echo 7/25: EF 25 to 30%, findings suggest infarction/scar versus severe resting ischemia in the mid-distal LAD artery distribution and ischemia/partial scar in the proximal LAD and in the entire right coronary artery distribution.  LV with RWMA,GIIDD, akinesis throughout LV, RV normal, LA mildly dilated, mild to moderate MR/TR, hemodynamics consistent with moderate to severe LFLG AV stenosis, AV mean gradient 17.7 mmHg L/RHC 6/25: Severe Native Left Coronary Artery Disease with essentially occluded LCx and LAD, very small caliber ramus and OM1 remain within 90% distal LM stenosis. Patent RCA stents with mild stenosis between the stents but otherwise stable flow. Patent LIMA to  LAD. SVG-OM has ostial 70% stenosis, mid graft there is tandem 60 and 90% stenosis as well as 80% ISR in the proximal edge of the more distal stent. - Successful score flex angioplasty of the ISR and PTCA-DES PCI of the vein graft connecting the 2 previous stent using 2 overlapping Synergy Stents (3.0 mm x 32 Owen 3.0 mm x 24 mm with all stents deployed to roughly 3.3 mm using stent balloon for overlap post dilation all the way to the ostium. RHC:  RA 12, RVEDP-EDP 58/10-15 mmHg; PAP-mean 43/19-29 mmHg, PCWP 17; LV P-EDP 162/1-23 mmHg, AO P-MAP 126/50-81 mmHg. AoV: PSV gradient 38 mm 3 with a mean of 24 mmHg. (264 ms)-moderate stenosis. Ao sat 98%, PA sat 61%. Fick CO/CI 3.76-2.17 moderately reduced.  Echo 10/23: EF 56%  Home Medications Prior to Admission medications   Medication Sig Start Date End Date Taking? Authorizing Provider  acetaminophen  (TYLENOL ) 500 MG tablet Take 500 mg by mouth 2 (two) times daily.   Yes [provider]  APPLE CIDER VINEGAR PO Take 1 capsule by mouth daily at 12 noon.   Yes [provider]  aspirin  EC 81 MG tablet Take 81 mg by mouth daily at 12 noon.   Yes [provider]  BIOTIN PO Take 1 tablet by mouth daily at 12 noon.   Yes [provider]  carboxymethylcellulose (REFRESH PLUS) 0.5 % SOLN Place 1 drop into both eyes See admin instructions. Administer 1 drop into each eye every daily. May use every 12 hours if needed for dry eyes.   Yes [provider]  Cholecalciferol (VITAMIN D-3 PO) Take 1 tablet by mouth daily at 12 noon.   Yes [provider]  Coenzyme Q10 (COQ10 PO) Take 1 capsule by mouth daily at 12 noon.   Yes [provider]  Cyanocobalamin (VITAMIN B12 PO) Take 1 tablet by mouth daily at 12 noon.   Yes [provider]  diclofenac Sodium (VOLTAREN) 1 % GEL 1 Application every 6 (six) hours as needed for pain. 02/02/22  Yes [provider]  DULoxetine  (CYMBALTA ) 30 MG capsule  Take 30 mg by mouth at bedtime.   Yes [provider]  empagliflozin  (JARDIANCE ) 10 MG TABS tablet Take 1 tablet (10 mg total) by mouth daily. 10/27/23  Yes Amin, Ankit C, MD  fluticasone  (FLONASE ) 50 MCG/ACT nasal spray Place 1-2 sprays into both nostrils daily as needed for allergies or rhinitis.   Yes [provider]  isosorbide  mononitrate (IMDUR ) 30 MG 24 hr tablet Take 60 mg by mouth daily at 12 noon.   Yes [provider]  ketoconazole (NIZORAL) 2 % cream Apply 1 Application topically 2 (two) times daily as needed for irritation. 02/23/21  Yes [provider]  ketoconazole (NIZORAL) 2 % shampoo Apply 1 Application topically every 3 (three) days.   Yes [provider]  levOCARNitine (L-CARNITINE PO) Take 2 tablets by mouth daily at 12 noon.   Yes [provider]  levothyroxine  (SYNTHROID ) 75 MCG tablet Take 75 mcg by mouth daily before breakfast. 02/02/22  Yes [provider]  lidocaine  (LIDODERM ) 5 % Place 1 patch onto the skin daily as needed (pain).   Yes [provider]  loratadine  (CLARITIN ) 10 MG tablet Take 10 mg by mouth daily at 12 noon.   Yes [provider]  MAGNESIUM  PO Take 2 tablets by mouth daily at 12 noon.   Yes [provider]  metFORMIN  (GLUCOPHAGE ) 500 MG tablet Take 1,000 mg by mouth 2 (two) times daily as needed (BS > 140). 05/04/06  Yes [provider]  metoprolol  succinate (TOPROL -XL) 50 MG 24 hr tablet Take 1 tablet (50 mg total) by mouth daily. Take with or immediately following a meal. 10/27/23  Yes Amin, Ankit C, MD  MILK THISTLE PO Take 1 tablet by mouth daily at 12 noon.   Yes [provider]  Misc Natural Products (TURMERIC, CURCUMIN, PO) Take 1 tablet by mouth daily at 12 noon.   Yes [provider]  Multiple Vitamins-Minerals (PRESERVISION AREDS 2) CAPS Take 1 capsule by mouth 2 (two) times daily.   Yes [provider]  nitroGLYCERIN   (NITROSTAT ) 0.4 MG SL tablet Place 1 tablet (0.4 mg total) under the tongue See admin instructions. Take 1 tablet (0.4) mg by mouth every day. May take an additional 1 tablet later in the day if  chest pain. 10/26/23  Yes Amin, Ankit C, MD  OVER THE COUNTER MEDICATION Take 1 capsule by mouth daily at 12 noon. VisiUltra   Yes [provider]  OVER THE COUNTER MEDICATION Take 1 capsule by mouth daily at 12 noon. blood boost supplement for glucose control   Yes [provider]  Probiotic Product (PROBIOTIC PO) Take 2 capsules by mouth daily at 12 noon.   Yes [provider]  QUERCETIN PO Take 1 tablet by mouth daily at 12 noon.   Yes [provider]  rosuvastatin  (CRESTOR ) 40 MG tablet Take 1 tablet (40 mg total) by mouth daily. Patient taking differently: Take 40 mg by mouth at  bedtime. 04/05/20  Yes Dunn, Dayna N, PA-C  Saw Palmetto, Serenoa repens, (SAW PALMETTO PO) Take 1 tablet by mouth daily at 12 noon.   Yes [provider]  SELENIUM PO Take 1 tablet by mouth daily at 12 noon.   Yes [provider]  spironolactone  (ALDACTONE ) 25 MG tablet Take 0.5 tablets (12.5 mg total) by mouth daily. 10/26/23  Yes Amin, Ankit C, MD  ticagrelor  (BRILINTA ) 90 MG TABS tablet Take 1 tablet (90 mg total) by mouth 2 (two) times daily. 04/05/20  Yes Dunn, Dayna N, PA-C   Past Medical History: Past Medical History:  Diagnosis Date   Age-related macular degeneration, dry, both eyes    Anxiety    Aortic stenosis    Arthritis    probably all over (06/08/2016)   Cardiomyopathy (HCC)    a. EF 40-45% in 2018, normalized in 03/2020.   Chronic back pain    started in my lower back; going up my back in the last couple months (06/08/2016)   Chronic sinusitis    Coronary artery disease    a. s/p CABGx 2V (2014 in [place])  b. PCI (2015 in [place]). c. PCI 2018 ([facility]). d. PCI 03/2020 (complex - [facility]).   Diabetic peripheral neuropathy (HCC)    DVT (deep venous  thrombosis) (HCC)    left groin; it was there when I had bypass OR; get it checked q yr; still there now (06/08/2016)   GERD (gastroesophageal reflux disease)    Hepatitis B    History of bleeding ulcers    History of diverticulitis    History of hiatal hernia    HLD (hyperlipidemia)    HTN (hypertension)    LV dysfunction, post MI EF 40-45%. 06/09/2016   NSTEMI (non-ST elevated myocardial infarction) (HCC) 06/08/2016   Pneumonia    3-4 times maybe (06/08/2016)   PVD (peripheral vascular disease) (HCC)    Recovering alcoholic (HCC)    picked up my 30 year chip the other day (06/08/2016)   S/P angioplasty with stent, 06/08/16 DES, for in-stent restenosis in RCA. 06/09/2016   Sleep apnea    got a mask after OR; don't use it (06/08/2016)   Type II diabetes mellitus (HCC)    Past Surgical History: Past Surgical History:  Procedure Laterality Date   ANTERIOR CERVICAL DECOMP/DISCECTOMY FUSION     BACK SURGERY     CARDIAC CATHETERIZATION  03/2012   led to bypass   CAROTID ENDARTERECTOMY Right ~ 2014   COLONOSCOPY W/ BIOPSIES AND POLYPECTOMY     CORONARY ANGIOPLASTY WITH STENT PLACEMENT  2015; 06/08/2016   CORONARY ARTERY BYPASS GRAFT  04/10/2012   CORONARY ATHERECTOMY N/A 04/04/2020   Procedure: CORONARY ATHERECTOMY;  Surgeon: Burnard Debby LABOR, MD;  Location: [facility];  Service: Cardiovascular;  Laterality: N/A;   CORONARY STENT INTERVENTION N/A 06/08/2016   Procedure: Coronary Stent Intervention;  Surgeon: Debby LABOR Burnard, MD;  Location: [facility];  Service: Cardiovascular;  Laterality: N/A;   CORONARY STENT INTERVENTION N/A 04/02/2020   Procedure: CORONARY STENT INTERVENTION;  Surgeon: Anner Alm ORN, MD;  Location: [facility] INVASIVE CV LAB;  Service: Cardiovascular;  Laterality: N/A;   CORONARY STENT INTERVENTION N/A 04/04/2020   Procedure: CORONARY STENT INTERVENTION;  Surgeon: Burnard Debby LABOR, MD;  Location: [facility];  Service: Cardiovascular;  Laterality:  N/A;   CORONARY STENT INTERVENTION N/A 11/16/2021   Procedure: CORONARY STENT INTERVENTION;  Surgeon: Mady Bruckner, MD;  Location: [facility];  Service: Cardiovascular;  Laterality: N/A;   CORONARY STENT INTERVENTION N/A 10/24/2023   Procedure: CORONARY STENT INTERVENTION;  Surgeon: Anner Alm ORN, MD;  Location[facility] INVASIVE CV LAB;  Service: Cardiovascular;  Laterality: N/A;   CORONARY ULTRASOUND/IVUS N/A 04/04/2020   Procedure: Intravascular Ultrasound/IVUS;  Surgeon: Burnard Debby LABOR, MD;  Location: [facility] INVASIVE CV LAB;  Service: Cardiovascular;  Laterality: N/A;   JOINT REPLACEMENT     LEFT HEART CATH AND CORONARY ANGIOGRAPHY N/A 06/08/2016   Procedure: Left Heart Cath and Coronary Angiography;  Surgeon: Debby LABOR Burnard, MD;  Location: [facility];  Service: Cardiovascular;  Laterality: N/A;   LEFT HEART CATH AND CORS/GRAFTS ANGIOGRAPHY N/A 04/02/2020   Procedure: LEFT HEART CATH AND CORS/GRAFTS ANGIOGRAPHY;  Surgeon: Anner Alm ORN, MD;  Location: [facility] INVASIVE CV LAB;  Service: Cardiovascular;  Laterality: N/A;   LEFT HEART CATH AND CORS/GRAFTS ANGIOGRAPHY N/A 11/16/2021   Procedure: LEFT HEART CATH AND CORS/GRAFTS ANGIOGRAPHY;  Surgeon: Mady Bruckner, MD;  Location: [facility];  Service: Cardiovascular;  Laterality: N/A;   REVISION TOTAL HIP ARTHROPLASTY Left ~ 2004   RIGHT/LEFT HEART CATH AND CORONARY ANGIOGRAPHY N/A 10/24/2023   Procedure: RIGHT/LEFT HEART CATH AND CORONARY ANGIOGRAPHY;  Surgeon: Anner Alm ORN, MD;  Location: [facility] INVASIVE CV LAB;  Service: Cardiovascular;  Laterality: N/A;   TEMPORARY PACEMAKER N/A 04/04/2020   Procedure: TEMPORARY PACEMAKER;  Surgeon: Burnard Debby LABOR, MD;  Location: [facility] INVASIVE CV LAB;  Service: Cardiovascular;  Laterality: N/A;   TONSILLECTOMY     TOTAL HIP ARTHROPLASTY Left 1990s   Family History: Family History  Problem Relation Age of Onset   Cancer Mother    Social History: Social History   Socioeconomic History    Marital status: Divorced    Spouse name: Not on file   Number of children: 2   Years of education: Not on file   Highest education level: GED or equivalent  Occupational History   Occupation: Retired  Tobacco Use   Smoking status: Former    Current packs/day: 0.00    Average packs/day: 1 pack/day for 40.0 years (40.0 ttl pk-yrs)    Types: Cigarettes    Start date: 07/17/1968    Quit date: 07/17/2008    Years since quitting: 15.3   Smokeless tobacco: Never  Substance and Sexual Activity   Alcohol  use: No    Comment: recovering alcoholic, abstinence 30 years.  attends AA weekly   Drug use: No   Sexual activity: Never  Other Topics Concern   Not on file  Social History Narrative   Not on file   Social Drivers of Health   Financial Resource Strain: Not on file  Food Insecurity: No Food Insecurity (10/29/2023)   Hunger Vital Sign    Worried About Running Out of Food in the Last Year: Never true    Ran Out of Food in the Last Year: Never true  Transportation Needs: No Transportation Needs (10/29/2023)   PRAPARE - Administrator, Civil Service (Medical): No    Lack of Transportation (Non-Medical): No  Physical Activity: Not on file  Stress: Not on file  Social Connections: Unknown (10/30/2023)   Social Connection and Isolation Panel    Frequency of Communication with Friends and Family: Never    Frequency of Social Gatherings with Friends and Family: Never    Attends Religious Services: Never    Database administrator or Organizations: Patient declined    Attends Banker Meetings: Patient  declined    Marital Status: Not on file    Allergies:  Allergies  Allergen Reactions   Tetracyclines & Related Hives and Itching   Cozaar [Losartan] Cough   Zestril  [Lisinopril ] Cough   Lipitor [Atorvastatin] Other (See Comments)    Myalgias    Objective:   Vital Signs:   Temp:  [97.4 F (36.3 C)-98.6 F (37 C)] 98.6 F (37 C) (07/08 0810) Pulse Rate:   [84-104] 84 (07/08 0810) Resp:  [17-20] 17 (07/08 0810) BP: (102-115)/(49-80) 109/77 (07/08 0810) SpO2:  [97 %-100 %] 100 % (07/08 0810) Weight:  [63.4 kg] 63.4 kg (07/08 0413) Last BM Date : 10/31/23  Weight change: Filed Weights   10/29/23 2315 10/31/23 0323 11/01/23 0413  Weight: 63.4 kg 63 kg 63.4 kg    Intake/Output:   Intake/Output Summary (Last 24 hours) at 11/01/2023 1002 Last data filed at 11/01/2023 0416 Gross per 24 hour  Intake 240 ml  Output 1525 ml  Net -1285 ml    Physical Exam  General:  well appearing.  No respiratory difficulty. Sitting up in chair Neck: supple. JVD ~6 cm.  Cor: PMI nondisplaced. Regular rate & rhythm. No rubs, gallops or murmurs. Lungs: clear, diminished bases Extremities: no cyanosis, clubbing, rash, edema  Neuro: alert & oriented x 3. Moves all 4 extremities w/o difficulty. Affect pleasant.  Telemetry   NSR 80s (Personally reviewed)    EKG    NSR, LVH 87 bpm  Labs   Basic Metabolic Panel: Recent Labs  Lab 10/26/23 0350 10/29/23 1545 10/30/23 0350 10/31/23 0349 11/01/23 0303  NA 135 133* 134* 136 136  K 4.9 4.6 4.4 4.1 3.9  CL 101 98 97* 100 98  CO2 19* 21* 24 23 25   GLUCOSE 170* 165* 224* 158* 171*  BUN 22 28* 29* 26* 28*  CREATININE 1.22 1.28* 1.46* 1.30* 1.40*  CALCIUM  9.2 9.2 9.2 9.1 9.0  MG 2.2  --   --   --   --     Liver Function Tests: No results for input(s): AST, ALT, ALKPHOS, BILITOT, PROT, ALBUMIN in the last 168 hours. No results for input(s): LIPASE, AMYLASE in the last 168 hours. No results for input(s): AMMONIA in the last 168 hours.  CBC: Recent Labs  Lab 10/26/23 1113 10/29/23 1545 10/30/23 0350 10/31/23 0349 11/01/23 0303  WBC 9.4 8.8 7.9 9.2 9.2  HGB 12.7* 11.5* 12.1* 11.8* 11.8*  HCT 38.4* 35.2* 36.9* 36.8* 35.5*  MCV 84.4 87.3 85.6 87.6 86.4  PLT 415* 405* 401* 398 398   Cardiac Enzymes: Recent Labs  Lab 10/26/23 0350  CKTOTAL 144   BNP: BNP (last 3  results) Recent Labs    10/23/23 1730 10/29/23 1930  BNP 873.7* 1,155.5*   ProBNP (last 3 results) No results for input(s): PROBNP in the last 8760 hours.  CBG: Recent Labs  Lab 10/26/23 1302 10/31/23 0611  GLUCAP 212* 170*   Coagulation Studies: No results for input(s): LABPROT, INR in the last 72 hours. Imaging   DG CHEST PORT 1 VIEW Result Date: 10/31/2023 CLINICAL DATA:  Breathing issues for 2 weeks EXAM: PORTABLE CHEST 1 VIEW COMPARISON:  10/29/2023, 03/29/2022 FINDINGS: Sternotomy and coronary stents. Mild cardiomegaly with vascular congestion and diffuse interstitial edema. Small right greater than left pleural effusions. No pneumothorax. IMPRESSION: Cardiomegaly with vascular congestion and diffuse interstitial edema. Small right greater than left pleural effusions. These findings do not appear significantly worsened compared with 10/29/2023 Electronically Signed   By: Luke Scott HERO.D.  On: 10/31/2023 17:57    Medications:   Current Medications:  aspirin  EC  81 mg Oral Q1200   DULoxetine   30 mg Oral QHS   furosemide   40 mg Intravenous BID   heparin   5,000 Units Subcutaneous Q8H   isosorbide  mononitrate  30 mg Oral Daily   levothyroxine   75 mcg Oral Q0600   loratadine   10 mg Oral Q1200   metoprolol  succinate  25 mg Oral Daily   rosuvastatin   40 mg Oral Daily   sodium chloride  flush  3 mL Intravenous Q12H   spironolactone   12.5 mg Oral Daily   ticagrelor   90 mg Oral BID    Infusions:   Patient Profile   JOANDRY SLAGTER is a 78 y.o. 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  HF to see for acute systolic heart failure  Assessment/Plan  Acute systolic heart failure - Echo 2/74: EF 25 -30%, findings suggest infarction/scar versus severe resting ischemia in the mid-distal LAD artery distribution and ischemia/partial scar in the proximal LAD and in the entire right coronary artery distribution.   LV with RWMA,GIIDD, akinesis throughout LV, RV normal, LA mildly dilated, mild to moderate MR/TR - RHC 6/25:  RA 12, RVEDP-EDP 58/10-15 mmHg; PAP-mean 43/19-29 mmHg, PCWP 17; LV P-EDP 162/1-23 mmHg, AO P-MAP 126/50-81 mmHg. AoV: PSV gradient 38 mm 3 with a mean of 24 mmHg. (264 ms)-moderate stenosis. Ao sat 98%, PA sat 61%. Fick CO/CI 3.76-2.17 moderately reduced. - NYHA IV on admission - SOB out of proportion to AHF. ReDs clip 35%, checked x2. Does note appear volume overloaded on exam. Can continue lasix  40 IV BID - Continue Toprol  XL 25 mg daily - Continue spiro 12.5 mg daily - Holding start of SGLT2i with possible TAVR - BP too soft for ARB/ARNi - Consider RHC to access cardiac pressures and output. RHC last month was marginal with elevated pressures.   SOB - SOB attacks - correlated with atrial tachycardia in ED - Out of proportion with heart failure. Volume appears stable - Chest CT?  Multivessel CAD - L/RHC 6/25: Severe Native L CAD with essentially occluded LCx and LAD, very small caliber ramus and OM1 remain within 90% distal LM stenosis. Patent RCA stents with mild stenosis between the stents but otherwise stable flow. Patent LIMA to LAD. SVG-OM has ostial 70% stenosis, mid graft there is tandem 60 and 90% stenosis as well as 80% ISR in the proximal edge of the more distal stent. Successful score flex angioplasty of the ISR and PTCA-DES PCI of the vein graft connecting the 2 previous stent using 2 overlapping Synergy Stents (3.0 mm x 32 Owen 3.0 mm x 24 mm with all stents deployed to roughly 3.3 mm using stent balloon for overlap post dilation all the way to the ostium. - Denies CP - Continue ASA, plavix , statin - Continue Imdur   Severe LFLG AS  - Echo 7/25: hemodynamics consistent with moderate to severe LFLG AV stenosis, AV mean gradient 17.7 mmHg - Structural heart team following - Plan to complete TAVR scans once breathing has improved  Atrial tachycardia - Noted in the  ED, appeared to correlate with attacks - Continue Toprol  XL 25 mg daily   Length of Stay: 3  Beckey LITTIE Coe, NP  11/01/2023, 10:02 AM   Advanced Heart Failure Team Pager [phone] (M-F; 7a - 5p)  Please contact [facility] for night-coverage after hours (4p -7a )  and weekends on [email or web address]   Patient seen with NP, I formulated the plan and agree with the above note.   H/o CABG, recent NSTEMI with DES to SVG-OM (6/25).  RHC at that time showed mildly elevated filling pressures.  Echo in 7/25 showed EF 25-30% with regional WMAs, normal RV, mild-moderate MR, mild-moderate TR, moderate-severe low flow/low gradient AS with mean gradient 18 mmHg and [name] 0.91 cm^2. Patient was readmitted with episodes of dyspnea.  He will get spells of shortness of breath, sometimes when exerting himself and sometimes at rest.  He has been getting IV Lasix  in the hospital and says that it has helped.    Review of telemetry has shown episodes of elevated HR in 120s, not clear if this is sinus tachycardia or atrial tachycardia.   REDS clip 35%.   General: NAD Neck: No JVD, no thyromegaly or thyroid nodule.  Lungs: Clear to auscultation bilaterally with normal respiratory effort. CV: Nondisplaced PMI.  Heart regular S1/S2, no S3/S4, 1/6 SEM RUSGB.  No peripheral edema.  No carotid bruit.  Difficult to palpate pedal pulses.  Abdomen: Soft, nontender, no hepatosplenomegaly, no distention.  Skin: Intact without lesions or rashes.  Neurologic: Alert and oriented x 3.  Psych: Normal affect. Extremities: No clubbing or cyanosis.  HEENT: Normal.   1. Acute on chronic systolic CHF: Echo in 7/25 with EF 25-30% with regional WMAs, normal RV, mild-moderate MR, mild-moderate TR, moderate-severe low flow/low gradient AS with mean gradient 18 mmHg and [name] 0.91 cm^2. With wall motion abnormalities, I suspect this is primarily ischemic cardiomyopathy.  He has moderate-severe low flow/low gradient AS that is not critical but  likely contributes to CHF.  On exam, he does not look particularly volume overloaded.  REDS clip is borderline at 35% and CXR showed vascular congestion. He has felt better with IV Lasix . His spells of dyspnea are not totally clear to me, ?reaction to ticagrelor  or possibly triggered by episodes of atrial tachycardia (short runs of tachy noted this admission, ?AT vs sinus tachy). Symptoms do seem out of proportion to CHF.  - Will give Lasix  80 mg IV x 1 this afternoon (had 40 mg IV this morning).  Reassess volume status and symptoms tomorrow morning.  - Continue spironolactone  12.5 - Jardiance  on hold for TAVR.  - Continue Toprol  XL.  2. Aortic stenosis: Moderate-severe LF/LG AS on 7/25 echo.  Not critical AS but think that it contributes to his dyspnea.   - TAVR CT scans tomorrow.  - Plan TAVR later this week.  3. CKD stage 3: Creatinine 1.4, follow closely with diuresis.  4. CAD: S/p CABG in 2014 with LIMA-LAD and SVG-OM.  NSTEMI in 6/25 with 80-90% stenoses in SVG-OM, now s/p DES.  No chest pain.  - Continue Crestor  - Replace ticagrelor  with Plavix , ?ticagrelor  contributing to dyspnea.  5. SVT: Patient has periodic episdoes SVT.  ?Sinus tachycardia versus atrial tachycardia. These episodes may be related to his spells of dyspnea.  - Increase Toprol  XL to 25 mg bid.   Ezra Shuck 11/01/2023 1:50 PM

## 2023-11-01 NOTE — Progress Notes (Signed)
 SATURATION QUALIFICATIONS: (This note is used to comply with regulatory documentation for home oxygen)  Patient Saturations on Room Air at Rest = 98%  Patient Saturations on Room Air while Ambulating = 94%  No O2 was use while walking. Was done by NT

## 2023-11-01 NOTE — Plan of Care (Signed)
   Problem: Health Behavior/Discharge Planning: Goal: Ability to manage health-related needs will improve Outcome: Progressing

## 2023-11-01 NOTE — Progress Notes (Addendum)
 HEART AND VASCULAR CENTER   MULTIDISCIPLINARY HEART VALVE TEAM  Patient Name: SILVER PARKEY Date of Encounter: 11/01/2023  Admit date: 10/29/2023  Primary Care Provider: [facility] HeartCare Cardiologist: Lonni LITTIE Nanas, MD  [facility] HeartCare Electrophysiologist:  None   [facility] Problem List     Principal Problem:   Heart failure [facility]) Active Problems:   Nonrheumatic aortic (valve) stenosis     Subjective   Still having attacks, mostly at night and has to sit up on the side of the bed to breath. Also had another attack on the way back from the bathroom this morning. Has some mild aching in his back   Inpatient Medications    Scheduled Meds:  aspirin  EC  81 mg Oral Q1200   DULoxetine   30 mg Oral QHS   furosemide   40 mg Intravenous BID   heparin   5,000 Units Subcutaneous Q8H   isosorbide  mononitrate  30 mg Oral Daily   levothyroxine   75 mcg Oral Q0600   loratadine   10 mg Oral Q1200   metoprolol  succinate  25 mg Oral Daily   rosuvastatin   40 mg Oral Daily   sodium chloride  flush  3 mL Intravenous Q12H   spironolactone   12.5 mg Oral Daily   ticagrelor   90 mg Oral BID   Continuous Infusions:  PRN Meds: acetaminophen , fluticasone , ondansetron  (ZOFRAN ) IV, sodium chloride  flush   Vital Signs    Vitals:   10/31/23 1953 11/01/23 0145 11/01/23 0413 11/01/23 0810  BP: 102/60 (!) 110/49 (!) 115/59 109/77  Pulse: 90 86 88 84  Resp: 20 20 20 17   Temp: 97.6 F (36.4 C) (!) 97.4 F (36.3 C) (!) 97.4 F (36.3 C) 98.6 F (37 C)  TempSrc: Oral Oral Oral Oral  SpO2: 99% 98% 99% 100%  Weight:   63.4 kg   Height:        Intake/Output Summary (Last 24 hours) at 11/01/2023 0851 Last data filed at 11/01/2023 0416 Gross per 24 hour  Intake 240 ml  Output 1525 ml  Net -1285 ml   Filed Weights   10/29/23 2315 10/31/23 0323 11/01/23 0413  Weight: 63.4 kg 63 kg 63.4 kg    Physical Exam    GEN: Well nourished, well developed, in no acute distress.   HEENT: Grossly normal.  Neck: Supple, no JVD, carotid bruits, or masses. Cardiac: RRR, 1/6 systolic murmur heard best at LUSB. No rubs, or gallops Respiratory:  Decreased breath sounds at bases and right lung.  GI: Soft, nontender, nondistended, BS + x 4. MS: no deformity or atrophy. Skin: warm and dry, no rash. Neuro:  Strength and sensation are intact. Psych: AAOx3.  Normal affect.  Labs    CBC Recent Labs    10/31/23 0349 11/01/23 0303  WBC 9.2 9.2  HGB 11.8* 11.8*  HCT 36.8* 35.5*  MCV 87.6 86.4  PLT 398 398   Basic Metabolic Panel Recent Labs    92/92/74 0349 11/01/23 0303  NA 136 136  K 4.1 3.9  CL 100 98  CO2 23 25  GLUCOSE 158* 171*  BUN 26* 28*  CREATININE 1.30* 1.40*  CALCIUM  9.1 9.0   Telemetry    sinus - Personally Reviewed  ECG    Sinus, IVCD, HR 86 - Personally Reviewed  Radiology    DG CHEST PORT 1 VIEW Result Date: 10/31/2023 CLINICAL DATA:  Breathing issues for 2 weeks EXAM: PORTABLE CHEST 1 VIEW COMPARISON:  10/29/2023, 03/29/2022 FINDINGS: Sternotomy and coronary stents. Mild cardiomegaly  with vascular congestion and diffuse interstitial edema. Small right greater than left pleural effusions. No pneumothorax. IMPRESSION: Cardiomegaly with vascular congestion and diffuse interstitial edema. Small right greater than left pleural effusions. These findings do not appear significantly worsened compared with 10/29/2023 Electronically Signed   By: Luke Bun M.D.   On: 10/31/2023 17:57    Patient Profile     AYUUB PENLEY is a 78 y.o. male with a hx of CAD s/p CABG x2V (2014), multiple PCI's, CKD stage IIIa, ICM w/ EF of 25 to 30%, HLD, T2DM, and LFLG AS who is being seen today for the evaluation of severe AS at the request of Dr. Debera.   Assessment & Plan    Severe LFLG AS: -- Plan TAVR work up.  -- Will get TAVR CTs when stable from a heart failure standpoint.  Multivessel CAD: -- Status post CABG in 2014 with LIMA to LAD and SVG  to circumflex/OM as well as DES to RCA.  Subsequently underwent PCI to SVG to OM in 2023 and just recently DES x 2 to SVG to OM on June 30 in the setting of NSTEMI.  LIMA to LAD patent with otherwise severe underlying native CAD.   -- High-sensitivity troponin I levels remain elevated in the 200s, but in flat pattern not suggestive of ACS.  -- Continue aspirin  and Brilinta   -- Continue imdur  30mg  daily.  -- Continue Crestor  40mg  daily.    Acute HFrEF: -- EF 35%. -- Continue IV Lasix  40mg  BID. -- Continue Toprol  XL 25mg  daily.  -- Continue spironolactone  12.5mg  daily.  -- Holding home Jardiance  with possible inpatient TAVR. -- Net negative 2.2L, but still having significant orthopnea and PND. Repeat CXR showed continued pulm vascular congestion with small R>L pleural effusions. Continue IV Lasix  at current dosing.  -- Will ask the advanced CHF team to see    CKD stage IIIa: -- Creat stable at 1.4 -- Continue to monitor with diuresis.   DMT2: -- Continue SSI   Atrial tachycardia: -- Seem to correlate with SOB attacks. -- Continue Torpol XL 25mg  daily.   [name], PA-C  11/01/2023, 8:51 AM  Pager [phone]  Patient seen, examined. Available data reviewed. Agree with findings, assessment, and plan as outlined by Izetta Hummer, PA-C.  The patient is independently interviewed and examined.  He is an elderly male in no acute distress, sitting up in a chair at the bedside on 2 L of O2 per nasal cannula.  HEENT is normal, JVP is normal, lungs have bibasilar crackles.  Heart is regular rate and rhythm with a 2/6 systolic murmur and distant heart sounds at the right upper sternal border, abdomen is soft and nontender, lower extremities are thin with no edema, skin is warm and dry with no rash.  Patient has been continued on IV furosemide  40 mg twice daily, metoprolol  succinate, and spironolactone .  Chest x-ray yesterday confirmed vascular congestion and diffuse interstitial edema  with small bilateral pleural effusions.  In summary, the patient presents with acute on chronic HFrEF in the setting of severe low-flow low gradient aortic stenosis.  We are going to ask the advanced heart failure team to see him in consultation to help with management of his heart failure in anticipation of TAVR during this hospital admission.  Will plan to do his CTA studies for TAVR planning in the next 24 hours, then will request formal cardiac surgical consultation as part of a multidisciplinary approach to his care.  I suspect  his symptoms are primarily related to heart failure, but with such a strong component of episodic dyspnea, we will discontinue ticagrelor  and change him to clopidogrel  in case this is contributing to his breathlessness.  [name], M.D. 11/01/2023 11:27 AM

## 2023-11-01 NOTE — Progress Notes (Signed)
 REDS Clip  READING (normal 20-35%) = 35%  CHEST RULER (in) =28 Clip Station =  C    Reds reading completed x 2, Reading of 35 %. Patient tolerated well. Beckey Coe, NP notified.   Stephane Haddock, BSN, Scientist, clinical (histocompatibility and immunogenetics) Only

## 2023-11-02 ENCOUNTER — Inpatient Hospital Stay (HOSPITAL_COMMUNITY)

## 2023-11-02 DIAGNOSIS — Z955 Presence of coronary angioplasty implant and graft: Secondary | ICD-10-CM

## 2023-11-02 DIAGNOSIS — E785 Hyperlipidemia, unspecified: Secondary | ICD-10-CM

## 2023-11-02 DIAGNOSIS — I350 Nonrheumatic aortic (valve) stenosis: Secondary | ICD-10-CM

## 2023-11-02 DIAGNOSIS — Z87891 Personal history of nicotine dependence: Secondary | ICD-10-CM

## 2023-11-02 DIAGNOSIS — I255 Ischemic cardiomyopathy: Secondary | ICD-10-CM

## 2023-11-02 DIAGNOSIS — N1831 Chronic kidney disease, stage 3a: Secondary | ICD-10-CM

## 2023-11-02 DIAGNOSIS — Z951 Presence of aortocoronary bypass graft: Secondary | ICD-10-CM

## 2023-11-02 DIAGNOSIS — E1159 Type 2 diabetes mellitus with other circulatory complications: Secondary | ICD-10-CM

## 2023-11-02 DIAGNOSIS — I2510 Atherosclerotic heart disease of native coronary artery without angina pectoris: Secondary | ICD-10-CM

## 2023-11-02 DIAGNOSIS — I5021 Acute systolic (congestive) heart failure: Secondary | ICD-10-CM

## 2023-11-02 DIAGNOSIS — I214 Non-ST elevation (NSTEMI) myocardial infarction: Secondary | ICD-10-CM

## 2023-11-02 LAB — CBC
HCT: 34.3 % — ABNORMAL LOW (ref 39.0–52.0)
Hemoglobin: 11.6 g/dL — ABNORMAL LOW (ref 13.0–17.0)
MCH: 28.4 pg (ref 26.0–34.0)
MCHC: 33.8 g/dL (ref 30.0–36.0)
MCV: 84.1 fL (ref 80.0–100.0)
Platelets: 373 K/uL (ref 150–400)
RBC: 4.08 MIL/uL — ABNORMAL LOW (ref 4.22–5.81)
RDW: 13.2 % (ref 11.5–15.5)
WBC: 9.1 K/uL (ref 4.0–10.5)
nRBC: 0 % (ref 0.0–0.2)

## 2023-11-02 LAB — GLUCOSE, CAPILLARY
Glucose-Capillary: 131 mg/dL — ABNORMAL HIGH (ref 70–99)
Glucose-Capillary: 197 mg/dL — ABNORMAL HIGH (ref 70–99)
Glucose-Capillary: 158 mg/dL — ABNORMAL HIGH (ref 70–99)
Glucose-Capillary: 167 mg/dL — ABNORMAL HIGH (ref 70–99)

## 2023-11-02 LAB — BASIC METABOLIC PANEL WITH GFR
Anion gap: 14 (ref 5–15)
BUN: 30 mg/dL — ABNORMAL HIGH (ref 8–23)
CO2: 25 mmol/L (ref 22–32)
Calcium: 9 mg/dL (ref 8.9–10.3)
Chloride: 97 mmol/L — ABNORMAL LOW (ref 98–111)
Creatinine, Ser: 1.49 mg/dL — ABNORMAL HIGH (ref 0.61–1.24)
GFR, Estimated: 47 mL/min — ABNORMAL LOW (ref >60)
Glucose, Bld: 135 mg/dL — ABNORMAL HIGH (ref 70–99)
Potassium: 3.8 mmol/L (ref 3.5–5.1)
Sodium: 136 mmol/L (ref 135–145)

## 2023-11-02 MED ORDER — MELATONIN 3 MG PO TABS: 3.0000 mg | ORAL_TABLET | Freq: Every day | ORAL | Status: DC

## 2023-11-02 MED ORDER — IOHEXOL 350 MG/ML SOLN
100.0000 mL | Freq: Once | INTRAVENOUS | Status: AC | PRN
  Administered 2023-11-02: 100 mL via INTRAVENOUS

## 2023-11-02 MED ORDER — MELATONIN 3 MG PO TABS
3.0000 mg | ORAL_TABLET | Freq: Once | ORAL | Status: AC
  Administered 2023-11-02: 3 mg via ORAL
  Filled 2023-11-02: qty 1

## 2023-11-02 MED ORDER — METOPROLOL SUCCINATE ER 25 MG PO TB24
25.0000 mg | ORAL_TABLET | Freq: Every day | ORAL | Status: DC
  Administered 2023-11-03 – 2023-11-07 (×5): 25 mg via ORAL
  Filled 2023-11-02 (×5): qty 1

## 2023-11-02 MED ORDER — LIDOCAINE HCL 1 % IJ SOLN
INTRAMUSCULAR | Status: AC
  Filled 2023-11-02: qty 20

## 2023-11-02 NOTE — Evaluation (Signed)
 Physical Therapy Evaluation Patient Details Name: Luke Harvey MRN: [id number] DOB: [birth year] Today's Date: 11/02/2023  History of Present Illness  Pt is a 78 y.o. male admitted 10/29/23 due to episodic SOB. Pt with severe LFLG AS with plan for TAVR 7/11. Chest x-ray showed R>L pleural effusion and interstitial edema. 7/9 R thoracentesis. Prior admit 6/29 with NSTEMI s/p PCI. PMH: CAD, CABG x2, s/p stent to SVG, severe native vessel disease, ischemic cardiomyopathy, HTN, HLD, history of DVT   Clinical Impression  Pt in bed upon arrival and agreeable to PT eval. PTA, pt was ModI with hurrycane for short distances (~32ft at a time). Pt has home set-up with chairs placed throughout for seated rest breaks due to increased fatigue/SOB. In today's session, pt required supervision for bed mobility and CGA to stand and ambulate 44ft with hurrycane. Pt is currently limited by decreased activity tolerance and impaired cardiopulmonary status. Pt has intermittent level of assist available at home with potential for more assist from daughter if needed. Recommending post-acute HHPT with likely need for cardiac rehab to work towards independence with mobility. Pt would benefit from acute skilled PT with current functional limitations listed below (see PT Problem List). Acute PT to follow.  HR 89-105 BPM        If plan is discharge home, recommend the following: A little help with walking and/or transfers;Assistance with cooking/housework;Direct supervision/assist for medications management;Direct supervision/assist for financial management;Assist for transportation;Help with stairs or ramp for entrance   Can travel by private vehicle    Yes    Equipment Recommendations None recommended by PT     Functional Status Assessment Patient has had a recent decline in their functional status and demonstrates the ability to make significant improvements in function in a reasonable and predictable amount of time.      Precautions / Restrictions Precautions Precautions: Fall Restrictions Weight Bearing Restrictions Per Provider Order: No      Mobility  Bed Mobility Overal bed mobility: Needs Assistance Bed Mobility: Supine to Sit    Supine to sit: HOB elevated, Supervision    General bed mobility comments: supervision for safety, increased time    Transfers Overall transfer level: Needs assistance Equipment used: None (hurrycane) Transfers: Sit to/from Stand Sit to Stand: Contact guard assist  General transfer comment: CGA for safety    Ambulation/Gait Ambulation/Gait assistance: Contact guard assist Gait Distance (Feet): 40 Feet Assistive device:  (hurrycane) Gait Pattern/deviations: Step-through pattern, Decreased stride length, Narrow base of support Gait velocity: decreased     General Gait Details: Slow and steady gait pattern. No breathing attacks however pt reported feeling SOB with VSS on RA.    Balance Overall balance assessment: Needs assistance, History of Falls Sitting-balance support: Feet supported, No upper extremity supported Sitting balance-Leahy Scale: Fair     Standing balance support: Single extremity supported, During functional activity, Reliant on assistive device for balance Standing balance-Leahy Scale: Poor Standing balance comment: reliant on 1UE support for standing balance        Pertinent Vitals/Pain Pain Assessment Pain Assessment: No/denies pain    Home Living Family/patient expects to be discharged to:: Private residence Living Arrangements: Alone Available Help at Discharge: Family;Available PRN/intermittently Type of Home: House Home Access: Stairs to enter   Entrance Stairs-Number of Steps: 3   Home Layout: One level Home Equipment: Agricultural consultant (2 wheels);Other (comment);Grab bars - tub/shower;Shower seat - built Air traffic controller)      Prior Function Prior Level of Function : Independent/Modified  Independent    Mobility Comments: ModI with hurrycane for ~36ft at a time. Has chairs set up for seated rest breaks. Reports 1 fall in past 6 months ADLs Comments: use of AE for LB dressing, does microwaveable meals     Extremity/Trunk Assessment   Upper Extremity Assessment Upper Extremity Assessment: Defer to OT evaluation    Lower Extremity Assessment Lower Extremity Assessment: LLE deficits/detail LLE Deficits / Details: Reports hx of sciatic pain, at least 3/5 LLE Sensation: WNL    Cervical / Trunk Assessment Cervical / Trunk Assessment: Normal  Communication   Communication Communication: Impaired Factors Affecting Communication: Hearing impaired (has hearing aids)    Cognition Arousal: Alert Behavior During Therapy: WFL for tasks assessed/performed   PT - Cognitive impairments: No apparent impairments    Following commands: Intact       Cueing Cueing Techniques: Verbal cues      PT Assessment Patient needs continued PT services  PT Problem List Cardiopulmonary status limiting activity;Decreased activity tolerance;Decreased balance;Decreased mobility       PT Treatment Interventions DME instruction;Gait training;Stair training;Functional mobility training;Therapeutic activities;Therapeutic exercise;Balance training;Patient/family education    PT Goals (Current goals can be found in the Care Plan section)  Acute Rehab PT Goals Patient Stated Goal: to go back home PT Goal Formulation: With patient Time For Goal Achievement: 11/16/23 Potential to Achieve Goals: Good    Frequency Min 2X/week        AM-PAC PT 6 Clicks Mobility  Outcome Measure Help needed turning from your back to your side while in a flat bed without using bedrails?: None Help needed moving from lying on your back to sitting on the side of a flat bed without using bedrails?: A Little Help needed moving to and from a bed to a chair (including a wheelchair)?: A Little Help needed  standing up from a chair using your arms (e.g., wheelchair or bedside chair)?: A Little Help needed to walk in hospital room?: A Little Help needed climbing 3-5 steps with a railing? : A Little 6 Click Score: 19    End of Session   Activity Tolerance: Patient limited by fatigue Patient left: in chair;with call bell/phone within reach Nurse Communication: Mobility status PT Visit Diagnosis: Other abnormalities of gait and mobility (R26.89);Muscle weakness (generalized) (M62.81)    Time: 8388-8365 PT Time Calculation (min) (ACUTE ONLY): 23 min   Charges:   PT Evaluation $PT Eval Low Complexity: 1 Low   PT General Charges $$ ACUTE PT VISIT: 1 Visit        Kate ORN, PT, DPT Secure Chat Preferred  Rehab Office [phone]   Kate BRAVO Wendolyn 11/02/2023, 4:48 PM

## 2023-11-02 NOTE — Progress Notes (Addendum)
 HEART AND VASCULAR CENTER   MULTIDISCIPLINARY HEART VALVE TEAM  Patient Name: Luke Harvey Date of Encounter: 11/02/2023  Admit date: 10/29/2023  Primary Care Provider: [facility] HeartCare Cardiologist: Lonni LITTIE Nanas, MD  [facility] HeartCare Electrophysiologist:  None   [facility] Problem List     Principal Problem:   Heart failure [facility]) Active Problems:   Nonrheumatic aortic (valve) stenosis     Subjective   Breathing attacks have improved. Sitting up eating breakfast.   Inpatient Medications    Scheduled Meds:  aspirin  EC  81 mg Oral Q1200   clopidogrel   75 mg Oral Daily   DULoxetine   30 mg Oral QHS   heparin   5,000 Units Subcutaneous Q8H   insulin  aspart  0-5 Units Subcutaneous QHS   insulin  aspart  0-9 Units Subcutaneous TID WC   isosorbide  mononitrate  30 mg Oral Daily   levothyroxine   75 mcg Oral Q0600   loratadine   10 mg Oral Q1200   metoprolol  succinate  25 mg Oral BID   rosuvastatin   40 mg Oral Daily   sodium chloride  flush  3 mL Intravenous Q12H   spironolactone   12.5 mg Oral Daily   Continuous Infusions:  PRN Meds: acetaminophen , artificial tears, fluticasone , ondansetron  (ZOFRAN ) IV, sodium chloride  flush   Vital Signs    Vitals:   11/01/23 2002 11/02/23 0052 11/02/23 0412 11/02/23 0809  BP: 105/75   (!) 95/53  Pulse: 99 91 87   Resp:    20  Temp: 97.6 F (36.4 C) 97.6 F (36.4 C) 98.7 F (37.1 C)   TempSrc: Oral Oral Oral Oral  SpO2: 100% 92% 98%   Weight:   61.7 kg   Height:        Intake/Output Summary (Last 24 hours) at 11/02/2023 0854 Last data filed at 11/02/2023 0840 Gross per 24 hour  Intake 237 ml  Output 1575 ml  Net -1338 ml   Filed Weights   10/31/23 0323 11/01/23 0413 11/02/23 0412  Weight: 63 kg 63.4 kg 61.7 kg    Physical Exam    GEN: Well nourished, well developed, in no acute distress.  HEENT: Grossly normal.  Neck: Supple, no JVD, carotid bruits, or masses. Cardiac: RRR, 1/6 systolic  murmur heard best at LUSB. No rubs, or gallops Respiratory:  Decreased breath sounds at bases and right lung.  GI: Soft, nontender, nondistended, BS + x 4. MS: no deformity or atrophy. Skin: warm and dry, no rash. Neuro:  Strength and sensation are intact. Psych: AAOx3.  Normal affect.  Labs    CBC Recent Labs    11/01/23 0303 11/02/23 0303  WBC 9.2 9.1  HGB 11.8* 11.6*  HCT 35.5* 34.3*  MCV 86.4 84.1  PLT 398 373   Basic Metabolic Panel Recent Labs    92/91/74 0303 11/01/23 1123 11/02/23 0303  NA 136  --  136  K 3.9  --  3.8  CL 98  --  97*  CO2 25  --  25  GLUCOSE 171*  --  135*  BUN 28*  --  30*  CREATININE 1.40*  --  1.49*  CALCIUM  9.0  --  9.0  MG  --  2.4  --    Telemetry    Sinus with two runs of AT/ SVT - Personally Reviewed  ECG    Sinus, IVCD, HR 86 - Personally Reviewed  Radiology    DG CHEST PORT 1 VIEW Result Date: 10/31/2023 CLINICAL DATA:  Breathing issues for 2  weeks EXAM: PORTABLE CHEST 1 VIEW COMPARISON:  10/29/2023, 03/29/2022 FINDINGS: Sternotomy and coronary stents. Mild cardiomegaly with vascular congestion and diffuse interstitial edema. Small right greater than left pleural effusions. No pneumothorax. IMPRESSION: Cardiomegaly with vascular congestion and diffuse interstitial edema. Small right greater than left pleural effusions. These findings do not appear significantly worsened compared with 10/29/2023 Electronically Signed   By: Luke Bun M.D.   On: 10/31/2023 17:57    Patient Profile     Luke Harvey is a 78 y.o. male with a hx of CAD s/p CABG x2V (2014), multiple PCI's, CKD stage IIIa, ICM w/ EF of 25 to 30%, HLD, T2DM, and LFLG AS who is being seen today for the evaluation of severe AS at the request of Dr. Debera.   Assessment & Plan    Severe LFLG AS: -- Plan TAVR work up.  -- CT scans today.   Multivessel CAD: -- Status post CABG in 2014 with LIMA to LAD and SVG to circumflex/OM as well as DES to RCA.   Subsequently underwent PCI to SVG to OM in 2023 and just recently DES x 2 to SVG to OM on June 30 in the setting of NSTEMI.  LIMA to LAD patent with otherwise severe underlying native CAD.   -- High-sensitivity troponin I levels remain elevated in the 200s, but in flat pattern not suggestive of ACS.  -- Continue Aspirin  81mg  daily.  -- Brilinta  discontinued to see if this helped with SOB.  -- Loaded with Plavix  300mg  x1 and started on Plavix  75mg  daily.   -- Hold imdur  30mg  daily given symptomatic hypotension.  -- Continue Crestor  40mg  daily.  -- No chest pain.    Acute HFrEF: -- EF 35%. -- CXR with pulm vasc congestion and pleural effusions.  -- Treated with IV Lasix  40mg  BID. Seen by advanced CHF yesterday who gave him one dose of IV Lasix  80mg . -- Net negative 3.5L with big improvement in breathing. Will hold diuretics today for CT scans and then restart either PO/IV tomorrow based on imaging and clinical status. -- Hold Toprol  XL 25mg  BID and spironolactone  12.5mg  daily as BPs is soft and pt reported weakness and dizziness. -- Holding home Jardiance  with possible inpatient TAVR.    CKD stage IIIa: -- Creat stable at 1.49. -- Continue to monitor.   DMT2: -- Continue SSI   Atrial tachycardia: -- Seem to correlate with SOB attacks. -- Toprol  XL increased from 25mg  daily to 25mg  BID. However, currently holding it with symptomatic hypotension. Will reduce Toprol  back to 25mg  daily and give it at bedtime.  Pleural effusions: -- CTA showed moderate to large right and moderate left layering pleural effusions with associated atelectasis. -- Will consult IR to see if they can tap right side.   [name], PA-C  11/02/2023, 8:54 AM  Pager [phone]  Patient seen, examined. Available data reviewed. Agree with findings, assessment, and plan as outlined by Izetta Hummer, PA-C.  The patient is independently interviewed and examined.  He is feeling better today with fewer  dyspneic episodes.  On my exam, he is alert, oriented, elderly appearing in no distress.  JVP is normal, lungs with diminished breath sounds in the bases otherwise clear, heart is regular rate and rhythm with a 2/6 systolic murmur at the right upper sternal border, abdomen is soft and nontender, no masses.  Extremities have no edema.  Skin is warm and dry with no rash.  The patient is clinically improved with further  IV diuresis and discontinuation of ticagrelor  which may have been contributing to his dyspneic spells.  He has undergone CT angiography studies for TAVR planning and the results of the cardiac CTA are currently pending. The patient has a moderate right pleural effusion on CT study.  Will place a consultation to interventional radiology to see if he could be a candidate for thoracentesis as a way to improve his shortness of breath.  Reviewed our plans with the patient and I called his daughter and spoke with her over the telephone.  Dr. Shyrl will see him once his CTA studies are interpreted.  We tentatively anticipate moving forward with TAVR on Friday morning if his anatomy is amenable.  Otherwise, as outlined above.  [name], M.D. 11/02/2023 1:03 PM

## 2023-11-02 NOTE — TOC Initial Note (Addendum)
 Transition of Care [facility]) - Initial/Assessment Note    Patient Details  Name: Luke Harvey MRN: [id number] Date of Birth: 06-17-1945  [facility] CM/SW Contact:    Waddell Barnie Rama, RN Phone Number: 11/02/2023, 12:41 PM  Clinical Narrative:                 From home alone, has PCP and insurance on file, states has no HH services in place at this time , has a cane at home.  States daughter  will transport them home at [employer] and she is support system, states gets medications from [facility].  Pta self ambulatory with cane.  NCM notified April with [facility] . Patient will benefit from pt eval.    Expected Discharge Plan: Home w Home Health Services Barriers to Discharge: Continued Medical Work up   Patient Goals and CMS Choice Patient states their goals for this hospitalization and ongoing recovery are:: return home   Choice offered to / list presented to : NA      Expected Discharge Plan and Services In-house Referral: NA Discharge Planning Services: CM Consult Post Acute Care Choice: NA Living arrangements for the past 2 months: Single Family Home                   DME Agency: NA       HH Arranged: NA          Prior Living Arrangements/Services Living arrangements for the past 2 months: Single Family Home Lives with:: Self Patient language and need for interpreter reviewed:: Yes Do you feel safe going back to the place where you live?: Yes      Need for Family Participation in Patient Care: Yes (Comment) Care giver support system in place?: Yes (comment) Current home services: DME (cane) Criminal Activity/Legal Involvement Pertinent to Current Situation/Hospitalization: No - Comment as needed  Activities of Daily Living   ADL Screening (condition at time of admission) Independently performs ADLs?: Yes (appropriate for developmental age) Is the patient deaf or have difficulty hearing?: No Does the patient have difficulty seeing, even  when wearing glasses/contacts?: No Does the patient have difficulty concentrating, remembering, or making decisions?: No  Permission Sought/Granted Permission sought to share information with : Case Manager, Family Supports Permission granted to share information with : Yes, Verbal Permission Granted              Emotional Assessment Appearance:: Appears stated age Attitude/Demeanor/Rapport: Engaged Affect (typically observed): Appropriate Orientation: : Oriented to Self, Oriented to Place, Oriented to  Time, Oriented to Situation Alcohol  / Substance Use: Not Applicable Psych Involvement: No (comment)  Admission diagnosis:  ACS (acute coronary syndrome) (HCC) [I24.9] Heart failure (HCC) [I50.9] Congestive heart failure, unspecified HF chronicity, unspecified heart failure type (HCC) [I50.9] Patient Active Problem List   Diagnosis Date Noted   Heart failure (HCC) 10/29/2023   Acute systolic heart failure (HCC) 10/25/2023   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 06/09/2016   NSTEMI (non-ST elevated myocardial infarction) (HCC) 06/08/2016   PVD (peripheral vascular disease) (HCC)    Diabetes mellitus (HCC)    Coronary artery disease with hx CABG    HLD (hyperlipidemia)  PCP:  Clinic, Bonni [facility] Pharmacy:   [facility] - [place], [place] - [address] [place] [address] Phone: [phone] Fax: [phone]  [id number] [place], [place] - [address] AT [facility] [address] [place] [address] Phone: [phone]  Fax: [phone]  [facility] [address] [place] [address] Phone: [phone] Fax: [phone]     Social Drivers of Health (SDOH) Social History: SDOH Screenings   Food Insecurity: No Food Insecurity (10/29/2023)  Housing: Low Risk  (10/29/2023)  Transportation Needs: No Transportation Needs (10/29/2023)  Utilities: Not At Risk (10/29/2023)  Depression (PHQ2-9): Low Risk  (07/16/2020)  Social Connections: Unknown (10/30/2023)  Tobacco Use: Medium Risk (10/29/2023)   SDOH Interventions:     Readmission Risk Interventions    11/02/2023   12:38 PM  Readmission Risk Prevention Plan  Transportation Screening Complete  PCP or Specialist Appt within 3-5 Days Complete  HRI or Home Care Consult Complete  Palliative Care Screening Not Applicable  Medication Review (RN Care Manager) Complete

## 2023-11-02 NOTE — Procedures (Signed)
 PROCEDURE SUMMARY:  Successful US  guided right thoracentesis. Yielded 800 mL of yellow fluid. Pt tolerated procedure well. No immediate complications.  Specimen was not sent for labs. CXR ordered.  EBL < 5 mL  Solmon Selmer Ku PA-C 11/02/2023 1:59 PM

## 2023-11-02 NOTE — Consult Note (Cosign Needed Addendum)
 HEART AND VASCULAR CENTER   MULTIDISCIPLINARY HEART VALVE TEAM  Cardiology Consultation:   Patient ID: Luke Harvey MRN: [id number]; DOB: 1946/01/25  Admit date: 10/29/2023 Date of Consult: 11/02/2023  Primary Care Provider: Clinic, [facility] Cardiologist: Lonni LITTIE Nanas, MD  [facility] Electrophysiologist:  None    Patient Profile:   Luke Harvey is a 78 y.o. male with a hx of  CAD s/p CABG x2V (2014), multiple PCI's, CKD stage IIIa, ICM w/ EF of 25 to 30%, HLD, T2DM, and LFLG AS who is being seen today for the evaluation of severe aortic stenosis at the request of Dr. Wonda.  History of Present Illness:   Luke Harvey lives in [place] .  He lives alone and takes care of his own ADLs.  He continues to drive.  [name], Luke Harvey, who lives in [place] and helps take care of him. He has good dental heath.   Underwent CABG in 2014 with LIMA to LAD and SVG to LCx. In 2015 he underwent stenting of the RCA.  In 2018, he underwent PCI/DES for ISR of RCA in the setting of NSTEMI. He was admitted 03/2020 with unstable angina. S/p DES to SVG--> OM x 3 and staged CSI orbital atherectomy shockwave IVUS lithotripsy to prox-mid RCA and shockwave lithotripsy and POBA to distal RCA. He had accelerating chest pain in 2023. Cath showed patent LIMA to LAD, SVG to OM with 50% ostial and 25% proximal in-stent restenosis. There was 95% stenosis at the proximal marginal of mid graft stent s/p PCI/DES x1 to SVG to OM overlapping previously placed stent. Plan for DAPT with ASA/Brilinta , ideally indefinitely.    He has been followed by the [facility] but was recent admitted to [facility] 6/29-10/26/23 for NSTEMI. Peak troponin 525. Cath showed severe native LCA disease with occcluded LCX and LAD, patent RCA stents, patent LIMA to LAD, SVG-OM had 70% stenosis s/p successful PCI of vein graft with 2 overlapping stents. Echo 10/25/23 showed LVEF 25-30%, LV RWMAs, G2DD,  mod-severe MAC with mild-mod MR/TR, moderate AI and severe LFLG AS: mean gradient 17.7 mmHg, V-max 2.81 m/s, [name] 0.93 cm, DVI 0.29, SVI 26.  The right and left aortic cusps were noted to be virtually immobile questioning fusion, while the posterior noncoronary cusp had near normal mobility. The regional wall motion abnormalities were not felt to correlate to his angiographic findings and felt to possibly indicate aortic stenosis as the cause of his cardiomyopathy. Outpatient TAVR consult was recommended and set up for 7/23.   He presented back to [facility] on 10/29/2023 with worsening exertional shortness of breath.  BNP elevated ~1200, hstrop 258-->255-->298. CXR with pulmonary edema and small to moderate right pleural effusion. He reported severe attacks of shortness of breath that correlated with runs of atrial tachycardia. He has been diuresed 3.8L. BP meds held given symptomatic hypotension.   Cardiac gated CTA of the heart revealed anatomical characteristics consistent with aortic stenosis suitable for treatment by transcatheter aortic valve replacement. 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 Left main height noted to be low at 9mm but pt has an functioning LIMA --> LAD. CTA of the aorta and iliac vessels demonstrated what appears to be adequate pelvic vascular access to facilitate a right transfemoral approach. However, there was extensive mixed calcified and fibrofatty atherosclerotic plaque throughout the thoracoabdominal aorta without evidence of aneurysm  or dissection. Scan also showed mod-large right pleural effusion s/p thoracentesis yielding 800cc fluid. Brilinta  was converted to Plavix  in the case that it was contributing to his episodic dyspnea.       Past Medical History:  Diagnosis Date   Age-related macular degeneration, dry, both eyes    Anxiety    Aortic stenosis     Arthritis    probably all over (06/08/2016)   Cardiomyopathy (HCC)    a. EF 40-45% in 2018, normalized in 03/2020.   Chronic back pain    started in my lower back; going up my back in the last couple months (06/08/2016)   Chronic sinusitis    Coronary artery disease    a. s/p CABGx 2V (2014 in [place])  b. PCI (2015 in [place]). c. PCI 2018 ([facility]). d. PCI 03/2020 (complex - [facility]).   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 06/09/2016   NSTEMI (non-ST elevated myocardial infarction) (HCC) 06/08/2016   Pneumonia    3-4 times maybe (06/08/2016)   PVD (peripheral vascular disease) (HCC)    Recovering alcoholic (HCC)    picked up my 30 year chip the other day (06/08/2016)   S/P angioplasty with stent, 06/08/16 DES, for in-stent restenosis in RCA. 06/09/2016   Sleep apnea    got a mask after OR; don't use it (06/08/2016)   Type II diabetes mellitus (HCC)     Past Surgical History:  Procedure Laterality Date   ANTERIOR CERVICAL DECOMP/DISCECTOMY FUSION     BACK SURGERY     CARDIAC CATHETERIZATION  03/2012   led to bypass   CAROTID ENDARTERECTOMY Right ~ 2014   COLONOSCOPY W/ BIOPSIES AND POLYPECTOMY     CORONARY ANGIOPLASTY WITH STENT PLACEMENT  2015; 06/08/2016   CORONARY ARTERY BYPASS GRAFT  04/10/2012   CORONARY ATHERECTOMY N/A 04/04/2020   Procedure: CORONARY ATHERECTOMY;  Surgeon: Burnard Debby LABOR, MD;  Location: [facility] INVASIVE CV LAB;  Service: Cardiovascular;  Laterality: N/A;   CORONARY STENT INTERVENTION N/A 06/08/2016   Procedure: Coronary Stent Intervention;  Surgeon: Debby LABOR Burnard, MD;  Location: [facility] INVASIVE  CV LAB;  Service: Cardiovascular;  Laterality: N/A;   CORONARY STENT INTERVENTION N/A 04/02/2020   Procedure: CORONARY STENT INTERVENTION;  Surgeon: Anner Alm ORN, MD;  Location: [facility] INVASIVE CV LAB;  Service: Cardiovascular;  Laterality: N/A;   CORONARY STENT INTERVENTION N/A 04/04/2020   Procedure: CORONARY STENT INTERVENTION;  Surgeon: Burnard Debby LABOR, MD;  Location: [facility] INVASIVE CV LAB;  Service: Cardiovascular;  Laterality: N/A;   CORONARY STENT INTERVENTION N/A 11/16/2021   Procedure: CORONARY STENT INTERVENTION;  Surgeon: Mady Bruckner, MD;  Location: [facility] INVASIVE CV LAB;  Service: Cardiovascular;  Laterality: N/A;   CORONARY STENT INTERVENTION N/A 10/24/2023   Procedure: CORONARY STENT INTERVENTION;  Surgeon: Anner Alm ORN, MD;  Location: [facility] INVASIVE CV LAB;  Service: Cardiovascular;  Laterality: N/A;   CORONARY ULTRASOUND/IVUS N/A 04/04/2020   Procedure: Intravascular Ultrasound/IVUS;  Surgeon: Burnard Debby LABOR, MD;  Location: [facility] INVASIVE CV LAB;  Service: Cardiovascular;  Laterality: N/A;   IR  THORACENTESIS ASP PLEURAL SPACE W/IMG GUIDE  11/02/2023   JOINT REPLACEMENT     LEFT HEART CATH AND CORONARY ANGIOGRAPHY N/A 06/08/2016   Procedure: Left Heart Cath and Coronary Angiography;  Surgeon: Debby DELENA Sor, MD;  Location: [facility] INVASIVE CV LAB;  Service: Cardiovascular;  Laterality: N/A;   LEFT HEART CATH AND CORS/GRAFTS ANGIOGRAPHY N/A 04/02/2020   Procedure: LEFT HEART CATH AND CORS/GRAFTS ANGIOGRAPHY;  Surgeon: Anner Alm ORN, MD;  Location: [facility] INVASIVE CV LAB;  Service: Cardiovascular;  Laterality: N/A;   LEFT HEART CATH AND CORS/GRAFTS ANGIOGRAPHY N/A 11/16/2021   Procedure: LEFT HEART CATH AND CORS/GRAFTS ANGIOGRAPHY;  Surgeon: Mady Bruckner, MD;  Location: [facility] INVASIVE CV LAB;  Service: Cardiovascular;  Laterality: N/A;   REVISION TOTAL HIP ARTHROPLASTY Left ~ 2004   RIGHT/LEFT HEART CATH AND CORONARY ANGIOGRAPHY N/A 10/24/2023   Procedure: RIGHT/LEFT HEART CATH AND CORONARY  ANGIOGRAPHY;  Surgeon: Anner Alm ORN, MD;  Location: [facility] INVASIVE CV LAB;  Service: Cardiovascular;  Laterality: N/A;   TEMPORARY PACEMAKER N/A 04/04/2020   Procedure: TEMPORARY PACEMAKER;  Surgeon: Sor Debby DELENA, MD;  Location: [facility] INVASIVE CV LAB;  Service: Cardiovascular;  Laterality: N/A;   TONSILLECTOMY     TOTAL HIP ARTHROPLASTY Left 1990s     Home Medications:  Prior to Admission medications   Medication Sig Start Date End Date Taking? Authorizing Provider  acetaminophen  (TYLENOL ) 500 MG tablet Take 500 mg by mouth 2 (two) times daily.   Yes [provider]  APPLE CIDER VINEGAR PO Take 1 capsule by mouth daily at 12 noon.   Yes [provider]  aspirin  EC 81 MG tablet Take 81 mg by mouth daily at 12 noon.   Yes [provider]  BIOTIN PO Take 1 tablet by mouth daily at 12 noon.   Yes [provider]  carboxymethylcellulose (REFRESH PLUS) 0.5 % SOLN Place 1 drop into both eyes See admin instructions. Administer 1 drop into each eye every daily. May use every 12 hours if needed for dry eyes.   Yes [provider]  Cholecalciferol (VITAMIN D-3 PO) Take 1 tablet by mouth daily at 12 noon.   Yes [provider]  Coenzyme Q10 (COQ10 PO) Take 1 capsule by mouth daily at 12 noon.   Yes [provider]  Cyanocobalamin (VITAMIN B12 PO) Take 1 tablet by mouth daily at 12 noon.   Yes [provider]  diclofenac Sodium (VOLTAREN) 1 % GEL 1 Application every 6 (six) hours as needed for pain. 02/02/22  Yes [provider]  DULoxetine  (CYMBALTA ) 30 MG capsule Take 30 mg by mouth at bedtime.   Yes [provider]  empagliflozin  (JARDIANCE ) 10 MG TABS tablet Take 1 tablet (10 mg total) by mouth daily. 10/27/23  Yes Amin, Ankit C, MD  fluticasone  (FLONASE ) 50 MCG/ACT nasal spray Place 1-2 sprays into both nostrils daily as needed for allergies or rhinitis.   Yes [provider]  isosorbide  mononitrate  (IMDUR ) 30 MG 24 hr tablet Take 60 mg by mouth daily at 12 noon.   Yes [provider]  ketoconazole (NIZORAL) 2 % cream Apply 1 Application topically 2 (two) times daily as needed for irritation. 02/23/21  Yes [provider]  ketoconazole (NIZORAL) 2 % shampoo Apply 1 Application topically every 3 (three) days.   Yes [provider]  levOCARNitine (L-CARNITINE PO) Take 2 tablets by mouth daily at 12 noon.   Yes [provider]  levothyroxine  (SYNTHROID ) 75 MCG tablet Take  75 mcg by mouth daily before breakfast. 02/02/22  Yes [provider]  lidocaine  (LIDODERM ) 5 % Place 1 patch onto the skin daily as needed (pain).   Yes [provider]  loratadine  (CLARITIN ) 10 MG tablet Take 10 mg by mouth daily at 12 noon.   Yes [provider]  MAGNESIUM  PO Take 2 tablets by mouth daily at 12 noon.   Yes [provider]  metFORMIN  (GLUCOPHAGE ) 500 MG tablet Take 1,000 mg by mouth 2 (two) times daily as needed (BS > 140). 05/04/06  Yes [provider]  metoprolol  succinate (TOPROL -XL) 50 MG 24 hr tablet Take 1 tablet (50 mg total) by mouth daily. Take with or immediately following a meal. 10/27/23  Yes Amin, Ankit C, MD  MILK THISTLE PO Take 1 tablet by mouth daily at 12 noon.   Yes [provider]  Misc Natural Products (TURMERIC, CURCUMIN, PO) Take 1 tablet by mouth daily at 12 noon.   Yes [provider]  Multiple Vitamins-Minerals (PRESERVISION AREDS 2) CAPS Take 1 capsule by mouth 2 (two) times daily.   Yes [provider]  nitroGLYCERIN  (NITROSTAT ) 0.4 MG SL tablet Place 1 tablet (0.4 mg total) under the tongue See admin instructions. Take 1 tablet (0.4) mg by mouth every day. May take an additional 1 tablet later in the day if  chest pain. 10/26/23  Yes Amin, Ankit C, MD  OVER THE COUNTER MEDICATION Take 1 capsule by mouth daily at 12 noon. VisiUltra   Yes [provider]  OVER THE COUNTER  MEDICATION Take 1 capsule by mouth daily at 12 noon. blood boost supplement for glucose control   Yes [provider]  Probiotic Product (PROBIOTIC PO) Take 2 capsules by mouth daily at 12 noon.   Yes [provider]  QUERCETIN PO Take 1 tablet by mouth daily at 12 noon.   Yes [provider]  rosuvastatin  (CRESTOR ) 40 MG tablet Take 1 tablet (40 mg total) by mouth daily. Patient taking differently: Take 40 mg by mouth at bedtime. 04/05/20  Yes Dunn, Dayna N, PA-C  Saw Palmetto, Serenoa repens, (SAW PALMETTO PO) Take 1 tablet by mouth daily at 12 noon.   Yes [provider]  SELENIUM PO Take 1 tablet by mouth daily at 12 noon.   Yes [provider]  spironolactone  (ALDACTONE ) 25 MG tablet Take 0.5 tablets (12.5 mg total) by mouth daily. 10/26/23  Yes Amin, Ankit C, MD  ticagrelor  (BRILINTA ) 90 MG TABS tablet Take 1 tablet (90 mg total) by mouth 2 (two) times daily. 04/05/20  Yes Dunn, Dayna N, PA-C    Inpatient Medications: Scheduled Meds:  aspirin  EC  81 mg Oral Q1200   clopidogrel   75 mg Oral Daily   DULoxetine   30 mg Oral QHS   heparin   5,000 Units Subcutaneous Q8H   insulin  aspart  0-5 Units Subcutaneous QHS   insulin  aspart  0-9 Units Subcutaneous TID WC   levothyroxine   75 mcg Oral Q0600   loratadine   10 mg Oral Q1200   [START ON 11/03/2023] metoprolol  succinate  25 mg Oral QHS   rosuvastatin   40 mg Oral Daily   sodium chloride  flush  3 mL Intravenous Q12H   Continuous Infusions:  PRN Meds: acetaminophen , artificial tears, fluticasone , ondansetron  (ZOFRAN ) IV, sodium chloride  flush  Allergies:    Allergies  Allergen Reactions   Tetracyclines & Related Hives and Itching   Cozaar [Losartan] Cough   Zestril  [Lisinopril ] Cough  Lipitor [Atorvastatin] Other (See Comments)    Myalgias     Social History:   Social History   Socioeconomic History   Marital status: Divorced    Spouse name: Not on file   Number of children: 2    Years of education: Not on file   Highest education level: GED or equivalent  Occupational History   Occupation: Retired  Tobacco Use   Smoking status: Former    Current packs/day: 0.00    Average packs/day: 1 pack/day for 40.0 years (40.0 ttl pk-yrs)    Types: Cigarettes    Start date: 07/17/1968    Quit date: 07/17/2008    Years since quitting: 15.3   Smokeless tobacco: Never  Substance and Sexual Activity   Alcohol  use: No    Comment: recovering alcoholic, abstinence 30 years.  attends AA weekly   Drug use: No   Sexual activity: Never  Other Topics Concern   Not on file  Social History Narrative   Not on file   Social Drivers of Health   Financial Resource Strain: Not on file  Food Insecurity: No Food Insecurity (10/29/2023)   Hunger Vital Sign    Worried About Running Out of Food in the Last Year: Never true    Ran Out of Food in the Last Year: Never true  Transportation Needs: No Transportation Needs (10/29/2023)   PRAPARE - Administrator, Civil Service (Medical): No    Lack of Transportation (Non-Medical): No  Physical Activity: Not on file  Stress: Not on file  Social Connections: Unknown (10/30/2023)   Social Connection and Isolation Panel    Frequency of Communication with Friends and Family: Never    Frequency of Social Gatherings with Friends and Family: Never    Attends Religious Services: Never    Database administrator or Organizations: Patient declined    Attends Banker Meetings: Patient declined    Marital Status: Not on file  Intimate Partner Violence: Not At Risk (10/29/2023)   Humiliation, Afraid, Rape, and Kick questionnaire    Fear of Current or Ex-Partner: No    Emotionally Abused: No    Physically Abused: No    Sexually Abused: No    Family History:   Family History  Problem Relation Age of Onset   Cancer Mother      ROS:  Please see the history of present illness.  All other ROS reviewed and negative.     Physical  Exam/Data:   Vitals:   11/02/23 0809 11/02/23 0937 11/02/23 1144 11/02/23 1428  BP: (!) 95/53 90/60 113/66 (!) 100/59  Pulse:   86   Resp: 20  18   Temp:   97.7 F (36.5 C)   TempSrc: Oral  Oral   SpO2:   94%   Weight:      Height:        Intake/Output Summary (Last 24 hours) at 11/02/2023 1526 Last data filed at 11/02/2023 0952 Gross per 24 hour  Intake --  Output 1500 ml  Net -1500 ml      11/02/2023    4:12 AM 11/01/2023    4:13 AM 10/31/2023    3:23 AM  Last 3 Weights  Weight (lbs) 136 lb 0.4 oz 139 lb 12.4 oz 138 lb 12.8 oz  Weight (kg) 61.7 kg 63.4 kg 62.959 kg     Body mass index is 21.63 kg/m.  General:  Thin, frail appearing white male  HEENT: normal Neck: no JVD  Endocrine:  No thryomegaly Cardiac:  normal S1, S2; RRR; barely audible systolic murmur Lungs: diminished breath sounds  Abd: soft, nontender, no hepatomegaly  Ext: no edema Musculoskeletal:  No deformities, BUE and BLE strength normal and equal Skin: warm and dry  Neuro:  CNs 2-12 intact, no focal abnormalities noted Psych:  Normal affect    EKG:  The EKG was personally reviewed and demonstrates:  sinus with IVCD, HR 89 Telemetry:  Telemetry was personally reviewed and demonstrates:  sinus with runs of atrial tachycardia   Cardiac Studies & Procedures   ______________________________________________________________________________________________ CARDIAC CATHETERIZATION  CARDIAC CATHETERIZATION 10/24/2023  Conclusion Images from the original result were not included.    Mid LM to Prox LAD lesion is 95% stenosed with 90% stenosed side branch in Ost Cx to Prox Cx.   Prox LAD to Mid LAD lesion is 100% stenosed with 99% stenosed side branch in 2nd Diag.   Previously placed mid RCA to Dist RCA DES stent is 30% stenosed.  Previously placed Prox RCA DES stent of is widely patent.   LIMA-LAD and is very large, and existing no disease.  Retrograde flow to the LAD fills a diagonal branch and antegrade flow  reaches the apex.  No competitive flow.   2nd Mrg lesion is 100% stenosed.   Prox Graft to Mid Graft lesion is 90% stenosed.  (Lesion #3)   A drug-eluting stent was successfully placed, overlapping with previous distal stent and covering the lesion, using a STENT SYNERGY XD 3.0X32.  Deployed to 3.3 mm with stent balloon.   Post intervention, there is a 0% residual stenosis.   Mid Graft lesion is 80% stenosed (lesion #4.)-Focal in-stent restenosis but otherwise the stent is widely patent.  Scoring balloon angioplasty was performed using a BALLOON SCOREFLEX 3.0X10.  Post intervention, there is a 10% residual stenosis.   Dist Graft lesion is 45% stenosed.   Prox Graft-2 lesion is 60% stenosed.  Prox Graft-1 lesion is 25% stenosed.   A drug-eluting stent was successfully placed overlapping the previous ostial and proximal stent and the newly placed stent, using a STENT SYNERGY XD 3.0X24.  Deployed to 3.3 mm. Post intervention, there is a 0% residual stenosis.   Postintervention, there is TIMI-3 flow maintained throughout the SVG-OM 2 graft   ---------------------------------------   Hemodynamic findings consistent with mild pulmonary hypertension.   There is moderate aortic valve stenosis.  Mean gradient estimated 24.3 mmHg.  Trivial from estimated based on right heart cath numbers  Diagnostic  Dominance: Right      Intervention  POST-CATH DIAGNOSES Severe Native Left Coronary Artery Disease with essentially occluded LCx and LAD, very small caliber ramus and OM1 remain within 90% distal LM stenosis. Patent RCA stents with mild stenosis between the stents but otherwise stable flow. Patent LIMA to LAD SVG-OM has ostial 70% stenosis, mid graft there is tandem 60 and 90% stenosis as well as 80% ISR in the proximal edge of the more distal stent. - Successful score flex angioplasty of the ISR and PTCA-DES PCI of the vein graft connecting the 2 previous stent using 2 overlapping Synergy Stents (3.0 mm x 32  Owen 3.0 mm x 24 mm with all stents deployed to roughly 3.3 mm using stent balloon for overlap post dilation all the way to the ostium.  Right Heart Cath: RAP mean 12 mmHg, RVEDP-EDP 58/10-15 mmHg; PAP-mean 43/19-29 mmHg, PCWP 17 mmHg; LV P-EDP 162/1-23 mmHg, AO P-MAP 126/50-81 mmHg. AoV: PSV gradient 38 mm 3 with a  mean of 24 mmHg. (264 ms)-moderate stenosis. Ao sat 98%, PA sat 61%. Fick Cardiac Output-Index 3.76-2.17-moderately reduced.   RECOMMENDATIONS   Anticipated discharge date to be determined.   Patient will need to have echocardiogram completed and reviewed to reassess aortic valve. Titrate GDMT based on echo results and CAD.   Recommend uninterrupted dual antiplatelet therapy with Aspirin  81mg  daily and Ticagrelor  90mg  twice daily for a minimum of 12 months (ACS-Class I recommendation).    Alm MICAEL Clay, MD, MS Alm Clay, M.D., M.S. Interventional Cardiologist [facility] Pager # [phone]  Findings Coronary Findings Diagnostic  Dominance: Right  Left Main Mid LM to Prox LAD lesion is 95% stenosed with 90% stenosed side branch in Ost Cx to Prox Cx.  Left Anterior Descending Prox LAD to Mid LAD lesion is 100% stenosed with 99% stenosed side branch in 2nd Diag.  Ramus Intermedius Vessel is small.  Left Circumflex  First Obtuse Marginal Branch Vessel is small in size.  Second Obtuse Marginal Branch 2nd Mrg lesion is 100% stenosed.  Right Coronary Artery Previously placed Prox RCA stent of unknown type is  widely patent. The lesion is located at the bend and eccentric. The lesion is calcified. Previously placed stent displays no restenosis. Mid RCA to Dist RCA lesion is 30% stenosed. The lesion is located proximal to the major branch. The lesion was previously treated using a drug eluting stent over 2 years ago. Previously placed stent displays restenosis.  Acute Marginal Branch Vessel is small in size.  Right Ventricular Branch Vessel is  small in size.  First Right Posterolateral Branch Vessel is small in size.  Second Right Posterolateral Branch Vessel is moderate in size.  LIMA LIMA Graft To Mid LAD LIMA and is very large.  The graft exhibits no disease.  Saphenous Graft To 2nd Mrg SVG graft was visualized by angiography and is large.  The graft exhibits severe focal disease. Origin to Prox Graft lesion is 70% stenosed. The lesion is concentric. The lesion was previously treated using a drug eluting stent over 2 years ago. Prox Graft-1 lesion is 25% stenosed. The lesion is eccentric. The lesion was previously treated . This did not appear to be as significant on initial imaging.->  Following ostial stent placement, but this appeared to be worse, therefore a second overlapping stent was placed to cover this lesion. Prox Graft-2 lesion is 60% stenosed. Vessel is not the culprit lesion. The lesion is segmental, eccentric and irregular. Prox Graft to Mid Graft lesion is 90% stenosed. Mid Graft lesion is 80% stenosed. Vessel is the culprit lesion. The lesion was previously treated using a drug eluting stent over 2 years ago. Focal ISR Previously placed stent displays restenosis. Dist Graft lesion is 45% stenosed. The lesion was previously treated .  Intervention  Origin to Prox Graft lesion (Saphenous Graft To 2nd Mrg) Angioplasty Scoring balloon angioplasty was performed using a BALLOON SCOREFLEX 3.0X10. Maximum pressure: 16 atm. Inflation time: 20 sec.  A second ballloon was used, using a standard  BALLOON SAPPHIRE 2.5X12. Maximum pressure:  14 atm. Inflation time:  20 sec. Post-Intervention Lesion Assessment The intervention was successful. Pre-interventional TIMI flow is 3. Post-intervention TIMI flow is 3. Treated lesion length:  16 mm. No complications occurred at this lesion. There is a 10% residual stenosis post intervention.  Prox Graft-1 lesion (Saphenous Graft To 2nd Mrg) Stent (Also treats lesions: Prox  Graft-2) Lesion length:  20 mm. CATH LAUNCHER 6FR AL1 guide catheter was inserted. Lesion crossed  with guidewire using a WIRE ASAHI PROWATER 180CM. Pre-stent angioplasty was performed using a BALLOON EMERGE MR 2.5X30. Maximum pressure:  12 atm. Inflation time: 20 sec. A drug-eluting stent was successfully placed using a STENT SYNERGY XD 3.0X24. Maximum pressure: 18 atm. Inflation time: 30 sec. Stent strut is well apposed. Deployed to 3.3 mm. Stent overlaps previously placed stent. Post-stent angioplasty was performed. Maximum pressure:  18 atm. Inflation time:  20 sec. Stent balloon used to post dilate all 4 overlapping stents. Total of 6 inflations with stent balloon for postdilation Post-Intervention Lesion Assessment The intervention was successful. Pre-interventional TIMI flow is 3. Post-intervention TIMI flow is 3. There is a 0% residual stenosis post intervention.  Prox Graft-2 lesion (Saphenous Graft To 2nd Mrg) Stent Lesion length:  20 mm. CATH LAUNCHER 6FR AL1 guide catheter was inserted. Lesion crossed with guidewire using a WIRE ASAHI PROWATER 180CM. Pre-stent angioplasty was performed using a BALLOON EMERGE MR 2.5X30. Maximum pressure:  12 atm. Inflation time: 20 sec. A drug-eluting stent was successfully placed using a STENT SYNERGY XD 3.0X24. Maximum pressure: 18 atm. Inflation time: 30 sec. Stent strut is well apposed. Stent overlaps previously placed stent. Post-stent angioplasty was performed. Maximum pressure:  18 atm. Inflation time:  20 sec. The same stent balloon was used to post dilate up and down the 2 new stents as well as the proximal and distal stents all the way to the ostium Stent (Also treats lesions: Prox Graft-1) See details in Prox Graft-1 lesion (Saphenous Graft To 2nd Mrg). Stent (Also treats lesions: Prox Graft to Mid Graft, and Mid Graft) Lesion length:  32 mm. CATH LAUNCHER 6FR AL1 guide catheter was inserted. Lesion crossed with guidewire using a WIRE ASAHI PROWATER  180CM. Pre-stent angioplasty was performed using a BALLOON SAPPHIRE 2.5X12. Maximum pressure:  12 atm. Inflation time: 20 sec.  A second angioplasty balloon was used, using a BALLOON EMERGE MR 2.5X30. Maximum pressure:  12 atm. Inflation time:  20 sec. A drug-eluting stent was successfully placed using a STENT SYNERGY XD 3.0X32. Maximum pressure: 16 atm. Inflation time: 30 sec. Stent strut is well apposed. Deployed 3.3 mm Stent overlaps previously placed stent. Post-stent angioplasty was performed. Maximum pressure:  18 atm. Inflation time:  20 sec. Stent balloon-high ATM Post-Intervention Lesion Assessment The intervention was successful. Pre-interventional TIMI flow is 3. Post-intervention TIMI flow is 3. Treated lesion length:  20 mm. No complications occurred at this lesion. There is a 0% residual stenosis post intervention.  Prox Graft to Mid Graft lesion (Saphenous Graft To 2nd Mrg) Stent (Also treats lesions: Prox Graft-2, and Mid Graft) See details in Prox Graft-2 lesion (Saphenous Graft To 2nd Mrg). Post-Intervention Lesion Assessment The intervention was successful. Pre-interventional TIMI flow is 3. Post-intervention TIMI flow is 3. Treated lesion length:  32 mm. No complications occurred at this lesion. There is a 0% residual stenosis post intervention.  Mid Graft lesion (Saphenous Graft To 2nd Mrg) Angioplasty Scoring balloon angioplasty was performed using a BALLOON SCOREFLEX 3.0X10. Maximum pressure: 16 atm. Inflation time: 20 sec.  A second ballloon was used, using a standard  BALLOON SAPPHIRE 2.5X12. Maximum pressure:  12 atm. Inflation time:  20 sec. Stent (Also treats lesions: Prox Graft-2, and Prox Graft to Mid Graft) See details in Prox Graft-2 lesion (Saphenous Graft To 2nd Mrg). Post-Intervention Lesion Assessment The intervention was successful. Pre-interventional TIMI flow is 3. Post-intervention TIMI flow is 3. Treated lesion length:  15 mm. There is a 10% residual  stenosis post  intervention.   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 Patent RCA stents with 30% in-stent restenosis in the mid/distal stent.  Proximal stent is widely patent. Widely patent LIMA-LAD. Patent SVG-OM with 50% ostial and 25% proximal in-stent restenosis.  There is a 95% stenosis at the proximal margin of the mid graft stent as well as a 40-50% lesion at the distal stent edge. Mildly elevated left ventricular filling pressure (LVEDP 18 mmHg) with normal systolic function (LVEF 55-65%). Probably moderate aortic valve stenosis (peak-to-peak gradient 20 mmHg). Successful PCI to 95% stenosis in mid portion of SVG-OM using Synergy 3.0 x 16 mm drug-eluting stent that overlaps the previously placed stent.  There is 0% residual stenosis with TIMI-3 flow following PCI.  Recommendations: Continue indefinite dual antiplatelet therapy with aspirin  and ticagrelor .  One could consider stopping aspirin  as soon as 3 months post-PCI if there is concern for high bleeding risk. Medical therapy of mild-moderate in-stent restenosis involving SVG-OM and mid/distal RCA. Aggressive secondary prevention of coronary artery disease. Anticipate same-day discharge if no post-PCI complications occur during 6 hour monitoring period.  Lonni Hanson, MD [facility]  Findings Coronary Findings Diagnostic  Dominance: Right  Left Main Mid LM to Prox LAD lesion is 95% stenosed with 90% stenosed side branch in Ost Cx to Prox Cx.  Left Anterior Descending Prox LAD to Mid LAD lesion is 100% stenosed with 99% stenosed side branch in 2nd Diag.  Ramus Intermedius Vessel is small.  Left Circumflex  First Obtuse Marginal Branch Vessel is small in size.  Second Obtuse Marginal Branch 2nd Mrg lesion is 90% stenosed.  Right  Coronary Artery Previously placed Prox RCA stent of unknown type is  widely patent. The lesion is located at the bend and eccentric. The lesion is calcified. Mid RCA to Dist RCA lesion is 30% stenosed. The lesion was previously treated using a bare metal stent and a drug eluting stent over 2 years ago.  Acute Marginal Branch Vessel is small in size.  Right Ventricular Branch Vessel is small in size.  First Right Posterolateral Branch Vessel is small in size.  Second Right Posterolateral Branch Vessel is moderate in size.  LIMA LIMA Graft To Mid LAD LIMA and is very large.  The graft exhibits no disease.  Saphenous Graft To 2nd Mrg SVG and is large. Origin to Prox Graft lesion is 50% stenosed. The lesion is concentric. The lesion was previously treated . Prox Graft lesion is 25% stenosed. The lesion is eccentric. The lesion was previously treated . This did not appear to be as significant on initial imaging.->  Following ostial stent placement, but this appeared to be worse, therefore a second overlapping stent was placed to cover this lesion. Mid Graft lesion is 95% stenosed. Vessel is the culprit lesion. The lesion was previously treated . Previously placed stent displays restenosis. Dist Graft lesion is 45% stenosed.  Intervention  Mid Graft lesion (Saphenous Graft To 2nd Mrg) Stent Lesion length:  15 mm. CATHETER LAUNCHER 6FR AL1 guide catheter was inserted. Lesion crossed with guidewire using a WIRE RUNTHROUGH .[id number]. Pre-stent angioplasty was performed using a BALLN SAPPHIRE 2.5X12. Maximum pressure:  6 atm. A drug-eluting stent was successfully placed using a SYNERGY XD 3.0X16. Maximum pressure: 16 atm. Stent overlaps previously placed stent. Post-stent angioplasty was not performed. Post-Intervention Lesion Assessment The intervention was successful. Embolic  protection device was not deployed. Pre-interventional TIMI flow is 2. Post-intervention TIMI flow is 3. No  complications occurred at this lesion. There is a 0% residual stenosis post intervention.     ECHOCARDIOGRAM  ECHOCARDIOGRAM COMPLETE 10/25/2023  Narrative ECHOCARDIOGRAM REPORT    Patient Name:   BETHANY CUMMING [facility] Date of Exam: 10/25/2023 Medical Rec #:  [id number]        Height:       66.0 in Accession #:    [id number]       Weight:       139.6 lb Date of Birth:  Mar 07, 1946        BSA:          1.716 m Patient Age:    77 years         BP:           125/90 mmHg Patient Gender: M                HR:           108 bpm. Exam Location:  Inpatient  Procedure: 2D Echo, Cardiac Doppler, Color Doppler and Intracardiac Opacification Agent (Both Spectral and Color Flow Doppler were utilized during procedure).  Indications:    Acute ischemic heart disease, unspecified I24.9 Aortic stenosis I35.0  History:        Patient has prior history of Echocardiogram examinations, most recent 02/16/2022. Previous Myocardial Infarction and Angina, Prior CABG, Aortic Valve Disease; Risk Factors:Dyslipidemia.  Sonographer:    Thea Norlander RCS Sonographer#2:  Juliene Rucks Referring Phys: [phone] DAVID W HARDING  IMPRESSIONS   1. There is no left ventricular thrombus (Definity  contrast used). Findings suggest infarction/scar versus severe resting ischemia in the mid-distal LAD artery distribution and ischemia/partial scar in the proximal LAD and in the entire right coronary artery distribution. Left ventricular ejection fraction, by estimation, is 25 to 30%. The left ventricle has moderate to severely decreased function. The left ventricle demonstrates regional wall motion abnormalities (see scoring diagram/findings for description). Left ventricular diastolic parameters are consistent with Grade II diastolic dysfunction (pseudonormalization). Elevated left atrial pressure. There is akinesis of the left ventricular, mid-apical anterolateral wall, anterior wall and anteroseptal wall. There is severe  hypokinesis of the left ventricular, basal anteroseptal wall and anterior wall. There is moderate hypokinesis of the left ventricular, basal-mid inferior wall and inferoseptal wall. 2. Right ventricular systolic function is normal. The right ventricular size is normal. There is normal pulmonary artery systolic pressure. The estimated right ventricular systolic pressure is 32.2 mmHg. 3. Left atrial size was moderately dilated. 4. The mitral valve is degenerative. Mild to moderate mitral valve regurgitation. No evidence of mitral stenosis. Moderate to severe mitral annular calcification. 5. Tricuspid valve regurgitation is mild to moderate. 6. The right and left aortic cusps are virtually immobile (fused?), while the posterior noncoronary cusp has almost normal mobility. Hemodynamics are consistet with moderate to severe low flow-low gradient aortic stenosis. The aortic valve has an indeterminant number of cusps. There is moderate calcification of the aortic valve. There is moderate thickening of the aortic valve. Aortic valve regurgitation is moderate. Moderate to severe aortic valve stenosis. Aortic valve mean gradient measures 17.7 mmHg. Aortic valve Vmax measures 2.81 m/s. Aortic valve acceleration time measures 95 msec. 7. The inferior vena cava is normal in size with greater than 50% respiratory variability, suggesting right atrial pressure of 3 mmHg.  Comparison(s): A prior study was performed on 2023 [facility]. Prior images unable to be directly viewed, comparison  made by report only. The left ventricular function is significantly worse. The left ventricular wall motion abnormalities are significantly worse. Aortic stenosis is worse and was underestimated on previous studies due to low stroke volume index.  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  FINDINGS Left Ventricle: There is no left ventricular thrombus (Definity  contrast used). Findings suggest infarction/scar versus severe resting ischemia in the mid-distal LAD artery distribution and ischemia/partial scar in the proximal LAD and in the entire right coronary artery distribution. Left ventricular ejection fraction, by estimation, is 25 to 30%. The left ventricle has moderate to severely decreased function. The left ventricle demonstrates regional wall motion abnormalities. Severe hypokinesis of the left ventricular, basal anteroseptal wall and anterior wall. Moderate hypokinesis of the left ventricular, basal-mid inferior wall and inferoseptal wall. Definity  contrast agent was given IV to delineate the left ventricular endocardial borders. The left ventricular internal cavity size was normal in size. There is no left ventricular hypertrophy. Left ventricular diastolic parameters are consistent with Grade II diastolic dysfunction (pseudonormalization). Elevated left atrial pressure.   LV Wall Scoring: The mid anteroseptal segment, apical lateral segment, mid anterolateral segment, apical anterior segment, apical inferior segment, and apex are akinetic. The anterior wall, inferior septum, inferior wall, and basal anteroseptal segment are hypokinetic. The posterior wall and basal anterolateral segment are normal.  Right Ventricle: The right ventricular size is normal. No increase in right ventricular wall thickness. Right ventricular systolic function is normal. There is normal pulmonary artery systolic pressure. The tricuspid regurgitant velocity is 2.70 m/s, and with an assumed right atrial pressure of 3 mmHg, the estimated right ventricular systolic pressure is 32.2 mmHg.  Left Atrium: Left atrial size was moderately dilated.  Right Atrium: Right atrial size was normal in size.  Pericardium: There is no evidence of pericardial  effusion.  Mitral Valve: The mitral valve is degenerative in appearance. Moderate to severe mitral annular calcification. Mild to moderate mitral valve regurgitation, with centrally-directed jet. No evidence of mitral valve stenosis.  Tricuspid Valve: The tricuspid valve is normal in structure. Tricuspid valve regurgitation is mild to moderate.  Aortic Valve: The right and left aortic cusps are virtually immobile (fused?), while the posterior noncoronary cusp has almost normal mobility. Hemodynamics are consistet with moderate to severe low flow-low gradient aortic stenosis. The aortic valve has an indeterminant number of cusps. There is moderate calcification of the aortic valve. There is moderate thickening of the aortic valve. Aortic valve regurgitation is moderate. Moderate to severe aortic stenosis is present. Aortic valve mean gradient measures 17.7 mmHg. Aortic valve peak gradient measures 31.7 mmHg. Aortic valve area, by VTI measures 0.91 cm.  Pulmonic Valve: The pulmonic valve was grossly normal. Pulmonic valve regurgitation is trivial. No evidence of pulmonic stenosis.  Aorta: The aortic root and ascending aorta are structurally normal, with no evidence of dilitation.  Venous: The inferior vena cava is normal in size with greater than 50% respiratory variability, suggesting right atrial pressure of 3 mmHg.  IAS/Shunts: No atrial level shunt detected by color flow Doppler.   LEFT VENTRICLE PLAX 2D LVIDd:         4.20 cm   Diastology LVIDs:         3.60 cm   LV e' medial:    5.44 cm/s LV PW:         1.10 cm  LV E/e' medial:  23.7 LV IVS:        1.00 cm   LV e' lateral:   7.94 cm/s LVOT diam:     2.00 cm   LV E/e' lateral: 16.2 LV SV:         44 LV SV Index:   26 LVOT Area:     3.14 cm   RIGHT VENTRICLE             IVC RV S prime:     12.90 cm/s  IVC diam: 1.60 cm TAPSE (M-mode): 1.8 cm  LEFT ATRIUM             Index        RIGHT ATRIUM           Index LA diam:         3.60 cm 2.10 cm/m   RA Area:     13.20 cm LA Vol (A2C):   63.5 ml 37.00 ml/m  RA Volume:   34.40 ml  20.04 ml/m LA Vol (A4C):   57.1 ml 33.27 ml/m LA Biplane Vol: 63.1 ml 36.76 ml/m AORTIC VALVE AV Area (Vmax):    0.93 cm AV Area (Vmean):   0.98 cm AV Area (VTI):     0.91 cm AV Vmax:           281.33 cm/s AV Vmean:          194.000 cm/s AV VTI:            0.486 m AV Peak Grad:      31.7 mmHg AV Mean Grad:      17.7 mmHg LVOT Vmax:         83.60 cm/s LVOT Vmean:        60.300 cm/s LVOT VTI:          0.141 m LVOT/AV VTI ratio: 0.29  AORTA Ao Root diam: 3.00 cm Ao Asc diam:  3.40 cm  MITRAL VALVE                TRICUSPID VALVE MV Area (PHT): 5.42 cm     TR Peak grad:   29.2 mmHg MV Decel Time: 140 msec     TR Vmax:        270.00 cm/s MV E velocity: 129.00 cm/s MV A velocity: 91.40 cm/s   SHUNTS MV E/A ratio:  1.41         Systemic VTI:  0.14 m Systemic Diam: 2.00 cm  Jerel Croitoru MD Electronically signed by Jerel Balding MD Signature Date/Time: 10/25/2023/10:03:35 AM    Final      CT SCANS  CT CORONARY MORPH W/CTA COR W/SCORE 11/02/2023  Addendum 11/02/2023  1:16 PM ADDENDUM REPORT: 11/02/2023 13:13  EXAM: OVER-READ INTERPRETATION CARDIAC CT CHEST  The following report is an over-read performed by radiologist Dr. Ryan Salvage of [facility] on 11/02/2023. This over-read does not include interpretation of cardiac or coronary anatomy or pathology. The cardiac interpretation by the cardiologist is attached or will be attached.  COMPARISON:  CT chest also from 11/02/2023  FINDINGS: Extracardiac Vascular: Atheromatous vascular calcification of the thoracic aorta and branch vessels.  Mediastinum: Numerous scattered upper normal sized mediastinal lymph nodes.  Lung: Stable appearance of interstitial accentuation with some faint patchy ground-glass opacities in the lung apices favoring low-level edema or alveolitis. Moderate bilateral  pleural effusions with passive atelectasis.  Included Upper Abdomen: Unremarkable  Musculoskeletal: Mild thoracic spondylosis. Multilevel Schmorl's nodes.  IMPRESSION: 1. Moderate bilateral  pleural effusions with passive atelectasis. 2. Stable appearance of interstitial accentuation with some faint patchy ground-glass opacities in the lung apices favoring low-level edema or alveolitis. 3. Numerous scattered upper normal sized mediastinal lymph nodes. 4. Mild thoracic spondylosis. 5.  Aortic Atherosclerosis (ICD10-I70.0).   Electronically Signed By: Ryan Salvage M.D. On: 11/02/2023 13:13  Narrative CLINICAL DATA:  78M with aortic stenosis being evaluated for a TAVR procedure.  EXAM: Cardiac TAVR CT  TECHNIQUE: A non-contrast, gated CT scan was obtained with axial slices of 2.5 mm through the heart for aortic valve scoring. A 120 kV retrospective, gated, contrast cardiac scan was obtained. Gantry rotation speed was 230 msec and collimation was 0.63 mm. Nitroglycerin  was not given. The 3D dataset was reconstructed in systole with motion correction. The 3D data set was reconstructed in 5% intervals of the 0-95% of the R-R cycle. Systolic and diastolic phases were analyzed on a dedicated workstation using MPR, MIP, and VRT modes. The patient received 100 cc of contrast.  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  Aortic valve calcium  score: 907  Aortic annulus:  Diameter: 27mm x 21mm  Perimeter: 75mm  Area: 421mm^2  Calcifications: No calcifications  Coronary height: Min Left - 9mm ; Min Right - 12mm  Sinotubular height: Left cusp - 16mm; Right cusp - 18mm; Noncoronary cusp - 21mm  LVOT (as measured 3 mm below the annulus):  Diameter: 29mm x 22mm  Area: 422mm^2  Calcifications: No calcifications  Aortic sinus width: Left cusp - 31mm; Right cusp - 28mm;  Noncoronary cusp - 32mm  Sinotubular junction width: 32mm x 30mm  Optimum Fluoroscopic Angle for Delivery: RAO 9 CAU 15  Cardiac:  Right atrium: Normal size  Right ventricle: Normal size  Pulmonary arteries: Normal size  Pulmonary veins: Normal configuration  Left atrium: Mild enlargement  Left ventricle: Normal size  Pericardium: Normal thickness  Coronary arteries: Normal origins. S/p CABG with patent LIMA-LAD and SVG-OM with multiple stents within SVG-OM  IMPRESSION: 1. 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 Mild to moderate AV calcifications (AV calcium  score 907)  2. Aortic annulus measures 27mm x 21mm in diameter with perimeter 75mm and area 461mm^2. No annular or LVOT calcifications. Annular measurements suitable for delivery of 23mm Edwards Sapien 3 valve  3. Low coronary height to left main, measuring 9mm. Sufficient coronary to annulus distance for RCA, measures 12mm  4.  Optimum Fluoroscopic Angle for Delivery:  RAO 9 CAU 15  5. S/p CABG with patent LIMA-LAD and SVG-OM with multiple stents within SVG-OM  Electronically Signed: By: Lonni Nanas M.D. On: 11/02/2023 13:06     ______________________________________________________________________________________________      Laboratory Data:  High Sensitivity Troponin:   Recent Labs  Lab 10/23/23 1620 10/23/23 1753 10/29/23 1545 10/29/23 1823 10/30/23 0041  TROPONINIHS 525* 520* 258* 255* 298*     Chemistry Recent Labs  Lab 10/31/23 0349 11/01/23 0303 11/02/23 0303  NA 136 136 136  K 4.1 3.9 3.8  CL 100 98 97*  CO2 23 25 25   GLUCOSE 158* 171* 135*  BUN 26* 28* 30*  CREATININE 1.30* 1.40* 1.49*  CALCIUM  9.1 9.0 9.0  GFRNONAA 56* 51* 47*  ANIONGAP 13 13 14     No results for input(s): PROT, ALBUMIN, AST, ALT, ALKPHOS, BILITOT in the last 168 hours. Hematology Recent Labs  Lab 10/31/23 [phone]  11/01/23 0303 11/02/23  0303  WBC 9.2 9.2 9.1  RBC 4.20* 4.11* 4.08*  HGB 11.8* 11.8* 11.6*  HCT 36.8* 35.5* 34.3*  MCV 87.6 86.4 84.1  MCH 28.1 28.7 28.4  MCHC 32.1 33.2 33.8  RDW 13.2 13.1 13.2  PLT 398 398 373   BNP Recent Labs  Lab 10/29/23 1930  BNP 1,155.5*    DDimer No results for input(s): DDIMER in the last 168 hours.   Radiology/Studies:  DG Chest 1 View Result Date: 11/02/2023 CLINICAL DATA:  Status post thoracentesis. EXAM: CHEST  1 VIEW COMPARISON:  Chest radiograph dated 10/31/2023 FINDINGS: Trace right pleural effusion. No pneumothorax. There is mild cardiomegaly and mild vascular congestion. Median sternotomy wires. No acute osseous pathology. IMPRESSION: Trace right pleural effusion. No pneumothorax. Electronically Signed   By: Vanetta Chou M.D.   On: 11/02/2023 14:22   IR THORACENTESIS ASP PLEURAL SPACE W/IMG GUIDE Result Date: 11/02/2023 INDICATION: 78 year old male with right pleural effusion. Therapeutic thoracentesis requested. EXAM: ULTRASOUND GUIDED RIGHT THORACENTESIS MEDICATIONS: 10 mL 1% lidocaine  COMPLICATIONS: None immediate. PROCEDURE: An ultrasound guided thoracentesis was thoroughly discussed with the patient and questions answered. The benefits, risks, alternatives and complications were also discussed. The patient understands and wishes to proceed with the procedure. Written consent was obtained. Ultrasound was performed to localize and mark an adequate pocket of fluid in the right chest. The area was then prepped and draped in the normal sterile fashion. 1% Lidocaine  was used for local anesthesia. Under ultrasound guidance a 6 Fr Safe-T-Centesis catheter was introduced. Thoracentesis was performed. The catheter was removed and a dressing applied. FINDINGS: A total of approximately 800 mL of dark yellow fluid was removed. Samples were sent to the laboratory as requested by the clinical team. IMPRESSION: Successful ultrasound guided right thoracentesis  yielding 800 mL of pleural fluid. Performed by: Kacie Matthews PA-C Electronically Signed   By: Ester Sides M.D.   On: 11/02/2023 14:05   CT CORONARY MORPH W/CTA COR W/SCORE W/CA W/CM &/OR WO/CM Addendum Date: 11/02/2023 ADDENDUM REPORT: 11/02/2023 13:13 EXAM: OVER-READ INTERPRETATION CARDIAC CT CHEST The following report is an over-read performed by radiologist Dr. Ryan Salvage of [facility] on 11/02/2023. This over-read does not include interpretation of cardiac or coronary anatomy or pathology. The cardiac interpretation by the cardiologist is attached or will be attached. COMPARISON:  CT chest also from 11/02/2023 FINDINGS: Extracardiac Vascular: Atheromatous vascular calcification of the thoracic aorta and branch vessels. Mediastinum: Numerous scattered upper normal sized mediastinal lymph nodes. Lung: Stable appearance of interstitial accentuation with some faint patchy ground-glass opacities in the lung apices favoring low-level edema or alveolitis. Moderate bilateral pleural effusions with passive atelectasis. Included Upper Abdomen: Unremarkable Musculoskeletal: Mild thoracic spondylosis. Multilevel Schmorl's nodes. IMPRESSION: 1. Moderate bilateral pleural effusions with passive atelectasis. 2. Stable appearance of interstitial accentuation with some faint patchy ground-glass opacities in the lung apices favoring low-level edema or alveolitis. 3. Numerous scattered upper normal sized mediastinal lymph nodes. 4. Mild thoracic spondylosis. 5.  Aortic Atherosclerosis (ICD10-I70.0). Electronically Signed   By: Ryan Salvage M.D.   On: 11/02/2023 13:13   Result Date: 11/02/2023 CLINICAL DATA:  57M with aortic stenosis being evaluated for a TAVR procedure. EXAM: Cardiac TAVR CT TECHNIQUE: A non-contrast, gated CT scan was obtained with axial slices of 2.5 mm through the heart for aortic valve scoring. A 120 kV retrospective, gated, contrast cardiac scan was obtained. Gantry rotation  speed was 230 msec and collimation was 0.63 mm. Nitroglycerin  was not given. The 3D dataset  was reconstructed in systole with motion correction. The 3D data set was reconstructed in 5% intervals of the 0-95% of the R-R cycle. Systolic and diastolic phases were analyzed on a dedicated workstation using MPR, MIP, and VRT modes. The patient received 100 cc of contrast. 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 Aortic valve calcium  score: 907 Aortic annulus: Diameter: 27mm x 21mm Perimeter: 75mm Area: 462mm^2 Calcifications: No calcifications Coronary height: Min Left - 9mm ; Min Right - 12mm Sinotubular height: Left cusp - 16mm; Right cusp - 18mm; Noncoronary cusp - 21mm LVOT (as measured 3 mm below the annulus): Diameter: 29mm x 22mm Area: 454mm^2 Calcifications: No calcifications Aortic sinus width: Left cusp - 31mm; Right cusp - 28mm; Noncoronary cusp - 32mm Sinotubular junction width: 32mm x 30mm Optimum Fluoroscopic Angle for Delivery: RAO 9 CAU 15 Cardiac: Right atrium: Normal size Right ventricle: Normal size Pulmonary arteries: Normal size Pulmonary veins: Normal configuration Left atrium: Mild enlargement Left ventricle: Normal size Pericardium: Normal thickness Coronary arteries: Normal origins. S/p CABG with patent LIMA-LAD and SVG-OM with multiple stents within SVG-OM IMPRESSION: 1. 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 Mild to moderate AV calcifications (AV calcium  score 907) 2. Aortic annulus measures 27mm x 21mm in diameter with perimeter 75mm and area 475mm^2. No annular or LVOT calcifications. Annular measurements suitable for delivery of 23mm Edwards Sapien 3 valve 3. Low coronary height to left main, measuring 9mm. Sufficient coronary to annulus distance for RCA, measures 12mm 4.  Optimum Fluoroscopic Angle for Delivery:  RAO 9 CAU 15 5. S/p CABG with  patent LIMA-LAD and SVG-OM with multiple stents within SVG-OM Electronically Signed: By: Lonni Nanas M.D. On: 11/02/2023 13:06   CT ANGIO CHEST AORTA W/CM & OR WO/CM Result Date: 11/02/2023 CLINICAL DATA:  Aortic valvular disease. Preoperative evaluation prior to aortic valve replacement. EXAM: CT ANGIOGRAPHY CHEST, ABDOMEN AND PELVIS TECHNIQUE: Non-contrast CT of the chest was initially obtained. Multidetector CT imaging through the chest, abdomen and pelvis was performed using the standard protocol during bolus administration of intravenous contrast. Multiplanar reconstructed images and MIPs were obtained and reviewed to evaluate the vascular anatomy. RADIATION DOSE REDUCTION: This exam was performed according to the departmental dose-optimization program which includes automated exposure control, adjustment of the mA and/or kV according to patient size and/or use of iterative reconstruction technique. CONTRAST:  OMNIPAQUE  IOHEXOL  350 MG/ML SOLN COMPARISON:  CT scan of the chest 10/23/2023 FINDINGS: CTA CHEST FINDINGS Cardiovascular: Conventional 3 vessel arch anatomy. Extensive heterogeneous atherosclerotic plaque throughout the aorta. The aortic valve is thickened and calcified. The aortic root is normal in caliber at 3.2 cm in maximal diameter measured at the sinuses of Valsalva. The ascending thoracic aorta is normal in caliber at 3.5 cm. The transverse and descending thoracic aorta are both normal in caliber. Patient is status post median sternotomy with evidence of prior multivessel CABG. Cardiomegaly is present. No pericardial effusion. Mediastinum/Nodes: Unremarkable CT appearance of the thyroid gland. No suspicious mediastinal or hilar adenopathy. No soft tissue mediastinal mass. The thoracic esophagus is unremarkable. Lungs/Pleura: Moderate to large layering right pleural effusion. A moderate layering left pleural effusion. Mild patchy ground-glass attenuation airspace opacity in the  central aspect of the upper lungs. Diffuse lower lobe bronchial wall thickening. Subpleural reticulation with architectural distortion and early honeycombing in the periphery of the lower lungs suggests early pulmonary fibrosis. No pneumothorax. Musculoskeletal:  No acute fracture or aggressive appearing lytic or blastic osseous lesion. Review of the MIP images confirms the above findings. CTA ABDOMEN AND PELVIS FINDINGS VASCULAR Aorta: Extensive fibrofatty atherosclerotic plaque throughout the abdominal aorta. No aneurysm or dissection. Celiac: Patent without evidence of aneurysm, dissection, vasculitis or significant stenosis. Lateral segmental branch of the left hepatic artery is replaced to the left gastric artery. SMA: Patent without evidence of aneurysm, dissection, vasculitis or significant stenosis. Renals: Right renal artery is patent. Atherosclerotic plaque results in perhaps mild stenosis at the vessel origin. No aneurysm, dissection or changes of FMD. Critical stenosis at the origin of the left renal artery. IMA: Patent without evidence of aneurysm, dissection, vasculitis or significant stenosis. Inflow: Minimal tortuosity. Extensive fibrofatty atherosclerotic plaque results in significant stenosis in the left common iliac artery. The arterial lumen measures as small as 4 mm. Further, there is a focal non flow limiting dissection flap likely due to a penetrating atherosclerotic ulceration more distally in the left common iliac artery. There is moderate stenosis at this location as well. The external iliac artery is relatively spared from disease. On the right, extensive plaque is present but the vessel remains widely patent. High-grade stenosis of the origin of the left internal iliac artery. The right internal iliac artery is widely patent. Veins: No focal venous abnormality. Review of the MIP images confirms the above findings. NON-VASCULAR Hepatobiliary: No focal liver abnormality is seen. No  gallstones, gallbladder wall thickening, or biliary dilatation. Pancreas: Unremarkable. No pancreatic ductal dilatation or surrounding inflammatory changes. Spleen: Normal in size without focal abnormality. Adrenals/Urinary Tract: Normal adrenal glands. No hydronephrosis, nephrolithiasis or enhancing renal mass. Relatively delayed perfusion of the left kidney as expected given left renal artery stenosis. Ureters and bladder are unremarkable. Stomach/Bowel: No focal bowel wall thickening or evidence of obstruction. Lymphatic: No suspicious lymphadenopathy. Reproductive: Prostate is unremarkable. Other: Small fat containing umbilical hernia. No evidence of ascites. Musculoskeletal: No acute fracture or malalignment. Healed median sternotomy. Extensive multilevel degenerative disc disease and bilateral facet arthropathy. Left hip arthroplasty prosthesis. Large cystic soft tissue collection in compass is the acetabular component and breaks through the medial wall of the pelvis. There is a narrow zone of transition. Slightly limited evaluation due to streak artifact from the hip arthroplasty prosthesis. Review of the MIP images confirms the above findings. IMPRESSION: VASCULAR 1. Thickened and calcified aortic valve consistent with the clinical history of aortic valvular disease. 2. Extensive mixed calcified and fibrofatty atherosclerotic plaque throughout the thoracoabdominal aorta without evidence of aneurysm or dissection. 3. Moderate stenoses of the left common and proximal external iliac arteries. 4. Right femoral and iliac arteries are widely patent and suitable for access. 5. Minimal tortuosity of the access vessels. 6. 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 NON VASCULAR 1. Abnormal circumscribed low-attenuation/cystic collection in golfing the acetabular component of the left hip arthroplasty prosthesis with adjacent osteolysis and erosion of the medial wall of the left acetabulum/pelvis.  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 No specific follow-up imaging is recommended unless the patient has clinical symptoms of left hip pain. 2. Moderate to large right and moderate left layering pleural effusions with associated atelectasis. 3. 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 4. 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 5. Small fat containing umbilical hernia. 6. Multilevel degenerative disc disease and lower lumbar facet arthropathy. Electronically Signed   By: Wilkie Lent M.D.   On: 11/02/2023 10:42   CT Angio Abd/Pel w/ and/or w/o  Result Date: 11/02/2023 CLINICAL DATA:  Aortic valvular disease. Preoperative evaluation prior to aortic valve replacement. EXAM: CT ANGIOGRAPHY CHEST, ABDOMEN AND PELVIS TECHNIQUE: Non-contrast CT of the chest was initially obtained. Multidetector CT imaging through the chest, abdomen and pelvis was performed using the standard protocol during bolus administration of intravenous contrast. Multiplanar reconstructed images and MIPs were obtained and reviewed to evaluate the vascular anatomy. RADIATION DOSE REDUCTION: This exam was performed according to the departmental dose-optimization program which includes automated exposure control, adjustment of the mA and/or kV according to patient size and/or use of iterative reconstruction technique. CONTRAST:  OMNIPAQUE  IOHEXOL  350 MG/ML SOLN COMPARISON:  CT scan of the chest 10/23/2023 FINDINGS: CTA CHEST FINDINGS Cardiovascular: Conventional 3 vessel arch anatomy. Extensive heterogeneous atherosclerotic plaque throughout the aorta. The aortic valve is thickened and calcified. The aortic root is normal in caliber at 3.2 cm in maximal diameter measured at the sinuses of Valsalva. The ascending thoracic aorta is normal in caliber at 3.5 cm. The transverse and descending thoracic aorta are both normal in caliber. Patient is status post median sternotomy with evidence of  prior multivessel CABG. Cardiomegaly is present. No pericardial effusion. Mediastinum/Nodes: Unremarkable CT appearance of the thyroid gland. No suspicious mediastinal or hilar adenopathy. No soft tissue mediastinal mass. The thoracic esophagus is unremarkable. Lungs/Pleura: Moderate to large layering right pleural effusion. A moderate layering left pleural effusion. Mild patchy ground-glass attenuation airspace opacity in the central aspect of the upper lungs. Diffuse lower lobe bronchial wall thickening. Subpleural reticulation with architectural distortion and early honeycombing in the periphery of the lower lungs suggests early pulmonary fibrosis. No pneumothorax. Musculoskeletal: No acute fracture or aggressive appearing lytic or blastic osseous lesion. Review of the MIP images confirms the above findings. CTA ABDOMEN AND PELVIS FINDINGS VASCULAR Aorta: Extensive fibrofatty atherosclerotic plaque throughout the abdominal aorta. No aneurysm or dissection. Celiac: Patent without evidence of aneurysm, dissection, vasculitis or significant stenosis. Lateral segmental branch of the left hepatic artery is replaced to the left gastric artery. SMA: Patent without evidence of aneurysm, dissection, vasculitis or significant stenosis. Renals: Right renal artery is patent. Atherosclerotic plaque results in perhaps mild stenosis at the vessel origin. No aneurysm, dissection or changes of FMD. Critical stenosis at the origin of the left renal artery. IMA: Patent without evidence of aneurysm, dissection, vasculitis or significant stenosis. Inflow: Minimal tortuosity. Extensive fibrofatty atherosclerotic plaque results in significant stenosis in the left common iliac artery. The arterial lumen measures as small as 4 mm. Further, there is a focal non flow limiting dissection flap likely due to a penetrating atherosclerotic ulceration more distally in the left common iliac artery. There is moderate stenosis at this location as  well. The external iliac artery is relatively spared from disease. On the right, extensive plaque is present but the vessel remains widely patent. High-grade stenosis of the origin of the left internal iliac artery. The right internal iliac artery is widely patent. Veins: No focal venous abnormality. Review of the MIP images confirms the above findings. NON-VASCULAR Hepatobiliary: No focal liver abnormality is seen. No gallstones, gallbladder wall thickening, or biliary dilatation. Pancreas: Unremarkable. No pancreatic ductal dilatation or surrounding inflammatory changes. Spleen: Normal in size without focal abnormality. Adrenals/Urinary Tract: Normal adrenal glands. No hydronephrosis, nephrolithiasis or enhancing renal mass. Relatively delayed perfusion of the left kidney as expected given left renal artery stenosis. Ureters and bladder are unremarkable. Stomach/Bowel: No focal bowel wall thickening or evidence of obstruction. Lymphatic: No suspicious lymphadenopathy. Reproductive: Prostate is unremarkable.  Other: Small fat containing umbilical hernia. No evidence of ascites. Musculoskeletal: No acute fracture or malalignment. Healed median sternotomy. Extensive multilevel degenerative disc disease and bilateral facet arthropathy. Left hip arthroplasty prosthesis. Large cystic soft tissue collection in compass is the acetabular component and breaks through the medial wall of the pelvis. There is a narrow zone of transition. Slightly limited evaluation due to streak artifact from the hip arthroplasty prosthesis. Review of the MIP images confirms the above findings. IMPRESSION: VASCULAR 1. Thickened and calcified aortic valve consistent with the clinical history of aortic valvular disease. 2. Extensive mixed calcified and fibrofatty atherosclerotic plaque throughout the thoracoabdominal aorta without evidence of aneurysm or dissection. 3. Moderate stenoses of the left common and proximal external iliac arteries. 4.  Right femoral and iliac arteries are widely patent and suitable for access. 5. Minimal tortuosity of the access vessels. 6. 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 NON VASCULAR 1. Abnormal circumscribed low-attenuation/cystic collection in golfing the acetabular component of the left hip arthroplasty prosthesis with adjacent osteolysis and erosion of the medial wall of the left acetabulum/pelvis. 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 No specific follow-up imaging is recommended unless the patient has clinical symptoms of left hip pain. 2. Moderate to large right and moderate left layering pleural effusions with associated atelectasis. 3. 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 4. 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 5. Small fat containing umbilical hernia. 6. Multilevel degenerative disc disease and lower lumbar facet arthropathy. Electronically Signed   By: Wilkie Lent M.D.   On: 11/02/2023 10:42   DG CHEST PORT 1 VIEW Result Date: 10/31/2023 CLINICAL DATA:  Breathing issues for 2 weeks EXAM: PORTABLE CHEST 1 VIEW COMPARISON:  10/29/2023, 03/29/2022 FINDINGS: Sternotomy and coronary stents. Mild cardiomegaly with vascular congestion and diffuse interstitial edema. Small right greater than left pleural effusions. No pneumothorax. IMPRESSION: Cardiomegaly with vascular congestion and diffuse interstitial edema. Small right greater than left pleural effusions. These findings do not appear significantly worsened compared with 10/29/2023 Electronically Signed   By: Luke Bun M.D.   On: 10/31/2023 17:57   DG Chest Portable 1 View Result Date: 10/29/2023 CLINICAL DATA:  Shortness of breath EXAM: PORTABLE CHEST 1 VIEW COMPARISON:  10/23/2023 FINDINGS: Prior CABG. Heart borderline in size. Small to moderate right pleural effusion. Diffuse interstitial and alveolar opacities have increased concerning for edema. No acute bony abnormality.  IMPRESSION: Diffuse interstitial and alveolar opacities, worsening since prior study concerning for edema. Small to moderate right pleural effusion. Electronically Signed   By: Franky Crease M.D.   On: 10/29/2023 17:35    STS Risk Calculator:    Procedure Type: Isolated AVR  PERIOPERATIVE OUTCOME ESTIMATE %  Operative Mortality 7.68%  Morbidity & Mortality 32.7%  Stroke 2.78%  Renal Failure 4.13%  Reoperation 8.45%  Prolonged Ventilation 22%  Deep Sternal Wound Infection 0.159%  Long Hospital Stay (>14 days) 26.2%  Short Hospital Stay (<6 days)* 15.9%    _____________________   [place] Cardiomyopathy Questionnaire      10/31/2023    1:50 PM  [facility]Q-12  1 a. Ability to shower/bathe Slightly limited  1 b. Ability to walk 1 block Extremely limited  1 c. Ability to hurry/jog Other, Did not do  2. Edema feet/ankles/legs Never over the past 2 weeks  3. Limited by fatigue All of the time  4. Limited by dyspnea All of the time  5. Sitting up / on 3+ pillows Every night  6.  Limited enjoyment of life Extremely limited  7. Rest of life w/ symptoms Not at all satisfied  8 a. Participation in hobbies Severely limited  8 b. Participation in chores Severely limited  8 c. Visiting family/friends Severely limited      ______________________________   Pre Surgical Assessment: 5 M Walk Test   57M=16.75ft   5 Meter Walk Test- trial 1: 8 seconds 5 Meter Walk Test- trial 2: 7.9 seconds 5 Meter Walk Test- trial 3: 8 seconds 5 Meter Walk Test Average: 8 seconds   Assessment and Plan:   Luke Harvey is a 78 y.o. 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   Cath 10/24/23 (done last week in the setting of NSTEMI) showed severe native LCA disease with occcluded LCX and LAD, patent RCA stents, patent LIMA to LAD, SVG-OM had 70% stenosis s/p successful PCI of vein graft with 2 overlapping stents.    Echo 10/25/23 showed LVEF 25-30%, LV  RWMAs, G2DD, mod-severe MAC with mild-mod MR/TR, moderate AI and severe LFLG AS: mean gradient 17.7 mmHg, V-max 2.81 m/s, [name] 0.93 cm, DVI 0.29, SVI 26.  The right and left aortic cusps were noted to be virtually immobile questioning fusion, while the posterior noncoronary cusp had near normal mobility. The regional wall motion abnormalities were not felt to correlate to his angiographic findings and felt to possibly indicate aortic stenosis as the cause of his cardiomyopathy.  Cardiac gated CTA of the heart revealed anatomical characteristics consistent with aortic stenosis suitable for treatment by transcatheter aortic valve replacement. 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 Left main height noted to be low at 9mm but pt has a functioning LIMA --> LAD. CTA of the aorta and iliac vessels demonstrated what appears to be adequate pelvic vascular access to facilitate a right transfemoral approach. However, there was extensive mixed calcified and fibrofatty atherosclerotic plaque throughout the thoracoabdominal aorta without evidence of aneurysm or dissection. Scan also showed mod-large right pleural effusion s/p thoracentesis yielding 800cc fluid. Brilinta  was converted to Plavix  in the case that it was contributing to his episodic dyspnea.    I have reviewed the natural history of aortic stenosis with the patient. We have discussed the limitations of medical therapy and the poor prognosis associated with symptomatic aortic stenosis. We have reviewed potential treatment options, including palliative medical therapy, conventional surgical aortic valve replacement, and transcatheter aortic valve replacement. We discussed treatment options in the context of this patient's specific comorbid medical conditions.    The patient's predicted risk of mortality with conventional aortic valve replacement is 7.68%  primarily based on age, LV dysfunction, extensive CAD with previous CABG with recent NSTEMI and intervention and acute CHF. Other significant comorbid conditions include frailty.   Plan TAVR-Right TF with 23mm Celestia MOLDER on Friday at 7:30am with Dr. Shyrl and Dr. Wonda. He would not be a bailout candidate.     Signed, Lamarr Hummer, PA-C  11/02/2023 3:26 PM  Agree with above This is a 78yo male with severe aortic stenosis.  He has a history of previous CABG to the LAD and OM.  Both grafts remain patent.  On review of his imaging, one area of concern is a low left main ostium, but he is protected with his patent bypasses.  He has good acces for a right transfemoral TAVR with a 23mm  S3UR.  Given his previous sternotomy, he would not be a candidate for bailout.  The risks and benefits of a transfemoral TAVR were discussed in detail.  Aldena Worm MALVA Rayas

## 2023-11-02 NOTE — Progress Notes (Signed)
 Patient has a soft BP 90/60 with a MAP fof 65; feeling dizzy and weak. Cardiology and HF team made aware. Meds adjusted and help per providers.

## 2023-11-02 NOTE — Plan of Care (Signed)
 Pt vital signs and assessment are both unremarkable

## 2023-11-02 NOTE — H&P (View-Only) (Signed)
 [facility]ENTER   MULTIDISCIPLINARY HEART VALVE TEAM  Cardiology Consultation:   Patient ID: Luke Harvey MRN: [id number]; DOB: December 07, 1945  Admit date: 10/29/2023 Date of Consult: 11/02/2023  Primary Care Provider: [facility], [facility] Cardiologist: Lonni LITTIE Nanas, MD  [facility] Electrophysiologist:  None    Patient Profile:   Luke Harvey is a 78 y.o. male with a hx of  CAD s/p CABG x2V (2014), multiple PCI's, CKD stage IIIa, ICM w/ EF of 25 to 30%, HLD, T2DM, and LFLG AS who is being seen today for the evaluation of severe aortic stenosis at the request of Dr. Wonda.  History of Present Illness:   Luke Harvey lives in [place] .  He lives alone and takes care of his own ADLs.  He continues to drive.  [name], [name], who lives in [place] and helps take care of him. He has good dental heath.   Underwent CABG in 2014 with LIMA to LAD and SVG to LCx. In 2015 he underwent stenting of the RCA.  In 2018, he underwent PCI/DES for ISR of RCA in the setting of NSTEMI. He was admitted 03/2020 with unstable angina. S/p DES to SVG--> OM x 3 and staged CSI orbital atherectomy shockwave IVUS lithotripsy to prox-mid RCA and shockwave lithotripsy and POBA to distal RCA. He had accelerating chest pain in 2023. Cath showed patent LIMA to LAD, SVG to OM with 50% ostial and 25% proximal in-stent restenosis. There was 95% stenosis at the proximal marginal of mid graft stent s/p PCI/DES x1 to SVG to OM overlapping previously placed stent. Plan for DAPT with ASA/Brilinta , ideally indefinitely.    He has been followed by the [facility] but was recent admitted to [facility] 6/29-10/26/23 for NSTEMI. Peak troponin 525. Cath showed severe native LCA disease with occcluded LCX and LAD, patent RCA stents, patent LIMA to LAD, SVG-OM had 70% stenosis s/p successful PCI of vein graft with 2 overlapping stents. Echo 10/25/23 showed LVEF 25-30%, LV RWMAs, G2DD,  mod-severe MAC with mild-mod MR/TR, moderate AI and severe LFLG AS: mean gradient 17.7 mmHg, V-max 2.81 m/s, [name] 0.93 cm, DVI 0.29, SVI 26.  The right and left aortic cusps were noted to be virtually immobile questioning fusion, while the posterior noncoronary cusp had near normal mobility. The regional wall motion abnormalities were not felt to correlate to his angiographic findings and felt to possibly indicate aortic stenosis as the cause of his cardiomyopathy. Outpatient TAVR consult was recommended and set up for 7/23.   He presented back to [facility] on 10/29/2023 with worsening exertional shortness of breath.  BNP elevated ~1200, hstrop 258-->255-->298. CXR with pulmonary edema and small to moderate right pleural effusion. He reported severe attacks of shortness of breath that correlated with runs of atrial tachycardia. He has been diuresed 3.8L. BP meds held given symptomatic hypotension.   Cardiac gated CTA of the heart revealed anatomical characteristics consistent with aortic stenosis suitable for treatment by transcatheter aortic valve replacement. 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 Left main height noted to be low at 9mm but pt has an functioning LIMA --> LAD. CTA of the aorta and iliac vessels demonstrated what appears to be adequate pelvic vascular access to facilitate a right transfemoral approach. However, there was extensive mixed calcified and fibrofatty atherosclerotic plaque throughout the thoracoabdominal aorta without evidence of aneurysm  or dissection. Scan also showed mod-large right pleural effusion s/p thoracentesis yielding 800cc fluid. Brilinta  was converted to Plavix  in the case that it was contributing to his episodic dyspnea.       Past Medical History:  Diagnosis Date   Age-related macular degeneration, dry, both eyes    Anxiety    Aortic stenosis     Arthritis    probably all over (06/08/2016)   Cardiomyopathy (HCC)    a. EF 40-45% in 2018, normalized in 03/2020.   Chronic back pain    started in my lower back; going up my back in the last couple months (06/08/2016)   Chronic sinusitis    Coronary artery disease    a. s/p CABGx 2V (2014 in [place])  b. PCI (2015 in [place]). c. PCI 2018 ([facility]). d. PCI 03/2020 (complex - [facility]).   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 06/09/2016   NSTEMI (non-ST elevated myocardial infarction) (HCC) 06/08/2016   Pneumonia    3-4 times maybe (06/08/2016)   PVD (peripheral vascular disease) (HCC)    Recovering alcoholic (HCC)    picked up my 30 year chip the other day (06/08/2016)   S/P angioplasty with stent, 06/08/16 DES, for in-stent restenosis in RCA. 06/09/2016   Sleep apnea    got a mask after OR; don't use it (06/08/2016)   Type II diabetes mellitus (HCC)     Past Surgical History:  Procedure Laterality Date   ANTERIOR CERVICAL DECOMP/DISCECTOMY FUSION     BACK SURGERY     CARDIAC CATHETERIZATION  03/2012   led to bypass   CAROTID ENDARTERECTOMY Right ~ 2014   COLONOSCOPY W/ BIOPSIES AND POLYPECTOMY     CORONARY ANGIOPLASTY WITH STENT PLACEMENT  2015; 06/08/2016   CORONARY ARTERY BYPASS GRAFT  04/10/2012   CORONARY ATHERECTOMY N/A 04/04/2020   Procedure: CORONARY ATHERECTOMY;  Surgeon: Burnard Debby LABOR, MD;  Location: [facility] INVASIVE CV LAB;  Service: Cardiovascular;  Laterality: N/A;   CORONARY STENT INTERVENTION N/A 06/08/2016   Procedure: Coronary Stent Intervention;  Surgeon: Debby LABOR Burnard, MD;  Location: [facility] INVASIVE  CV LAB;  Service: Cardiovascular;  Laterality: N/A;   CORONARY STENT INTERVENTION N/A 04/02/2020   Procedure: CORONARY STENT INTERVENTION;  Surgeon: Anner Alm ORN, MD;  Location: [facility] INVASIVE CV LAB;  Service: Cardiovascular;  Laterality: N/A;   CORONARY STENT INTERVENTION N/A 04/04/2020   Procedure: CORONARY STENT INTERVENTION;  Surgeon: Burnard Debby LABOR, MD;  Location: [facility] INVASIVE CV LAB;  Service: Cardiovascular;  Laterality: N/A;   CORONARY STENT INTERVENTION N/A 11/16/2021   Procedure: CORONARY STENT INTERVENTION;  Surgeon: Mady Bruckner, MD;  Location: [facility] INVASIVE CV LAB;  Service: Cardiovascular;  Laterality: N/A;   CORONARY STENT INTERVENTION N/A 10/24/2023   Procedure: CORONARY STENT INTERVENTION;  Surgeon: Anner Alm ORN, MD;  Location: [facility] INVASIVE CV LAB;  Service: Cardiovascular;  Laterality: N/A;   CORONARY ULTRASOUND/IVUS N/A 04/04/2020   Procedure: Intravascular Ultrasound/IVUS;  Surgeon: Burnard Debby LABOR, MD;  Location: [facility] INVASIVE CV LAB;  Service: Cardiovascular;  Laterality: N/A;   IR  THORACENTESIS ASP PLEURAL SPACE W/IMG GUIDE  11/02/2023   JOINT REPLACEMENT     LEFT HEART CATH AND CORONARY ANGIOGRAPHY N/A 06/08/2016   Procedure: Left Heart Cath and Coronary Angiography;  Surgeon: Debby DELENA Sor, MD;  Location: [facility] INVASIVE CV LAB;  Service: Cardiovascular;  Laterality: N/A;   LEFT HEART CATH AND CORS/GRAFTS ANGIOGRAPHY N/A 04/02/2020   Procedure: LEFT HEART CATH AND CORS/GRAFTS ANGIOGRAPHY;  Surgeon: Anner Alm ORN, MD;  Location: [facility] INVASIVE CV LAB;  Service: Cardiovascular;  Laterality: N/A;   LEFT HEART CATH AND CORS/GRAFTS ANGIOGRAPHY N/A 11/16/2021   Procedure: LEFT HEART CATH AND CORS/GRAFTS ANGIOGRAPHY;  Surgeon: Mady Bruckner, MD;  Location: [facility] INVASIVE CV LAB;  Service: Cardiovascular;  Laterality: N/A;   REVISION TOTAL HIP ARTHROPLASTY Left ~ 2004   RIGHT/LEFT HEART CATH AND CORONARY ANGIOGRAPHY N/A 10/24/2023   Procedure: RIGHT/LEFT HEART CATH AND CORONARY  ANGIOGRAPHY;  Surgeon: Anner Alm ORN, MD;  Location: [facility] INVASIVE CV LAB;  Service: Cardiovascular;  Laterality: N/A;   TEMPORARY PACEMAKER N/A 04/04/2020   Procedure: TEMPORARY PACEMAKER;  Surgeon: Sor Debby DELENA, MD;  Location: [facility] INVASIVE CV LAB;  Service: Cardiovascular;  Laterality: N/A;   TONSILLECTOMY     TOTAL HIP ARTHROPLASTY Left 1990s     Home Medications:  Prior to Admission medications   Medication Sig Start Date End Date Taking? Authorizing Provider  acetaminophen  (TYLENOL ) 500 MG tablet Take 500 mg by mouth 2 (two) times daily.   Yes [provider]  APPLE CIDER VINEGAR PO Take 1 capsule by mouth daily at 12 noon.   Yes [provider]  aspirin  EC 81 MG tablet Take 81 mg by mouth daily at 12 noon.   Yes [provider]  BIOTIN PO Take 1 tablet by mouth daily at 12 noon.   Yes [provider]  carboxymethylcellulose (REFRESH PLUS) 0.5 % SOLN Place 1 drop into both eyes See admin instructions. Administer 1 drop into each eye every daily. May use every 12 hours if needed for dry eyes.   Yes [provider]  Cholecalciferol (VITAMIN D-3 PO) Take 1 tablet by mouth daily at 12 noon.   Yes [provider]  Coenzyme Q10 (COQ10 PO) Take 1 capsule by mouth daily at 12 noon.   Yes [provider]  Cyanocobalamin (VITAMIN B12 PO) Take 1 tablet by mouth daily at 12 noon.   Yes [provider]  diclofenac Sodium (VOLTAREN) 1 % GEL 1 Application every 6 (six) hours as needed for pain. 02/02/22  Yes [provider]  DULoxetine  (CYMBALTA ) 30 MG capsule Take 30 mg by mouth at bedtime.   Yes [provider]  empagliflozin  (JARDIANCE ) 10 MG TABS tablet Take 1 tablet (10 mg total) by mouth daily. 10/27/23  Yes Amin, Ankit C, MD  fluticasone  (FLONASE ) 50 MCG/ACT nasal spray Place 1-2 sprays into both nostrils daily as needed for allergies or rhinitis.   Yes [provider]  isosorbide  mononitrate  (IMDUR ) 30 MG 24 hr tablet Take 60 mg by mouth daily at 12 noon.   Yes [provider]  ketoconazole (NIZORAL) 2 % cream Apply 1 Application topically 2 (two) times daily as needed for irritation. 02/23/21  Yes [provider]  ketoconazole (NIZORAL) 2 % shampoo Apply 1 Application topically every 3 (three) days.   Yes [provider]  levOCARNitine (L-CARNITINE PO) Take 2 tablets by mouth daily at 12 noon.   Yes [provider]  levothyroxine  (SYNTHROID ) 75 MCG tablet Take  75 mcg by mouth daily before breakfast. 02/02/22  Yes [provider]  lidocaine  (LIDODERM ) 5 % Place 1 patch onto the skin daily as needed (pain).   Yes [provider]  loratadine  (CLARITIN ) 10 MG tablet Take 10 mg by mouth daily at 12 noon.   Yes [provider]  MAGNESIUM  PO Take 2 tablets by mouth daily at 12 noon.   Yes [provider]  metFORMIN  (GLUCOPHAGE ) 500 MG tablet Take 1,000 mg by mouth 2 (two) times daily as needed (BS > 140). 05/04/06  Yes [provider]  metoprolol  succinate (TOPROL -XL) 50 MG 24 hr tablet Take 1 tablet (50 mg total) by mouth daily. Take with or immediately following a meal. 10/27/23  Yes Amin, Ankit C, MD  MILK THISTLE PO Take 1 tablet by mouth daily at 12 noon.   Yes [provider]  Misc Natural Products (TURMERIC, CURCUMIN, PO) Take 1 tablet by mouth daily at 12 noon.   Yes [provider]  Multiple Vitamins-Minerals (PRESERVISION AREDS 2) CAPS Take 1 capsule by mouth 2 (two) times daily.   Yes [provider]  nitroGLYCERIN  (NITROSTAT ) 0.4 MG SL tablet Place 1 tablet (0.4 mg total) under the tongue See admin instructions. Take 1 tablet (0.4) mg by mouth every day. May take an additional 1 tablet later in the day if  chest pain. 10/26/23  Yes Amin, Ankit C, MD  OVER THE COUNTER MEDICATION Take 1 capsule by mouth daily at 12 noon. VisiUltra   Yes [provider]  OVER THE COUNTER  MEDICATION Take 1 capsule by mouth daily at 12 noon. blood boost supplement for glucose control   Yes [provider]  Probiotic Product (PROBIOTIC PO) Take 2 capsules by mouth daily at 12 noon.   Yes [provider]  QUERCETIN PO Take 1 tablet by mouth daily at 12 noon.   Yes [provider]  rosuvastatin  (CRESTOR ) 40 MG tablet Take 1 tablet (40 mg total) by mouth daily. Patient taking differently: Take 40 mg by mouth at bedtime. 04/05/20  Yes Dunn, Dayna N, PA-C  Saw Palmetto, Serenoa repens, (SAW PALMETTO PO) Take 1 tablet by mouth daily at 12 noon.   Yes [provider]  SELENIUM PO Take 1 tablet by mouth daily at 12 noon.   Yes [provider]  spironolactone  (ALDACTONE ) 25 MG tablet Take 0.5 tablets (12.5 mg total) by mouth daily. 10/26/23  Yes Amin, Ankit C, MD  ticagrelor  (BRILINTA ) 90 MG TABS tablet Take 1 tablet (90 mg total) by mouth 2 (two) times daily. 04/05/20  Yes Dunn, Dayna N, PA-C    Inpatient Medications: Scheduled Meds:  aspirin  EC  81 mg Oral Q1200   clopidogrel   75 mg Oral Daily   DULoxetine   30 mg Oral QHS   heparin   5,000 Units Subcutaneous Q8H   insulin  aspart  0-5 Units Subcutaneous QHS   insulin  aspart  0-9 Units Subcutaneous TID WC   levothyroxine   75 mcg Oral Q0600   loratadine   10 mg Oral Q1200   [START ON 11/03/2023] metoprolol  succinate  25 mg Oral QHS   rosuvastatin   40 mg Oral Daily   sodium chloride  flush  3 mL Intravenous Q12H   Continuous Infusions:  PRN Meds: acetaminophen , artificial tears, fluticasone , ondansetron  (ZOFRAN ) IV, sodium chloride  flush  Allergies:    Allergies  Allergen Reactions   Tetracyclines & Related Hives and Itching   Cozaar [Losartan] Cough   Zestril  [Lisinopril ] Cough  Lipitor [Atorvastatin] Other (See Comments)    Myalgias     Social History:   Social History   Socioeconomic History   Marital status: Divorced    Spouse name: Not on file   Number of children: 2    Years of education: Not on file   Highest education level: GED or equivalent  Occupational History   Occupation: Retired  Tobacco Use   Smoking status: Former    Current packs/day: 0.00    Average packs/day: 1 pack/day for 40.0 years (40.0 ttl pk-yrs)    Types: Cigarettes    Start date: 07/17/1968    Quit date: 07/17/2008    Years since quitting: 15.3   Smokeless tobacco: Never  Substance and Sexual Activity   Alcohol  use: No    Comment: recovering alcoholic, abstinence 30 years.  attends AA weekly   Drug use: No   Sexual activity: Never  Other Topics Concern   Not on file  Social History Narrative   Not on file   Social Drivers of Health   Financial Resource Strain: Not on file  Food Insecurity: No Food Insecurity (10/29/2023)   Hunger Vital Sign    Worried About Running Out of Food in the Last Year: Never true    Ran Out of Food in the Last Year: Never true  Transportation Needs: No Transportation Needs (10/29/2023)   PRAPARE - Administrator, Civil Service (Medical): No    Lack of Transportation (Non-Medical): No  Physical Activity: Not on file  Stress: Not on file  Social Connections: Unknown (10/30/2023)   Social Connection and Isolation Panel    Frequency of Communication with Friends and Family: Never    Frequency of Social Gatherings with Friends and Family: Never    Attends Religious Services: Never    Database administrator or Organizations: Patient declined    Attends Banker Meetings: Patient declined    Marital Status: Not on file  Intimate Partner Violence: Not At Risk (10/29/2023)   Humiliation, Afraid, Rape, and Kick questionnaire    Fear of Current or Ex-Partner: No    Emotionally Abused: No    Physically Abused: No    Sexually Abused: No    Family History:   Family History  Problem Relation Age of Onset   Cancer Mother      ROS:  Please see the history of present illness.  All other ROS reviewed and negative.     Physical  Exam/Data:   Vitals:   11/02/23 0809 11/02/23 0937 11/02/23 1144 11/02/23 1428  BP: (!) 95/53 90/60 113/66 (!) 100/59  Pulse:   86   Resp: 20  18   Temp:   97.7 F (36.5 C)   TempSrc: Oral  Oral   SpO2:   94%   Weight:      Height:        Intake/Output Summary (Last 24 hours) at 11/02/2023 1526 Last data filed at 11/02/2023 0952 Gross per 24 hour  Intake --  Output 1500 ml  Net -1500 ml      11/02/2023    4:12 AM 11/01/2023    4:13 AM 10/31/2023    3:23 AM  Last 3 Weights  Weight (lbs) 136 lb 0.4 oz 139 lb 12.4 oz 138 lb 12.8 oz  Weight (kg) 61.7 kg 63.4 kg 62.959 kg     Body mass index is 21.63 kg/m.  General:  Thin, frail appearing white male  HEENT: normal Neck: no JVD  Endocrine:  No thryomegaly Cardiac:  normal S1, S2; RRR; barely audible systolic murmur Lungs: diminished breath sounds  Abd: soft, nontender, no hepatomegaly  Ext: no edema Musculoskeletal:  No deformities, BUE and BLE strength normal and equal Skin: warm and dry  Neuro:  CNs 2-12 intact, no focal abnormalities noted Psych:  Normal affect    EKG:  The EKG was personally reviewed and demonstrates:  sinus with IVCD, HR 89 Telemetry:  Telemetry was personally reviewed and demonstrates:  sinus with runs of atrial tachycardia   Cardiac Studies & Procedures   ______________________________________________________________________________________________ CARDIAC CATHETERIZATION  CARDIAC CATHETERIZATION 10/24/2023  Conclusion Images from the original result were not included.    Mid LM to Prox LAD lesion is 95% stenosed with 90% stenosed side branch in Ost Cx to Prox Cx.   Prox LAD to Mid LAD lesion is 100% stenosed with 99% stenosed side branch in 2nd Diag.   Previously placed mid RCA to Dist RCA DES stent is 30% stenosed.  Previously placed Prox RCA DES stent of is widely patent.   LIMA-LAD and is very large, and existing no disease.  Retrograde flow to the LAD fills a diagonal branch and antegrade flow  reaches the apex.  No competitive flow.   2nd Mrg lesion is 100% stenosed.   Prox Graft to Mid Graft lesion is 90% stenosed.  (Lesion #3)   A drug-eluting stent was successfully placed, overlapping with previous distal stent and covering the lesion, using a STENT SYNERGY XD 3.0X32.  Deployed to 3.3 mm with stent balloon.   Post intervention, there is a 0% residual stenosis.   Mid Graft lesion is 80% stenosed (lesion #4.)-Focal in-stent restenosis but otherwise the stent is widely patent.  Scoring balloon angioplasty was performed using a BALLOON SCOREFLEX 3.0X10.  Post intervention, there is a 10% residual stenosis.   Dist Graft lesion is 45% stenosed.   Prox Graft-2 lesion is 60% stenosed.  Prox Graft-1 lesion is 25% stenosed.   A drug-eluting stent was successfully placed overlapping the previous ostial and proximal stent and the newly placed stent, using a STENT SYNERGY XD 3.0X24.  Deployed to 3.3 mm. Post intervention, there is a 0% residual stenosis.   Postintervention, there is TIMI-3 flow maintained throughout the SVG-OM 2 graft   ---------------------------------------   Hemodynamic findings consistent with mild pulmonary hypertension.   There is moderate aortic valve stenosis.  Mean gradient estimated 24.3 mmHg.  Trivial from estimated based on right heart cath numbers  Diagnostic  Dominance: Right      Intervention  POST-CATH DIAGNOSES Severe Native Left Coronary Artery Disease with essentially occluded LCx and LAD, very small caliber ramus and OM1 remain within 90% distal LM stenosis. Patent RCA stents with mild stenosis between the stents but otherwise stable flow. Patent LIMA to LAD SVG-OM has ostial 70% stenosis, mid graft there is tandem 60 and 90% stenosis as well as 80% ISR in the proximal edge of the more distal stent. - Successful score flex angioplasty of the ISR and PTCA-DES PCI of the vein graft connecting the 2 previous stent using 2 overlapping Synergy Stents (3.0 mm x 32  Owen 3.0 mm x 24 mm with all stents deployed to roughly 3.3 mm using stent balloon for overlap post dilation all the way to the ostium.  Right Heart Cath: RAP mean 12 mmHg, RVEDP-EDP 58/10-15 mmHg; PAP-mean 43/19-29 mmHg, PCWP 17 mmHg; LV P-EDP 162/1-23 mmHg, AO P-MAP 126/50-81 mmHg. AoV: PSV gradient 38 mm 3 with a  mean of 24 mmHg. (264 ms)-moderate stenosis. Ao sat 98%, PA sat 61%. Fick Cardiac Output-Index 3.76-2.17-moderately reduced.   RECOMMENDATIONS   Anticipated discharge date to be determined.   Patient will need to have echocardiogram completed and reviewed to reassess aortic valve. Titrate GDMT based on echo results and CAD.   Recommend uninterrupted dual antiplatelet therapy with Aspirin  81mg  daily and Ticagrelor  90mg  twice daily for a minimum of 12 months (ACS-Class I recommendation).    Alm MICAEL Clay, MD, MS Alm Clay, M.D., M.S. Interventional Cardiologist [facility] Pager # [phone]  Findings Coronary Findings Diagnostic  Dominance: Right  Left Main Mid LM to Prox LAD lesion is 95% stenosed with 90% stenosed side branch in Ost Cx to Prox Cx.  Left Anterior Descending Prox LAD to Mid LAD lesion is 100% stenosed with 99% stenosed side branch in 2nd Diag.  Ramus Intermedius Vessel is small.  Left Circumflex  First Obtuse Marginal Branch Vessel is small in size.  Second Obtuse Marginal Branch 2nd Mrg lesion is 100% stenosed.  Right Coronary Artery Previously placed Prox RCA stent of unknown type is  widely patent. The lesion is located at the bend and eccentric. The lesion is calcified. Previously placed stent displays no restenosis. Mid RCA to Dist RCA lesion is 30% stenosed. The lesion is located proximal to the major branch. The lesion was previously treated using a drug eluting stent over 2 years ago. Previously placed stent displays restenosis.  Acute Marginal Branch Vessel is small in size.  Right Ventricular Branch Vessel is  small in size.  First Right Posterolateral Branch Vessel is small in size.  Second Right Posterolateral Branch Vessel is moderate in size.  LIMA LIMA Graft To Mid LAD LIMA and is very large.  The graft exhibits no disease.  Saphenous Graft To 2nd Mrg SVG graft was visualized by angiography and is large.  The graft exhibits severe focal disease. Origin to Prox Graft lesion is 70% stenosed. The lesion is concentric. The lesion was previously treated using a drug eluting stent over 2 years ago. Prox Graft-1 lesion is 25% stenosed. The lesion is eccentric. The lesion was previously treated . This did not appear to be as significant on initial imaging.->  Following ostial stent placement, but this appeared to be worse, therefore a second overlapping stent was placed to cover this lesion. Prox Graft-2 lesion is 60% stenosed. Vessel is not the culprit lesion. The lesion is segmental, eccentric and irregular. Prox Graft to Mid Graft lesion is 90% stenosed. Mid Graft lesion is 80% stenosed. Vessel is the culprit lesion. The lesion was previously treated using a drug eluting stent over 2 years ago. Focal ISR Previously placed stent displays restenosis. Dist Graft lesion is 45% stenosed. The lesion was previously treated .  Intervention  Origin to Prox Graft lesion (Saphenous Graft To 2nd Mrg) Angioplasty Scoring balloon angioplasty was performed using a BALLOON SCOREFLEX 3.0X10. Maximum pressure: 16 atm. Inflation time: 20 sec.  A second ballloon was used, using a standard  BALLOON SAPPHIRE 2.5X12. Maximum pressure:  14 atm. Inflation time:  20 sec. Post-Intervention Lesion Assessment The intervention was successful. Pre-interventional TIMI flow is 3. Post-intervention TIMI flow is 3. Treated lesion length:  16 mm. No complications occurred at this lesion. There is a 10% residual stenosis post intervention.  Prox Graft-1 lesion (Saphenous Graft To 2nd Mrg) Stent (Also treats lesions: Prox  Graft-2) Lesion length:  20 mm. CATH LAUNCHER 6FR AL1 guide catheter was inserted. Lesion crossed  with guidewire using a WIRE ASAHI PROWATER 180CM. Pre-stent angioplasty was performed using a BALLOON EMERGE MR 2.5X30. Maximum pressure:  12 atm. Inflation time: 20 sec. A drug-eluting stent was successfully placed using a STENT SYNERGY XD 3.0X24. Maximum pressure: 18 atm. Inflation time: 30 sec. Stent strut is well apposed. Deployed to 3.3 mm. Stent overlaps previously placed stent. Post-stent angioplasty was performed. Maximum pressure:  18 atm. Inflation time:  20 sec. Stent balloon used to post dilate all 4 overlapping stents. Total of 6 inflations with stent balloon for postdilation Post-Intervention Lesion Assessment The intervention was successful. Pre-interventional TIMI flow is 3. Post-intervention TIMI flow is 3. There is a 0% residual stenosis post intervention.  Prox Graft-2 lesion (Saphenous Graft To 2nd Mrg) Stent Lesion length:  20 mm. CATH LAUNCHER 6FR AL1 guide catheter was inserted. Lesion crossed with guidewire using a WIRE ASAHI PROWATER 180CM. Pre-stent angioplasty was performed using a BALLOON EMERGE MR 2.5X30. Maximum pressure:  12 atm. Inflation time: 20 sec. A drug-eluting stent was successfully placed using a STENT SYNERGY XD 3.0X24. Maximum pressure: 18 atm. Inflation time: 30 sec. Stent strut is well apposed. Stent overlaps previously placed stent. Post-stent angioplasty was performed. Maximum pressure:  18 atm. Inflation time:  20 sec. The same stent balloon was used to post dilate up and down the 2 new stents as well as the proximal and distal stents all the way to the ostium Stent (Also treats lesions: Prox Graft-1) See details in Prox Graft-1 lesion (Saphenous Graft To 2nd Mrg). Stent (Also treats lesions: Prox Graft to Mid Graft, and Mid Graft) Lesion length:  32 mm. CATH LAUNCHER 6FR AL1 guide catheter was inserted. Lesion crossed with guidewire using a WIRE ASAHI PROWATER  180CM. Pre-stent angioplasty was performed using a BALLOON SAPPHIRE 2.5X12. Maximum pressure:  12 atm. Inflation time: 20 sec.  A second angioplasty balloon was used, using a BALLOON EMERGE MR 2.5X30. Maximum pressure:  12 atm. Inflation time:  20 sec. A drug-eluting stent was successfully placed using a STENT SYNERGY XD 3.0X32. Maximum pressure: 16 atm. Inflation time: 30 sec. Stent strut is well apposed. Deployed 3.3 mm Stent overlaps previously placed stent. Post-stent angioplasty was performed. Maximum pressure:  18 atm. Inflation time:  20 sec. Stent balloon-high ATM Post-Intervention Lesion Assessment The intervention was successful. Pre-interventional TIMI flow is 3. Post-intervention TIMI flow is 3. Treated lesion length:  20 mm. No complications occurred at this lesion. There is a 0% residual stenosis post intervention.  Prox Graft to Mid Graft lesion (Saphenous Graft To 2nd Mrg) Stent (Also treats lesions: Prox Graft-2, and Mid Graft) See details in Prox Graft-2 lesion (Saphenous Graft To 2nd Mrg). Post-Intervention Lesion Assessment The intervention was successful. Pre-interventional TIMI flow is 3. Post-intervention TIMI flow is 3. Treated lesion length:  32 mm. No complications occurred at this lesion. There is a 0% residual stenosis post intervention.  Mid Graft lesion (Saphenous Graft To 2nd Mrg) Angioplasty Scoring balloon angioplasty was performed using a BALLOON SCOREFLEX 3.0X10. Maximum pressure: 16 atm. Inflation time: 20 sec.  A second ballloon was used, using a standard  BALLOON SAPPHIRE 2.5X12. Maximum pressure:  12 atm. Inflation time:  20 sec. Stent (Also treats lesions: Prox Graft-2, and Prox Graft to Mid Graft) See details in Prox Graft-2 lesion (Saphenous Graft To 2nd Mrg). Post-Intervention Lesion Assessment The intervention was successful. Pre-interventional TIMI flow is 3. Post-intervention TIMI flow is 3. Treated lesion length:  15 mm. There is a 10% residual  stenosis post  intervention.   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 Patent RCA stents with 30% in-stent restenosis in the mid/distal stent.  Proximal stent is widely patent. Widely patent LIMA-LAD. Patent SVG-OM with 50% ostial and 25% proximal in-stent restenosis.  There is a 95% stenosis at the proximal margin of the mid graft stent as well as a 40-50% lesion at the distal stent edge. Mildly elevated left ventricular filling pressure (LVEDP 18 mmHg) with normal systolic function (LVEF 55-65%). Probably moderate aortic valve stenosis (peak-to-peak gradient 20 mmHg). Successful PCI to 95% stenosis in mid portion of SVG-OM using Synergy 3.0 x 16 mm drug-eluting stent that overlaps the previously placed stent.  There is 0% residual stenosis with TIMI-3 flow following PCI.  Recommendations: Continue indefinite dual antiplatelet therapy with aspirin  and ticagrelor .  One could consider stopping aspirin  as soon as 3 months post-PCI if there is concern for high bleeding risk. Medical therapy of mild-moderate in-stent restenosis involving SVG-OM and mid/distal RCA. Aggressive secondary prevention of coronary artery disease. Anticipate same-day discharge if no post-PCI complications occur during 6 hour monitoring period.  Lonni Hanson, MD [facility]  Findings Coronary Findings Diagnostic  Dominance: Right  Left Main Mid LM to Prox LAD lesion is 95% stenosed with 90% stenosed side branch in Ost Cx to Prox Cx.  Left Anterior Descending Prox LAD to Mid LAD lesion is 100% stenosed with 99% stenosed side branch in 2nd Diag.  Ramus Intermedius Vessel is small.  Left Circumflex  First Obtuse Marginal Branch Vessel is small in size.  Second Obtuse Marginal Branch 2nd Mrg lesion is 90% stenosed.  Right  Coronary Artery Previously placed Prox RCA stent of unknown type is  widely patent. The lesion is located at the bend and eccentric. The lesion is calcified. Mid RCA to Dist RCA lesion is 30% stenosed. The lesion was previously treated using a bare metal stent and a drug eluting stent over 2 years ago.  Acute Marginal Branch Vessel is small in size.  Right Ventricular Branch Vessel is small in size.  First Right Posterolateral Branch Vessel is small in size.  Second Right Posterolateral Branch Vessel is moderate in size.  LIMA LIMA Graft To Mid LAD LIMA and is very large.  The graft exhibits no disease.  Saphenous Graft To 2nd Mrg SVG and is large. Origin to Prox Graft lesion is 50% stenosed. The lesion is concentric. The lesion was previously treated . Prox Graft lesion is 25% stenosed. The lesion is eccentric. The lesion was previously treated . This did not appear to be as significant on initial imaging.->  Following ostial stent placement, but this appeared to be worse, therefore a second overlapping stent was placed to cover this lesion. Mid Graft lesion is 95% stenosed. Vessel is the culprit lesion. The lesion was previously treated . Previously placed stent displays restenosis. Dist Graft lesion is 45% stenosed.  Intervention  Mid Graft lesion (Saphenous Graft To 2nd Mrg) Stent Lesion length:  15 mm. CATHETER LAUNCHER 6FR AL1 guide catheter was inserted. Lesion crossed with guidewire using a WIRE RUNTHROUGH .[id number]. Pre-stent angioplasty was performed using a BALLN SAPPHIRE 2.5X12. Maximum pressure:  6 atm. A drug-eluting stent was successfully placed using a SYNERGY XD 3.0X16. Maximum pressure: 16 atm. Stent overlaps previously placed stent. Post-stent angioplasty was not performed. Post-Intervention Lesion Assessment The intervention was successful. Embolic  protection device was not deployed. Pre-interventional TIMI flow is 2. Post-intervention TIMI flow is 3. No  complications occurred at this lesion. There is a 0% residual stenosis post intervention.     ECHOCARDIOGRAM  ECHOCARDIOGRAM COMPLETE 10/25/2023  Narrative ECHOCARDIOGRAM REPORT    Patient Name:   VISHAAL STROLLO [facility] Date of Exam: 10/25/2023 Medical Rec #:  [id number]        Height:       66.0 in Accession #:    [id number]       Weight:       139.6 lb Date of Birth:  12-18-1945        BSA:          1.716 m Patient Age:    77 years         BP:           125/90 mmHg Patient Gender: M                HR:           108 bpm. Exam Location:  Inpatient  Procedure: 2D Echo, Cardiac Doppler, Color Doppler and Intracardiac Opacification Agent (Both Spectral and Color Flow Doppler were utilized during procedure).  Indications:    Acute ischemic heart disease, unspecified I24.9 Aortic stenosis I35.0  History:        Patient has prior history of Echocardiogram examinations, most recent 02/16/2022. Previous Myocardial Infarction and Angina, Prior CABG, Aortic Valve Disease; Risk Factors:Dyslipidemia.  Sonographer:    Thea Norlander RCS Sonographer#2:  Juliene Rucks Referring Phys: [phone] DAVID W HARDING  IMPRESSIONS   1. There is no left ventricular thrombus (Definity  contrast used). Findings suggest infarction/scar versus severe resting ischemia in the mid-distal LAD artery distribution and ischemia/partial scar in the proximal LAD and in the entire right coronary artery distribution. Left ventricular ejection fraction, by estimation, is 25 to 30%. The left ventricle has moderate to severely decreased function. The left ventricle demonstrates regional wall motion abnormalities (see scoring diagram/findings for description). Left ventricular diastolic parameters are consistent with Grade II diastolic dysfunction (pseudonormalization). Elevated left atrial pressure. There is akinesis of the left ventricular, mid-apical anterolateral wall, anterior wall and anteroseptal wall. There is severe  hypokinesis of the left ventricular, basal anteroseptal wall and anterior wall. There is moderate hypokinesis of the left ventricular, basal-mid inferior wall and inferoseptal wall. 2. Right ventricular systolic function is normal. The right ventricular size is normal. There is normal pulmonary artery systolic pressure. The estimated right ventricular systolic pressure is 32.2 mmHg. 3. Left atrial size was moderately dilated. 4. The mitral valve is degenerative. Mild to moderate mitral valve regurgitation. No evidence of mitral stenosis. Moderate to severe mitral annular calcification. 5. Tricuspid valve regurgitation is mild to moderate. 6. The right and left aortic cusps are virtually immobile (fused?), while the posterior noncoronary cusp has almost normal mobility. Hemodynamics are consistet with moderate to severe low flow-low gradient aortic stenosis. The aortic valve has an indeterminant number of cusps. There is moderate calcification of the aortic valve. There is moderate thickening of the aortic valve. Aortic valve regurgitation is moderate. Moderate to severe aortic valve stenosis. Aortic valve mean gradient measures 17.7 mmHg. Aortic valve Vmax measures 2.81 m/s. Aortic valve acceleration time measures 95 msec. 7. The inferior vena cava is normal in size with greater than 50% respiratory variability, suggesting right atrial pressure of 3 mmHg.  Comparison(s): A prior study was performed on 2023 [facility]. Prior images unable to be directly viewed, comparison  made by report only. The left ventricular function is significantly worse. The left ventricular wall motion abnormalities are significantly worse. Aortic stenosis is worse and was underestimated on previous studies due to low stroke volume index.  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  FINDINGS Left Ventricle: There is no left ventricular thrombus (Definity  contrast used). Findings suggest infarction/scar versus severe resting ischemia in the mid-distal LAD artery distribution and ischemia/partial scar in the proximal LAD and in the entire right coronary artery distribution. Left ventricular ejection fraction, by estimation, is 25 to 30%. The left ventricle has moderate to severely decreased function. The left ventricle demonstrates regional wall motion abnormalities. Severe hypokinesis of the left ventricular, basal anteroseptal wall and anterior wall. Moderate hypokinesis of the left ventricular, basal-mid inferior wall and inferoseptal wall. Definity  contrast agent was given IV to delineate the left ventricular endocardial borders. The left ventricular internal cavity size was normal in size. There is no left ventricular hypertrophy. Left ventricular diastolic parameters are consistent with Grade II diastolic dysfunction (pseudonormalization). Elevated left atrial pressure.   LV Wall Scoring: The mid anteroseptal segment, apical lateral segment, mid anterolateral segment, apical anterior segment, apical inferior segment, and apex are akinetic. The anterior wall, inferior septum, inferior wall, and basal anteroseptal segment are hypokinetic. The posterior wall and basal anterolateral segment are normal.  Right Ventricle: The right ventricular size is normal. No increase in right ventricular wall thickness. Right ventricular systolic function is normal. There is normal pulmonary artery systolic pressure. The tricuspid regurgitant velocity is 2.70 m/s, and with an assumed right atrial pressure of 3 mmHg, the estimated right ventricular systolic pressure is 32.2 mmHg.  Left Atrium: Left atrial size was moderately dilated.  Right Atrium: Right atrial size was normal in size.  Pericardium: There is no evidence of pericardial  effusion.  Mitral Valve: The mitral valve is degenerative in appearance. Moderate to severe mitral annular calcification. Mild to moderate mitral valve regurgitation, with centrally-directed jet. No evidence of mitral valve stenosis.  Tricuspid Valve: The tricuspid valve is normal in structure. Tricuspid valve regurgitation is mild to moderate.  Aortic Valve: The right and left aortic cusps are virtually immobile (fused?), while the posterior noncoronary cusp has almost normal mobility. Hemodynamics are consistet with moderate to severe low flow-low gradient aortic stenosis. The aortic valve has an indeterminant number of cusps. There is moderate calcification of the aortic valve. There is moderate thickening of the aortic valve. Aortic valve regurgitation is moderate. Moderate to severe aortic stenosis is present. Aortic valve mean gradient measures 17.7 mmHg. Aortic valve peak gradient measures 31.7 mmHg. Aortic valve area, by VTI measures 0.91 cm.  Pulmonic Valve: The pulmonic valve was grossly normal. Pulmonic valve regurgitation is trivial. No evidence of pulmonic stenosis.  Aorta: The aortic root and ascending aorta are structurally normal, with no evidence of dilitation.  Venous: The inferior vena cava is normal in size with greater than 50% respiratory variability, suggesting right atrial pressure of 3 mmHg.  IAS/Shunts: No atrial level shunt detected by color flow Doppler.   LEFT VENTRICLE PLAX 2D LVIDd:         4.20 cm   Diastology LVIDs:         3.60 cm   LV e' medial:    5.44 cm/s LV PW:         1.10 cm  LV E/e' medial:  23.7 LV IVS:        1.00 cm   LV e' lateral:   7.94 cm/s LVOT diam:     2.00 cm   LV E/e' lateral: 16.2 LV SV:         44 LV SV Index:   26 LVOT Area:     3.14 cm   RIGHT VENTRICLE             IVC RV S prime:     12.90 cm/s  IVC diam: 1.60 cm TAPSE (M-mode): 1.8 cm  LEFT ATRIUM             Index        RIGHT ATRIUM           Index LA diam:         3.60 cm 2.10 cm/m   RA Area:     13.20 cm LA Vol (A2C):   63.5 ml 37.00 ml/m  RA Volume:   34.40 ml  20.04 ml/m LA Vol (A4C):   57.1 ml 33.27 ml/m LA Biplane Vol: 63.1 ml 36.76 ml/m AORTIC VALVE AV Area (Vmax):    0.93 cm AV Area (Vmean):   0.98 cm AV Area (VTI):     0.91 cm AV Vmax:           281.33 cm/s AV Vmean:          194.000 cm/s AV VTI:            0.486 m AV Peak Grad:      31.7 mmHg AV Mean Grad:      17.7 mmHg LVOT Vmax:         83.60 cm/s LVOT Vmean:        60.300 cm/s LVOT VTI:          0.141 m LVOT/AV VTI ratio: 0.29  AORTA Ao Root diam: 3.00 cm Ao Asc diam:  3.40 cm  MITRAL VALVE                TRICUSPID VALVE MV Area (PHT): 5.42 cm     TR Peak grad:   29.2 mmHg MV Decel Time: 140 msec     TR Vmax:        270.00 cm/s MV E velocity: 129.00 cm/s MV A velocity: 91.40 cm/s   SHUNTS MV E/A ratio:  1.41         Systemic VTI:  0.14 m Systemic Diam: 2.00 cm  Jerel Croitoru MD Electronically signed by Jerel Balding MD Signature Date/Time: 10/25/2023/10:03:35 AM    Final      CT SCANS  CT CORONARY MORPH W/CTA COR W/SCORE 11/02/2023  Addendum 11/02/2023  1:16 PM ADDENDUM REPORT: 11/02/2023 13:13  EXAM: OVER-READ INTERPRETATION CARDIAC CT CHEST  The following report is an over-read performed by radiologist Dr. Ryan Salvage of [facility] on 11/02/2023. This over-read does not include interpretation of cardiac or coronary anatomy or pathology. The cardiac interpretation by the cardiologist is attached or will be attached.  COMPARISON:  CT chest also from 11/02/2023  FINDINGS: Extracardiac Vascular: Atheromatous vascular calcification of the thoracic aorta and branch vessels.  Mediastinum: Numerous scattered upper normal sized mediastinal lymph nodes.  Lung: Stable appearance of interstitial accentuation with some faint patchy ground-glass opacities in the lung apices favoring low-level edema or alveolitis. Moderate bilateral  pleural effusions with passive atelectasis.  Included Upper Abdomen: Unremarkable  Musculoskeletal: Mild thoracic spondylosis. Multilevel Schmorl's nodes.  IMPRESSION: 1. Moderate bilateral  pleural effusions with passive atelectasis. 2. Stable appearance of interstitial accentuation with some faint patchy ground-glass opacities in the lung apices favoring low-level edema or alveolitis. 3. Numerous scattered upper normal sized mediastinal lymph nodes. 4. Mild thoracic spondylosis. 5.  Aortic Atherosclerosis (ICD10-I70.0).   Electronically Signed By: Ryan Salvage M.D. On: 11/02/2023 13:13  Narrative CLINICAL DATA:  89M with aortic stenosis being evaluated for a TAVR procedure.  EXAM: Cardiac TAVR CT  TECHNIQUE: A non-contrast, gated CT scan was obtained with axial slices of 2.5 mm through the heart for aortic valve scoring. A 120 kV retrospective, gated, contrast cardiac scan was obtained. Gantry rotation speed was 230 msec and collimation was 0.63 mm. Nitroglycerin  was not given. The 3D dataset was reconstructed in systole with motion correction. The 3D data set was reconstructed in 5% intervals of the 0-95% of the R-R cycle. Systolic and diastolic phases were analyzed on a dedicated workstation using MPR, MIP, and VRT modes. The patient received 100 cc of contrast.  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  Aortic valve calcium  score: 907  Aortic annulus:  Diameter: 27mm x 21mm  Perimeter: 75mm  Area: 437mm^2  Calcifications: No calcifications  Coronary height: Min Left - 9mm ; Min Right - 12mm  Sinotubular height: Left cusp - 16mm; Right cusp - 18mm; Noncoronary cusp - 21mm  LVOT (as measured 3 mm below the annulus):  Diameter: 29mm x 22mm  Area: 448mm^2  Calcifications: No calcifications  Aortic sinus width: Left cusp - 31mm; Right cusp - 28mm;  Noncoronary cusp - 32mm  Sinotubular junction width: 32mm x 30mm  Optimum Fluoroscopic Angle for Delivery: RAO 9 CAU 15  Cardiac:  Right atrium: Normal size  Right ventricle: Normal size  Pulmonary arteries: Normal size  Pulmonary veins: Normal configuration  Left atrium: Mild enlargement  Left ventricle: Normal size  Pericardium: Normal thickness  Coronary arteries: Normal origins. S/p CABG with patent LIMA-LAD and SVG-OM with multiple stents within SVG-OM  IMPRESSION: 1. 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 Mild to moderate AV calcifications (AV calcium  score 907)  2. Aortic annulus measures 27mm x 21mm in diameter with perimeter 75mm and area 411mm^2. No annular or LVOT calcifications. Annular measurements suitable for delivery of 23mm Edwards Sapien 3 valve  3. Low coronary height to left main, measuring 9mm. Sufficient coronary to annulus distance for RCA, measures 12mm  4.  Optimum Fluoroscopic Angle for Delivery:  RAO 9 CAU 15  5. S/p CABG with patent LIMA-LAD and SVG-OM with multiple stents within SVG-OM  Electronically Signed: By: Lonni Nanas M.D. On: 11/02/2023 13:06     ______________________________________________________________________________________________      Laboratory Data:  High Sensitivity Troponin:   Recent Labs  Lab 10/23/23 1620 10/23/23 1753 10/29/23 1545 10/29/23 1823 10/30/23 0041  TROPONINIHS 525* 520* 258* 255* 298*     Chemistry Recent Labs  Lab 10/31/23 0349 11/01/23 0303 11/02/23 0303  NA 136 136 136  K 4.1 3.9 3.8  CL 100 98 97*  CO2 23 25 25   GLUCOSE 158* 171* 135*  BUN 26* 28* 30*  CREATININE 1.30* 1.40* 1.49*  CALCIUM  9.1 9.0 9.0  GFRNONAA 56* 51* 47*  ANIONGAP 13 13 14     No results for input(s): PROT, ALBUMIN, AST, ALT, ALKPHOS, BILITOT in the last 168 hours. Hematology Recent Labs  Lab 10/31/23 [id number]  11/01/23 0303 11/02/23  0303  WBC 9.2 9.2 9.1  RBC 4.20* 4.11* 4.08*  HGB 11.8* 11.8* 11.6*  HCT 36.8* 35.5* 34.3*  MCV 87.6 86.4 84.1  MCH 28.1 28.7 28.4  MCHC 32.1 33.2 33.8  RDW 13.2 13.1 13.2  PLT 398 398 373   BNP Recent Labs  Lab 10/29/23 1930  BNP 1,155.5*    DDimer No results for input(s): DDIMER in the last 168 hours.   Radiology/Studies:  DG Chest 1 View Result Date: 11/02/2023 CLINICAL DATA:  Status post thoracentesis. EXAM: CHEST  1 VIEW COMPARISON:  Chest radiograph dated 10/31/2023 FINDINGS: Trace right pleural effusion. No pneumothorax. There is mild cardiomegaly and mild vascular congestion. Median sternotomy wires. No acute osseous pathology. IMPRESSION: Trace right pleural effusion. No pneumothorax. Electronically Signed   By: Vanetta Chou M.D.   On: 11/02/2023 14:22   IR THORACENTESIS ASP PLEURAL SPACE W/IMG GUIDE Result Date: 11/02/2023 INDICATION: 78 year old male with right pleural effusion. Therapeutic thoracentesis requested. EXAM: ULTRASOUND GUIDED RIGHT THORACENTESIS MEDICATIONS: 10 mL 1% lidocaine  COMPLICATIONS: None immediate. PROCEDURE: An ultrasound guided thoracentesis was thoroughly discussed with the patient and questions answered. The benefits, risks, alternatives and complications were also discussed. The patient understands and wishes to proceed with the procedure. Written consent was obtained. Ultrasound was performed to localize and mark an adequate pocket of fluid in the right chest. The area was then prepped and draped in the normal sterile fashion. 1% Lidocaine  was used for local anesthesia. Under ultrasound guidance a 6 Fr Safe-T-Centesis catheter was introduced. Thoracentesis was performed. The catheter was removed and a dressing applied. FINDINGS: A total of approximately 800 mL of dark yellow fluid was removed. Samples were sent to the laboratory as requested by the clinical team. IMPRESSION: Successful ultrasound guided right thoracentesis  yielding 800 mL of pleural fluid. Performed by: Kacie Matthews PA-C Electronically Signed   By: Ester Sides M.D.   On: 11/02/2023 14:05   CT CORONARY MORPH W/CTA COR W/SCORE W/CA W/CM &/OR WO/CM Addendum Date: 11/02/2023 ADDENDUM REPORT: 11/02/2023 13:13 EXAM: OVER-READ INTERPRETATION CARDIAC CT CHEST The following report is an over-read performed by radiologist Dr. Ryan Salvage of [facility] on 11/02/2023. This over-read does not include interpretation of cardiac or coronary anatomy or pathology. The cardiac interpretation by the cardiologist is attached or will be attached. COMPARISON:  CT chest also from 11/02/2023 FINDINGS: Extracardiac Vascular: Atheromatous vascular calcification of the thoracic aorta and branch vessels. Mediastinum: Numerous scattered upper normal sized mediastinal lymph nodes. Lung: Stable appearance of interstitial accentuation with some faint patchy ground-glass opacities in the lung apices favoring low-level edema or alveolitis. Moderate bilateral pleural effusions with passive atelectasis. Included Upper Abdomen: Unremarkable Musculoskeletal: Mild thoracic spondylosis. Multilevel Schmorl's nodes. IMPRESSION: 1. Moderate bilateral pleural effusions with passive atelectasis. 2. Stable appearance of interstitial accentuation with some faint patchy ground-glass opacities in the lung apices favoring low-level edema or alveolitis. 3. Numerous scattered upper normal sized mediastinal lymph nodes. 4. Mild thoracic spondylosis. 5.  Aortic Atherosclerosis (ICD10-I70.0). Electronically Signed   By: Ryan Salvage M.D.   On: 11/02/2023 13:13   Result Date: 11/02/2023 CLINICAL DATA:  89M with aortic stenosis being evaluated for a TAVR procedure. EXAM: Cardiac TAVR CT TECHNIQUE: A non-contrast, gated CT scan was obtained with axial slices of 2.5 mm through the heart for aortic valve scoring. A 120 kV retrospective, gated, contrast cardiac scan was obtained. Gantry rotation  speed was 230 msec and collimation was 0.63 mm. Nitroglycerin  was not given. The 3D dataset  was reconstructed in systole with motion correction. The 3D data set was reconstructed in 5% intervals of the 0-95% of the R-R cycle. Systolic and diastolic phases were analyzed on a dedicated workstation using MPR, MIP, and VRT modes. The patient received 100 cc of contrast. 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 Aortic valve calcium  score: 907 Aortic annulus: Diameter: 27mm x 21mm Perimeter: 75mm Area: 49mm^2 Calcifications: No calcifications Coronary height: Min Left - 9mm ; Min Right - 12mm Sinotubular height: Left cusp - 16mm; Right cusp - 18mm; Noncoronary cusp - 21mm LVOT (as measured 3 mm below the annulus): Diameter: 29mm x 22mm Area: 475mm^2 Calcifications: No calcifications Aortic sinus width: Left cusp - 31mm; Right cusp - 28mm; Noncoronary cusp - 32mm Sinotubular junction width: 32mm x 30mm Optimum Fluoroscopic Angle for Delivery: RAO 9 CAU 15 Cardiac: Right atrium: Normal size Right ventricle: Normal size Pulmonary arteries: Normal size Pulmonary veins: Normal configuration Left atrium: Mild enlargement Left ventricle: Normal size Pericardium: Normal thickness Coronary arteries: Normal origins. S/p CABG with patent LIMA-LAD and SVG-OM with multiple stents within SVG-OM IMPRESSION: 1. 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 Mild to moderate AV calcifications (AV calcium  score 907) 2. Aortic annulus measures 27mm x 21mm in diameter with perimeter 75mm and area 422mm^2. No annular or LVOT calcifications. Annular measurements suitable for delivery of 23mm Edwards Sapien 3 valve 3. Low coronary height to left main, measuring 9mm. Sufficient coronary to annulus distance for RCA, measures 12mm 4.  Optimum Fluoroscopic Angle for Delivery:  RAO 9 CAU 15 5. S/p CABG with  patent LIMA-LAD and SVG-OM with multiple stents within SVG-OM Electronically Signed: By: Lonni Nanas M.D. On: 11/02/2023 13:06   CT ANGIO CHEST AORTA W/CM & OR WO/CM Result Date: 11/02/2023 CLINICAL DATA:  Aortic valvular disease. Preoperative evaluation prior to aortic valve replacement. EXAM: CT ANGIOGRAPHY CHEST, ABDOMEN AND PELVIS TECHNIQUE: Non-contrast CT of the chest was initially obtained. Multidetector CT imaging through the chest, abdomen and pelvis was performed using the standard protocol during bolus administration of intravenous contrast. Multiplanar reconstructed images and MIPs were obtained and reviewed to evaluate the vascular anatomy. RADIATION DOSE REDUCTION: This exam was performed according to the departmental dose-optimization program which includes automated exposure control, adjustment of the mA and/or kV according to patient size and/or use of iterative reconstruction technique. CONTRAST:  OMNIPAQUE  IOHEXOL  350 MG/ML SOLN COMPARISON:  CT scan of the chest 10/23/2023 FINDINGS: CTA CHEST FINDINGS Cardiovascular: Conventional 3 vessel arch anatomy. Extensive heterogeneous atherosclerotic plaque throughout the aorta. The aortic valve is thickened and calcified. The aortic root is normal in caliber at 3.2 cm in maximal diameter measured at the sinuses of Valsalva. The ascending thoracic aorta is normal in caliber at 3.5 cm. The transverse and descending thoracic aorta are both normal in caliber. Patient is status post median sternotomy with evidence of prior multivessel CABG. Cardiomegaly is present. No pericardial effusion. Mediastinum/Nodes: Unremarkable CT appearance of the thyroid gland. No suspicious mediastinal or hilar adenopathy. No soft tissue mediastinal mass. The thoracic esophagus is unremarkable. Lungs/Pleura: Moderate to large layering right pleural effusion. A moderate layering left pleural effusion. Mild patchy ground-glass attenuation airspace opacity in the  central aspect of the upper lungs. Diffuse lower lobe bronchial wall thickening. Subpleural reticulation with architectural distortion and early honeycombing in the periphery of the lower lungs suggests early pulmonary fibrosis. No pneumothorax. Musculoskeletal:  No acute fracture or aggressive appearing lytic or blastic osseous lesion. Review of the MIP images confirms the above findings. CTA ABDOMEN AND PELVIS FINDINGS VASCULAR Aorta: Extensive fibrofatty atherosclerotic plaque throughout the abdominal aorta. No aneurysm or dissection. Celiac: Patent without evidence of aneurysm, dissection, vasculitis or significant stenosis. Lateral segmental branch of the left hepatic artery is replaced to the left gastric artery. SMA: Patent without evidence of aneurysm, dissection, vasculitis or significant stenosis. Renals: Right renal artery is patent. Atherosclerotic plaque results in perhaps mild stenosis at the vessel origin. No aneurysm, dissection or changes of FMD. Critical stenosis at the origin of the left renal artery. IMA: Patent without evidence of aneurysm, dissection, vasculitis or significant stenosis. Inflow: Minimal tortuosity. Extensive fibrofatty atherosclerotic plaque results in significant stenosis in the left common iliac artery. The arterial lumen measures as small as 4 mm. Further, there is a focal non flow limiting dissection flap likely due to a penetrating atherosclerotic ulceration more distally in the left common iliac artery. There is moderate stenosis at this location as well. The external iliac artery is relatively spared from disease. On the right, extensive plaque is present but the vessel remains widely patent. High-grade stenosis of the origin of the left internal iliac artery. The right internal iliac artery is widely patent. Veins: No focal venous abnormality. Review of the MIP images confirms the above findings. NON-VASCULAR Hepatobiliary: No focal liver abnormality is seen. No  gallstones, gallbladder wall thickening, or biliary dilatation. Pancreas: Unremarkable. No pancreatic ductal dilatation or surrounding inflammatory changes. Spleen: Normal in size without focal abnormality. Adrenals/Urinary Tract: Normal adrenal glands. No hydronephrosis, nephrolithiasis or enhancing renal mass. Relatively delayed perfusion of the left kidney as expected given left renal artery stenosis. Ureters and bladder are unremarkable. Stomach/Bowel: No focal bowel wall thickening or evidence of obstruction. Lymphatic: No suspicious lymphadenopathy. Reproductive: Prostate is unremarkable. Other: Small fat containing umbilical hernia. No evidence of ascites. Musculoskeletal: No acute fracture or malalignment. Healed median sternotomy. Extensive multilevel degenerative disc disease and bilateral facet arthropathy. Left hip arthroplasty prosthesis. Large cystic soft tissue collection in compass is the acetabular component and breaks through the medial wall of the pelvis. There is a narrow zone of transition. Slightly limited evaluation due to streak artifact from the hip arthroplasty prosthesis. Review of the MIP images confirms the above findings. IMPRESSION: VASCULAR 1. Thickened and calcified aortic valve consistent with the clinical history of aortic valvular disease. 2. Extensive mixed calcified and fibrofatty atherosclerotic plaque throughout the thoracoabdominal aorta without evidence of aneurysm or dissection. 3. Moderate stenoses of the left common and proximal external iliac arteries. 4. Right femoral and iliac arteries are widely patent and suitable for access. 5. Minimal tortuosity of the access vessels. 6. 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 NON VASCULAR 1. Abnormal circumscribed low-attenuation/cystic collection in golfing the acetabular component of the left hip arthroplasty prosthesis with adjacent osteolysis and erosion of the medial wall of the left acetabulum/pelvis.  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 No specific follow-up imaging is recommended unless the patient has clinical symptoms of left hip pain. 2. Moderate to large right and moderate left layering pleural effusions with associated atelectasis. 3. 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 4. 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 5. Small fat containing umbilical hernia. 6. Multilevel degenerative disc disease and lower lumbar facet arthropathy. Electronically Signed   By: Wilkie Lent M.D.   On: 11/02/2023 10:42   CT Angio Abd/Pel w/ and/or w/o  Result Date: 11/02/2023 CLINICAL DATA:  Aortic valvular disease. Preoperative evaluation prior to aortic valve replacement. EXAM: CT ANGIOGRAPHY CHEST, ABDOMEN AND PELVIS TECHNIQUE: Non-contrast CT of the chest was initially obtained. Multidetector CT imaging through the chest, abdomen and pelvis was performed using the standard protocol during bolus administration of intravenous contrast. Multiplanar reconstructed images and MIPs were obtained and reviewed to evaluate the vascular anatomy. RADIATION DOSE REDUCTION: This exam was performed according to the departmental dose-optimization program which includes automated exposure control, adjustment of the mA and/or kV according to patient size and/or use of iterative reconstruction technique. CONTRAST:  OMNIPAQUE  IOHEXOL  350 MG/ML SOLN COMPARISON:  CT scan of the chest 10/23/2023 FINDINGS: CTA CHEST FINDINGS Cardiovascular: Conventional 3 vessel arch anatomy. Extensive heterogeneous atherosclerotic plaque throughout the aorta. The aortic valve is thickened and calcified. The aortic root is normal in caliber at 3.2 cm in maximal diameter measured at the sinuses of Valsalva. The ascending thoracic aorta is normal in caliber at 3.5 cm. The transverse and descending thoracic aorta are both normal in caliber. Patient is status post median sternotomy with evidence of  prior multivessel CABG. Cardiomegaly is present. No pericardial effusion. Mediastinum/Nodes: Unremarkable CT appearance of the thyroid gland. No suspicious mediastinal or hilar adenopathy. No soft tissue mediastinal mass. The thoracic esophagus is unremarkable. Lungs/Pleura: Moderate to large layering right pleural effusion. A moderate layering left pleural effusion. Mild patchy ground-glass attenuation airspace opacity in the central aspect of the upper lungs. Diffuse lower lobe bronchial wall thickening. Subpleural reticulation with architectural distortion and early honeycombing in the periphery of the lower lungs suggests early pulmonary fibrosis. No pneumothorax. Musculoskeletal: No acute fracture or aggressive appearing lytic or blastic osseous lesion. Review of the MIP images confirms the above findings. CTA ABDOMEN AND PELVIS FINDINGS VASCULAR Aorta: Extensive fibrofatty atherosclerotic plaque throughout the abdominal aorta. No aneurysm or dissection. Celiac: Patent without evidence of aneurysm, dissection, vasculitis or significant stenosis. Lateral segmental branch of the left hepatic artery is replaced to the left gastric artery. SMA: Patent without evidence of aneurysm, dissection, vasculitis or significant stenosis. Renals: Right renal artery is patent. Atherosclerotic plaque results in perhaps mild stenosis at the vessel origin. No aneurysm, dissection or changes of FMD. Critical stenosis at the origin of the left renal artery. IMA: Patent without evidence of aneurysm, dissection, vasculitis or significant stenosis. Inflow: Minimal tortuosity. Extensive fibrofatty atherosclerotic plaque results in significant stenosis in the left common iliac artery. The arterial lumen measures as small as 4 mm. Further, there is a focal non flow limiting dissection flap likely due to a penetrating atherosclerotic ulceration more distally in the left common iliac artery. There is moderate stenosis at this location as  well. The external iliac artery is relatively spared from disease. On the right, extensive plaque is present but the vessel remains widely patent. High-grade stenosis of the origin of the left internal iliac artery. The right internal iliac artery is widely patent. Veins: No focal venous abnormality. Review of the MIP images confirms the above findings. NON-VASCULAR Hepatobiliary: No focal liver abnormality is seen. No gallstones, gallbladder wall thickening, or biliary dilatation. Pancreas: Unremarkable. No pancreatic ductal dilatation or surrounding inflammatory changes. Spleen: Normal in size without focal abnormality. Adrenals/Urinary Tract: Normal adrenal glands. No hydronephrosis, nephrolithiasis or enhancing renal mass. Relatively delayed perfusion of the left kidney as expected given left renal artery stenosis. Ureters and bladder are unremarkable. Stomach/Bowel: No focal bowel wall thickening or evidence of obstruction. Lymphatic: No suspicious lymphadenopathy. Reproductive: Prostate is unremarkable.  Other: Small fat containing umbilical hernia. No evidence of ascites. Musculoskeletal: No acute fracture or malalignment. Healed median sternotomy. Extensive multilevel degenerative disc disease and bilateral facet arthropathy. Left hip arthroplasty prosthesis. Large cystic soft tissue collection in compass is the acetabular component and breaks through the medial wall of the pelvis. There is a narrow zone of transition. Slightly limited evaluation due to streak artifact from the hip arthroplasty prosthesis. Review of the MIP images confirms the above findings. IMPRESSION: VASCULAR 1. Thickened and calcified aortic valve consistent with the clinical history of aortic valvular disease. 2. Extensive mixed calcified and fibrofatty atherosclerotic plaque throughout the thoracoabdominal aorta without evidence of aneurysm or dissection. 3. Moderate stenoses of the left common and proximal external iliac arteries. 4.  Right femoral and iliac arteries are widely patent and suitable for access. 5. Minimal tortuosity of the access vessels. 6. 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 NON VASCULAR 1. Abnormal circumscribed low-attenuation/cystic collection in golfing the acetabular component of the left hip arthroplasty prosthesis with adjacent osteolysis and erosion of the medial wall of the left acetabulum/pelvis. 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 No specific follow-up imaging is recommended unless the patient has clinical symptoms of left hip pain. 2. Moderate to large right and moderate left layering pleural effusions with associated atelectasis. 3. 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 4. 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 5. Small fat containing umbilical hernia. 6. Multilevel degenerative disc disease and lower lumbar facet arthropathy. Electronically Signed   By: Wilkie Lent M.D.   On: 11/02/2023 10:42   DG CHEST PORT 1 VIEW Result Date: 10/31/2023 CLINICAL DATA:  Breathing issues for 2 weeks EXAM: PORTABLE CHEST 1 VIEW COMPARISON:  10/29/2023, 03/29/2022 FINDINGS: Sternotomy and coronary stents. Mild cardiomegaly with vascular congestion and diffuse interstitial edema. Small right greater than left pleural effusions. No pneumothorax. IMPRESSION: Cardiomegaly with vascular congestion and diffuse interstitial edema. Small right greater than left pleural effusions. These findings do not appear significantly worsened compared with 10/29/2023 Electronically Signed   By: Luke Bun M.D.   On: 10/31/2023 17:57   DG Chest Portable 1 View Result Date: 10/29/2023 CLINICAL DATA:  Shortness of breath EXAM: PORTABLE CHEST 1 VIEW COMPARISON:  10/23/2023 FINDINGS: Prior CABG. Heart borderline in size. Small to moderate right pleural effusion. Diffuse interstitial and alveolar opacities have increased concerning for edema. No acute bony abnormality.  IMPRESSION: Diffuse interstitial and alveolar opacities, worsening since prior study concerning for edema. Small to moderate right pleural effusion. Electronically Signed   By: Franky Crease M.D.   On: 10/29/2023 17:35    STS Risk Calculator:    Procedure Type: Isolated AVR  PERIOPERATIVE OUTCOME ESTIMATE %  Operative Mortality 7.68%  Morbidity & Mortality 32.7%  Stroke 2.78%  Renal Failure 4.13%  Reoperation 8.45%  Prolonged Ventilation 22%  Deep Sternal Wound Infection 0.159%  Long Hospital Stay (>14 days) 26.2%  [facility] Stay (<6 days)* 15.9%    _____________________   [place] Cardiomyopathy Questionnaire      10/31/2023    1:50 PM  [facility]Q-12  1 a. Ability to shower/bathe Slightly limited  1 b. Ability to walk 1 block Extremely limited  1 c. Ability to hurry/jog Other, Did not do  2. Edema feet/ankles/legs Never over the past 2 weeks  3. Limited by fatigue All of the time  4. Limited by dyspnea All of the time  5. Sitting up / on 3+ pillows Every night  6.  Limited enjoyment of life Extremely limited  7. Rest of life w/ symptoms Not at all satisfied  8 a. Participation in hobbies Severely limited  8 b. Participation in chores Severely limited  8 c. Visiting family/friends Severely limited      ______________________________   Pre Surgical Assessment: 5 M Walk Test   36M=16.90ft   5 Meter Walk Test- trial 1: 8 seconds 5 Meter Walk Test- trial 2: 7.9 seconds 5 Meter Walk Test- trial 3: 8 seconds 5 Meter Walk Test Average: 8 seconds   Assessment and Plan:   Luke Harvey is a 78 y.o. 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   Cath 10/24/23 (done last week in the setting of NSTEMI) showed severe native LCA disease with occcluded LCX and LAD, patent RCA stents, patent LIMA to LAD, SVG-OM had 70% stenosis s/p successful PCI of vein graft with 2 overlapping stents.    Echo 10/25/23 showed LVEF 25-30%, LV  RWMAs, G2DD, mod-severe MAC with mild-mod MR/TR, moderate AI and severe LFLG AS: mean gradient 17.7 mmHg, V-max 2.81 m/s, [name] 0.93 cm, DVI 0.29, SVI 26.  The right and left aortic cusps were noted to be virtually immobile questioning fusion, while the posterior noncoronary cusp had near normal mobility. The regional wall motion abnormalities were not felt to correlate to his angiographic findings and felt to possibly indicate aortic stenosis as the cause of his cardiomyopathy.  Cardiac gated CTA of the heart revealed anatomical characteristics consistent with aortic stenosis suitable for treatment by transcatheter aortic valve replacement. 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 Left main height noted to be low at 9mm but pt has a functioning LIMA --> LAD. CTA of the aorta and iliac vessels demonstrated what appears to be adequate pelvic vascular access to facilitate a right transfemoral approach. However, there was extensive mixed calcified and fibrofatty atherosclerotic plaque throughout the thoracoabdominal aorta without evidence of aneurysm or dissection. Scan also showed mod-large right pleural effusion s/p thoracentesis yielding 800cc fluid. Brilinta  was converted to Plavix  in the case that it was contributing to his episodic dyspnea.    I have reviewed the natural history of aortic stenosis with the patient. We have discussed the limitations of medical therapy and the poor prognosis associated with symptomatic aortic stenosis. We have reviewed potential treatment options, including palliative medical therapy, conventional surgical aortic valve replacement, and transcatheter aortic valve replacement. We discussed treatment options in the context of this patient's specific comorbid medical conditions.    The patient's predicted risk of mortality with conventional aortic valve replacement is 7.68%  primarily based on age, LV dysfunction, extensive CAD with previous CABG with recent NSTEMI and intervention and acute CHF. Other significant comorbid conditions include frailty.   Plan TAVR-Right TF with 23mm Celestia MOLDER on Friday at 7:30am with Dr. Shyrl and Dr. Wonda. He would not be a bailout candidate.     Signed, Lamarr Hummer, PA-C  11/02/2023 3:26 PM  Agree with above This is a 78yo male with severe aortic stenosis.  He has a history of previous CABG to the LAD and OM.  Both grafts remain patent.  On review of his imaging, one area of concern is a low left main ostium, but he is protected with his patent bypasses.  He has good acces for a right transfemoral TAVR with a 23mm  S3UR.  Given his previous sternotomy, he would not be a candidate for bailout.  The risks and benefits of a transfemoral TAVR were discussed in detail.  Devanie Galanti MALVA Rayas

## 2023-11-02 NOTE — Plan of Care (Signed)

## 2023-11-02 NOTE — Progress Notes (Addendum)
 Advanced Heart Failure Rounding Note  Cardiologist: Lonni LITTIE Nanas, MD  Chief Complaint:  Subjective:    Feels better today w/ diuresis and after stopping Brilinta . 1.4L in UOP yesterday. Dyspnea much improved.  Just returned from getting CTs done. Feels a little dizzy this AM. BP soft 90s systolic. Hasn't had breakfast yet.   Objective:   Weight Range: 61.7 kg Body mass index is 21.63 kg/m.   Vital Signs:   Temp:  [97.6 F (36.4 C)-98.7 F (37.1 C)] 98.7 F (37.1 C) (07/09 0412) Pulse Rate:  [87-99] 87 (07/09 0412) Resp:  [12-20] 20 (07/09 0809) BP: (91-105)/(53-75) 95/53 (07/09 0809) SpO2:  [92 %-100 %] 98 % (07/09 0412) Weight:  [61.7 kg] 61.7 kg (07/09 0412) Last BM Date : 10/31/23  Weight change: Filed Weights   10/31/23 0323 11/01/23 0413 11/02/23 0412  Weight: 63 kg 63.4 kg 61.7 kg    Intake/Output:   Intake/Output Summary (Last 24 hours) at 11/02/2023 0855 Last data filed at 11/02/2023 0840 Gross per 24 hour  Intake 237 ml  Output 1575 ml  Net -1338 ml      Physical Exam   General:  Well appearing elderly male. No respiratory difficulty HEENT: normal Neck: supple. no JVD. Carotids 2+ bilat; no bruits. No lymphadenopathy or thyromegaly appreciated. Cor: PMI nondisplaced. Regular rate & rhythm. 1/6 SEM Lungs: clear Abdomen: soft, nontender, nondistended. No hepatosplenomegaly. No bruits or masses. Good bowel sounds. Extremities: no cyanosis, clubbing, rash, edema Neuro: alert & oriented x 3, cranial nerves grossly intact. moves all 4 extremities w/o difficulty. Affect pleasant.    Telemetry   NSR 90s   EKG    N/A  Labs    CBC Recent Labs    11/01/23 0303 11/02/23 0303  WBC 9.2 9.1  HGB 11.8* 11.6*  HCT 35.5* 34.3*  MCV 86.4 84.1  PLT 398 373   Basic Metabolic Panel Recent Labs    92/91/74 0303 11/01/23 1123 11/02/23 0303  NA 136  --  136  K 3.9  --  3.8  CL 98  --  97*  CO2 25  --  25  GLUCOSE 171*  --  135*  BUN  28*  --  30*  CREATININE 1.40*  --  1.49*  CALCIUM  9.0  --  9.0  MG  --  2.4  --    Liver Function Tests No results for input(s): AST, ALT, ALKPHOS, BILITOT, PROT, ALBUMIN in the last 72 hours. No results for input(s): LIPASE, AMYLASE in the last 72 hours. Cardiac Enzymes No results for input(s): CKTOTAL, CKMB, CKMBINDEX, TROPONINI in the last 72 hours.  BNP: BNP (last 3 results) Recent Labs    10/23/23 1730 10/29/23 1930  BNP 873.7* 1,155.5*    ProBNP (last 3 results) No results for input(s): PROBNP in the last 8760 hours.   D-Dimer No results for input(s): DDIMER in the last 72 hours. Hemoglobin A1C No results for input(s): HGBA1C in the last 72 hours. Fasting Lipid Panel No results for input(s): CHOL, HDL, LDLCALC, TRIG, CHOLHDL, LDLDIRECT in the last 72 hours. Thyroid Function Tests No results for input(s): TSH, T4TOTAL, T3FREE, THYROIDAB in the last 72 hours.  Invalid input(s): FREET3  Other results:   Imaging    No results found.   Medications:     Scheduled Medications:  aspirin  EC  81 mg Oral Q1200   clopidogrel   75 mg Oral Daily   DULoxetine   30 mg Oral QHS   heparin   5,000  Units Subcutaneous Q8H   insulin  aspart  0-5 Units Subcutaneous QHS   insulin  aspart  0-9 Units Subcutaneous TID WC   isosorbide  mononitrate  30 mg Oral Daily   levothyroxine   75 mcg Oral Q0600   loratadine   10 mg Oral Q1200   metoprolol  succinate  25 mg Oral BID   rosuvastatin   40 mg Oral Daily   sodium chloride  flush  3 mL Intravenous Q12H   spironolactone   12.5 mg Oral Daily    Infusions:   PRN Medications: acetaminophen , artificial tears, fluticasone , ondansetron  (ZOFRAN ) IV, sodium chloride  flush    Patient Profile   Luke Harvey is a 78 y.o. 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  HF to see for acute systolic heart failure.     Assessment/Plan   1. Acute on chronic systolic CHF: Echo in 7/25 with EF 25-30% with regional WMAs, normal RV, mild-moderate MR, mild-moderate TR, moderate-severe low flow/low gradient AS with mean gradient 18 mmHg and [name] 0.91 cm^2. With wall motion abnormalities, I suspect this is primarily ischemic cardiomyopathy.  He has moderate-severe low flow/low gradient AS that is not critical but likely contributes to CHF.  On exam, he does not look particularly volume overloaded.  REDS clip is borderline at 35% and CXR showed vascular congestion. He has felt better with IV Lasix . His spells of dyspnea are not totally clear to me, ?reaction to ticagrelor  or possibly triggered by episodes of atrial tachycardia (short runs of tachy noted this admission, ?AT vs sinus tachy). Symptoms do seem out of proportion to CHF. Breathing much improved today w/ diuresis and after stopping Brilinta . Appears euvolemic on exam today  - Hold am dose of spironolactone  12.5 and Toprol  XL given soft BP and dizziness currently  - Jardiance  on hold for TAVR.  2. Aortic stenosis: Moderate-severe LF/LG AS on 7/25 echo.  Not critical AS but think that it contributes to his dyspnea.   - TAVR CT scans today   - Plan TAVR later this week.  3. CKD stage 3: Creatinine stable w/ diuresis, 1.4 today.  4. CAD: S/p CABG in 2014 with LIMA-LAD and SVG-OM.  NSTEMI in 6/25 with 80-90% stenoses in SVG-OM, now s/p DES.  No chest pain.  - Continue Crestor  - Ticagrelor  discontinued. Felt contributing to dyspnea. Now on Plavix  75 mg daily  5. SVT: Patient has periodic episdoes SVT.  ?Sinus tachycardia versus atrial tachycardia. These episodes may be related to his spells of dyspnea.  - continue on increased dose of Toprol  XL, 25 mg bid. Will wait to see if BP improves after breakfast to given am dose    Length of Stay: [address], PA-C  11/02/2023, 8:55 AM  Advanced Heart Failure Team Pager [phone] (M-F; 7a - 5p)  Please  contact [facility] for night-coverage after hours (5p -7a ) and weekends on [email or web address]  Patient seen with PA, I formulated the plan and agree with the above note.   He is breathing better today.  Weight down 3 lbs with diuresis.  Creatinine fairly stable 1.4 => 1.49.  Some dizziness this morning with SBP 90s.   He did not have any episodes of tachycardia last night.   General: NAD Neck: No JVD, no thyromegaly or thyroid nodule.  Lungs: Clear to auscultation bilaterally with normal respiratory effort. CV: Nondisplaced PMI.  Heart regular S1/S2, no S3/S4, 2/6 SEM RUSB.  No peripheral edema.  Abdomen: Soft, nontender, no hepatosplenomegaly, no distention.  Skin: Intact without lesions or rashes.  Neurologic: Alert and oriented x 3.  Psych: Normal affect. Extremities: No clubbing or cyanosis.  HEENT: Normal.   Breathing is improved.  Hard to tell if this is because we diuresed him or stopped Brilinta  (or because he had no runs of ?atrial tachycardia). He now looks euvolemic. Creatinine stable at 1.49.  - Will stay off Brilinta  and on Plavix .  - Would stop diuretics for now.  If creatinine is stable tomorrow, can start on Lasix  40 mg po daily.  - Hold spironolactone  today with SBP 90s, reassess tomorrow to restart.  - Continue Toprol  XL 25 mg bid, can hold am dose with soft BP.   Will be getting TAVR scans today with plan for TAVR tentatively Friday.   Luke Harvey 11/02/2023 10:24 AM

## 2023-11-03 LAB — HEMOGLOBIN A1C
Hgb A1c MFr Bld: 7.6 % — ABNORMAL HIGH (ref 4.8–5.6)
Mean Plasma Glucose: 171 mg/dL

## 2023-11-03 LAB — BASIC METABOLIC PANEL WITH GFR
Anion gap: 12 (ref 5–15)
BUN: 28 mg/dL — ABNORMAL HIGH (ref 8–23)
CO2: 26 mmol/L (ref 22–32)
Calcium: 9.3 mg/dL (ref 8.9–10.3)
Chloride: 98 mmol/L (ref 98–111)
Creatinine, Ser: 1.55 mg/dL — ABNORMAL HIGH (ref 0.61–1.24)
GFR, Estimated: 45 mL/min — ABNORMAL LOW (ref >60)
Glucose, Bld: 176 mg/dL — ABNORMAL HIGH (ref 70–99)
Potassium: 3.7 mmol/L (ref 3.5–5.1)
Sodium: 136 mmol/L (ref 135–145)

## 2023-11-03 LAB — MAGNESIUM: Magnesium: 2.2 mg/dL (ref 1.7–2.4)

## 2023-11-03 LAB — COMPREHENSIVE METABOLIC PANEL WITH GFR
ALT: 19 U/L (ref 0–44)
AST: 24 U/L (ref 15–41)
Albumin: 3.1 g/dL — ABNORMAL LOW (ref 3.5–5.0)
Alkaline Phosphatase: 82 U/L (ref 38–126)
Anion gap: 12 (ref 5–15)
BUN: 32 mg/dL — ABNORMAL HIGH (ref 8–23)
CO2: 23 mmol/L (ref 22–32)
Calcium: 9.3 mg/dL (ref 8.9–10.3)
Chloride: 99 mmol/L (ref 98–111)
Creatinine, Ser: 1.48 mg/dL — ABNORMAL HIGH (ref 0.61–1.24)
GFR, Estimated: 48 mL/min — ABNORMAL LOW (ref >60)
Glucose, Bld: 123 mg/dL — ABNORMAL HIGH (ref 70–99)
Potassium: 4.3 mmol/L (ref 3.5–5.1)
Sodium: 134 mmol/L — ABNORMAL LOW (ref 135–145)
Total Bilirubin: 0.6 mg/dL (ref 0.0–1.2)
Total Protein: 7 g/dL (ref 6.5–8.1)

## 2023-11-03 LAB — URINALYSIS, ROUTINE W REFLEX MICROSCOPIC
Bacteria, UA: NONE SEEN
Bilirubin Urine: NEGATIVE
Glucose, UA: >=500 mg/dL — AB
Hgb urine dipstick: NEGATIVE
Ketones, ur: NEGATIVE mg/dL
Leukocytes,Ua: NEGATIVE
Nitrite: NEGATIVE
Protein, ur: 30 mg/dL — AB
Specific Gravity, Urine: 1.026 (ref 1.005–1.030)
pH: 6 (ref 5.0–8.0)

## 2023-11-03 LAB — APTT: aPTT: 46 s — ABNORMAL HIGH (ref 24–36)

## 2023-11-03 LAB — BLOOD GAS, ARTERIAL
Acid-Base Excess: 4.6 mmol/L — ABNORMAL HIGH (ref 0.0–2.0)
Bicarbonate: 28.3 mmol/L — ABNORMAL HIGH (ref 20.0–28.0)
O2 Saturation: 95.7 %
Patient temperature: 36.9
pCO2 arterial: 38 mmHg (ref 32–48)
pH, Arterial: 7.48 — ABNORMAL HIGH (ref 7.35–7.45)
pO2, Arterial: 71 mmHg — ABNORMAL LOW (ref 83–108)

## 2023-11-03 LAB — TYPE AND SCREEN
ABO/RH(D): A NEG
Antibody Screen: NEGATIVE

## 2023-11-03 LAB — GLUCOSE, CAPILLARY
Glucose-Capillary: 156 mg/dL — ABNORMAL HIGH (ref 70–99)
Glucose-Capillary: 300 mg/dL — ABNORMAL HIGH (ref 70–99)
Glucose-Capillary: 106 mg/dL — ABNORMAL HIGH (ref 70–99)
Glucose-Capillary: 237 mg/dL — ABNORMAL HIGH (ref 70–99)

## 2023-11-03 LAB — PROTIME-INR
INR: 1.1 (ref 0.8–1.2)
Prothrombin Time: 14.8 s (ref 11.4–15.2)

## 2023-11-03 LAB — ABO/RH: ABO/RH(D): A NEG

## 2023-11-03 MED ORDER — CHLORHEXIDINE GLUCONATE 4 % EX SOLN
1.0000 | Freq: Once | CUTANEOUS | Status: AC
  Administered 2023-11-04: 1 via TOPICAL
  Filled 2023-11-03: qty 15

## 2023-11-03 MED ORDER — BISACODYL 5 MG PO TBEC
5.0000 mg | DELAYED_RELEASE_TABLET | Freq: Once | ORAL | Status: AC
  Administered 2023-11-03: 5 mg via ORAL
  Filled 2023-11-03: qty 1

## 2023-11-03 MED ORDER — MAGNESIUM SULFATE 50 % IJ SOLN
40.0000 meq | INTRAMUSCULAR | Status: DC
  Filled 2023-11-03: qty 9.85

## 2023-11-03 MED ORDER — SPIRONOLACTONE 12.5 MG HALF TABLET
12.5000 mg | ORAL_TABLET | Freq: Every day | ORAL | Status: DC
  Administered 2023-11-03 – 2023-11-08 (×5): 12.5 mg via ORAL
  Filled 2023-11-03 (×5): qty 1

## 2023-11-03 MED ORDER — HEPARIN 30,000 UNITS/1000 ML (OHS) CELLSAVER SOLUTION
Status: DC
  Filled 2023-11-03: qty 1000

## 2023-11-03 MED ORDER — POTASSIUM CHLORIDE CRYS ER 20 MEQ PO TBCR
40.0000 meq | EXTENDED_RELEASE_TABLET | Freq: Once | ORAL | Status: AC
  Administered 2023-11-03: 40 meq via ORAL
  Filled 2023-11-03: qty 2

## 2023-11-03 MED ORDER — CEFAZOLIN SODIUM-DEXTROSE 2-4 GM/100ML-% IV SOLN
2.0000 g | INTRAVENOUS | Status: AC
  Administered 2023-11-04: 2 g via INTRAVENOUS
  Filled 2023-11-03: qty 100

## 2023-11-03 MED ORDER — NOREPINEPHRINE 4 MG/250ML-% IV SOLN
0.0000 ug/min | INTRAVENOUS | Status: AC
  Administered 2023-11-04: 2 ug/min via INTRAVENOUS
  Filled 2023-11-03: qty 250

## 2023-11-03 MED ORDER — POTASSIUM CHLORIDE 2 MEQ/ML IV SOLN
80.0000 meq | INTRAVENOUS | Status: DC
  Filled 2023-11-03: qty 40

## 2023-11-03 MED ORDER — DEXMEDETOMIDINE HCL IN NACL 400 MCG/100ML IV SOLN
0.1000 ug/kg/h | INTRAVENOUS | Status: AC
  Administered 2023-11-04: 31.52 ug via INTRAVENOUS
  Administered 2023-11-04: .5 ug/kg/h via INTRAVENOUS
  Administered 2023-11-04: 2 ug via INTRAVENOUS
  Filled 2023-11-03: qty 100

## 2023-11-03 MED ORDER — INSULIN ASPART 100 UNIT/ML IJ SOLN
0.0000 [IU] | Freq: Three times a day (TID) | INTRAMUSCULAR | Status: DC
  Administered 2023-11-04: 3 [IU] via SUBCUTANEOUS
  Administered 2023-11-05: 5 [IU] via SUBCUTANEOUS
  Administered 2023-11-05: 2 [IU] via SUBCUTANEOUS
  Administered 2023-11-05: 3 [IU] via SUBCUTANEOUS
  Administered 2023-11-06: 5 [IU] via SUBCUTANEOUS
  Administered 2023-11-06 – 2023-11-08 (×5): 3 [IU] via SUBCUTANEOUS

## 2023-11-03 MED ORDER — FUROSEMIDE 20 MG PO TABS
20.0000 mg | ORAL_TABLET | Freq: Every day | ORAL | Status: DC
  Administered 2023-11-03 – 2023-11-06 (×3): 20 mg via ORAL
  Filled 2023-11-03 (×3): qty 1

## 2023-11-03 MED ORDER — TEMAZEPAM 15 MG PO CAPS
15.0000 mg | ORAL_CAPSULE | Freq: Once | ORAL | Status: AC | PRN
  Administered 2023-11-04: 15 mg via ORAL
  Filled 2023-11-03: qty 1

## 2023-11-03 MED ORDER — INSULIN ASPART 100 UNIT/ML IJ SOLN: 0.0000 [IU] | Freq: Three times a day (TID) | INTRAMUSCULAR | Status: DC

## 2023-11-03 MED ORDER — CHLORHEXIDINE GLUCONATE 0.12 % MT SOLN
15.0000 mL | Freq: Once | OROMUCOSAL | Status: AC
  Administered 2023-11-04: 15 mL via OROMUCOSAL
  Filled 2023-11-03: qty 15

## 2023-11-03 NOTE — TOC Progression Note (Signed)
 Transition of Care [facility]) - Progression Note    Patient Details  Name: Luke Harvey MRN: [id number] Date of Birth: [birth year]  Transition of Care [facility] CM/SW Contact  Justina Delcia Czar, RN Phone Number: [phone] 11/03/2023, 12:44 PM  Clinical Narrative:    Spoke to pt at bedside. Pt reports having a scale at home. Provided pt with Living Better with HF. Educated pt on daily weight and low sodium heart healthy. Pt has cane and RW at home. Dtr, [name] takes pt to appts. Gave permission to speak to dtr. States he believes dtr is working on [employer], [facility] with [facility] to assist him at home.   Will continue to follow for dc needs.    Expected Discharge Plan: Home w Home Health Services Barriers to Discharge: Continued Medical Work up  Expected Discharge Plan and Services In-house Referral: NA Discharge Planning Services: CM Consult Post Acute Care Choice: NA Living arrangements for the past 2 months: Single Family Home                   DME Agency: NA       HH Arranged: NA           Social Determinants of Health (SDOH) Interventions SDOH Screenings   Food Insecurity: No Food Insecurity (10/29/2023)  Housing: Low Risk  (10/29/2023)  Transportation Needs: No Transportation Needs (10/29/2023)  Utilities: Not At Risk (10/29/2023)  Depression (PHQ2-9): Low Risk  (07/16/2020)  Social Connections: Unknown (10/30/2023)  Tobacco Use: Medium Risk (10/29/2023)    Readmission Risk Interventions    11/02/2023   12:38 PM  Readmission Risk Prevention Plan  Transportation Screening Complete  PCP or Specialist Appt within 3-5 Days Complete  HRI or Home Care Consult Complete  Palliative Care Screening Not Applicable  Medication Review (RN Care Manager) Complete

## 2023-11-03 NOTE — Progress Notes (Signed)
 PA changed patient's sliding scale in[place]lin to moderate. No scale for before dinner insulin  fsbs coverage. PA placed order for new moderate sliding scale coverage to start 7/11/ 2025 at 0700.Message sent to PA.

## 2023-11-03 NOTE — Progress Notes (Signed)
 Mobility Specialist Progress Note:    11/03/23 1533  Mobility  Activity Ambulated with assistance in hallway;Transferred from bed to chair  Level of Assistance Standby assist, set-up cues, supervision of patient - no hands on  Assistive Device Cane  Distance Ambulated (ft) 215 ft  Activity Response Tolerated well  Mobility Referral Yes  Mobility visit 1 Mobility  Mobility Specialist Start Time (ACUTE ONLY) 1516  Mobility Specialist Stop Time (ACUTE ONLY) 1529  Mobility Specialist Time Calculation (min) (ACUTE ONLY) 13 min   Received pt in chair and agreeable to mobility. No physical assistance needed. C/o weakness, otherwise tolerated well. Returned to room without fault. Pt left in bed with alarm on. Personal belongings and call light within reach. All needs met.  Lavanda Pollack Mobility Specialist  Please contact via [employer] or  Rehab Office [phone]

## 2023-11-03 NOTE — Progress Notes (Addendum)
 Advanced Heart Failure Rounding Note  Cardiologist: Lonni LITTIE Nanas, MD  Chief Complaint:  Subjective:    Feels good this morning. A little SOB after washing up and getting to the chair for breakfast.   Objective:   Weight Range: 63 kg Body mass index is 22.08 kg/m.   Vital Signs:   Temp:  [97.5 F (36.4 C)-98 F (36.7 C)] 98 F (36.7 C) (07/10 0414) Pulse Rate:  [86-95] 91 (07/10 0414) Resp:  [14-30] 20 (07/10 0414) BP: (90-119)/(53-69) 99/63 (07/10 0414) SpO2:  [94 %-100 %] 98 % (07/10 0414) Weight:  [63 kg] 63 kg (07/10 0414) Last BM Date : 10/31/23  Weight change: Filed Weights   11/01/23 0413 11/02/23 0412 11/03/23 0414  Weight: 63.4 kg 61.7 kg 63 kg   Intake/Output:   Intake/Output Summary (Last 24 hours) at 11/03/2023 0800 Last data filed at 11/03/2023 0413 Gross per 24 hour  Intake 237 ml  Output 800 ml  Net -563 ml    Physical Exam   General:  elderly appearing.  No respiratory difficulty Neck: supple. JVD flat.  Cor: PMI nondisplaced. Regular rate & rhythm. No rubs, gallops or murmurs. Lungs: clear, diminished bases Extremities: no cyanosis, clubbing, rash, edema  Neuro: alert & oriented x 3. Moves all 4 extremities w/o difficulty. Affect pleasant.   Telemetry   NSR 90s (Personally reviewed)    EKG    N/A  Labs    CBC Recent Labs    11/01/23 0303 11/02/23 0303  WBC 9.2 9.1  HGB 11.8* 11.6*  HCT 35.5* 34.3*  MCV 86.4 84.1  PLT 398 373   Basic Metabolic Panel Recent Labs    92/91/74 1123 11/02/23 0303 11/03/23 0310 11/03/23 0313  NA  --  136  --  136  K  --  3.8  --  3.7  CL  --  97*  --  98  CO2  --  25  --  26  GLUCOSE  --  135*  --  176*  BUN  --  30*  --  28*  CREATININE  --  1.49*  --  1.55*  CALCIUM   --  9.0  --  9.3  MG 2.4  --  2.2  --    Liver Function Tests No results for input(s): AST, ALT, ALKPHOS, BILITOT, PROT, ALBUMIN in the last 72 hours. No results for input(s): LIPASE, AMYLASE  in the last 72 hours. Cardiac Enzymes No results for input(s): CKTOTAL, CKMB, CKMBINDEX, TROPONINI in the last 72 hours.  BNP: BNP (last 3 results) Recent Labs    10/23/23 1730 10/29/23 1930  BNP 873.7* 1,155.5*    ProBNP (last 3 results) No results for input(s): PROBNP in the last 8760 hours.   D-Dimer No results for input(s): DDIMER in the last 72 hours. Hemoglobin A1C No results for input(s): HGBA1C in the last 72 hours. Fasting Lipid Panel No results for input(s): CHOL, HDL, LDLCALC, TRIG, CHOLHDL, LDLDIRECT in the last 72 hours. Thyroid Function Tests No results for input(s): TSH, T4TOTAL, T3FREE, THYROIDAB in the last 72 hours.  Invalid input(s): FREET3  Other results:  Imaging   DG Chest 1 View Result Date: 11/02/2023 CLINICAL DATA:  Status post thoracentesis. EXAM: CHEST  1 VIEW COMPARISON:  Chest radiograph dated 10/31/2023 FINDINGS: Trace right pleural effusion. No pneumothorax. There is mild cardiomegaly and mild vascular congestion. Median sternotomy wires. No acute osseous pathology. IMPRESSION: Trace right pleural effusion. No pneumothorax. Electronically Signed   By: Vanetta  Radparvar M.D.   On: 11/02/2023 14:22   IR THORACENTESIS ASP PLEURAL SPACE W/IMG GUIDE Result Date: 11/02/2023 INDICATION: 78 year old male with right pleural effusion. Therapeutic thoracentesis requested. EXAM: ULTRASOUND GUIDED RIGHT THORACENTESIS MEDICATIONS: 10 mL 1% lidocaine  COMPLICATIONS: None immediate. PROCEDURE: An ultrasound guided thoracentesis was thoroughly discussed with the patient and questions answered. The benefits, risks, alternatives and complications were also discussed. The patient understands and wishes to proceed with the procedure. Written consent was obtained. Ultrasound was performed to localize and mark an adequate pocket of fluid in the right chest. The area was then prepped and draped in the normal sterile fashion. 1% Lidocaine  was  used for local anesthesia. Under ultrasound guidance a 6 Fr Safe-T-Centesis catheter was introduced. Thoracentesis was performed. The catheter was removed and a dressing applied. FINDINGS: A total of approximately 800 mL of dark yellow fluid was removed. Samples were sent to the laboratory as requested by the clinical team. IMPRESSION: Successful ultrasound guided right thoracentesis yielding 800 mL of pleural fluid. Performed by: Kacie Matthews PA-C Electronically Signed   By: Ester Sides M.D.   On: 11/02/2023 14:05   CT CORONARY MORPH W/CTA COR W/SCORE W/CA W/CM &/OR WO/CM Addendum Date: 11/02/2023 ADDENDUM REPORT: 11/02/2023 13:13 EXAM: OVER-READ INTERPRETATION CARDIAC CT CHEST The following report is an over-read performed by radiologist Dr. Ryan Salvage of [facility] on 11/02/2023. This over-read does not include interpretation of cardiac or coronary anatomy or pathology. The cardiac interpretation by the cardiologist is attached or will be attached. COMPARISON:  CT chest also from 11/02/2023 FINDINGS: Extracardiac Vascular: Atheromatous vascular calcification of the thoracic aorta and branch vessels. Mediastinum: Numerous scattered upper normal sized mediastinal lymph nodes. Lung: Stable appearance of interstitial accentuation with some faint patchy ground-glass opacities in the lung apices favoring low-level edema or alveolitis. Moderate bilateral pleural effusions with passive atelectasis. Included Upper Abdomen: Unremarkable Musculoskeletal: Mild thoracic spondylosis. Multilevel Schmorl's nodes. IMPRESSION: 1. Moderate bilateral pleural effusions with passive atelectasis. 2. Stable appearance of interstitial accentuation with some faint patchy ground-glass opacities in the lung apices favoring low-level edema or alveolitis. 3. Numerous scattered upper normal sized mediastinal lymph nodes. 4. Mild thoracic spondylosis. 5.  Aortic Atherosclerosis (ICD10-I70.0). Electronically Signed   By:  Ryan Salvage M.D.   On: 11/02/2023 13:13   Result Date: 11/02/2023 CLINICAL DATA:  72M with aortic stenosis being evaluated for a TAVR procedure. EXAM: Cardiac TAVR CT TECHNIQUE: A non-contrast, gated CT scan was obtained with axial slices of 2.5 mm through the heart for aortic valve scoring. A 120 kV retrospective, gated, contrast cardiac scan was obtained. Gantry rotation speed was 230 msec and collimation was 0.63 mm. Nitroglycerin  was not given. The 3D dataset was reconstructed in systole with motion correction. The 3D data set was reconstructed in 5% intervals of the 0-95% of the R-R cycle. Systolic and diastolic phases were analyzed on a dedicated workstation using MPR, MIP, and VRT modes. The patient received 100 cc of contrast. 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 Aortic valve calcium  score: 907 Aortic annulus: Diameter: 27mm x 21mm Perimeter: 75mm Area: 468mm^2 Calcifications: No calcifications Coronary height: Min Left - 9mm ; Min Right - 12mm Sinotubular height: Left cusp - 16mm; Right cusp - 18mm; Noncoronary cusp - 21mm LVOT (as measured 3 mm below the annulus): Diameter: 29mm x 22mm Area: 417mm^2 Calcifications: No calcifications Aortic sinus width: Left cusp - 31mm; Right cusp - 28mm;  Noncoronary cusp - 32mm Sinotubular junction width: 32mm x 30mm Optimum Fluoroscopic Angle for Delivery: RAO 9 CAU 15 Cardiac: Right atrium: Normal size Right ventricle: Normal size Pulmonary arteries: Normal size Pulmonary veins: Normal configuration Left atrium: Mild enlargement Left ventricle: Normal size Pericardium: Normal thickness Coronary arteries: Normal origins. S/p CABG with patent LIMA-LAD and SVG-OM with multiple stents within SVG-OM IMPRESSION: 1. 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 Mild to moderate AV calcifications (AV calcium  score 907) 2.  Aortic annulus measures 27mm x 21mm in diameter with perimeter 75mm and area 464mm^2. No annular or LVOT calcifications. Annular measurements suitable for delivery of 23mm Edwards Sapien 3 valve 3. Low coronary height to left main, measuring 9mm. Sufficient coronary to annulus distance for RCA, measures 12mm 4.  Optimum Fluoroscopic Angle for Delivery:  RAO 9 CAU 15 5. S/p CABG with patent LIMA-LAD and SVG-OM with multiple stents within SVG-OM Electronically Signed: By: Lonni Nanas M.D. On: 11/02/2023 13:06   CT ANGIO CHEST AORTA W/CM & OR WO/CM Result Date: 11/02/2023 CLINICAL DATA:  Aortic valvular disease. Preoperative evaluation prior to aortic valve replacement. EXAM: CT ANGIOGRAPHY CHEST, ABDOMEN AND PELVIS TECHNIQUE: Non-contrast CT of the chest was initially obtained. Multidetector CT imaging through the chest, abdomen and pelvis was performed using the standard protocol during bolus administration of intravenous contrast. Multiplanar reconstructed images and MIPs were obtained and reviewed to evaluate the vascular anatomy. RADIATION DOSE REDUCTION: This exam was performed according to the departmental dose-optimization program which includes automated exposure control, adjustment of the mA and/or kV according to patient size and/or use of iterative reconstruction technique. CONTRAST:  OMNIPAQUE  IOHEXOL  350 MG/ML SOLN COMPARISON:  CT scan of the chest 10/23/2023 FINDINGS: CTA CHEST FINDINGS Cardiovascular: Conventional 3 vessel arch anatomy. Extensive heterogeneous atherosclerotic plaque throughout the aorta. The aortic valve is thickened and calcified. The aortic root is normal in caliber at 3.2 cm in maximal diameter measured at the sinuses of Valsalva. The ascending thoracic aorta is normal in caliber at 3.5 cm. The transverse and descending thoracic aorta are both normal in caliber. Patient is status post median sternotomy with evidence of prior multivessel CABG. Cardiomegaly is  present. No pericardial effusion. Mediastinum/Nodes: Unremarkable CT appearance of the thyroid gland. No suspicious mediastinal or hilar adenopathy. No soft tissue mediastinal mass. The thoracic esophagus is unremarkable. Lungs/Pleura: Moderate to large layering right pleural effusion. A moderate layering left pleural effusion. Mild patchy ground-glass attenuation airspace opacity in the central aspect of the upper lungs. Diffuse lower lobe bronchial wall thickening. Subpleural reticulation with architectural distortion and early honeycombing in the periphery of the lower lungs suggests early pulmonary fibrosis. No pneumothorax. Musculoskeletal: No acute fracture or aggressive appearing lytic or blastic osseous lesion. Review of the MIP images confirms the above findings. CTA ABDOMEN AND PELVIS FINDINGS VASCULAR Aorta: Extensive fibrofatty atherosclerotic plaque throughout the abdominal aorta. No aneurysm or dissection. Celiac: Patent without evidence of aneurysm, dissection, vasculitis or significant stenosis. Lateral segmental branch of the left hepatic artery is replaced to the left gastric artery. SMA: Patent without evidence of aneurysm, dissection, vasculitis or significant stenosis. Renals: Right renal artery is patent. Atherosclerotic plaque results in perhaps mild stenosis at the vessel origin. No aneurysm, dissection or changes of FMD. Critical stenosis at the origin of the left renal artery. IMA: Patent without evidence of aneurysm, dissection, vasculitis or significant stenosis. Inflow: Minimal tortuosity. Extensive fibrofatty atherosclerotic plaque results in significant stenosis in the left common  iliac artery. The arterial lumen measures as small as 4 mm. Further, there is a focal non flow limiting dissection flap likely due to a penetrating atherosclerotic ulceration more distally in the left common iliac artery. There is moderate stenosis at this location as well. The external iliac artery is  relatively spared from disease. On the right, extensive plaque is present but the vessel remains widely patent. High-grade stenosis of the origin of the left internal iliac artery. The right internal iliac artery is widely patent. Veins: No focal venous abnormality. Review of the MIP images confirms the above findings. NON-VASCULAR Hepatobiliary: No focal liver abnormality is seen. No gallstones, gallbladder wall thickening, or biliary dilatation. Pancreas: Unremarkable. No pancreatic ductal dilatation or surrounding inflammatory changes. Spleen: Normal in size without focal abnormality. Adrenals/Urinary Tract: Normal adrenal glands. No hydronephrosis, nephrolithiasis or enhancing renal mass. Relatively delayed perfusion of the left kidney as expected given left renal artery stenosis. Ureters and bladder are unremarkable. Stomach/Bowel: No focal bowel wall thickening or evidence of obstruction. Lymphatic: No suspicious lymphadenopathy. Reproductive: Prostate is unremarkable. Other: Small fat containing umbilical hernia. No evidence of ascites. Musculoskeletal: No acute fracture or malalignment. Healed median sternotomy. Extensive multilevel degenerative disc disease and bilateral facet arthropathy. Left hip arthroplasty prosthesis. Large cystic soft tissue collection in compass is the acetabular component and breaks through the medial wall of the pelvis. There is a narrow zone of transition. Slightly limited evaluation due to streak artifact from the hip arthroplasty prosthesis. Review of the MIP images confirms the above findings. IMPRESSION: VASCULAR 1. Thickened and calcified aortic valve consistent with the clinical history of aortic valvular disease. 2. Extensive mixed calcified and fibrofatty atherosclerotic plaque throughout the thoracoabdominal aorta without evidence of aneurysm or dissection. 3. Moderate stenoses of the left common and proximal external iliac arteries. 4. Right femoral and iliac arteries  are widely patent and suitable for access. 5. Minimal tortuosity of the access vessels. 6. 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 NON VASCULAR 1. Abnormal circumscribed low-attenuation/cystic collection in golfing the acetabular component of the left hip arthroplasty prosthesis with adjacent osteolysis and erosion of the medial wall of the left acetabulum/pelvis. 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 No specific follow-up imaging is recommended unless the patient has clinical symptoms of left hip pain. 2. Moderate to large right and moderate left layering pleural effusions with associated atelectasis. 3. 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 4. 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 5. Small fat containing umbilical hernia. 6. Multilevel degenerative disc disease and lower lumbar facet arthropathy. Electronically Signed   By: Wilkie Lent M.D.   On: 11/02/2023 10:42   CT Angio Abd/Pel w/ and/or w/o Result Date: 11/02/2023 CLINICAL DATA:  Aortic valvular disease. Preoperative evaluation prior to aortic valve replacement. EXAM: CT ANGIOGRAPHY CHEST, ABDOMEN AND PELVIS TECHNIQUE: Non-contrast CT of the chest was initially obtained. Multidetector CT imaging through the chest, abdomen and pelvis was performed using the standard protocol during bolus administration of intravenous contrast. Multiplanar reconstructed images and MIPs were obtained and reviewed to evaluate the vascular anatomy. RADIATION DOSE REDUCTION: This exam was performed according to the departmental dose-optimization program which includes automated exposure control, adjustment of the mA and/or kV according to patient size and/or use of iterative reconstruction technique. CONTRAST:  OMNIPAQUE  IOHEXOL  350 MG/ML SOLN COMPARISON:  CT scan of the chest 10/23/2023 FINDINGS: CTA CHEST FINDINGS Cardiovascular: Conventional 3 vessel arch anatomy. Extensive heterogeneous  atherosclerotic plaque throughout the aorta. The  aortic valve is thickened and calcified. The aortic root is normal in caliber at 3.2 cm in maximal diameter measured at the sinuses of Valsalva. The ascending thoracic aorta is normal in caliber at 3.5 cm. The transverse and descending thoracic aorta are both normal in caliber. Patient is status post median sternotomy with evidence of prior multivessel CABG. Cardiomegaly is present. No pericardial effusion. Mediastinum/Nodes: Unremarkable CT appearance of the thyroid gland. No suspicious mediastinal or hilar adenopathy. No soft tissue mediastinal mass. The thoracic esophagus is unremarkable. Lungs/Pleura: Moderate to large layering right pleural effusion. A moderate layering left pleural effusion. Mild patchy ground-glass attenuation airspace opacity in the central aspect of the upper lungs. Diffuse lower lobe bronchial wall thickening. Subpleural reticulation with architectural distortion and early honeycombing in the periphery of the lower lungs suggests early pulmonary fibrosis. No pneumothorax. Musculoskeletal: No acute fracture or aggressive appearing lytic or blastic osseous lesion. Review of the MIP images confirms the above findings. CTA ABDOMEN AND PELVIS FINDINGS VASCULAR Aorta: Extensive fibrofatty atherosclerotic plaque throughout the abdominal aorta. No aneurysm or dissection. Celiac: Patent without evidence of aneurysm, dissection, vasculitis or significant stenosis. Lateral segmental branch of the left hepatic artery is replaced to the left gastric artery. SMA: Patent without evidence of aneurysm, dissection, vasculitis or significant stenosis. Renals: Right renal artery is patent. Atherosclerotic plaque results in perhaps mild stenosis at the vessel origin. No aneurysm, dissection or changes of FMD. Critical stenosis at the origin of the left renal artery. IMA: Patent without evidence of aneurysm, dissection, vasculitis or significant stenosis. Inflow:  Minimal tortuosity. Extensive fibrofatty atherosclerotic plaque results in significant stenosis in the left common iliac artery. The arterial lumen measures as small as 4 mm. Further, there is a focal non flow limiting dissection flap likely due to a penetrating atherosclerotic ulceration more distally in the left common iliac artery. There is moderate stenosis at this location as well. The external iliac artery is relatively spared from disease. On the right, extensive plaque is present but the vessel remains widely patent. High-grade stenosis of the origin of the left internal iliac artery. The right internal iliac artery is widely patent. Veins: No focal venous abnormality. Review of the MIP images confirms the above findings. NON-VASCULAR Hepatobiliary: No focal liver abnormality is seen. No gallstones, gallbladder wall thickening, or biliary dilatation. Pancreas: Unremarkable. No pancreatic ductal dilatation or surrounding inflammatory changes. Spleen: Normal in size without focal abnormality. Adrenals/Urinary Tract: Normal adrenal glands. No hydronephrosis, nephrolithiasis or enhancing renal mass. Relatively delayed perfusion of the left kidney as expected given left renal artery stenosis. Ureters and bladder are unremarkable. Stomach/Bowel: No focal bowel wall thickening or evidence of obstruction. Lymphatic: No suspicious lymphadenopathy. Reproductive: Prostate is unremarkable. Other: Small fat containing umbilical hernia. No evidence of ascites. Musculoskeletal: No acute fracture or malalignment. Healed median sternotomy. Extensive multilevel degenerative disc disease and bilateral facet arthropathy. Left hip arthroplasty prosthesis. Large cystic soft tissue collection in compass is the acetabular component and breaks through the medial wall of the pelvis. There is a narrow zone of transition. Slightly limited evaluation due to streak artifact from the hip arthroplasty prosthesis. Review of the MIP images  confirms the above findings. IMPRESSION: VASCULAR 1. Thickened and calcified aortic valve consistent with the clinical history of aortic valvular disease. 2. Extensive mixed calcified and fibrofatty atherosclerotic plaque throughout the thoracoabdominal aorta without evidence of aneurysm or dissection. 3. Moderate stenoses of the left common and proximal external iliac arteries. 4. Right femoral and iliac arteries are widely  patent and suitable for access. 5. Minimal tortuosity of the access vessels. 6. 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 NON VASCULAR 1. Abnormal circumscribed low-attenuation/cystic collection in golfing the acetabular component of the left hip arthroplasty prosthesis with adjacent osteolysis and erosion of the medial wall of the left acetabulum/pelvis. 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 No specific follow-up imaging is recommended unless the patient has clinical symptoms of left hip pain. 2. Moderate to large right and moderate left layering pleural effusions with associated atelectasis. 3. 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 4. 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 5. Small fat containing umbilical hernia. 6. Multilevel degenerative disc disease and lower lumbar facet arthropathy. Electronically Signed   By: Wilkie Lent M.D.   On: 11/02/2023 10:42    Medications:   Scheduled Medications:  aspirin  EC  81 mg Oral Q1200   clopidogrel   75 mg Oral Daily   DULoxetine   30 mg Oral QHS   heparin   5,000 Units Subcutaneous Q8H   insulin  aspart  0-5 Units Subcutaneous QHS   insulin  aspart  0-9 Units Subcutaneous TID WC   levothyroxine   75 mcg Oral Q0600   loratadine   10 mg Oral Q1200   metoprolol  succinate  25 mg Oral QHS   potassium chloride   40 mEq Oral Once   rosuvastatin   40 mg Oral Daily   sodium chloride  flush  3 mL Intravenous Q12H    Infusions:   PRN Medications: acetaminophen , artificial  tears, fluticasone , ondansetron  (ZOFRAN ) IV, sodium chloride  flush Patient Profile  Luke Harvey is a 78 y.o. 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  HF to see for acute systolic heart failure.   Assessment/Plan   1. Acute on chronic systolic CHF: Echo in 7/25 with EF 25-30% with regional WMAs, normal RV, mild-moderate MR, mild-moderate TR, moderate-severe low flow/low gradient AS with mean gradient 18 mmHg and [name] 0.91 cm^2. With wall motion abnormalities, I suspect this is primarily ischemic cardiomyopathy.  He has moderate-severe low flow/low gradient AS that is not critical but likely contributes to CHF.  On exam, he does not look particularly volume overloaded.  REDS clip is borderline at 35% and CXR showed vascular congestion. He has felt better with IV Lasix . His spells of dyspnea are not totally clear to me, ?reaction to ticagrelor  or possibly triggered by episodes of atrial tachycardia (short runs of tachy noted this admission, ?AT vs sinus tachy). Symptoms do seem out of proportion to CHF. Breathing improved post diuresis and after stopping Brilinta . Appears euvolemic on exam today  - Hold spironolactone  12.5 given soft BP - Continue toprol  XL QHS - Jardiance  on hold for TAVR.  2. Aortic stenosis: Moderate-severe LF/LG AS on 7/25 echo.  Not critical AS but think that it contributes to his dyspnea.   - TAVR CT scans complete - Plan TAVR Friday 3. CKD stage 3: Creatinine stable w/ diuresis, 1.5 today.  4. CAD: S/p CABG in 2014 with LIMA-LAD and SVG-OM.  NSTEMI in 6/25 with 80-90% stenoses in SVG-OM, now s/p DES.  No chest pain.  - Continue Crestor  - Ticagrelor  discontinued. Felt contributing to dyspnea. Now on Plavix  75 mg daily  5. SVT: Patient has periodic episdoes SVT.  ?Sinus tachycardia versus atrial tachycardia. These episodes may be related to his spells of dyspnea.  - continue Toprol  XL, 25 mg QHS.  6.  Pleural effusions:  Suspect due to CHF.  S/p right thoracentesis, fluid not sent for analysis.    Length of Stay: 5  Beckey LITTIE Coe, NP  11/03/2023, 8:00 AM  Advanced Heart Failure Team Pager [phone] (M-F; 7a - 5p)  Please contact [facility] for night-coverage after hours (5p -7a ) and weekends on [email or web address]  Patient seen with NP, I formulated the plan and agree with the above note.   He had a right thoracentesis yesterday with 800 cc off.  Pleural fluid not sent for analysis.    Breathing overall better.  Planned for TAVR tomorrow.  Creatinine stable 1.49 => 1.55.   General: NAD Neck: No JVD, no thyromegaly or thyroid nodule.  Lungs: Mildly decreased BS at bases.  CV: Nondisplaced PMI.  Heart regular S1/S2, no S3/S4, soft 1/6 SEM RUSB.  No peripheral edema.   Abdomen: Soft, nontender, no hepatosplenomegaly, no distention.  Skin: Intact without lesions or rashes.  Neurologic: Alert and oriented x 3.  Psych: Normal affect. Extremities: No clubbing or cyanosis.  HEENT: Normal.   No arrhythmias (atrial tachycardia) overnight.  Continue Toprol  XL.   Breathing overall better, ?due to diuresis versus stopping Brilinta .  He does not look significantly volume overloaded on exam.  He does have bilateral pleural effusions likely due to CHF and now s/p right thoracentesis. AS also likely plays a role in dyspnea.  - Would start him on Lasix  20 mg daily for now.  - Restart Jardiance  after TAVR.   Plan for TAVR tomorrow for low flow/low gradient moderate-severe AS.   Ezra Shuck 11/03/2023 9:19 AM

## 2023-11-03 NOTE — Anesthesia Preprocedure Evaluation (Addendum)
 Anesthesia Evaluation  Patient identified by MRN, date of birth, ID band Patient awake    Reviewed: Allergy & Precautions, NPO status , Patient's Chart, lab work & pertinent test results  Airway Mallampati: III  TM Distance: >3 FB Neck ROM: Limited    Dental  (+) Teeth Intact, Dental Advisory Given   Pulmonary sleep apnea , pneumonia, resolved, former smoker   Pulmonary exam normal breath sounds clear to auscultation       Cardiovascular hypertension, Pt. on home beta blockers and Pt. on medications + angina  + CAD, + Past MI, + Cardiac Stents, + CABG and + Peripheral Vascular Disease  + Valvular Problems/Murmurs (Mild mod TR) AI, AS and MR  Rhythm:Regular Rate:Normal + Systolic murmurs Echo 10/2023  1. There is no left ventricular thrombus (Definity  contrast used). Findings suggest infarction/scar versus severe resting ischemia in the mid-distal LAD artery distribution and ischemia/partial scar in the proximal LAD and in the entire right coronary artery distribution. Left ventricular ejection fraction, by estimation, is 25 to 30%. The left ventricle has moderate to severely decreased function. The left ventricle demonstrates regional wall motion abnormalities (see scoring diagram/findings for description). Left ventricular diastolic parameters are consistent with Grade II diastolic dysfunction (pseudonormalization). Elevated left atrial pressure. There is akinesis of the left ventricular, mid-apical anterolateral wall, anterior wall and anteroseptal wall. There is severe hypokinesis of the left ventricular, basal anteroseptal wall and anterior wall. There is moderate hypokinesis of the left ventricular, basal-mid inferior wall and inferoseptal wall.   2. Right ventricular systolic function is normal. The right ventricular size is normal. There is normal pulmonary artery systolic pressure. The estimated right ventricular systolic pressure is 32.2  mmHg.   3. Left atrial size was moderately dilated.   4. The mitral valve is degenerative. Mild to moderate mitral valve regurgitation. No evidence of mitral stenosis. Moderate to severe mitral annular calcification.   5. Tricuspid valve regurgitation is mild to moderate.   6. The right and left aortic cusps are virtually immobile (fused?), while the posterior noncoronary cusp has almost normal mobility. Hemodynamics are consistet with moderate to severe low flow-low gradient aortic stenosis. The aortic valve has an indeterminant number of cusps. There is moderate calcification of the aortic valve. There is moderate thickening of the aortic valve. Aortic valve regurgitation is moderate. Moderate to severe aortic valve stenosis. Aortic valve mean gradient measures 17.7 mmHg. Aortic valve Vmax measures 2.81 m/s. Aortic valve acceleration time measures 95 msec.   7. The inferior vena cava is normal in size with greater than 50% respiratory variability, suggesting right atrial pressure of 3 mmHg.   Comparison(s): A prior study was performed on 2023 [facility]. Prior images unable to be directly viewed, comparison made by report only. The left ventricular function is significantly worse. The left ventricular wall motion abnormalities are significantly worse. Aortic stenosis is worse and was underestimated on previous studies due to low stroke volume index.   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     Neuro/Psych  PSYCHIATRIC DISORDERS Anxiety      Neuromuscular disease    GI/Hepatic hiatal hernia,GERD  Controlled,,(+) Hepatitis -  Endo/Other  diabetes    Renal/GU negative Renal ROS     Musculoskeletal  (+) Arthritis ,    Abdominal   Peds  Hematology negative hematology ROS (+)   Anesthesia  Other Findings   Reproductive/Obstetrics                              Anesthesia Physical Anesthesia Plan  ASA: 4  Anesthesia Plan: MAC   Post-op Pain Management: Minimal or no pain anticipated   Induction: Intravenous  PONV Risk Score and Plan: Ondansetron  and Treatment may vary due to age or medical condition  Airway Management Planned: Natural Airway  Additional Equipment:   Intra-op Plan:   Post-operative Plan:   Informed Consent: I have reviewed the patients History and Physical, chart, labs and discussed the procedure including the risks, benefits and alternatives for the proposed anesthesia with the patient or authorized representative who has indicated his/her understanding and acceptance.     Dental advisory given  Plan Discussed with: CRNA  Anesthesia Plan Comments:          Anesthesia Quick Evaluation

## 2023-11-03 NOTE — H&P (View-Only) (Signed)
 [facility]   MULTIDISCIPLINARY HEART VALVE TEAM  Patient Name: Luke Harvey Date of Encounter: 11/03/2023  Admit date: 10/29/2023  Primary Care Provider: [facility], [facility] Cardiologist: Luke LITTIE Nanas, MD  [facility] Electrophysiologist:  None   Luke [facility] Problem List     Principal Problem:   Heart failure Luke [facility]) Active Problems:   Nonrheumatic aortic (valve) stenosis     Subjective   Up brushing his teeth with OT. Still having SOB but improved from previous.   Inpatient Medications    Scheduled Meds:  aspirin  EC  81 mg Oral Q1200   clopidogrel   75 mg Oral Daily   DULoxetine   30 mg Oral QHS   heparin   5,000 Units Subcutaneous Q8H   insulin  aspart  0-5 Units Subcutaneous QHS   insulin  aspart  0-9 Units Subcutaneous TID WC   levothyroxine   75 mcg Oral Q0600   loratadine   10 mg Oral Q1200   metoprolol  succinate  25 mg Oral QHS   potassium chloride   40 mEq Oral Once   rosuvastatin   40 mg Oral Daily   sodium chloride  flush  Harvey mL Intravenous Q12H   spironolactone   12.5 mg Oral Daily   Continuous Infusions:  PRN Meds: acetaminophen , artificial tears, fluticasone , ondansetron  (ZOFRAN ) IV, sodium chloride  flush   Vital Signs    Vitals:   11/02/23 1935 11/02/23 2339 11/03/23 0414 11/03/23 0737  BP: 119/61 108/69 99/63 114/66  Pulse: 95 95 91   Resp: 14 20 20 18   Temp: 97.6 F (36.4 C) 97.7 F (36.5 C) 98 F (36.7 C) 98 F (36.7 C)  TempSrc: Oral Oral Oral Oral  SpO2: 100% 96% 98% 98%  Weight:   63 kg   Height:        Intake/Output Summary (Last 24 hours) at 11/03/2023 0836 Last data filed at 11/03/2023 0413 Gross per 24 hour  Intake 237 ml  Output 800 ml  Net -563 ml   Filed Weights   11/01/23 0413 11/02/23 0412 11/03/23 0414  Weight: 63.4 kg 61.7 kg 63 kg    Physical Exam    GEN: Well nourished, well developed, in no acute distress.  HEENT: Grossly normal.  Neck: Supple, no JVD, carotid bruits, or  masses. Cardiac: RRR, 1/6 systolic murmur heard best at LUSB. No rubs, or gallops Respiratory:  Decreased breath sounds at bases GI: Soft, nontender, nondistended, BS + x 4. MS: no deformity or atrophy. Skin: warm and dry, no rash. Neuro:  Strength and sensation are intact. Psych: AAOx3.  Normal affect.  Labs    CBC Recent Labs    11/01/23 0303 11/02/23 0303  WBC 9.2 9.1  HGB 11.8* 11.6*  HCT 35.5* 34.Harvey*  MCV 86.4 84.1  PLT 398 373   Basic Metabolic Panel Recent Labs    92/91/74 1123 11/02/23 0303 11/03/23 0310 11/03/23 0313  NA  --  136  --  136  K  --  Harvey.8  --  Harvey.7  CL  --  97*  --  98  CO2  --  25  --  26  GLUCOSE  --  135*  --  176*  BUN  --  30*  --  28*  CREATININE  --  1.49*  --  1.55*  CALCIUM   --  9.0  --  9.Harvey  MG 2.4  --  2.2  --    Telemetry    Sinus, HRs 90s  - Personally Reviewed  ECG  Sinus, IVCD, HR 86 - Personally Reviewed  Patient Profile     Luke [facility] is a 78 y.o. male with a hx of CAD s/p CABG x2V (2014), multiple PCI's, CKD stage IIIa, ICM w/ EF of 25 to 30%, HLD, T2DM, and LFLG AS who is being seen today for the evaluation of severe AS at the request of Dr. Debera.   Assessment & Plan    Severe LFLG AS: -- Plan TAVR work up.  -- CTs completed and tentative TAVR-TF on Friday morning with Dr. Wonda and Dr. Lightfoot once seen in formal consultation by Dr. Shyrl.   Multivessel CAD: -- Status post CABG in 2014 with LIMA to LAD and SVG to circumflex/OM as well as DES to RCA.  Subsequently underwent PCI to SVG to OM in 2023 and just recently DES x 2 to SVG to OM on June 30 in the setting of NSTEMI.  LIMA to LAD patent with otherwise severe underlying native CAD.   -- High-sensitivity troponin I levels remain elevated in the 200s, but in flat pattern not suggestive of ACS.  -- Continue Aspirin  81mg  daily.  -- Brilinta  discontinued given atypical episodic dyspnea.  -- Loaded with Plavix  300mg  x1 and started on Plavix  75mg   daily.   -- Hold imdur  30mg  daily given symptomatic hypotension.  -- Continue Crestor  40mg  daily.  -- No chest pain.    Acute HFrEF: -- EF 35%. -- CXR with pulm vasc congestion and pleural effusions.  -- Treated with IV Lasix . Net negative Harvey.9L with improvement in breathing. -- Will resume spironolactone  12.5mg  daily with improvement in BP. -- Holding home Jardiance  with possible inpatient TAVR.    CKD stage IIIa: -- Creat stable at 1.5 -- Continue to monitor.   DMT2: -- Continue SSI   Atrial tachycardia: -- Seem to correlate with SOB attacks. -- Continue Toprol  XL 25mg  at bedtime  Pleural effusions: -- CTA showed moderate to large right and moderate left layering pleural effusions with associated atelectasis. -- S/p IR guided R thoracentesis 11/02/23 with taken off.   Luke Lamarr Hummer, PA-C  11/03/2023, 8:36 AM  Pager [phone]  Patient seen, examined. Available data reviewed. Agree with findings, assessment, and plan as outlined by Luke Hummer, PA-C.  The patient is independently interviewed and examined.  He sit up in a chair at the bedside.  Elderly male in no distress.  Lungs are clear bilaterally, JVP is normal, heart regular rate and rhythm with 2/6 systolic murmur at the right upper sternal border, abdomen soft nontender, extremities have no edema.  The patient reports that his breathing has improved since undergoing thoracentesis yesterday with removal of 800 cc of fluid.  He has diuresed well and appears euvolemic.  I have reviewed his CTA studies and he has suitable anatomy for transfemoral TAVR with a 23 mm Luke [facility] valve.  Plan to proceed with TAVR as an inpatient tomorrow morning. Following the decision to proceed with transcatheter aortic valve replacement, a discussion has been held regarding what types of management strategies would be attempted intraoperatively in the event of life-threatening complications, including whether or not the patient  would be considered a candidate for the use of cardiopulmonary bypass and/or conversion to open sternotomy for attempted surgical intervention.  The patient has been advised of a variety of complications that might develop including but not limited to risks of death, stroke, paravalvular leak, aortic dissection or other major vascular complications, aortic annulus rupture, device embolization, cardiac rupture or  perforation, mitral regurgitation, acute myocardial infarction, arrhythmia, heart block or bradycardia requiring permanent pacemaker placement, congestive heart failure, respiratory failure, renal failure, pneumonia, infection, other late complications related to structural valve deterioration or migration, or other complications that might ultimately cause a temporary or permanent loss of functional independence or other long term morbidity.  The patient provides full informed consent for the procedure as described and all questions were answered.  Otherwise, as outlined above.  Appreciate advanced heart failure consultation and management.  [name], M.D. 11/03/2023 10:55 AM

## 2023-11-03 NOTE — Evaluation (Signed)
 Occupational Therapy Evaluation Patient Details Name: Luke [facility] MRN: [id number] DOB: [birth year] Today's Date: 11/03/2023   History of Present Illness   Pt is a 78 y.o. male admitted 10/29/23 due to episodic SOB. Pt with severe LFLG AS with plan for TAVR 7/11. Chest x-ray showed R>L pleural effusion and interstitial edema. 7/9 R thoracentesis. Prior admit 6/29 with NSTEMI s/p PCI. PMH: CAD, CABG x2, s/p stent to SVG, severe native vessel disease, ischemic cardiomyopathy, HTN, HLD, history of DVT     Clinical Impressions PTA, pt lives alone, typically Modified Independent with ADLs, household IADLs and mobility using cane though limited by SOB. Pt presents now with deficits in cardiopulmonary tolerance, dynamic standing balance, strength and cognition. Pt able to mobilize to/from bathroom using cane and manage ADLs with no more than CGA. However, pt reports SOB and requires frequent standing rest breaks. Pt will benefit from energy conservation education, pill box test to further assess executive functioning and ADL modification education. Recommend HHOT follow up upon DC.     If plan is discharge home, recommend the following:   A little help with walking and/or transfers;A little help with bathing/dressing/bathroom;Assistance with cooking/housework;Direct supervision/assist for medications management     Functional Status Assessment   Patient has had a recent decline in their functional status and demonstrates the ability to make significant improvements in function in a reasonable and predictable amount of time.     Equipment Recommendations   None recommended by OT     Recommendations for Other Services         Precautions/Restrictions   Precautions Precautions: Fall Recall of Precautions/Restrictions: Intact Restrictions Weight Bearing Restrictions Per Provider Order: No     Mobility Bed Mobility Overal bed mobility: Modified Independent Bed Mobility:  Supine to Sit     Supine to sit: Modified independent (Device/Increase time)          Transfers Overall transfer level: Needs assistance Equipment used: Straight cane Transfers: Sit to/from Stand Sit to Stand: Supervision                  Balance Overall balance assessment: Needs assistance, History of Falls Sitting-balance support: Feet supported, No upper extremity supported Sitting balance-Leahy Scale: Fair     Standing balance support: Single extremity supported, During functional activity, Reliant on assistive device for balance Standing balance-Leahy Scale: Fair                             ADL either performed or assessed with clinical judgement   ADL Overall ADL's : Needs assistance/impaired Eating/Feeding: Set up;Sitting   Grooming: Supervision/safety;Standing;Wash/dry face;Oral care;Wash/dry hands Grooming Details (indicate cue type and reason): frequent standing rest breaks due to reported SOB. declined need for seated rest break Upper Body Bathing: Set up;Sitting   Lower Body Bathing: Contact guard assist;Sit to/from stand   Upper Body Dressing : Set up;Sitting   Lower Body Dressing: Contact guard assist;Sit to/from stand   Toilet Transfer: Contact guard assist;Ambulation Toilet Transfer Details (indicate cue type and reason): with hurrycane. noted to reach out to furniture as well - may benefit from Rollator/RW use Toileting- Clothing Manipulation and Hygiene: Supervision/safety;Sit to/from stand;Sitting/lateral lean       Functional mobility during ADLs: Contact guard assist;Cane       Vision Baseline Vision/History: 1 Wears glasses Ability to See in Adequate Light: 0 Adequate Patient Visual Report: No change from baseline Vision Assessment?: No apparent visual deficits  Perception         Praxis         Pertinent Vitals/Pain Pain Assessment Pain Assessment: Faces Faces Pain Scale: Hurts a little bit Pain Location:  LLE (sciatica issues) Pain Descriptors / Indicators: Discomfort, Guarding Pain Intervention(s): Monitored during session, Limited activity within patient's tolerance     Extremity/Trunk Assessment Upper Extremity Assessment Upper Extremity Assessment: Generalized weakness;Right hand dominant   Lower Extremity Assessment Lower Extremity Assessment: Defer to PT evaluation   Cervical / Trunk Assessment Cervical / Trunk Assessment: Normal   Communication Communication Communication: Impaired Factors Affecting Communication: Hearing impaired   Cognition Arousal: Alert Behavior During Therapy: WFL for tasks assessed/performed Cognition: Cognition impaired     Awareness: Intellectual awareness intact, Online awareness impaired Memory impairment (select all impairments): Short-term memory Attention impairment (select first level of impairment): Sustained attention, Selective attention Executive functioning impairment (select all impairments): Problem solving, Reasoning, Organization OT - Cognition Comments: STM deficits, pleasant. slower processing and problem solving. Accidentally placing 2 hearing aides in same ear. appropriate sequencing of ADL tasks                 Following commands: Intact       Cueing  General Comments   Cueing Techniques: Verbal cues      Exercises     Shoulder Instructions      Home Living Family/patient expects to be discharged to:: Private residence Living Arrangements: Alone Available Help at Discharge: Family;Available PRN/intermittently Type of Home: House Home Access: Stairs to enter [employer] of Steps: 3   Home Layout: One level     Bathroom Shower/Tub: Producer, television/film/video: Standard Bathroom Accessibility: Yes How Accessible: Accessible via walker Home Equipment: Rolling Walker (2 wheels);Other (comment);Grab bars - tub/shower;Shower seat - built Air traffic controller) Adaptive  Equipment: Sock aid;Long-handled Building services engineer        Prior Functioning/Environment Prior Level of Function : Independent/Modified Independent             Mobility Comments: ModI with hurrycane for ~47ft at a time. Has chairs set up for seated rest breaks. Reports 1 fall in past 6 months ADLs Comments: use of AE for LB dressing, does microwaveable meals. reports 1 fall in shower so recently sponge bathing    OT Problem List: Decreased strength;Decreased activity tolerance;Impaired balance (sitting and/or standing);Decreased cognition;Decreased safety awareness;Decreased knowledge of use of DME or AE;Cardiopulmonary status limiting activity   OT Treatment/Interventions: Therapeutic exercise;Self-care/ADL training;Energy conservation;DME and/or AE instruction;Therapeutic activities;Patient/family education;Balance training      OT Goals(Current goals can be found in the care plan section)   Acute Rehab OT Goals Patient Stated Goal: improve breathing OT Goal Formulation: With patient Time For Goal Achievement: 11/17/23 Potential to Achieve Goals: Good   OT Frequency:  Min 2X/week    Co-evaluation              AM-PAC OT 6 Clicks Daily Activity     Outcome Measure Help from another person eating meals?: A Little Help from another person taking care of personal grooming?: A Little Help from another person toileting, which includes using toliet, bedpan, or urinal?: A Little Help from another person bathing (including washing, rinsing, drying)?: A Little Help from another person to put on and taking off regular upper body clothing?: A Little Help from another person to put on and taking off regular lower body clothing?: A Little 6 Click Score: 18   End of Session Nurse Communication: Mobility status  Activity Tolerance: Patient tolerated treatment well Patient left: in chair;with call bell/phone within reach  OT Visit Diagnosis: Unsteadiness on feet (R26.81);Other  abnormalities of gait and mobility (R26.89);Muscle weakness (generalized) (M62.81)                Time: 9268-9195 OT Time Calculation (min): 33 min Charges:  OT General Charges $OT Visit: 1 Visit OT Evaluation $OT Eval Low Complexity: 1 Low OT Treatments $Self Care/Home Management : 8-22 mins  Mliss NOVAK, OTR/L Acute Rehab Services Office: [phone]   Mliss Fish 11/03/2023, 8:09 AM

## 2023-11-03 NOTE — Plan of Care (Signed)
  Problem: Education: Goal: Knowledge of General Education information will improve Description: Including pain rating scale, medication(s)/side effects and non-pharmacologic comfort measures Outcome: Progressing   Problem: Clinical Measurements: Goal: Respiratory complications will improve Outcome: Progressing   Problem: Health Behavior/Discharge Planning: Goal: Ability to manage health-related needs will improve Outcome: Progressing

## 2023-11-03 NOTE — Progress Notes (Addendum)
 HEART AND VASCULAR CENTER   MULTIDISCIPLINARY HEART VALVE TEAM  Patient Name: Luke Harvey Date of Encounter: 11/03/2023  Admit date: 10/29/2023  Primary Care Provider: [facility], [facility] HeartCare Cardiologist: Lonni LITTIE Nanas, MD  [facility] HeartCare Electrophysiologist:  None   [facility] Problem List     Principal Problem:   Heart failure [facility]) Active Problems:   Nonrheumatic aortic (valve) stenosis     Subjective   Up brushing his teeth with OT. Still having SOB but improved from previous.   Inpatient Medications    Scheduled Meds:  aspirin  EC  81 mg Oral Q1200   clopidogrel   75 mg Oral Daily   DULoxetine   30 mg Oral QHS   heparin   5,000 Units Subcutaneous Q8H   insulin  aspart  0-5 Units Subcutaneous QHS   insulin  aspart  0-9 Units Subcutaneous TID WC   levothyroxine   75 mcg Oral Q0600   loratadine   10 mg Oral Q1200   metoprolol  succinate  25 mg Oral QHS   potassium chloride   40 mEq Oral Once   rosuvastatin   40 mg Oral Daily   sodium chloride  flush  3 mL Intravenous Q12H   spironolactone   12.5 mg Oral Daily   Continuous Infusions:  PRN Meds: acetaminophen , artificial tears, fluticasone , ondansetron  (ZOFRAN ) IV, sodium chloride  flush   Vital Signs    Vitals:   11/02/23 1935 11/02/23 2339 11/03/23 0414 11/03/23 0737  BP: 119/61 108/69 99/63 114/66  Pulse: 95 95 91   Resp: 14 20 20 18   Temp: 97.6 F (36.4 C) 97.7 F (36.5 C) 98 F (36.7 C) 98 F (36.7 C)  TempSrc: Oral Oral Oral Oral  SpO2: 100% 96% 98% 98%  Weight:   63 kg   Height:        Intake/Output Summary (Last 24 hours) at 11/03/2023 0836 Last data filed at 11/03/2023 0413 Gross per 24 hour  Intake 237 ml  Output 800 ml  Net -563 ml   Filed Weights   11/01/23 0413 11/02/23 0412 11/03/23 0414  Weight: 63.4 kg 61.7 kg 63 kg    Physical Exam    GEN: Well nourished, well developed, in no acute distress.  HEENT: Grossly normal.  Neck: Supple, no JVD, carotid bruits, or  masses. Cardiac: RRR, 1/6 systolic murmur heard best at LUSB. No rubs, or gallops Respiratory:  Decreased breath sounds at bases GI: Soft, nontender, nondistended, BS + x 4. MS: no deformity or atrophy. Skin: warm and dry, no rash. Neuro:  Strength and sensation are intact. Psych: AAOx3.  Normal affect.  Labs    CBC Recent Labs    11/01/23 0303 11/02/23 0303  WBC 9.2 9.1  HGB 11.8* 11.6*  HCT 35.5* 34.3*  MCV 86.4 84.1  PLT 398 373   Basic Metabolic Panel Recent Labs    92/91/74 1123 11/02/23 0303 11/03/23 0310 11/03/23 0313  NA  --  136  --  136  K  --  3.8  --  3.7  CL  --  97*  --  98  CO2  --  25  --  26  GLUCOSE  --  135*  --  176*  BUN  --  30*  --  28*  CREATININE  --  1.49*  --  1.55*  CALCIUM   --  9.0  --  9.3  MG 2.4  --  2.2  --    Telemetry    Sinus, HRs 90s  - Personally Reviewed  ECG  Sinus, IVCD, HR 86 - Personally Reviewed  Patient Profile     Luke Harvey is a 78 y.o. male with a hx of CAD s/p CABG x2V (2014), multiple PCI's, CKD stage IIIa, ICM w/ EF of 25 to 30%, HLD, T2DM, and LFLG AS who is being seen today for the evaluation of severe AS at the request of Dr. Debera.   Assessment & Plan    Severe LFLG AS: -- Plan TAVR work up.  -- CTs completed and tentative TAVR-TF on Friday morning with Dr. Wonda and Dr. Lightfoot once seen in formal consultation by Dr. Shyrl.   Multivessel CAD: -- Status post CABG in 2014 with LIMA to LAD and SVG to circumflex/OM as well as DES to RCA.  Subsequently underwent PCI to SVG to OM in 2023 and just recently DES x 2 to SVG to OM on June 30 in the setting of NSTEMI.  LIMA to LAD patent with otherwise severe underlying native CAD.   -- High-sensitivity troponin I levels remain elevated in the 200s, but in flat pattern not suggestive of ACS.  -- Continue Aspirin  81mg  daily.  -- Brilinta  discontinued given atypical episodic dyspnea.  -- Loaded with Plavix  300mg  x1 and started on Plavix  75mg   daily.   -- Hold imdur  30mg  daily given symptomatic hypotension.  -- Continue Crestor  40mg  daily.  -- No chest pain.    Acute HFrEF: -- EF 35%. -- CXR with pulm vasc congestion and pleural effusions.  -- Treated with IV Lasix . Net negative 3.9L with improvement in breathing. -- Will resume spironolactone  12.5mg  daily with improvement in BP. -- Holding home Jardiance  with possible inpatient TAVR.    CKD stage IIIa: -- Creat stable at 1.5 -- Continue to monitor.   DMT2: -- Continue SSI   Atrial tachycardia: -- Seem to correlate with SOB attacks. -- Continue Toprol  XL 25mg  at bedtime  Pleural effusions: -- CTA showed moderate to large right and moderate left layering pleural effusions with associated atelectasis. -- S/p IR guided R thoracentesis 11/02/23 with taken off.   [name], PA-C  11/03/2023, 8:36 AM  Pager [phone]  Patient seen, examined. Available data reviewed. Agree with findings, assessment, and plan as outlined by Izetta Hummer, PA-C.  The patient is independently interviewed and examined.  He sit up in a chair at the bedside.  Elderly male in no distress.  Lungs are clear bilaterally, JVP is normal, heart regular rate and rhythm with 2/6 systolic murmur at the right upper sternal border, abdomen soft nontender, extremities have no edema.  The patient reports that his breathing has improved since undergoing thoracentesis yesterday with removal of 800 cc of fluid.  He has diuresed well and appears euvolemic.  I have reviewed his CTA studies and he has suitable anatomy for transfemoral TAVR with a 23 mm Edwards SAPIEN 3 valve.  Plan to proceed with TAVR as an inpatient tomorrow morning. Following the decision to proceed with transcatheter aortic valve replacement, a discussion has been held regarding what types of management strategies would be attempted intraoperatively in the event of life-threatening complications, including whether or not the patient  would be considered a candidate for the use of cardiopulmonary bypass and/or conversion to open sternotomy for attempted surgical intervention.  The patient has been advised of a variety of complications that might develop including but not limited to risks of death, stroke, paravalvular leak, aortic dissection or other major vascular complications, aortic annulus rupture, device embolization, cardiac rupture or  perforation, mitral regurgitation, acute myocardial infarction, arrhythmia, heart block or bradycardia requiring permanent pacemaker placement, congestive heart failure, respiratory failure, renal failure, pneumonia, infection, other late complications related to structural valve deterioration or migration, or other complications that might ultimately cause a temporary or permanent loss of functional independence or other long term morbidity.  The patient provides full informed consent for the procedure as described and all questions were answered.  Otherwise, as outlined above.  Appreciate advanced heart failure consultation and management.  [name], M.D. 11/03/2023 10:55 AM

## 2023-11-04 ENCOUNTER — Inpatient Hospital Stay (HOSPITAL_COMMUNITY): Payer: Self-pay | Admitting: Anesthesiology

## 2023-11-04 ENCOUNTER — Other Ambulatory Visit: Payer: Self-pay

## 2023-11-04 ENCOUNTER — Encounter (HOSPITAL_COMMUNITY)
Admission: EM | Disposition: A | Discharge status: Home Health | Payer: Self-pay | Source: Home / Self Care | Attending: Cardiovascular Disease

## 2023-11-04 ENCOUNTER — Encounter (HOSPITAL_COMMUNITY): Payer: Self-pay | Admitting: Student in an Organized Health Care Education/Training Program

## 2023-11-04 ENCOUNTER — Inpatient Hospital Stay (HOSPITAL_COMMUNITY)

## 2023-11-04 DIAGNOSIS — Z006 Encounter for examination for normal comparison and control in clinical research program: Secondary | ICD-10-CM

## 2023-11-04 DIAGNOSIS — I350 Nonrheumatic aortic (valve) stenosis: Secondary | ICD-10-CM

## 2023-11-04 DIAGNOSIS — Z952 Presence of prosthetic heart valve: Secondary | ICD-10-CM

## 2023-11-04 DIAGNOSIS — I509 Heart failure, unspecified: Secondary | ICD-10-CM

## 2023-11-04 DIAGNOSIS — E119 Type 2 diabetes mellitus without complications: Secondary | ICD-10-CM

## 2023-11-04 DIAGNOSIS — I2510 Atherosclerotic heart disease of native coronary artery without angina pectoris: Secondary | ICD-10-CM

## 2023-11-04 DIAGNOSIS — I5021 Acute systolic (congestive) heart failure: Secondary | ICD-10-CM

## 2023-11-04 LAB — BASIC METABOLIC PANEL WITH GFR
Anion gap: 13 (ref 5–15)
BUN: 29 mg/dL — ABNORMAL HIGH (ref 8–23)
CO2: 23 mmol/L (ref 22–32)
Calcium: 9.6 mg/dL (ref 8.9–10.3)
Chloride: 97 mmol/L — ABNORMAL LOW (ref 98–111)
Creatinine, Ser: 1.35 mg/dL — ABNORMAL HIGH (ref 0.61–1.24)
GFR, Estimated: 53 mL/min — ABNORMAL LOW (ref >60)
Glucose, Bld: 164 mg/dL — ABNORMAL HIGH (ref 70–99)
Potassium: 4.4 mmol/L (ref 3.5–5.1)
Sodium: 133 mmol/L — ABNORMAL LOW (ref 135–145)

## 2023-11-04 LAB — POCT I-STAT, CHEM 8
BUN: 27 mg/dL — ABNORMAL HIGH (ref 8–23)
Calcium, Ion: 1.24 mmol/L (ref 1.15–1.40)
Chloride: 97 mmol/L — ABNORMAL LOW (ref 98–111)
Creatinine, Ser: 1.2 mg/dL (ref 0.61–1.24)
Glucose, Bld: 221 mg/dL — ABNORMAL HIGH (ref 70–99)
HCT: 34 % — ABNORMAL LOW (ref 39.0–52.0)
Hemoglobin: 11.6 g/dL — ABNORMAL LOW (ref 13.0–17.0)
Potassium: 3.8 mmol/L (ref 3.5–5.1)
Sodium: 133 mmol/L — ABNORMAL LOW (ref 135–145)
TCO2: 24 mmol/L (ref 22–32)

## 2023-11-04 LAB — ECHOCARDIOGRAM LIMITED
AR max vel: 0.69 cm2
AV Area VTI: 0.72 cm2
AV Area mean vel: 0.69 cm2
AV Mean grad: 15 mmHg
AV Peak grad: 27 mmHg
Ao pk vel: 2.6 m/s
Height: 66.5 in
P 1/2 time: 215 ms
Weight: 2188.8 [oz_av]

## 2023-11-04 LAB — CBC
HCT: 40.8 % (ref 39.0–52.0)
Hemoglobin: 13.1 g/dL (ref 13.0–17.0)
MCH: 28.4 pg (ref 26.0–34.0)
MCHC: 32.1 g/dL (ref 30.0–36.0)
MCV: 88.5 fL (ref 80.0–100.0)
Platelets: 383 K/uL (ref 150–400)
RBC: 4.61 MIL/uL (ref 4.22–5.81)
RDW: 13.2 % (ref 11.5–15.5)
WBC: 8.7 K/uL (ref 4.0–10.5)
nRBC: 0 % (ref 0.0–0.2)

## 2023-11-04 LAB — SURGICAL PCR SCREEN
MRSA, PCR: POSITIVE — AB
Staphylococcus aureus: POSITIVE — AB

## 2023-11-04 LAB — GLUCOSE, CAPILLARY
Glucose-Capillary: 154 mg/dL — ABNORMAL HIGH (ref 70–99)
Glucose-Capillary: 215 mg/dL — ABNORMAL HIGH (ref 70–99)
Glucose-Capillary: 187 mg/dL — ABNORMAL HIGH (ref 70–99)
Glucose-Capillary: 164 mg/dL — ABNORMAL HIGH (ref 70–99)
Glucose-Capillary: 258 mg/dL — ABNORMAL HIGH (ref 70–99)

## 2023-11-04 MED ORDER — IOHEXOL 350 MG/ML SOLN
INTRAVENOUS | Status: DC | PRN
  Administered 2023-11-04: 35 mL

## 2023-11-04 MED ORDER — DROPERIDOL 2.5 MG/ML IJ SOLN: 0.6250 mg | Freq: Once | INTRAMUSCULAR | Status: DC | PRN

## 2023-11-04 MED ORDER — LACTATED RINGERS IV SOLN: INTRAVENOUS | Status: DC | PRN

## 2023-11-04 MED ORDER — PROTAMINE SULFATE 10 MG/ML IV SOLN
INTRAVENOUS | Status: DC | PRN
  Administered 2023-11-04: 80 mg via INTRAVENOUS

## 2023-11-04 MED ORDER — TRAMADOL HCL 50 MG PO TABS: 50.0000 mg | ORAL_TABLET | ORAL | Status: DC | PRN

## 2023-11-04 MED ORDER — PHENYLEPHRINE HCL-NACL 20-0.9 MG/250ML-% IV SOLN
INTRAVENOUS | Status: AC
  Filled 2023-11-04: qty 250

## 2023-11-04 MED ORDER — OXYCODONE HCL 5 MG PO TABS
5.0000 mg | ORAL_TABLET | ORAL | Status: DC | PRN
  Administered 2023-11-08: 10 mg via ORAL
  Filled 2023-11-04: qty 2

## 2023-11-04 MED ORDER — CEFAZOLIN SODIUM-DEXTROSE 2-4 GM/100ML-% IV SOLN
2.0000 g | Freq: Three times a day (TID) | INTRAVENOUS | Status: AC
  Administered 2023-11-04 (×2): 2 g via INTRAVENOUS
  Filled 2023-11-04 (×2): qty 100

## 2023-11-04 MED ORDER — HEPARIN (PORCINE) IN NACL 1000-0.9 UT/500ML-% IV SOLN
INTRAVENOUS | Status: DC | PRN
  Administered 2023-11-04 (×2): 500 mL

## 2023-11-04 MED ORDER — LIDOCAINE HCL (PF) 1 % IJ SOLN
INTRAMUSCULAR | Status: DC | PRN
  Administered 2023-11-04 (×2): 10 mL

## 2023-11-04 MED ORDER — NOREPINEPHRINE BITARTRATE 1 MG/ML IV SOLN
INTRAVENOUS | Status: DC | PRN
  Administered 2023-11-04: 1 mL via INTRAVENOUS

## 2023-11-04 MED ORDER — FENTANYL CITRATE (PF) 100 MCG/2ML IJ SOLN
INTRAMUSCULAR | Status: DC | PRN
  Administered 2023-11-04: 15 ug via INTRAVENOUS
  Administered 2023-11-04: 10 ug via INTRAVENOUS
  Administered 2023-11-04: 25 ug via INTRAVENOUS

## 2023-11-04 MED ORDER — VANCOMYCIN HCL IN DEXTROSE 1-5 GM/200ML-% IV SOLN
INTRAVENOUS | Status: AC
  Administered 2023-11-04: 1000 mg via INTRAVENOUS
  Filled 2023-11-04: qty 200

## 2023-11-04 MED ORDER — HEPARIN SODIUM (PORCINE) 1000 UNIT/ML IJ SOLN
INTRAMUSCULAR | Status: DC | PRN
  Administered 2023-11-04: 8000 [IU] via INTRAVENOUS

## 2023-11-04 MED ORDER — MUPIROCIN 2 % EX OINT
1.0000 | TOPICAL_OINTMENT | Freq: Two times a day (BID) | CUTANEOUS | Status: DC
  Administered 2023-11-04 – 2023-11-08 (×9): 1 via NASAL
  Filled 2023-11-04 (×2): qty 22

## 2023-11-04 MED ORDER — SODIUM CHLORIDE 0.9% FLUSH: 3.0000 mL | INTRAVENOUS | Status: DC | PRN

## 2023-11-04 MED ORDER — SODIUM CHLORIDE 0.9 % IV SOLN: 250.0000 mL | INTRAVENOUS | Status: DC | PRN

## 2023-11-04 MED ORDER — SODIUM CHLORIDE 0.9 % IV SOLN: INTRAVENOUS | Status: DC

## 2023-11-04 MED ORDER — SODIUM CHLORIDE 0.9% FLUSH
3.0000 mL | Freq: Two times a day (BID) | INTRAVENOUS | Status: DC
  Administered 2023-11-04 – 2023-11-08 (×8): 3 mL via INTRAVENOUS

## 2023-11-04 MED ORDER — CHLORHEXIDINE GLUCONATE CLOTH 2 % EX PADS
6.0000 | MEDICATED_PAD | Freq: Every day | CUTANEOUS | Status: DC
  Administered 2023-11-06 – 2023-11-07 (×3): 6 via TOPICAL

## 2023-11-04 MED ORDER — VANCOMYCIN HCL IN DEXTROSE 1-5 GM/200ML-% IV SOLN: 1000.0000 mg | Freq: Once | INTRAVENOUS | Status: AC

## 2023-11-04 MED ORDER — FENTANYL CITRATE (PF) 100 MCG/2ML IJ SOLN: 25.0000 ug | INTRAMUSCULAR | Status: DC | PRN

## 2023-11-04 MED ORDER — FENTANYL CITRATE (PF) 100 MCG/2ML IJ SOLN
INTRAMUSCULAR | Status: AC
Start: 2023-11-04 — End: 2023-11-04
  Filled 2023-11-04: qty 2

## 2023-11-04 MED ORDER — NITROGLYCERIN IN D5W 200-5 MCG/ML-% IV SOLN: 0.0000 ug/min | INTRAVENOUS | Status: DC

## 2023-11-04 MED ORDER — MORPHINE SULFATE (PF) 2 MG/ML IV SOLN: 1.0000 mg | INTRAVENOUS | Status: DC | PRN

## 2023-11-04 MED ORDER — EPINEPHRINE 1 MG/10ML IJ SOSY
PREFILLED_SYRINGE | INTRAMUSCULAR | Status: AC
  Filled 2023-11-04: qty 10

## 2023-11-04 MED ORDER — LIDOCAINE HCL (PF) 1 % IJ SOLN
INTRAMUSCULAR | Status: AC
  Filled 2023-11-04: qty 30

## 2023-11-04 MED ORDER — EPINEPHRINE HCL 5 MG/250ML IV SOLN IN NS
INTRAVENOUS | Status: AC
  Filled 2023-11-04: qty 250

## 2023-11-04 MED ORDER — NOREPINEPHRINE 4 MG/250ML-% IV SOLN: 0.0000 ug/min | INTRAVENOUS | Status: DC

## 2023-11-04 MED ORDER — CHLORHEXIDINE GLUCONATE 0.12 % MT SOLN
OROMUCOSAL | Status: AC
  Filled 2023-11-04: qty 15

## 2023-11-04 MED ORDER — INSULIN ASPART 100 UNIT/ML IJ SOLN: 0.0000 [IU] | INTRAMUSCULAR | Status: DC | PRN

## 2023-11-04 SURGICAL SUPPLY — 30 items
BAG SNAP BAND KOVER 36X36 (MISCELLANEOUS) ×2 IMPLANT
CABLE ADAPT PACING TEMP 12FT (ADAPTER) IMPLANT
CATH 23 ULTRA DELIVERY (CATHETERS) IMPLANT
CATH DIAG 6FR PIGTAIL ANGLED (CATHETERS) IMPLANT
CATH INFINITI 5FR ANG PIGTAIL (CATHETERS) IMPLANT
CATH INFINITI 6F AL2 (CATHETERS) IMPLANT
CATH S G BIP PACING (CATHETERS) IMPLANT
CLOSURE MYNX CONTROL 6F/7F (Vascular Products) IMPLANT
CLOSURE PERCLOSE PROSTYLE (VASCULAR PRODUCTS) IMPLANT
CRIMPER (MISCELLANEOUS) IMPLANT
DEVICE INFLATION ATRION QL2530 (MISCELLANEOUS) IMPLANT
ELECT DEFIB PAD ADLT CADENCE (PAD) IMPLANT
KIT MICROPUNCTURE NIT STIFF (SHEATH) IMPLANT
KIT SAPIAN 3 ULTRA RESILIA 23 (Valve) IMPLANT
PACK CARDIAC CATHETERIZATION (CUSTOM PROCEDURE TRAY) ×1 IMPLANT
SET ATX-X65L (MISCELLANEOUS) IMPLANT
SHEATH BRITE TIP 7FR 35CM (SHEATH) IMPLANT
SHEATH INTRODUCER SET 20-26 (SHEATH) IMPLANT
SHEATH PINNACLE 6F 10CM (SHEATH) IMPLANT
SHEATH PINNACLE 8F 10CM (SHEATH) IMPLANT
SHIELD CATH-GARD CONTAMINATION (MISCELLANEOUS) IMPLANT
STOPCOCK MORSE 400PSI 3WAY (MISCELLANEOUS) ×2 IMPLANT
TRANSDUCER W/STOPCOCK (MISCELLANEOUS) IMPLANT
TUBING ART PRESS 72 MALE/FEM (TUBING) IMPLANT
WIRE AMPLATZ SS-J .035X180CM (WIRE) IMPLANT
WIRE EMERALD 3MM-J .035X150CM (WIRE) IMPLANT
WIRE EMERALD 3MM-J .035X260CM (WIRE) IMPLANT
WIRE EMERALD ST .035X260CM (WIRE) IMPLANT
WIRE MICROINTRODUCER 60CM (WIRE) IMPLANT
WIRE SAFARI SM CURVE 275 (WIRE) IMPLANT

## 2023-11-04 NOTE — Progress Notes (Signed)
 Pt arrived from cath lab. Bedrest over, CHG bath, VSS, telemetry, CCMD verified. Bilateral groin sites level 0. Ann Nena Hoard, RN

## 2023-11-04 NOTE — Progress Notes (Signed)
 Patient's right groin site oozing, no hematoma. Manual pressure held for 20 minutes. Site redressed, right fem site soft, no hematoma.

## 2023-11-04 NOTE — Op Note (Signed)
 gned       HEART AND VASCULAR CENTER   MULTIDISCIPLINARY HEART VALVE TEAM     TAVR OPERATIVE NOTE     Date of Procedure:                11/04/2023   Preoperative Diagnosis:      Severe Aortic Stenosis    Postoperative Diagnosis:    Same    Procedure:        Transcatheter Aortic Valve Replacement - Percutaneous Transfemoral Approach             Edwards Sapien 3 Ultra Resilia THV (size 23 mm, serial # [id number])              Co-Surgeons:                        Linnie Rayas, MD and [name], MD   Anesthesiologist:                  [name], MD   Echocardiographer:              Maude Emmer, MD   Pre-operative Echo Findings: Severe paradoxical low-flow low gradient aortic stenosis Severe left ventricular systolic dysfunction   Post-operative Echo Findings: No paravalvular leak Unchanged left ventricular systolic function   BRIEF CLINICAL NOTE AND INDICATIONS FOR SURGERY   This is a 78yo male with severe aortic stenosis. He has a history of previous CABG to the LAD and OM. Both grafts remain patent. On review of his imaging, one area of concern is a low left main ostium, but he is protected with his patent bypasses. He has good acces for a right transfemoral TAVR with a 23mm S3UR. Given his previous sternotomy, he would not be a candidate for bailout. The risks and benefits of a transfemoral TAVR were discussed in detail.    DETAILS OF THE OPERATIVE PROCEDURE   PREPARATION:   The patient is brought to the operating room on the above mentioned date and central monitoring was established by the anesthesia team including placement of a radial arterial line. The patient is placed in the supine position on the operating table.  Intravenous antibiotics are administered. The patient is monitored closely throughout the procedure under conscious sedation.   Baseline transthoracic echocardiogram is performed. The patient's chest, abdomen, both groins, and both lower  extremities are prepared and draped in a sterile manner. A time out procedure is performed.     PERIPHERAL ACCESS:   Using ultrasound guidance, femoral arterial and venous access is obtained with placement of 6 Fr sheaths on the left side.  US  images are digitally captured and stored in the patient's chart. A pigtail diagnostic catheter was passed through the femoral arterial sheath under fluoroscopic guidance into the aortic root.  A temporary transvenous pacemaker catheter was passed through the femoral venous sheath under fluoroscopic guidance into the right ventricle.  The pacemaker was tested to ensure stable lead placement and pacemaker capture. Aortic root angiography was performed in order to determine the optimal angiographic angle for valve deployment.   TRANSFEMORAL ACCESS:  A micropuncture technique is used to access the right femoral artery under fluoroscopic and ultrasound guidance.  2 Perclose devices are deployed at 10' and 2' positions to 'PreClose' the femoral artery. An 8 French sheath is placed and then an Amplatz Superstiff wire is advanced through the sheath. This is changed out for a 14 French transfemoral E-Sheath after progressively dilating over  the Superstiff wire.  An AL-2 catheter was used to direct a straight-tip exchange length wire across the native aortic valve into the left ventricle. This was exchanged out for a pigtail catheter and position was confirmed in the LV apex. Simultaneous LV and Ao pressures were recorded.  The pigtail catheter was exchanged for a Safari wire in the LV apex.     BALLOON AORTIC VALVULOPLASTY:  Not performed   TRANSCATHETER HEART VALVE DEPLOYMENT:  An Edwards Sapien 3 Ultra Resilia transcatheter heart valve (size 23 mm) was prepared and crimped per manufacturer's guidelines, and the proper orientation of the valve is confirmed on the [employer] delivery system. The valve was advanced through the introducer sheath using normal technique  until in an appropriate position in the abdominal aorta beyond the sheath tip. The balloon was then retracted and using the fine-tuning wheel was centered on the valve. The valve was then advanced across the aortic arch using appropriate flexion of the catheter. The valve was carefully positioned across the aortic valve annulus. The Commander catheter was retracted using normal technique. Once final position of the valve has been confirmed by angiographic assessment, the valve is deployed while temporarily holding ventilation and during rapid ventricular pacing to maintain systolic blood pressure < 50 mmHg and pulse pressure < 10 mmHg. The balloon inflation is held for >3 seconds after reaching full deployment volume. Once the balloon has fully deflated the balloon is retracted into the ascending aorta and valve function is assessed using echocardiography.  The patient's hemodynamic recovery following valve deployment is slow.  He required a bolus of norepinephrine  and a short run of pacing.  He then came back and reestablished his sinus rhythm and his blood pressure improved fairly quickly.  The deployment balloon and guidewire are both removed. Echo demostrated acceptable post-procedural gradients, stable mitral valve function, and no aortic insufficiency.      PROCEDURE COMPLETION:  The sheath was removed and femoral artery closure is performed using the 2 previously deployed Perclose devices.  Protamine  is administered once femoral arterial repair was complete. The site is clear with no evidence of bleeding or hematoma after the sutures are tightened. The temporary pacemaker and pigtail catheters are removed. Mynx closure is used for contralateral femoral arterial hemostasis for the 6 Fr sheath.   The patient tolerated the procedure well and is transported to the recovery area in stable condition. There were no immediate intraoperative complications. All sponge instrument and needle counts are verified  correct at completion of the operation.    The patient received a total of 35 mL of intravenous contrast during the procedure.   EBL: minimal   LVEDP: 26 mmHg

## 2023-11-04 NOTE — Interval H&P Note (Signed)
 History and Physical Interval Note:  11/04/2023 6:29 AM  Luke Harvey  has presented today for surgery, with the diagnosis of Severe Aortic Stenosis.  The various methods of treatment have been discussed with the patient and family. After consideration of risks, benefits and other options for treatment, the patient has consented to  Procedure(s): Transcatheter Aortic Valve Replacement, Transfemoral (N/A) ECHOCARDIOGRAM, TRANSTHORACIC (N/A) as a surgical intervention.  The patient's history has been reviewed, patient examined, no change in status, stable for surgery.  I have reviewed the patient's chart and labs.  Questions were answered to the patient's satisfaction.     [name]

## 2023-11-04 NOTE — Plan of Care (Signed)
   Problem: Education: Goal: Knowledge of General Education information will improve Description: Including pain rating scale, medication(s)/side effects and non-pharmacologic comfort measures Outcome: Progressing   Problem: Activity: Goal: Risk for activity intolerance will decrease Outcome: Progressing   Problem: Coping: Goal: Level of anxiety will decrease Outcome: Progressing

## 2023-11-04 NOTE — Interval H&P Note (Signed)
 History and Physical Interval Note:  11/04/2023 7:30 AM  Luke Harvey  has presented today for surgery, with the diagnosis of Severe Aortic Stenosis.  The various methods of treatment have been discussed with the patient and family. After consideration of risks, benefits and other options for treatment, the patient has consented to  Procedure(s): Transcatheter Aortic Valve Replacement, Transfemoral (N/A) ECHOCARDIOGRAM, TRANSTHORACIC (N/A) as a surgical intervention.  The patient's history has been reviewed, patient examined, no change in status, stable for surgery.  I have reviewed the patient's chart and labs.  Questions were answered to the patient's satisfaction.     Luke Harvey

## 2023-11-04 NOTE — Plan of Care (Signed)
   Problem: Education: Goal: Knowledge of General Education information will improve Description Including pain rating scale, medication(s)/side effects and non-pharmacologic comfort measures Outcome: Progressing   Problem: Clinical Measurements: Goal: Ability to maintain clinical measurements within normal limits will improve Outcome: Progressing   Problem: Clinical Measurements: Goal: Will remain free from infection Outcome: Progressing

## 2023-11-04 NOTE — Transfer of Care (Signed)
 Immediate Anesthesia Transfer of Care Note  Patient: Luke Harvey [facility]  Procedure(s) Performed: Transcatheter Aortic Valve Replacement, Transfemoral ECHOCARDIOGRAM, TRANSTHORACIC  Patient Location: PACU  Anesthesia Type:MAC  Level of Consciousness: awake and oriented  Airway & Oxygen Therapy: Patient Spontanous Breathing and Patient connected to face mask oxygen  Post-op Assessment: Report given to RN and Post -op Vital signs reviewed and stable  Post vital signs: Reviewed and stable  Last Vitals:  Vitals Value Taken Time  BP    Temp    Pulse 76 11/04/23 09:26  Resp 13 11/04/23 09:26  SpO2 100 % 11/04/23 09:26  Vitals shown include unfiled device data.  Last Pain:  Vitals:   11/04/23 0628  TempSrc:   PainSc: 0-No pain      Patients Stated Pain Goal: 0 (10/29/23 2256)  Complications: There were no known notable events for this encounter.

## 2023-11-04 NOTE — Discharge Instructions (Signed)
 Information on my medicine - ELIQUIS  (apixaban )  This medication education was reviewed with me or my healthcare representative as part of my discharge preparation.   Why was Eliquis  prescribed for you? Eliquis  was prescribed for you to reduce the risk of forming blood clots that can cause a stroke if you have a medical condition called atrial fibrillation (a type of irregular heartbeat) OR to reduce the risk of a blood clots forming after orthopedic surgery.  What do You need to know about Eliquis  ? Take your Eliquis  TWICE DAILY - one tablet in the morning and one tablet in the evening with or without food.  It would be best to take the doses about the same time each day.  If you have difficulty swallowing the tablet whole please discuss with your pharmacist how to take the medication safely.  Take Eliquis  exactly as prescribed by your doctor and DO NOT stop taking Eliquis  without talking to the doctor who prescribed the medication.  Stopping may increase your risk of developing a new clot or stroke.  Refill your prescription before you run out.  After discharge, you should have regular check-up appointments with your healthcare provider that is prescribing your Eliquis .  In the future your dose may need to be changed if your kidney function or weight changes by a significant amount or as you get older.  What do you do if you miss a dose? If you miss a dose, take it as soon as you remember on the same day and resume taking twice daily.  Do not take more than one dose of ELIQUIS  at the same time.  Important Safety Information A possible side effect of Eliquis  is bleeding. You should call your healthcare provider right away if you experience any of the following: Bleeding from an injury or your nose that does not stop. Unusual colored urine (red or dark brown) or unusual colored stools (red or black). Unusual bruising for unknown reasons. A serious fall or if you hit your head (even  if there is no bleeding).  Some medicines may interact with Eliquis  and might increase your risk of bleeding or clotting while on Eliquis . To help avoid this, consult your healthcare provider or pharmacist prior to using any new prescription or non-prescription medications, including herbals, vitamins, non-steroidal anti-inflammatory drugs (NSAIDs) and supplements.  This website has more information on Eliquis  (apixaban ): [email or web address] .com.  ____________________________________________________________________________________  ACTIVITY AND EXERCISE  Daily activity and exercise are an important part of your recovery. People recover at different rates depending on their general health and type of valve procedure.  Most people recovering from TAVR feel better relatively quickly   No lifting, pushing, pulling more than 10 pounds (examples to avoid: groceries, vacuuming, gardening, golfing):             - For one week with a procedure through the groin.             - For six weeks for procedures through the chest wall or neck. NOTE: You will typically see one of our providers 7-14 days after your procedure to discuss WHEN TO RESUME the above activities.      DRIVING  Do not drive until you are seen for follow up and cleared by a provider. Generally, we ask patient to not drive for 1 week after their procedure.  If you have been told by your doctor in the past that you may not drive, you must talk with him/her before you begin driving again.  DRESSING  Groin site: you may leave the clear dressing over the site for up to one week or until it falls off.   HYGIENE  If you had a femoral (leg) procedure, you may take a shower when you return home. After the shower, pat the site dry. Do NOT use powder, oils or lotions in your groin area until the site has completely healed.  If you had a chest procedure, you may shower when you return home unless specifically instructed not to by your discharging  practitioner.             - DO NOT scrub incision; pat dry with a towel.             - DO NOT apply any lotions, oils, powders to the incision.             - No tub baths / swimming for at least 2 weeks.  If you notice any fevers, chills, increased pain, swelling, bleeding or pus, please contact your doctor.   ADDITIONAL INFORMATION  If you are going to have an upcoming dental procedure, please contact our office as you will require antibiotics ahead of time to prevent infection on your heart valve.    If you have any questions or concerns you can call the structural heart phone during normal business hours 8am-4pm. If you have an urgent need after hours or weekends please call [phone] to talk to the on call provider for general cardiology. If you have an emergency that requires immediate attention, please call 911.    After TAVR Checklist  Check  Test Description   Follow up appointment in 1-2 weeks  You will see our structural heart advanced practice provider. Your incision sites will be checked and you will be cleared to drive and resume all normal activities if you are doing well.     1 month echo and follow up  You will have an echo to check on your new heart valve and be seen back in the office by a structural heart advanced practice provider.   Follow up with your primary cardiologist You will need to be seen by your primary cardiologist in the following 3-6 months after your 1 month appointment in the valve clinic.    1 year echo and follow up You will have another echo to check on your heart valve after 1 year and be seen back in the office by a structural heart advanced practice provider. This your last structural heart visit.   Bacterial endocarditis prophylaxis  You will have to take antibiotics for the rest of your life before all dental procedures (even teeth cleanings) to protect your heart valve. Antibiotics are also required before some surgeries. Please check with your  cardiologist before scheduling any surgeries. Also, please make sure to tell us  if you have a penicillin allergy as you will require an alternative antibiotic.

## 2023-11-04 NOTE — Op Note (Signed)
 [facility]   MULTIDISCIPLINARY HEART VALVE TEAM   TAVR OPERATIVE NOTE   Date of Procedure:  11/04/2023  Preoperative Diagnosis: Severe Aortic Stenosis   Postoperative Diagnosis: Same   Procedure:   Transcatheter Aortic Valve Replacement - Percutaneous Transfemoral Approach  Edwards Sapien 3 Ultra Resilia THV (size 23 mm, serial # [id number])   Co-Surgeons:  Linnie Rayas, MD and [name], MD  Anesthesiologist:  [name], MD  Echocardiographer:  Maude Emmer, MD  Pre-operative Echo Findings: Severe paradoxical low-flow low gradient aortic stenosis Severe left ventricular systolic dysfunction  Post-operative Echo Findings: No paravalvular leak Unchanged left ventricular systolic function  BRIEF CLINICAL NOTE AND INDICATIONS FOR SURGERY  The patient hospitalized with acute on chronic systolic heart failure and diagnosed with severe low-flow low gradient aortic stenosis.  He has been optimized medically with help from the advanced heart failure team and presents today for inpatient TAVR after undergoing appropriate preoperative studies and multidisciplinary evaluation with formal cardiac surgical consultation.  During the course of the patient's preoperative work up they have been evaluated comprehensively by a multidisciplinary team of specialists coordinated through the Multidisciplinary Heart Valve Clinic in the [facility].  They have been demonstrated to suffer from symptomatic severe aortic stenosis as noted above. The patient has been counseled extensively as to the relative risks and benefits of all options for the treatment of severe aortic stenosis including long term medical therapy, conventional surgery for aortic valve replacement, and transcatheter aortic valve replacement.  The patient has been independently evaluated in formal cardiac surgical consultation by Dr Rayas, who deemed the patient appropriate for  TAVR. Based upon review of all of the patient's preoperative diagnostic tests they are felt to be candidate for transcatheter aortic valve replacement using the transfemoral approach as an alternative to conventional surgery.    Following the decision to proceed with transcatheter aortic valve replacement, a discussion has been held regarding what types of management strategies would be attempted intraoperatively in the event of life-threatening complications, including whether or not the patient would be considered a candidate for the use of cardiopulmonary bypass and/or conversion to open sternotomy for attempted surgical intervention.  The patient has been advised of a variety of complications that might develop peculiar to this approach including but not limited to risks of death, stroke, paravalvular leak, aortic dissection or other major vascular complications, aortic annulus rupture, device embolization, cardiac rupture or perforation, acute myocardial infarction, arrhythmia, heart block or bradycardia requiring permanent pacemaker placement, congestive heart failure, respiratory failure, renal failure, pneumonia, infection, other late complications related to structural valve deterioration or migration, or other complications that might ultimately cause a temporary or permanent loss of functional independence or other long term morbidity.  The patient provides full informed consent for the procedure as described and all questions were answered preoperatively.  DETAILS OF THE OPERATIVE PROCEDURE  PREPARATION:   The patient is brought to the operating room on the above mentioned date and central monitoring was established by the anesthesia team including placement of a radial arterial line. The patient is placed in the supine position on the operating table.  Intravenous antibiotics are administered. The patient is monitored closely throughout the procedure under conscious sedation.  Baseline  transthoracic echocardiogram is performed. The patient's chest, abdomen, both groins, and both lower extremities are prepared and draped in a sterile manner. A time out procedure is performed.   PERIPHERAL ACCESS:   Using ultrasound guidance, femoral arterial and  venous access is obtained with placement of 6 Fr sheaths on the left side.  US  images are digitally captured and stored in the patient's chart. A pigtail diagnostic catheter was passed through the femoral arterial sheath under fluoroscopic guidance into the aortic root.  A temporary transvenous pacemaker catheter was passed through the femoral venous sheath under fluoroscopic guidance into the right ventricle.  The pacemaker was tested to ensure stable lead placement and pacemaker capture. Aortic root angiography was performed in order to determine the optimal angiographic angle for valve deployment.  TRANSFEMORAL ACCESS:  A micropuncture technique is used to access the right femoral artery under fluoroscopic and ultrasound guidance.  2 Perclose devices are deployed at 10' and 2' positions to 'PreClose' the femoral artery. An 8 French sheath is placed and then an Amplatz Superstiff wire is advanced through the sheath. This is changed out for a 14 French transfemoral E-Sheath after progressively dilating over the Superstiff wire.  An AL-2 catheter was used to direct a straight-tip exchange length wire across the native aortic valve into the left ventricle. This was exchanged out for a pigtail catheter and position was confirmed in the LV apex. Simultaneous LV and Ao pressures were recorded.  The pigtail catheter was exchanged for a Safari wire in the LV apex.    BALLOON AORTIC VALVULOPLASTY:  Not performed  TRANSCATHETER HEART VALVE DEPLOYMENT:  An Edwards Sapien 3 Ultra Resilia transcatheter heart valve (size 23 mm) was prepared and crimped per manufacturer's guidelines, and the proper orientation of the valve is confirmed on the [employer] delivery system. The valve was advanced through the introducer sheath using normal technique until in an appropriate position in the abdominal aorta beyond the sheath tip. The balloon was then retracted and using the fine-tuning wheel was centered on the valve. The valve was then advanced across the aortic arch using appropriate flexion of the catheter. The valve was carefully positioned across the aortic valve annulus. The Commander catheter was retracted using normal technique. Once final position of the valve has been confirmed by angiographic assessment, the valve is deployed while temporarily holding ventilation and during rapid ventricular pacing to maintain systolic blood pressure < 50 mmHg and pulse pressure < 10 mmHg. The balloon inflation is held for >3 seconds after reaching full deployment volume. Once the balloon has fully deflated the balloon is retracted into the ascending aorta and valve function is assessed using echocardiography.  The patient's hemodynamic recovery following valve deployment is slow.  He required a bolus of norepinephrine  and a short run of pacing.  He then came back and reestablished his sinus rhythm and his blood pressure improved fairly quickly.  The deployment balloon and guidewire are both removed. Echo demostrated acceptable post-procedural gradients, stable mitral valve function, and no aortic insufficiency.    PROCEDURE COMPLETION:  The sheath was removed and femoral artery closure is performed using the 2 previously deployed Perclose devices.  Protamine  is administered once femoral arterial repair was complete. The site is clear with no evidence of bleeding or hematoma after the sutures are tightened. The temporary pacemaker and pigtail catheters are removed. Mynx closure is used for contralateral femoral arterial hemostasis for the 6 Fr sheath.  The patient tolerated the procedure well and is transported to the recovery area in stable condition. There were  no immediate intraoperative complications. All sponge instrument and needle counts are verified correct at completion of the operation.   The patient received a total of 35 mL of intravenous  contrast during the procedure.  EBL: minimal  LVEDP: 26 mmHg   [name], MD 11/04/2023 9:33 AM

## 2023-11-05 ENCOUNTER — Inpatient Hospital Stay (HOSPITAL_COMMUNITY)

## 2023-11-05 DIAGNOSIS — Z952 Presence of prosthetic heart valve: Secondary | ICD-10-CM

## 2023-11-05 LAB — ECHOCARDIOGRAM COMPLETE
AR max vel: 1.1 cm2
AV Area VTI: 1.37 cm2
AV Area mean vel: 1.1 cm2
AV Mean grad: 11 mmHg
AV Peak grad: 19 mmHg
Ao pk vel: 2.18 m/s
Area-P 1/2: 5.54 cm2
Calc EF: 19.7 %
Height: 66.5 in
S' Lateral: 5.1 cm
Single Plane A2C EF: 20.3 %
Single Plane A4C EF: 21 %
Weight: 1731.93 [oz_av]

## 2023-11-05 LAB — GLUCOSE, CAPILLARY
Glucose-Capillary: 181 mg/dL — ABNORMAL HIGH (ref 70–99)
Glucose-Capillary: 146 mg/dL — ABNORMAL HIGH (ref 70–99)
Glucose-Capillary: 229 mg/dL — ABNORMAL HIGH (ref 70–99)
Glucose-Capillary: 137 mg/dL — ABNORMAL HIGH (ref 70–99)

## 2023-11-05 LAB — CBC
HCT: 33.6 % — ABNORMAL LOW (ref 39.0–52.0)
Hemoglobin: 11.1 g/dL — ABNORMAL LOW (ref 13.0–17.0)
MCH: 28.4 pg (ref 26.0–34.0)
MCHC: 33 g/dL (ref 30.0–36.0)
MCV: 85.9 fL (ref 80.0–100.0)
Platelets: 281 K/uL (ref 150–400)
RBC: 3.91 MIL/uL — ABNORMAL LOW (ref 4.22–5.81)
RDW: 13.2 % (ref 11.5–15.5)
WBC: 8.3 K/uL (ref 4.0–10.5)
nRBC: 0 % (ref 0.0–0.2)

## 2023-11-05 LAB — BASIC METABOLIC PANEL WITH GFR
Anion gap: 11 (ref 5–15)
BUN: 24 mg/dL — ABNORMAL HIGH (ref 8–23)
CO2: 23 mmol/L (ref 22–32)
Calcium: 8.9 mg/dL (ref 8.9–10.3)
Chloride: 98 mmol/L (ref 98–111)
Creatinine, Ser: 1.36 mg/dL — ABNORMAL HIGH (ref 0.61–1.24)
GFR, Estimated: 53 mL/min — ABNORMAL LOW (ref >60)
Glucose, Bld: 171 mg/dL — ABNORMAL HIGH (ref 70–99)
Potassium: 3.9 mmol/L (ref 3.5–5.1)
Sodium: 132 mmol/L — ABNORMAL LOW (ref 135–145)

## 2023-11-05 LAB — MAGNESIUM: Magnesium: 2.1 mg/dL (ref 1.7–2.4)

## 2023-11-05 MED ORDER — PERFLUTREN LIPID MICROSPHERE
1.0000 mL | INTRAVENOUS | Status: AC | PRN
  Administered 2023-11-05: 2 mL via INTRAVENOUS

## 2023-11-05 NOTE — Progress Notes (Signed)
 OT Cancellation Note  Patient Details Name: Luke Harvey MRN: [id number] DOB: 1945/08/06   Cancelled Treatment:    Reason Eval/Treat Not Completed: Patient at procedure or test/ unavailable. Pt off unit for echo, will return as able to.  Jair Lindblad C, OT  Acute Rehabilitation Services Office [phone] Secure chat preferred   Adrianne GORMAN Savers 11/05/2023, 9:51 AM

## 2023-11-05 NOTE — Progress Notes (Signed)
 Discussed with pt restrictions, walking as tolerated, and CRPII. Pt had stent last week and received education from CR at that time regarding diet, etc. Has referral for CRPII for G'sO. Encouraged pt to prepare for ambulation today (PT to see). He voices he is unmotivated.  [id number] Aliene Aris BS, ACSM-CEP 11/05/2023 12:51 PM

## 2023-11-05 NOTE — Progress Notes (Signed)
 Physical Therapy Treatment Patient Details Name: Luke Harvey MRN: [id number] DOB: 12/18/1945 Today's Date: 11/05/2023   History of Present Illness Pt is a 78 y.o. male admitted 10/29/23 due to episodic SOB. Pt with severe LFLG AS. Chest x-ray showed R>L pleural effusion and interstitial edema. 7/9 R thoracentesis. 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 No overt LOB or buckling. Pt reports feeling weak but notices he can walk much further now compared to abilities PTA. Reviewed LE exercises. Encouraged OOB often with staff. Patient will continue to benefit from skilled physical therapy services to further improve independence with functional mobility. Requesting ensure and water - NT notified who is checking with RN.       If plan is discharge home, recommend the following: Assistance with cooking/housework;Direct supervision/assist for medications management;Assist for transportation;Help with stairs or ramp for entrance;Direct supervision/assist for financial management   Can travel by private vehicle        Equipment Recommendations  None recommended by PT    Recommendations for Other Services       Precautions / Restrictions Precautions Precautions: Fall Recall of Precautions/Restrictions: Intact Precaution/Restrictions Comments: pt with breathing attacks SpO2 100% Restrictions Weight Bearing Restrictions Per Provider Order: No     Mobility  Bed Mobility               General bed mobility comments: In recliner when PT entered room.    Transfers Overall transfer level: Needs assistance Equipment used: Rolling walker (2 wheels) Transfers: Sit to/from Stand Sit to Stand:  Supervision           General transfer comment: Supervision for safety, RW supplied for safety and additional support.No physical assist needed to rise or sit.    Ambulation/Gait Ambulation/Gait assistance: Supervision Gait Distance (Feet): 195 Feet Assistive device: Rolling walker (2 wheels) (hurrycane) Gait Pattern/deviations: Step-through pattern, Decreased stride length, Narrow base of support, Trunk flexed Gait velocity: decreased Gait velocity interpretation: <1.31 ft/sec, indicative of household ambulator   General Gait Details: Grossly stable with RW for support. Cues for safety, symptom awareness, and pacing. Good RW control, slightly flexed at trunk. No overt LOB or buckling. VSS with sats 100% on RA. HR in 90s.   Stairs             Wheelchair Mobility     Tilt Bed    Modified Rankin (Stroke Patients Only)       Balance Overall balance assessment: Needs assistance, History of Falls Sitting-balance support: Feet supported, No upper extremity supported Sitting balance-Leahy Scale: Good     Standing balance support: Single extremity supported, During functional activity, Reliant on assistive device for balance Standing balance-Leahy Scale: Poor Standing balance comment: Stable with UE support.                            Communication Communication Communication: Impaired Factors Affecting Communication: Hearing impaired  Cognition Arousal: Alert Behavior During Therapy: WFL for tasks assessed/performed   PT - Cognitive impairments: No family/caregiver present to determine baseline                       PT - Cognition Comments:  A little delay in processing. Following commands: Intact      Cueing Cueing Techniques: Verbal cues  Exercises General Exercises - Lower Extremity Ankle Circles/Pumps: AROM, Both, 10 reps, Seated Quad Sets: Strengthening, Both, 10 reps, Seated Gluteal Sets: Strengthening, Both, 10 reps, Seated Long  Arc Quad: Strengthening, Both, 10 reps, Seated    General Comments General comments (skin integrity, edema, etc.): Spo2 100% on RA with activity.      Pertinent Vitals/Pain Pain Assessment Pain Assessment: No/denies pain    Home Living                          Prior Function            PT Goals (current goals can now be found in the care plan section) Acute Rehab PT Goals Patient Stated Goal: to go back home PT Goal Formulation: With patient Time For Goal Achievement: 11/16/23 Potential to Achieve Goals: Good Progress towards PT goals: Progressing toward goals    Frequency    Min 2X/week      PT Plan      Co-evaluation              AM-PAC PT 6 Clicks Mobility   Outcome Measure  Help needed turning from your back to your side while in a flat bed without using bedrails?: None Help needed moving from lying on your back to sitting on the side of a flat bed without using bedrails?: A Little Help needed moving to and from a bed to a chair (including a wheelchair)?: A Little Help needed standing up from a chair using your arms (e.g., wheelchair or bedside chair)?: A Little Help needed to walk in hospital room?: A Little Help needed climbing 3-5 steps with a railing? : A Little 6 Click Score: 19    End of Session Equipment Utilized During Treatment: Gait belt Activity Tolerance: Patient tolerated treatment well Patient left: in chair;with call bell/phone within reach Nurse Communication:  (Notified tech pt requests ensure and water) PT Visit Diagnosis: Other abnormalities of gait and mobility (R26.89);Muscle weakness (generalized) (M62.81);Unsteadiness on feet (R26.81)     Time: 8786-8769 PT Time Calculation (min) (ACUTE ONLY): 17 min  Charges:    $Gait Training: 8-22 mins PT General Charges $$ ACUTE PT VISIT: 1 Visit                     Leontine Roads, PT, DPT [facility]  Rehabilitation Services Physical Therapist Office:  [phone] Website: [email or web address]    Leontine GORMAN Roads 11/05/2023, 1:17 PM

## 2023-11-05 NOTE — Progress Notes (Signed)
 Echocardiogram 2D Echocardiogram has been performed.  Hind Chesler N Kiyonna Tortorelli,RDCS 11/05/2023, 10:37 AM

## 2023-11-05 NOTE — Progress Notes (Signed)
 Occupational Therapy Treatment Patient Details Name: Luke Harvey MRN: [id number] DOB: 03/18/1946 Today's Date: 11/05/2023   History of present illness Pt is a 78 y.o. male admitted 10/29/23 due to episodic SOB. Pt with severe LFLG AS. Chest x-ray showed R>L pleural effusion and interstitial edema. 7/9 R thoracentesis. 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 Progressed to complete standing ADLs at supervision level. Provided and reviewed energy conservation and fall prevention handout with pt, would benefit from continued education in future sessions. Continue to recommend HHOT to optimize independence levels. Will continue to follow acutely.      If plan is discharge home, recommend the following:  A little help with walking and/or transfers;A little help with bathing/dressing/bathroom;Assistance with cooking/housework;Direct supervision/assist for medications management   Equipment Recommendations  None recommended by OT    Recommendations for Other Services      Precautions / Restrictions Precautions Precautions: Fall Recall of Precautions/Restrictions: Intact Precaution/Restrictions Comments: pt with breathing attacks SpO2 100% Restrictions Weight Bearing Restrictions Per Provider Order: No       Mobility Bed Mobility Overal bed mobility: Modified Independent    Transfers Overall transfer level: Needs assistance Equipment used: Rolling walker (2 wheels) Transfers: Sit to/from Stand, Bed to chair/wheelchair/BSC Sit to Stand: Supervision     Step pivot transfers: Supervision     General transfer comment: S for safety with RW, multiple rooms laps, noted increased fatigue and decreased balance     Balance Overall balance assessment: Needs assistance, History of Falls Sitting-balance  support: Feet supported, No upper extremity supported Sitting balance-Leahy Scale: Good     Standing balance support: Single extremity supported, During functional activity, Reliant on assistive device for balance Standing balance-Leahy Scale: Poor Standing balance comment: Stable with UE support.       ADL either performed or assessed with clinical judgement   ADL Overall ADL's : Needs assistance/impaired     Grooming: Supervision/safety;Standing;Wash/dry hands       Lower Body Dressing: Supervision/safety;Sit to/from stand   Toilet Transfer: Supervision/safety;Ambulation;Rolling walker (2 wheels);Regular Teacher, adult education Details (indicate cue type and reason): Use of RW Toileting- Clothing Manipulation and Hygiene: Supervision/safety;Sit to/from stand;Sitting/lateral lean       Functional mobility during ADLs: Supervision/safety;Rolling walker (2 wheels) General ADL Comments: Noted decreased activity tolerance    Extremity/Trunk Assessment Upper Extremity Assessment Upper Extremity Assessment: Generalized weakness   Lower Extremity Assessment Lower Extremity Assessment: Defer to PT evaluation        Vision   Vision Assessment?: No apparent visual deficits         Communication Communication Communication: Impaired Factors Affecting Communication: Hearing impaired   Cognition Arousal: Alert Behavior During Therapy: WFL for tasks assessed/performed Cognition: Cognition impaired     Awareness: Intellectual awareness intact, Online awareness impaired Memory impairment (select all impairments): Short-term memory Attention impairment (select first level of impairment): Sustained attention, Selective attention Executive functioning impairment (select all impairments): Problem solving, Reasoning, Organization OT - Cognition Comments: STM deficits, pleasant. slower processing and problem solving. Difficulty understanding verbal education     Following  commands: Intact        Cueing   Cueing Techniques: Verbal cues        General Comments Access Code: [email or web address]  URL: [email or web address]  Date: 11/05/2023  Prepared by: Adrianne Savers  Patient Education  - Printmaker During Daily Tasks  - How to Prevent Falls    Pertinent Vitals/ Pain       Pain Assessment Pain Assessment: No/denies pain   Frequency  Min 2X/week        Progress Toward Goals  OT Goals(current goals can now be found in the care plan section)  Progress towards OT goals: Progressing toward goals  Acute Rehab OT Goals Patient Stated Goal: To rest OT Goal Formulation: With patient Time For Goal Achievement: 11/17/23 Potential to Achieve Goals: Good ADL Goals Pt Will Transfer to Toilet: with modified independence;ambulating Pt Will Perform Toileting - Clothing Manipulation and hygiene: with supervision;sit to/from stand Pt/caregiver will Perform Home Exercise Program: Increased strength;Both right and left upper extremity;With theraband;Independently;With written HEP provided Additional ADL Goal #1: Pt to verbalize at least 3 energy conservation strategies to implement during daily routine Additional ADL Goal #2: Pt to increase standing activity tolerance > 10 min during ADLs/mobility without need for rest break Additional ADL Goal #3: Pt to complete pill box test with 0 errors  Plan         AM-PAC OT 6 Clicks Daily Activity     Outcome Measure   Help from another person eating meals?: None Help from another person taking care of personal grooming?: A Little Help from another person toileting, which includes using toliet, bedpan, or urinal?: A Little Help from another person bathing (including washing, rinsing, drying)?: A Little Help from another person to put on and taking off regular upper body clothing?: A Little Help from another person to put on and taking off regular lower body clothing?: A Little 6 Click Score: 19     End of Session Equipment Utilized During Treatment: Gait belt;Rolling walker (2 wheels)  OT Visit Diagnosis: Unsteadiness on feet (R26.81);Other abnormalities of gait and mobility (R26.89);Muscle weakness (generalized) (M62.81)   Activity Tolerance Patient tolerated treatment well   Patient Left in bed;with call bell/phone within reach;with bed alarm set   Nurse Communication Mobility status        Time: 8568-8547 OT Time Calculation (min): 21 min  Charges: OT General Charges $OT Visit: 1 Visit OT Evaluation $OT Re-eval: 1 Re-eval  Adrianne BROCKS, OT  Acute Rehabilitation Services Office [phone] Secure chat preferred   Adrianne GORMAN Savers 11/05/2023, 3:53 PM

## 2023-11-06 DIAGNOSIS — Z952 Presence of prosthetic heart valve: Secondary | ICD-10-CM

## 2023-11-06 DIAGNOSIS — I5023 Acute on chronic systolic (congestive) heart failure: Secondary | ICD-10-CM

## 2023-11-06 LAB — GLUCOSE, CAPILLARY
Glucose-Capillary: 171 mg/dL — ABNORMAL HIGH (ref 70–99)
Glucose-Capillary: 147 mg/dL — ABNORMAL HIGH (ref 70–99)
Glucose-Capillary: 204 mg/dL — ABNORMAL HIGH (ref 70–99)
Glucose-Capillary: 123 mg/dL — ABNORMAL HIGH (ref 70–99)

## 2023-11-06 LAB — CBC
HCT: 31.3 % — ABNORMAL LOW (ref 39.0–52.0)
Hemoglobin: 10.4 g/dL — ABNORMAL LOW (ref 13.0–17.0)
MCH: 28.3 pg (ref 26.0–34.0)
MCHC: 33.2 g/dL (ref 30.0–36.0)
MCV: 85.3 fL (ref 80.0–100.0)
Platelets: 194 K/uL (ref 150–400)
RBC: 3.67 MIL/uL — ABNORMAL LOW (ref 4.22–5.81)
RDW: 13.1 % (ref 11.5–15.5)
WBC: 7.5 K/uL (ref 4.0–10.5)
nRBC: 0 % (ref 0.0–0.2)

## 2023-11-06 LAB — BASIC METABOLIC PANEL WITH GFR
Anion gap: 12 (ref 5–15)
BUN: 24 mg/dL — ABNORMAL HIGH (ref 8–23)
CO2: 19 mmol/L — ABNORMAL LOW (ref 22–32)
Calcium: 8.8 mg/dL — ABNORMAL LOW (ref 8.9–10.3)
Chloride: 100 mmol/L (ref 98–111)
Creatinine, Ser: 1.37 mg/dL — ABNORMAL HIGH (ref 0.61–1.24)
GFR, Estimated: 52 mL/min — ABNORMAL LOW (ref >60)
Glucose, Bld: 167 mg/dL — ABNORMAL HIGH (ref 70–99)
Potassium: 3.9 mmol/L (ref 3.5–5.1)
Sodium: 131 mmol/L — ABNORMAL LOW (ref 135–145)

## 2023-11-06 LAB — POCT ACTIVATED CLOTTING TIME
Activated Clotting Time: 135 s
Activated Clotting Time: 308 s

## 2023-11-06 MED ORDER — HEPARIN SODIUM (PORCINE) 5000 UNIT/ML IJ SOLN
5000.0000 [IU] | Freq: Three times a day (TID) | INTRAMUSCULAR | Status: DC
  Administered 2023-11-06 – 2023-11-07 (×3): 5000 [IU] via SUBCUTANEOUS
  Filled 2023-11-06 (×4): qty 1

## 2023-11-06 NOTE — Progress Notes (Signed)
 Progress Note  Patient Name: Luke Harvey Date of Encounter: 11/06/2023  Primary Cardiologist: Lonni LITTIE Nanas, MD   Subjective   This is a late entry. The patient was seen and examined on 7/12. This note reflects the findings and plan at that time.  Today, he reports that he is doing well and has no complaints other than that he is tired and has low energy.  Inpatient Medications    Scheduled Meds:  aspirin  EC  81 mg Oral Q1200   Chlorhexidine  Gluconate Cloth  6 each Topical Daily   clopidogrel   75 mg Oral Daily   DULoxetine   30 mg Oral QHS   furosemide   20 mg Oral Daily   heparin   5,000 Units Subcutaneous Q8H   insulin  aspart  0-15 Units Subcutaneous TID WC   insulin  aspart  0-5 Units Subcutaneous QHS   levothyroxine   75 mcg Oral Q0600   loratadine   10 mg Oral Q1200   metoprolol  succinate  25 mg Oral QHS   mupirocin  ointment  1 Application Nasal BID   rosuvastatin   40 mg Oral Daily   sodium chloride  flush  3 mL Intravenous Q12H   sodium chloride  flush  3 mL Intravenous Q12H   spironolactone   12.5 mg Oral Daily   Continuous Infusions:  sodium chloride      nitroGLYCERIN      norepinephrine  (LEVOPHED ) Adult infusion     PRN Meds: sodium chloride , acetaminophen , artificial tears, fluticasone , morphine  injection, ondansetron  (ZOFRAN ) IV, oxyCODONE , sodium chloride  flush, sodium chloride  flush, traMADol    Vital Signs    Vitals:   11/05/23 2303 11/06/23 0611 11/06/23 0830 11/06/23 1151  BP: (!) 127/51  114/73 119/74  Pulse: 99     Resp: 20 18 20  (!) 23  Temp: 98.1 F (36.7 C)  97.6 F (36.4 C) (!) 97.5 F (36.4 C)  TempSrc: Oral  Oral Oral  SpO2: 99%  100%   Weight:  50.1 kg    Height:        Intake/Output Summary (Last 24 hours) at 11/06/2023 1206 Last data filed at 11/05/2023 2322 Gross per 24 hour  Intake --  Output 425 ml  Net -425 ml   Filed Weights   11/04/23 0437 11/05/23 0319 11/06/23 0611  Weight: 62.1 kg 49.1 kg 50.1 kg     Telemetry    Sinus rhythm with frequent PACs and atrial runs - Personally Reviewed  ECG    Sinus rhythm with PACs - Personally Reviewed  Physical Exam   GEN: No acute distress.   Neck: No JVD Cardiac: RRR Respiratory: Breathing easily MS: No edema; No deformity. Neuro:  Nonfocal  Psych: Normal affect   Labs    Chemistry Recent Labs  Lab 11/03/23 1510 11/04/23 0629 11/04/23 0904 11/05/23 0345 11/06/23 0407  NA 134* 133* 133* 132* 131*  K 4.3 4.4 3.8 3.9 3.9  CL 99 97* 97* 98 100  CO2 23 23  --  23 19*  GLUCOSE 123* 164* 221* 171* 167*  BUN 32* 29* 27* 24* 24*  CREATININE 1.48* 1.35* 1.20 1.36* 1.37*  CALCIUM  9.3 9.6  --  8.9 8.8*  PROT 7.0  --   --   --   --   ALBUMIN 3.1*  --   --   --   --   AST 24  --   --   --   --   ALT 19  --   --   --   --   ALKPHOS 82  --   --   --   --  BILITOT 0.6  --   --   --   --   GFRNONAA 48* 53*  --  53* 52*  ANIONGAP 12 13  --  11 12     Hematology Recent Labs  Lab 11/04/23 0629 11/04/23 0904 11/05/23 0345 11/06/23 0407  WBC 8.7  --  8.3 7.5  RBC 4.61  --  3.91* 3.67*  HGB 13.1 11.6* 11.1* 10.4*  HCT 40.8 34.0* 33.6* 31.3*  MCV 88.5  --  85.9 85.3  MCH 28.4  --  28.4 28.3  MCHC 32.1  --  33.0 33.2  RDW 13.2  --  13.2 13.1  PLT 383  --  281 194    Cardiac EnzymesNo results for input(s): TROPONINI in the last 168 hours. No results for input(s): TROPIPOC in the last 168 hours.   BNPNo results for input(s): BNP, PROBNP in the last 168 hours.   DDimer No results for input(s): DDIMER in the last 168 hours.   Summary of Pertinent studies    TTE: Performed today (7/12) official read is pending.  Cardiac cath:   Mid LM to Prox LAD lesion is 95% stenosed with 90% stenosed side branch in Ost Cx to Prox Cx.   Prox LAD to Mid LAD lesion is 100% stenosed with 99% stenosed side branch in 2nd Diag.   Previously placed mid RCA to Dist RCA DES stent is 30% stenosed.  Previously placed Prox RCA DES stent of  is widely patent.   LIMA-LAD and is very large, and existing no disease.  Retrograde flow to the LAD fills a diagonal branch and antegrade flow reaches the apex.  No competitive flow.   2nd Mrg lesion is 100% stenosed.   Prox Graft to Mid Graft lesion is 90% stenosed.  (Lesion #3)   A drug-eluting stent was successfully placed, overlapping with previous distal stent and covering the lesion, using a STENT SYNERGY XD 3.0X32.  Deployed to 3.3 mm with stent balloon.   Post intervention, there is a 0% residual stenosis.   Mid Graft lesion is 80% stenosed (lesion #4.)-Focal in-stent restenosis but otherwise the stent is widely patent.  Scoring balloon angioplasty was performed using a BALLOON SCOREFLEX 3.0X10.  Post intervention, there is a 10% residual stenosis.   Dist Graft lesion is 45% stenosed.   Prox Graft-2 lesion is 60% stenosed.  Prox Graft-1 lesion is 25% stenosed.   A drug-eluting stent was successfully placed overlapping the previous ostial and proximal stent and the newly placed stent, using a STENT SYNERGY XD 3.0X24.  Deployed to 3.3 mm. Post intervention, there is a 0% residual stenosis.   Postintervention, there is TIMI-3 flow maintained throughout the SVG-OM 2 graft   ---------------------------------------   Hemodynamic findings consistent with mild pulmonary hypertension.   There is moderate aortic valve stenosis.  Mean gradient estimated 24.3 mmHg.  Trivial from estimated based on right heart cath numbers  Imaging:   Labs: Cr stable  Patient Profile     78 y.o. 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  Assessment & Plan    Severe aortic stenosis Status post transfemoral TAVR July 11 with a 23 mm Edwards SAPIEN valve  CHF with reduced ejection fraction LVEF 25 to 30% with regional wall motion abnormalities Continue metoprolol  XL 25 mg nightly Bps  relatively low, particularly diastolic BPS, limiting  further GDMT at present  Coronary artery disease Status post CABG in 2014, multiple PCI's Ticagrelor  held, possibly contributing to dyspnea Now on Plavix  75 mg daily, aspirin  81 mg daily  Frequent atrial ectopy He has frequent PACs and atrial runs Unclear to what extent these are causing symptoms; no palpitations He is at increased risk of developing AF  Pleural effusions In the setting of CHF Status post right thoracentesis  Disposition PT/OT to see I have communicated with Dr. Wonda. Patient will remain admitted over the weekend.   For questions or updates, please contact [facility] Please consult [email or web address] for contact info under Cardiology/STEMI.      Signed, Eulas FORBES Furbish, MD 11/06/2023, 12:06 PM

## 2023-11-06 NOTE — Progress Notes (Signed)
 Progress Note  Patient Name: Luke Harvey Date of Encounter: 11/06/2023  Primary Cardiologist: Lonni LITTIE Nanas, MD   Subjective   Note: I saw and examined the patient on 7/12  He was seen by PT/OT yesterday. HHOT, skilled physical therapy services recommended.  Today, he reports that he is doing well and has no complaints other than that he is tired and has low energy.  Inpatient Medications    Scheduled Meds:  aspirin  EC  81 mg Oral Q1200   Chlorhexidine  Gluconate Cloth  6 each Topical Daily   clopidogrel   75 mg Oral Daily   DULoxetine   30 mg Oral QHS   furosemide   20 mg Oral Daily   heparin   5,000 Units Subcutaneous Q8H   insulin  aspart  0-15 Units Subcutaneous TID WC   insulin  aspart  0-5 Units Subcutaneous QHS   levothyroxine   75 mcg Oral Q0600   loratadine   10 mg Oral Q1200   metoprolol  succinate  25 mg Oral QHS   mupirocin  ointment  1 Application Nasal BID   rosuvastatin   40 mg Oral Daily   sodium chloride  flush  3 mL Intravenous Q12H   sodium chloride  flush  3 mL Intravenous Q12H   spironolactone   12.5 mg Oral Daily   Continuous Infusions:  sodium chloride      nitroGLYCERIN      norepinephrine  (LEVOPHED ) Adult infusion     PRN Meds: sodium chloride , acetaminophen , artificial tears, fluticasone , morphine  injection, ondansetron  (ZOFRAN ) IV, oxyCODONE , sodium chloride  flush, sodium chloride  flush, traMADol    Vital Signs    Vitals:   11/05/23 1953 11/05/23 2303 11/06/23 0611 11/06/23 0830  BP: (!) 107/57 (!) 127/51  114/73  Pulse: 100 99    Resp: 20 20 18 20   Temp: 98.3 F (36.8 C) 98.1 F (36.7 C)  97.6 F (36.4 C)  TempSrc: Oral Oral  Oral  SpO2: 99% 99%  100%  Weight:   50.1 kg   Height:        Intake/Output Summary (Last 24 hours) at 11/06/2023 0936 Last data filed at 11/05/2023 2322 Gross per 24 hour  Intake --  Output 425 ml  Net -425 ml   Filed Weights   11/04/23 0437 11/05/23 0319 11/06/23 0611  Weight: 62.1 kg 49.1 kg 50.1 kg     Telemetry    Sinus rhythm with frequent PACs and atrial runs - Personally Reviewed  ECG    Sinus rhythm with PACs - Personally Reviewed  Physical Exam   GEN: No acute distress.   Neck: No JVD Cardiac: RRR, no murmurs, rubs, or gallops.  Respiratory: Clear to auscultation bilaterally. GI: Soft, nontender, non-distended  MS: No edema; No deformity. Neuro:  Nonfocal  Psych: Normal affect   Labs    Chemistry Recent Labs  Lab 11/03/23 1510 11/04/23 0629 11/04/23 0904 11/05/23 0345 11/06/23 0407  NA 134* 133* 133* 132* 131*  K 4.3 4.4 3.8 3.9 3.9  CL 99 97* 97* 98 100  CO2 23 23  --  23 19*  GLUCOSE 123* 164* 221* 171* 167*  BUN 32* 29* 27* 24* 24*  CREATININE 1.48* 1.35* 1.20 1.36* 1.37*  CALCIUM  9.3 9.6  --  8.9 8.8*  PROT 7.0  --   --   --   --   ALBUMIN 3.1*  --   --   --   --   AST 24  --   --   --   --   ALT 19  --   --   --   --  ALKPHOS 82  --   --   --   --   BILITOT 0.6  --   --   --   --   GFRNONAA 48* 53*  --  53* 52*  ANIONGAP 12 13  --  11 12     Hematology Recent Labs  Lab 11/04/23 0629 11/04/23 0904 11/05/23 0345 11/06/23 0407  WBC 8.7  --  8.3 7.5  RBC 4.61  --  3.91* 3.67*  HGB 13.1 11.6* 11.1* 10.4*  HCT 40.8 34.0* 33.6* 31.3*  MCV 88.5  --  85.9 85.3  MCH 28.4  --  28.4 28.3  MCHC 32.1  --  33.0 33.2  RDW 13.2  --  13.2 13.1  PLT 383  --  281 194    Cardiac EnzymesNo results for input(s): TROPONINI in the last 168 hours. No results for input(s): TROPIPOC in the last 168 hours.   BNPNo results for input(s): BNP, PROBNP in the last 168 hours.   DDimer No results for input(s): DDIMER in the last 168 hours.   Summary of Pertinent studies    TTE: EF 20 to 25%.  Grade 2 diastolic dysfunction.  Severe dilation of the left atrium.  Mild mitral regurgitation without stenosis.  Post TAVR  Cardiac cath:   Mid LM to Prox LAD lesion is 95% stenosed with 90% stenosed side branch in Ost Cx to Prox Cx.   Prox LAD to Mid  LAD lesion is 100% stenosed with 99% stenosed side branch in 2nd Diag.   Previously placed mid RCA to Dist RCA DES stent is 30% stenosed.  Previously placed Prox RCA DES stent of is widely patent.   LIMA-LAD and is very large, and existing no disease.  Retrograde flow to the LAD fills a diagonal branch and antegrade flow reaches the apex.  No competitive flow.   2nd Mrg lesion is 100% stenosed.   Prox Graft to Mid Graft lesion is 90% stenosed.  (Lesion #3)   A drug-eluting stent was successfully placed, overlapping with previous distal stent and covering the lesion, using a STENT SYNERGY XD 3.0X32.  Deployed to 3.3 mm with stent balloon.   Post intervention, there is a 0% residual stenosis.   Mid Graft lesion is 80% stenosed (lesion #4.)-Focal in-stent restenosis but otherwise the stent is widely patent.  Scoring balloon angioplasty was performed using a BALLOON SCOREFLEX 3.0X10.  Post intervention, there is a 10% residual stenosis.   Dist Graft lesion is 45% stenosed.   Prox Graft-2 lesion is 60% stenosed.  Prox Graft-1 lesion is 25% stenosed.   A drug-eluting stent was successfully placed overlapping the previous ostial and proximal stent and the newly placed stent, using a STENT SYNERGY XD 3.0X24.  Deployed to 3.3 mm. Post intervention, there is a 0% residual stenosis.   Postintervention, there is TIMI-3 flow maintained throughout the SVG-OM 2 graft   ---------------------------------------   Hemodynamic findings consistent with mild pulmonary hypertension.   There is moderate aortic valve stenosis.  Mean gradient estimated 24.3 mmHg.  Trivial from estimated based on right heart cath numbers  Imaging:   Labs: Cr stable  Patient Profile     78 y.o. 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  Assessment & Plan    Severe aortic stenosis Status post transfemoral TAVR  July 11 with a  23 mm Edwards SAPIEN valve  CHF with reduced ejection fraction LVEF 25 to 30% with regional wall motion abnormalities Continue metoprolol  XL 25 mg nightly Bps relatively low, particularly diastolic BPS, limiting further GDMT at present  Coronary artery disease Status post CABG in 2014, multiple PCI's Ticagrelor  held, possibly contributing to dyspnea Now on Plavix  75 mg daily, aspirin  81 mg daily  Frequent atrial ectopy He has frequent PACs and atrial runs Unclear to what extent these are causing symptoms; no palpitations He is at increased risk of developing AF  Pleural effusions In the setting of CHF Status post right thoracentesis  Disposition Skilled PT recommended   For questions or updates, please contact [facility] Please consult [email or web address] for contact info under Cardiology/STEMI.      Signed, Eulas FORBES Furbish, MD 11/06/2023, 9:36 AM

## 2023-11-06 NOTE — Progress Notes (Addendum)
 Central telemetry called to notify pt just had a burst of atrial tachycardia, rate up to 130s. EP MD rounding and aware. No new orders at this time.

## 2023-11-06 NOTE — Progress Notes (Addendum)
 Physical Therapy Treatment Patient Details Name: Luke Harvey MRN: [id number] DOB: 1945/07/26 Today's Date: 11/06/2023   History of Present Illness Pt is a 78 y.o. male admitted 10/29/23 due to episodic SOB. Pt with severe LFLG AS. Chest x-ray showed R>L pleural effusion and interstitial edema. 7/9 R thoracentesis. 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 PMH: CAD, CABG x2, s/p stent to SVG, severe native vessel disease, ischemic cardiomyopathy, HTN, HLD, history of DVT    PT Comments  Increasing ambulatory tolerance. SpO2 100% on RA with moderate dyspnea. Required 3 standing rest breaks to complete ambulatory bout of approx 110 feet. Practiced navigating stairs similar to home set up, required CGA while using SPC on Rt, and rail on left. Pt states daughter will be able to assist him into and out of his home at d/c. Patient will continue to benefit from skilled physical therapy services to further improve independence with functional mobility.    If plan is discharge home, recommend the following: Assistance with cooking/housework;Direct supervision/assist for medications management;Assist for transportation;Help with stairs or ramp for entrance;Direct supervision/assist for financial management   Can travel by private vehicle        Equipment Recommendations  Rollator (4 wheels)    Recommendations for Other Services       Precautions / Restrictions Precautions Precautions: Fall Recall of Precautions/Restrictions: Intact Precaution/Restrictions Comments: pt with breathing attacks SpO2 100% Restrictions Weight Bearing Restrictions Per Provider Order: No     Mobility  Bed Mobility Overal bed mobility: Modified Independent Bed Mobility: Supine to Sit     Supine to sit: Modified independent (Device/Increase time)     General bed mobility comments: Able to rise without assist.    Transfers Overall transfer level: Needs assistance Equipment used: Rolling walker  (2 wheels) Transfers: Sit to/from Stand, Bed to chair/wheelchair/BSC Sit to Stand: Supervision           General transfer comment: Supervision for safety. RW for support to steady upon rising. Declines trying SPC.    Ambulation/Gait Ambulation/Gait assistance: Supervision Gait Distance (Feet): 110 Feet Assistive device: Rolling walker (2 wheels) Gait Pattern/deviations: Step-through pattern, Decreased stride length, Narrow base of support, Trunk flexed Gait velocity: decreased Gait velocity interpretation: <1.8 ft/sec, indicate of risk for recurrent falls   General Gait Details: Stable with RW for support. Required 3 standing rest breaks to complete distance, moderate dyspnea, recovers ea time he stops to rest. SpO2 100% on RA. HR into 110s. No overt LOB.   Stairs Stairs: Yes Stairs assistance: Contact guard assist Stair Management: One rail Left, Step to pattern, Forwards, With cane Number of Stairs: 3 (x2) General stair comments: Set up similar to home environment. He holds carport rail on Lt, and cane in Rt hand to ascend (opposite on descent.) CGA for safety. Less eccentric control on descent. Practiced twice before he fatigued. States daughter will be able to assist in/out of house at d/c.   Wheelchair Mobility     Tilt Bed    Modified Rankin (Stroke Patients Only)       Balance Overall balance assessment: Needs assistance, History of Falls Sitting-balance support: Feet supported, No upper extremity supported Sitting balance-Leahy Scale: Good     Standing balance support: Single extremity supported, During functional activity, Reliant on assistive device for balance Standing balance-Leahy Scale: Poor Standing balance comment: Stable with UE support.  Communication Communication Communication: Impaired Factors Affecting Communication: Hearing impaired  Cognition Arousal: Alert Behavior During Therapy: WFL for tasks  assessed/performed   PT - Cognitive impairments: No family/caregiver present to determine baseline                         Following commands: Intact      Cueing Cueing Techniques: Verbal cues  Exercises      General Comments General comments (skin integrity, edema, etc.): HR 110s with gait, SpO2 100, dyspneic with exertion.      Pertinent Vitals/Pain Pain Assessment Pain Assessment: No/denies pain Faces Pain Scale: Hurts a little bit    Home Living                          Prior Function            PT Goals (current goals can now be found in the care plan section) Acute Rehab PT Goals Patient Stated Goal: to go back home PT Goal Formulation: With patient Time For Goal Achievement: 11/16/23 Potential to Achieve Goals: Good Progress towards PT goals: Progressing toward goals    Frequency    Min 2X/week      PT Plan      Co-evaluation              AM-PAC PT 6 Clicks Mobility   Outcome Measure  Help needed turning from your back to your side while in a flat bed without using bedrails?: None Help needed moving from lying on your back to sitting on the side of a flat bed without using bedrails?: A Little Help needed moving to and from a bed to a chair (including a wheelchair)?: A Little Help needed standing up from a chair using your arms (e.g., wheelchair or bedside chair)?: A Little Help needed to walk in hospital room?: A Little Help needed climbing 3-5 steps with a railing? : A Little 6 Click Score: 19    End of Session Equipment Utilized During Treatment: Gait belt Activity Tolerance: Patient tolerated treatment well Patient left: in chair;with call bell/phone within reach   PT Visit Diagnosis: Other abnormalities of gait and mobility (R26.89);Muscle weakness (generalized) (M62.81);Unsteadiness on feet (R26.81)     Time: 9195-9174 PT Time Calculation (min) (ACUTE ONLY): 21 min  Charges:    $Gait Training: 8-22  mins PT General Charges $$ ACUTE PT VISIT: 1 Visit                     Leontine Roads, PT, DPT [facility]  Rehabilitation Services Physical Therapist Office: [phone] Website: [email or web address]    Leontine GORMAN Roads 11/06/2023, 10:43 AM

## 2023-11-06 NOTE — Progress Notes (Signed)
 Mobility Specialist Progress Note:    11/06/23 1004  Mobility  Activity Ambulated with assistance in hallway  Level of Assistance Contact guard assist, steadying assist  Assistive Device Front wheel walker  Distance Ambulated (ft) 125 ft  Activity Response Tolerated well  Mobility Referral Yes  Mobility visit 1 Mobility  Mobility Specialist Start Time (ACUTE ONLY) 0919  Mobility Specialist Stop Time (ACUTE ONLY) 0929  Mobility Specialist Time Calculation (min) (ACUTE ONLY) 10 min   Received pt in chair and agreeable to mobility. No physical assistance needed. No c/o throughout. Returned pt to room and left in chair with personal belongings and call light within reach. All needs met.   Lavanda Pollack Mobility Specialist  Please contact via [employer] or  Rehab Office [phone]

## 2023-11-07 ENCOUNTER — Other Ambulatory Visit (HOSPITAL_COMMUNITY): Payer: Self-pay

## 2023-11-07 DIAGNOSIS — N183 Chronic kidney disease, stage 3 unspecified: Secondary | ICD-10-CM | POA: Insufficient documentation

## 2023-11-07 DIAGNOSIS — I480 Paroxysmal atrial fibrillation: Secondary | ICD-10-CM | POA: Insufficient documentation

## 2023-11-07 DIAGNOSIS — J90 Pleural effusion, not elsewhere classified: Secondary | ICD-10-CM | POA: Insufficient documentation

## 2023-11-07 LAB — BASIC METABOLIC PANEL WITH GFR
Anion gap: 10 (ref 5–15)
BUN: 21 mg/dL (ref 8–23)
CO2: 23 mmol/L (ref 22–32)
Calcium: 8.8 mg/dL — ABNORMAL LOW (ref 8.9–10.3)
Chloride: 101 mmol/L (ref 98–111)
Creatinine, Ser: 1.45 mg/dL — ABNORMAL HIGH (ref 0.61–1.24)
GFR, Estimated: 49 mL/min — ABNORMAL LOW (ref >60)
Glucose, Bld: 152 mg/dL — ABNORMAL HIGH (ref 70–99)
Potassium: 4.1 mmol/L (ref 3.5–5.1)
Sodium: 134 mmol/L — ABNORMAL LOW (ref 135–145)

## 2023-11-07 LAB — GLUCOSE, CAPILLARY
Glucose-Capillary: 151 mg/dL — ABNORMAL HIGH (ref 70–99)
Glucose-Capillary: 151 mg/dL — ABNORMAL HIGH (ref 70–99)
Glucose-Capillary: 179 mg/dL — ABNORMAL HIGH (ref 70–99)
Glucose-Capillary: 219 mg/dL — ABNORMAL HIGH (ref 70–99)

## 2023-11-07 LAB — CBC
HCT: 31.8 % — ABNORMAL LOW (ref 39.0–52.0)
Hemoglobin: 10.2 g/dL — ABNORMAL LOW (ref 13.0–17.0)
MCH: 27.6 pg (ref 26.0–34.0)
MCHC: 32.1 g/dL (ref 30.0–36.0)
MCV: 86.2 fL (ref 80.0–100.0)
Platelets: 193 K/uL (ref 150–400)
RBC: 3.69 MIL/uL — ABNORMAL LOW (ref 4.22–5.81)
RDW: 13.3 % (ref 11.5–15.5)
WBC: 6.5 K/uL (ref 4.0–10.5)
nRBC: 0 % (ref 0.0–0.2)

## 2023-11-07 MED ORDER — APIXABAN 2.5 MG PO TABS
2.5000 mg | ORAL_TABLET | Freq: Two times a day (BID) | ORAL | Status: DC
  Administered 2023-11-07 – 2023-11-08 (×3): 2.5 mg via ORAL
  Filled 2023-11-07 (×3): qty 1

## 2023-11-07 MED ORDER — SORBITOL 70 % SOLN
30.0000 mL | Freq: Once | Status: AC
  Administered 2023-11-07: 30 mL via ORAL
  Filled 2023-11-07: qty 30

## 2023-11-07 MED ORDER — EMPAGLIFLOZIN 10 MG PO TABS
10.0000 mg | ORAL_TABLET | Freq: Every day | ORAL | Status: DC
  Administered 2023-11-07 – 2023-11-08 (×2): 10 mg via ORAL
  Filled 2023-11-07 (×2): qty 1

## 2023-11-07 NOTE — Progress Notes (Signed)
 Advanced Heart Failure Rounding Note  Cardiologist: Luke LITTIE Nanas, MD  Chief Complaint:  Subjective:    Denies CP or SOB.   Still a bit weak.   No BM x 8 days  Asymptomatic AF overnight. Now back in NSR   He looks good from cardiac perspective.   Had new AF this am so will need AC with Eliquis  2.5 bid. (D/w PharmD) Can drop ASA in 2 weeks (30 days after PCI)  Stop lasix . Restart Jardiance . Continue toprol  and spiro   We will arrange f/u in HF Clinic. D/w Structural and IC teams.   Will give sorbitol    Luke Fuel, MD  9:47 AM   Objective:   Weight Range: 60.8 kg Body mass index is 21.3 kg/m.   Vital Signs:   Temp:  [97.5 F (36.4 C)-98 F (36.7 C)] 97.9 F (36.6 C) (07/14 0733) Pulse Rate:  [84-99] 97 (07/14 0733) Resp:  [15-23] 19 (07/14 0733) BP: (103-119)/(58-74) 104/59 (07/14 0733) SpO2:  [97 %-99 %] 98 % (07/14 0733) Weight:  [60.8 kg] 60.8 kg (07/14 0554) Last BM Date : 10/26/23  Weight change: Filed Weights   11/05/23 0319 11/06/23 0611 11/07/23 0554  Weight: 49.1 kg 50.1 kg 60.8 kg   Intake/Output:   Intake/Output Summary (Last 24 hours) at 11/07/2023 0948 Last data filed at 11/07/2023 0300 Gross per 24 hour  Intake 277.98 ml  Output 200 ml  Net 77.98 ml    Physical Exam   General:  Elderly. No resp difficulty HEENT: normal Neck: supple. no JVD. Carotids 2+ bilat; no bruits. No lymphadenopathy or thryomegaly appreciated. Cor: PMI nondisplaced. Regular rate & rhythm. No rubs, gallops or murmurs. Lungs: clear Abdomen: soft, nontender, nondistended. No hepatosplenomegaly. No bruits or masses. Good bowel sounds. Extremities: no cyanosis, clubbing, rash, edema Neuro: alert & orientedx3, cranial nerves grossly intact. moves all 4 extremities w/o difficulty. Affect pleasant  Telemetry   NSR 80s several hours of AF this am Personally reviewed  Labs    CBC Recent Labs    11/06/23 0407 11/07/23 0411  WBC 7.5 6.5  HGB  10.4* 10.2*  HCT 31.3* 31.8*  MCV 85.3 86.2  PLT 194 193   Basic Metabolic Panel Recent Labs    92/87/74 0345 11/06/23 0407 11/07/23 0411  NA 132* 131* 134*  K 3.9 3.9 4.1  CL 98 100 101  CO2 23 19* 23  GLUCOSE 171* 167* 152*  BUN 24* 24* 21  CREATININE 1.36* 1.37* 1.45*  CALCIUM  8.9 8.8* 8.8*  MG 2.1  --   --    Liver Function Tests No results for input(s): AST, ALT, ALKPHOS, BILITOT, PROT, ALBUMIN in the last 72 hours. No results for input(s): LIPASE, AMYLASE in the last 72 hours. Cardiac Enzymes No results for input(s): CKTOTAL, CKMB, CKMBINDEX, TROPONINI in the last 72 hours.  BNP: BNP (last 3 results) Recent Labs    10/23/23 1730 10/29/23 1930  BNP 873.7* 1,155.5*    ProBNP (last 3 results) No results for input(s): PROBNP in the last 8760 hours.   D-Dimer No results for input(s): DDIMER in the last 72 hours. Hemoglobin A1C No results for input(s): HGBA1C in the last 72 hours. Fasting Lipid Panel No results for input(s): CHOL, HDL, LDLCALC, TRIG, CHOLHDL, LDLDIRECT in the last 72 hours. Thyroid Function Tests No results for input(s): TSH, T4TOTAL, T3FREE, THYROIDAB in the last 72 hours.  Invalid input(s): FREET3  Other results:  Imaging   No results found.   Medications:  Scheduled Medications:  apixaban   2.5 mg Oral BID   aspirin  EC  81 mg Oral Q1200   Chlorhexidine  Gluconate Cloth  6 each Topical Daily   clopidogrel   75 mg Oral Daily   DULoxetine   30 mg Oral QHS   empagliflozin   10 mg Oral Daily   insulin  aspart  0-15 Units Subcutaneous TID WC   insulin  aspart  0-5 Units Subcutaneous QHS   levothyroxine   75 mcg Oral Q0600   loratadine   10 mg Oral Q1200   metoprolol  succinate  25 mg Oral QHS   mupirocin  ointment  1 Application Nasal BID   rosuvastatin   40 mg Oral Daily   sodium chloride  flush  3 mL Intravenous Q12H   sodium chloride  flush  3 mL Intravenous Q12H   sorbitol   30 mL Oral  Once   spironolactone   12.5 mg Oral Daily    Infusions:  sodium chloride       PRN Medications: sodium chloride , acetaminophen , artificial tears, fluticasone , morphine  injection, ondansetron  (ZOFRAN ) IV, oxyCODONE , sodium chloride  flush, sodium chloride  flush, traMADol  Patient Profile  Luke Harvey is a 78 y.o. 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  HF to see for acute systolic heart failure.   Assessment/Plan   1. Acute on chronic systolic CHF: Echo in 7/25 with EF 25-30% with regional WMAs, normal RV, mild-moderate MR, mild-moderate TR, moderate-severe low flow/low gradient AS with mean gradient 18 mmHg and [name] 0.91 cm^2. With wall motion abnormalities, I suspect this is primarily ischemic cardiomyopathy.  He has moderate-severe low flow/low gradient AS that is not critical but likely contributes to CHF.  On exam, he does not look particularly volume overloaded.  REDS clip is borderline at 35% and CXR showed vascular congestion. He has felt better with IV Lasix . His spells of dyspnea are not totally clear to me, ?reaction to ticagrelor  or possibly triggered by episodes of atrial tachycardia (short runs of tachy noted this admission, ?AT vs sinus tachy). Symptoms do seem out of proportion to CHF. Breathing improved post diuresis and after stopping Brilinta . Appears euvolemic on exam today even a bit dry  - Continue spiro 12.5 - Continue toprol  XL QHS -  Stop lasix . Restart Jardiance  2. Aortic stenosis: Moderate-severe LF/LG AS on 7/25 echo.  Not critical AS but think that it contributes to his dyspnea.   - TAVR CT scans complete - Plan TAVR Friday 3. CKD stage 3: Creatinine stable w/ diuresis, 1.45 today 4. CAD: S/p CABG in 2014 with LIMA-LAD and SVG-OM.  NSTEMI in 6/25 with 80-90% stenoses in SVG-OM, now s/p DES.  No s/s angina - Continue Crestor  - Ticagrelor  discontinued. Felt contributing to dyspnea. Now on Plavix   75 mg daily  5. SVT: Patient has periodic episdoes SVT.  ?Sinus tachycardia versus atrial tachycardia. These episodes may be related to his spells of dyspnea.  - continue Toprol  XL, 25 mg QHS.  6. Pleural effusions: Suspect due to CHF.  S/p right thoracentesis, fluid not sent for analysis.  7. PAF - new diagnosis - start Eliquis  2.5 bid.  - drop ASA in 2 weeks (30 days post-PCI) 8. Constipation - sorbitol   D/w IC & Structural teams.   AHF will sign off. We will arrange outpatient f/u   Length of Stay: 9  Luke Fuel, MD  11/07/2023, 9:48 AM  Advanced Heart Failure Team Pager [phone] (M-F; 7a - 5p)  Please contact [facility] for  night-coverage after hours (5p -7a ) and weekends on [email or web address]

## 2023-11-07 NOTE — TOC Progression Note (Signed)
 Transition of Care [facility]) - Progression Note    Patient Details  Name: Luke Harvey MRN: [id number] Date of Birth: [birth year]  Transition of Care [facility]) CM/SW Contact  Justina Delcia Czar, RN Phone Number: [phone] 11/07/2023, 3:37 PM  Clinical Narrative:      Spoke to pt. Gave permission to speak to dtr, [name]. Spoke to Keyser via phone and pt at bedside. Pt HH arranged with [facility]. Contacted rep, Channing states she will work with pt [facility] CSW to get pt an Engineer, production. She has auth for [facility]. Will need HHRN, PT and OT orders with [facility]. Pt has RW, scale, and cane at home. Educated pt on daily weight and low sodium/heart healthy diet.    Pt wants to go [facility] with [facility]. Dtr states she will provide transportation home.   Daughter wanted to speak to CSW and states wanted pt to go to SNF rehab. Contacted Unit CSW to follow up with daughter.  Will fax referral to [facility] for [employer] for home.     Expected Discharge Plan: Home w Home Health Services Barriers to Discharge: Continued Medical Work up  Expected Discharge Plan and Services In-house Referral: NA Discharge Planning Services: CM Consult Post Acute Care Choice: Home Health Living arrangements for the past 2 months: Single Family Home                   DME Agency: NA       HH Arranged: RN, PT, OT HH Agency: [facility] Date HH Agency Contacted: 11/07/23 Time HH Agency Contacted: 1536 Representative spoke with at [facility] Agency: Channing   Social Determinants of Health (SDOH) Interventions SDOH Screenings   Food Insecurity: No Food Insecurity (10/29/2023)  Housing: Low Risk  (10/29/2023)  Transportation Needs: No Transportation Needs (10/29/2023)  Utilities: Not At Risk (10/29/2023)  Depression (PHQ2-9): Low Risk  (07/16/2020)  Social Connections: Unknown (10/30/2023)  Tobacco Use: Medium Risk (11/04/2023)    Readmission Risk Interventions    11/02/2023   12:38 PM  Readmission Risk Prevention Plan   Transportation Screening Complete  PCP or Specialist Appt within 3-5 Days Complete  HRI or Home Care Consult Complete  Palliative Care Screening Not Applicable  Medication Review (RN Care Manager) Complete

## 2023-11-07 NOTE — Anesthesia Postprocedure Evaluation (Signed)
 Anesthesia Post Note  Patient: Luke Harvey [name]  Procedure(s) Performed: Transcatheter Aortic Valve Replacement, Transfemoral ECHOCARDIOGRAM, TRANSTHORACIC     Patient location during evaluation: Cath Lab Anesthesia Type: MAC Level of consciousness: awake and alert Pain management: pain level controlled Vital Signs Assessment: post-procedure vital signs reviewed and stable Respiratory status: spontaneous breathing Cardiovascular status: stable Anesthetic complications: no   There were no known notable events for this encounter.  Last Vitals:  Vitals:   11/07/23 0554 11/07/23 0733  BP:  (!) 104/59  Pulse:  97  Resp: 19 19  Temp:  36.6 C  SpO2:  98%    Last Pain:  Vitals:   11/07/23 0733  TempSrc: Oral  PainSc:                  [name]

## 2023-11-07 NOTE — Discharge Summary (Signed)
 Discharge Summary   Patient ID: Luke Harvey MRN: [id number]; DOB: [birth year]  Admit date: 10/29/2023 Discharge date: 11/08/2023  PCP:  Cl[facility], [facility] Providers Cardiologist:  Lonni LITTIE Nanas, MD     Discharge Diagnoses  Principal Problem:   Congestive heart failure [facility]) Active Problems:   NSTEMI (non-ST elevated myocardial infarction) (HCC)   Diabetes mellitus (HCC)   HLD (hyperlipidemia)   Nonrheumatic aortic (valve) stenosis   S/P TAVR (transcatheter aortic valve replacement)   Paroxysmal atrial fibrillation (HCC)   CKD (chronic kidney disease), stage III (HCC)   Pleural effusion   SVT (supraventricular tachycardia) (HCC)  Diagnostic Studies/Procedures   Echo: 11/04/2023  IMPRESSIONS     1. Global hypokinesis worse in septum and apex no mural thrombus. Left  ventricular ejection fraction, by estimation, is 20 to 25%. The left  ventricle has severely decreased function. The left ventricle demonstrates  global hypokinesis. The left  ventricular internal cavity size was severely dilated.   2. Right ventricular systolic function is mildly reduced. The right  ventricular size is mildly enlarged.   3. The mitral valve is degenerative. Mild mitral valve regurgitation.  Moderate mitral annular calcification.   4. Pre TAVR: likely tri leaflet with fused and immobile right/left cusps.  Mild AR Severe low flow AS. mean gradient 15 peak 27 mmHg supine in cath  lab. [name] 0.72 cm1      Post TAVR: 23 mm Sapien 3 valve expanded with plus 2 cc. Well  positioned with mean gradient 3 peak 4 mmHg no PVL [name] 2.7 cm2 Note post  TAVR MR appeared mild to moderate . The aortic valve has been  repaired/replaced.   FINDINGS   Left Ventricle: Global hypokinesis worse in septum and apex no mural  thrombus. Left ventricular ejection fraction, by estimation, is 20 to 25%.  The left ventricle has severely decreased function. The left ventricle   demonstrates global hypokinesis. The  left ventricular internal cavity size was severely dilated.   Right Ventricle: The right ventricular size is mildly enlarged. Right  ventricular systolic function is mildly reduced.   Pericardium: There is no evidence of pericardial effusion.   Mitral Valve: The mitral valve is degenerative in appearance. There is  moderate thickening of the mitral valve leaflet(s). There is moderate  calcification of the mitral valve leaflet(s). Moderate mitral annular  calcification. Mild mitral valve  regurgitation.   Tricuspid Valve: The tricuspid valve is normal in structure. Tricuspid  valve regurgitation is mild.   Aortic Valve: Pre TAVR: likely tri leaflet with fused and immobile  right/left cusps. Mild AR Severe low flow AS. mean gradient 15 peak 27  mmHg supine in cath lab. [name] 0.72 cm1  Post TAVR: 23 mm Sapien 3 valve expanded with plus 2 cc. Well positioned  with mean gradient 3 peak 4 mmHg no PVL [name] 2.7 cm2 Note post TAVR MR  appeared mild to moderate. The aortic valve has been repaired/replaced.  Aortic regurgitation PHT measures 215  msec. Aortic valve mean gradient measures 15.0 mmHg. Aortic valve peak  gradient measures 27.0 mmHg. Aortic valve area, by VTI measures 0.72 cm.   Additional Comments: Spectral Doppler performed. Color Doppler performed.     Echo: 11/05/2023  IMPRESSIONS     1. Global hypokinesis worse in septum and apex No mural apical thrombus  with definity . Left ventricular ejection fraction, by estimation, is 20 to  25%. The left ventricle has severely decreased function. The left  ventricle  has no regional wall motion  abnormalities. Left ventricular diastolic parameters are consistent with  Grade II diastolic dysfunction (pseudonormalization). Elevated left  ventricular end-diastolic pressure.   2. Right ventricular systolic function is normal. The right ventricular  size is normal.   3. Left atrial size was  severely dilated.   4. The mitral valve is abnormal. Mild mitral valve regurgitation. No  evidence of mitral stenosis.   5. Post TAVR with 23 mm Sapien 3 valve plus 2 cc inflation. No PVL mean  gradient 11 peak 19 mmHg [name] 1.4 cm2. The aortic valve has been  repaired/replaced. Aortic valve regurgitation is not visualized. No aortic  stenosis is present. There is a 23 mm  Edwards S3U TAVR valve present in the aortic position. Procedure Date:  11/04/2023.   6. The inferior vena cava is normal in size with greater than 50%  respiratory variability, suggesting right atrial pressure of 3 mmHg.   FINDINGS   Left Ventricle: Global hypokinesis worse in septum and apex No mural  apical thrombus with definity . Left ventricular ejection fraction, by  estimation, is 20 to 25%. The left ventricle has severely decreased  function. The left ventricle has no regional  wall motion abnormalities. Definity  contrast agent was given IV to  delineate the left ventricular endocardial borders. Strain was performed  and the global longitudinal strain is indeterminate. The left ventricular  internal cavity size was normal in size.   There is no left ventricular hypertrophy. Left ventricular diastolic  parameters are consistent with Grade II diastolic dysfunction  (pseudonormalization). Elevated left ventricular end-diastolic pressure.   Right Ventricle: The right ventricular size is normal. No increase in  right ventricular wall thickness. Right ventricular systolic function is  normal.   Left Atrium: Left atrial size was severely dilated.   Right Atrium: Right atrial size was normal in size.   Pericardium: There is no evidence of pericardial effusion.   Mitral Valve: The mitral valve is abnormal. There is mild thickening of  the mitral valve leaflet(s). Mild mitral valve regurgitation. No evidence  of mitral valve stenosis.   Tricuspid Valve: The tricuspid valve is normal in structure. Tricuspid   valve regurgitation is mild . No evidence of tricuspid stenosis.   Aortic Valve: Post TAVR with 23 mm Sapien 3 valve plus 2 cc inflation. No  PVL mean gradient 11 peak 19 mmHg [name] 1.4 cm2. The aortic valve has been  repaired/replaced. Aortic valve regurgitation is not visualized. No aortic  stenosis is present. Aortic  valve mean gradient measures 11.0 mmHg. Aortic valve peak gradient  measures 19.0 mmHg. Aortic valve area, by VTI measures 1.37 cm. There is  a 23 mm Edwards S3U TAVR valve present in the aortic position. Procedure  Date: 11/04/2023.   Pulmonic Valve: The pulmonic valve was normal in structure. Pulmonic valve  regurgitation is mild. No evidence of pulmonic stenosis.   Aorta: The aortic root is normal in size and structure.   Venous: The inferior vena cava is normal in size with greater than 50%  respiratory variability, suggesting right atrial pressure of 3 mmHg.   IAS/Shunts: No atrial level shunt detected by color flow Doppler.   Additional Comments: 3D was performed not requiring image post processing  on an independent workstation and was indeterminate.   _____________   History of Present Illness   Luke Harvey is a 78 y.o. male with coronary artery disease status post CABG, multiple PCI's, ischemic cardiomyopathy with a newly reduced EF of  25 to 30% (July 2025), hyperlipidemia, type 2 diabetes, low-flow low gradient aortic stenosis.   Mr. Daily presented to the emergency department for difficulty breathing.  He was recently just admitted with an NSTEMI last week where he had multiple 80 to 90% stenoses in his SVG to his diagonal branch.  He had PCI of his vein graft. The day of admission he described several second episodes where he is unable to breathe.    [name] often has shortness of breath with exertion, however the symptoms that he presented with were not related to his shortness of breath with exertion.  He stated there was something different  going on.  He calls them being attacks when all of a sudden he feels like he cannot take a breath.  These episodes occur at rest and without provocation. They last 5 to 10 seconds in duration and they resolve on their own. These episodes preceded his PCI the week prior and have continued with increasing frequency over the last few days. One of these episodes was very well-described during his last admission on October 25, 2023 by a member of the cardiology care team.  In the past week since his discharge, these episodes of become more frequent   Hospital Course   Consultants: CHF, Structural, TCTS   Acute on Chronic systolic HF -- Echo in 7/25 with EF 25-30% with regional WMAs, normal RV, mild-moderate MR, mild-moderate TR, moderate-severe low flow/low gradient AS with mean gradient 18 mmHg and [name] 0.91 cm^2. with wall motion abnormalities, and it was felt this was primarily ischemic cardiomyopathy.   -- REDS clip was borderline at 35% and CXR showed vascular congestion. Improved with IV lasix  and transitioned from Brilinta  to plavix  with improvement in dyspnea. Appeared euvolemic on exam prior to discharge -- Continue spiro 12.5, Toprol  XL 25mg  at bedtime and jardiance   Aortic Stenosis -- moderate to severe low flow low gradient s/p TAVR with Dr. Wonda on 7/11 -- Follow-up echocardiogram 7/12 showed 23 mm edwards sapien 3 Ultra Resilia THV, no perivalvular leak with mean gradient of 11 mmHg and peak gradient 19 mmHg -- follow up with structural hear clinic has been arranged along with one month echo   CKD III -- Cr stable at 1.49 at discharge  HLD -- continue crestor  40mg  daily -- will need FLP/LFTs in 8 weeks  CAD Hx of CABG (LIMA-LAD, SVG-OM) NSTEMI -- Underwent cardiac catheterization 6/25 with 80 to 90% stenosis in SVG to OM treated with PCI/DES x 1. -- As above on aspirin , Plavix .  With the need for Eliquis  will plan to drop aspirin  81 mg daily in 2 weeks with continuation of  Plavix .  SVT -- Sinus tachycardia versus an atrial tachycardia, felt that these episodes may have been contributing to some of his dyspnea -- Continue Toprol  XL 25 mg daily  DM -- Hgb A1c 7.6 -- resumed on metformin , Jardiance    Pleural effusion -- s/p right thoracentesis  Paroxsymal atrial fibrillation -- episode overnight 7/13 -- new Eliquis  2.5mg  BID  Patient was seen by Dr. [place] and deemed stable for discharge home.  Follow-up arranged in the advanced heart failure clinic as well as structural heart clinic.  Medication sent to [facility] pharmacy.  Educated by [employer].D. prior to discharge.  Orders for home health PT, OT and RN placed at discharge.  Did the patient have an acute coronary syndrome (MI, NSTEMI, STEMI, etc) this admission?:  Yes  AHA/ACC ACS Clinical Performance & Quality Measures: Aspirin  prescribed? - Yes ADP Receptor Inhibitor (Plavix /Clopidogrel , Brilinta /Ticagrelor  or Effient/Prasugrel) prescribed (includes medically managed patients)? - Yes Beta Blocker prescribed? - Yes High Intensity Statin (Lipitor 40-80mg  or Crestor  20-40mg ) prescribed? - Yes EF assessed during THIS hospitalization? - Yes For EF <40%, was ACEI/ARB prescribed? - No - Reason:  cough with ACE/ARB For EF <40%, Aldosterone Antagonist (Spironolactone  or Eplerenone) prescribed? - Yes Cardiac Rehab Phase II ordered (including medically managed patients)? - Yes   _____________  Discharge Vitals Blood pressure 104/63, pulse 86, temperature 97.7 F (36.5 C), temperature source Oral, resp. rate 19, height 5' 6.5 (1.689 m), weight 60.6 kg, SpO2 99%.  Filed Weights   11/06/23 0611 11/07/23 0554 11/08/23 0430  Weight: 50.1 kg 60.8 kg 60.6 kg    Labs & Radiologic Studies  CBC Recent Labs    11/07/23 0411 11/08/23 0407  WBC 6.5 7.4  HGB 10.2* 10.7*  HCT 31.8* 33.2*  MCV 86.2 87.8  PLT 193 221   Basic Metabolic Panel Recent Labs    92/85/74 0411 11/08/23 0407   NA 134* 135  K 4.1 4.8  CL 101 100  CO2 23 24  GLUCOSE 152* 183*  BUN 21 22  CREATININE 1.45* 1.49*  CALCIUM  8.8* 9.2   Liver Function Tests No results for input(s): AST, ALT, ALKPHOS, BILITOT, PROT, ALBUMIN in the last 72 hours. No results for input(s): LIPASE, AMYLASE in the last 72 hours. High Sensitivity Troponin:   Recent Labs  Lab 10/23/23 1620 10/23/23 1753 10/29/23 1545 10/29/23 1823 10/30/23 0041  TROPONINIHS 525* 520* 258* 255* 298*    No results for input(s): TRNPT in the last 720 hours.  BNP Invalid input(s): POCBNP No results for input(s): PROBNP in the last 72 hours.  No results for input(s): BNP in the last 72 hours.  D-Dimer No results for input(s): DDIMER in the last 72 hours. Hemoglobin A1C No results for input(s): HGBA1C in the last 72 hours. Fasting Lipid Panel No results for input(s): CHOL, HDL, LDLCALC, TRIG, CHOLHDL, LDLDIRECT in the last 72 hours. Lipoprotein (a)  Date/Time Value Ref Range Status  10/25/2023 04:44 AM 230.2 (H) <75.0 nmol/L Final    Comment:    (NOTE) This test was developed and its performance characteristics determined by [facility]. It has not been cleared or approved by the Food and Drug Administration. Note:  Values greater than or equal to 75.0 nmol/L may       indicate an independent risk factor for CHD,       but must be evaluated with caution when applied       to non-Caucasian populations due to the       influence of genetic factors on Lp(a) across       ethnicities. Performed At: [facility] [address] [place], [place] [id number] Jennette Shorter MD Ey:[phone]     Thyroid Function Tests No results for input(s): TSH, T4TOTAL, T3FREE, THYROIDAB in the last 72 hours.  Invalid input(s): FREET3 _____________  ECHOCARDIOGRAM COMPLETE Result Date: 11/05/2023    ECHOCARDIOGRAM REPORT   Patient Name:   SHERI PROWS [facility] Date of Exam: 11/05/2023 Medical Rec  #:  [id number]        Height:       66.5 in Accession #:    [id number]       Weight:       108.2 lb Date of Birth:  May 09, 1945        BSA:  1.549 m Patient Age:    33 years         BP:           113/53 mmHg Patient Gender: M                HR:           89 bpm. Exam Location:  Inpatient Procedure: 2D Echo, Color Doppler, Cardiac Doppler and Intracardiac            Opacification Agent (Both Spectral and Color Flow Doppler were            utilized during procedure). Indications:    Post TAVR  History:        Patient has prior history of Echocardiogram examinations, most                 recent 11/04/2023. CHF, CAD, Prior CABG, Aortic Valve Disease;                 Risk Factors:Hypertension, Dyslipidemia and Sleep Apnea.                 Aortic Valve: 23 mm Edwards S3U TAVR valve is present in the                 aortic position. Procedure Date: 11/04/2023.  Sonographer:    Logan Shove RDCS Referring Phys: [id number] KATHRYN R THOMPSON IMPRESSIONS  1. Global hypokinesis worse in septum and apex No mural apical thrombus with definity . Left ventricular ejection fraction, by estimation, is 20 to 25%. The left ventricle has severely decreased function. The left ventricle has no regional wall motion abnormalities. Left ventricular diastolic parameters are consistent with Grade II diastolic dysfunction (pseudonormalization). Elevated left ventricular end-diastolic pressure.  2. Right ventricular systolic function is normal. The right ventricular size is normal.  3. Left atrial size was severely dilated.  4. The mitral valve is abnormal. Mild mitral valve regurgitation. No evidence of mitral stenosis.  5. Post TAVR with 23 mm Sapien 3 valve plus 2 cc inflation. No PVL mean gradient 11 peak 19 mmHg [name] 1.4 cm2. The aortic valve has been repaired/replaced. Aortic valve regurgitation is not visualized. No aortic stenosis is present. There is a 23 mm Edwards S3U TAVR valve present in the aortic position. Procedure Date:  11/04/2023.  6. The inferior vena cava is normal in size with greater than 50% respiratory variability, suggesting right atrial pressure of 3 mmHg. FINDINGS  Left Ventricle: Global hypokinesis worse in septum and apex No mural apical thrombus with definity . Left ventricular ejection fraction, by estimation, is 20 to 25%. The left ventricle has severely decreased function. The left ventricle has no regional wall motion abnormalities. Definity  contrast agent was given IV to delineate the left ventricular endocardial borders. Strain was performed and the global longitudinal strain is indeterminate. The left ventricular internal cavity size was normal in size.  There is no left ventricular hypertrophy. Left ventricular diastolic parameters are consistent with Grade II diastolic dysfunction (pseudonormalization). Elevated left ventricular end-diastolic pressure. Right Ventricle: The right ventricular size is normal. No increase in right ventricular wall thickness. Right ventricular systolic function is normal. Left Atrium: Left atrial size was severely dilated. Right Atrium: Right atrial size was normal in size. Pericardium: There is no evidence of pericardial effusion. Mitral Valve: The mitral valve is abnormal. There is mild thickening of the mitral valve leaflet(s). Mild mitral valve regurgitation. No evidence of mitral valve stenosis. Tricuspid Valve: The tricuspid valve is normal  in structure. Tricuspid valve regurgitation is mild . No evidence of tricuspid stenosis. Aortic Valve: Post TAVR with 23 mm Sapien 3 valve plus 2 cc inflation. No PVL mean gradient 11 peak 19 mmHg [name] 1.4 cm2. The aortic valve has been repaired/replaced. Aortic valve regurgitation is not visualized. No aortic stenosis is present. Aortic valve mean gradient measures 11.0 mmHg. Aortic valve peak gradient measures 19.0 mmHg. Aortic valve area, by VTI measures 1.37 cm. There is a 23 mm Edwards S3U TAVR valve present in the aortic position.  Procedure Date: 11/04/2023. Pulmonic Valve: The pulmonic valve was normal in structure. Pulmonic valve regurgitation is mild. No evidence of pulmonic stenosis. Aorta: The aortic root is normal in size and structure. Venous: The inferior vena cava is normal in size with greater than 50% respiratory variability, suggesting right atrial pressure of 3 mmHg. IAS/Shunts: No atrial level shunt detected by color flow Doppler. Additional Comments: 3D was performed not requiring image post processing on an independent workstation and was indeterminate.  LEFT VENTRICLE PLAX 2D LVIDd:         5.60 cm      Diastology LVIDs:         5.10 cm      LV e' medial:    4.13 cm/s LV PW:         0.70 cm      LV E/e' medial:  31.2 LV IVS:        0.90 cm      LV e' lateral:   5.33 cm/s LVOT diam:     2.10 cm      LV E/e' lateral: 24.2 LV SV:         56 LV SV Index:   36 LVOT Area:     3.46 cm  LV Volumes (MOD) LV vol d, MOD A2C: 192.0 ml LV vol d, MOD A4C: 167.0 ml LV vol s, MOD A2C: 153.0 ml LV vol s, MOD A4C: 132.0 ml LV SV MOD A2C:     39.0 ml LV SV MOD A4C:     167.0 ml LV SV MOD BP:      35.5 ml RIGHT VENTRICLE             IVC RV Basal diam:  3.10 cm     IVC diam: 1.60 cm RV S prime:     11.00 cm/s TAPSE (M-mode): 1.6 cm LEFT ATRIUM             Index        RIGHT ATRIUM           Index LA diam:        4.60 cm 2.97 cm/m   RA Area:     12.80 cm LA Vol (A2C):   95.3 ml 61.52 ml/m  RA Volume:   29.80 ml  19.24 ml/m LA Vol (A4C):   76.9 ml 49.64 ml/m LA Biplane Vol: 87.6 ml 56.55 ml/m  AORTIC VALVE AV Area (Vmax):    1.10 cm AV Area (Vmean):   1.10 cm AV Area (VTI):     1.37 cm AV Vmax:           218.00 cm/s AV Vmean:          152.000 cm/s AV VTI:            0.410 m AV Peak Grad:      19.0 mmHg AV Mean Grad:      11.0 mmHg LVOT Vmax:  69.20 cm/s LVOT Vmean:        48.100 cm/s LVOT VTI:          0.162 m LVOT/AV VTI ratio: 0.40  AORTA Ao Root diam: 2.50 cm Ao Asc diam:  3.50 cm MITRAL VALVE MV Area (PHT): 5.54 cm     SHUNTS  MV Decel Time: 137 msec     Systemic VTI:  0.16 m MV E velocity: 129.00 cm/s  Systemic Diam: 2.10 cm MV A velocity: 80.50 cm/s MV E/A ratio:  1.60 Maude Emmer MD Electronically signed by Maude Emmer MD Signature Date/Time: 11/05/2023/11:31:11 AM    Final    Structural Heart Procedure Result Date: 11/04/2023 See surgical note for result.  ECHOCARDIOGRAM LIMITED Result Date: 11/04/2023    ECHOCARDIOGRAM LIMITED REPORT   Patient Name:   Luke Harvey [facility] Date of Exam: 11/04/2023 Medical Rec #:  [id number]        Height:       66.5 in Accession #:    [id number]       Weight:       136.8 lb Date of Birth:  1945/12/21        BSA:          1.711 m Patient Age:    77 years         BP:           117/67 mmHg Patient Gender: M                HR:           97 bpm. Exam Location:  Inpatient Procedure: Limited Echo, Color Doppler and Cardiac Doppler (Both Spectral and            Color Flow Doppler were utilized during procedure). Indications:    Aortic Stenosis i35.0  History:        Patient has prior history of Echocardiogram examinations, most                 recent 10/25/2023. CHF, CAD, Prior CABG; Risk                 Factors:Dyslipidemia, Hypertension and Sleep Apnea.  Sonographer:    Damien Senior RDCS Referring Phys: [id number] LAMARR SAUNDERS THOMPSON  Sonographer Comments: 23mm Edwards S3U TAVR IMPRESSIONS  1. Global hypokinesis worse in septum and apex no mural thrombus. Left ventricular ejection fraction, by estimation, is 20 to 25%. The left ventricle has severely decreased function. The left ventricle demonstrates global hypokinesis. The left ventricular internal cavity size was severely dilated.  2. Right ventricular systolic function is mildly reduced. The right ventricular size is mildly enlarged.  3. The mitral valve is degenerative. Mild mitral valve regurgitation. Moderate mitral annular calcification.  4. Pre TAVR: likely tri leaflet with fused and immobile right/left cusps. Mild AR Severe low flow AS. mean gradient  15 peak 27 mmHg supine in cath lab. [name] 0.72 cm1     Post TAVR: 23 mm Sapien 3 valve expanded with plus 2 cc. Well positioned with mean gradient 3 peak 4 mmHg no PVL [name] 2.7 cm2 Note post TAVR MR appeared mild to moderate . The aortic valve has been repaired/replaced. FINDINGS  Left Ventricle: Global hypokinesis worse in septum and apex no mural thrombus. Left ventricular ejection fraction, by estimation, is 20 to 25%. The left ventricle has severely decreased function. The left ventricle demonstrates global hypokinesis. The left ventricular internal cavity size was severely dilated. Right Ventricle: The right ventricular size is  mildly enlarged. Right ventricular systolic function is mildly reduced. Pericardium: There is no evidence of pericardial effusion. Mitral Valve: The mitral valve is degenerative in appearance. There is moderate thickening of the mitral valve leaflet(s). There is moderate calcification of the mitral valve leaflet(s). Moderate mitral annular calcification. Mild mitral valve regurgitation. Tricuspid Valve: The tricuspid valve is normal in structure. Tricuspid valve regurgitation is mild. Aortic Valve: Pre TAVR: likely tri leaflet with fused and immobile right/left cusps. Mild AR Severe low flow AS. mean gradient 15 peak 27 mmHg supine in cath lab. [name] 0.72 cm1 Post TAVR: 23 mm Sapien 3 valve expanded with plus 2 cc. Well positioned with mean gradient 3 peak 4 mmHg no PVL [name] 2.7 cm2 Note post TAVR MR appeared mild to moderate. The aortic valve has been repaired/replaced. Aortic regurgitation PHT measures 215 msec. Aortic valve mean gradient measures 15.0 mmHg. Aortic valve peak gradient measures 27.0 mmHg. Aortic valve area, by VTI measures 0.72 cm. Additional Comments: Spectral Doppler performed. Color Doppler performed.  LEFT VENTRICLE PLAX 2D LVOT diam:     2.00 cm LV SV:         35 LV SV Index:   21 LVOT Area:     3.14 cm  AORTIC VALVE AV Area (Vmax):    0.69 cm AV Area (Vmean):   0.69  cm AV Area (VTI):     0.72 cm AV Vmax:           260.00 cm/s AV Vmean:          186.000 cm/s AV VTI:            0.495 m AV Peak Grad:      27.0 mmHg AV Mean Grad:      15.0 mmHg LVOT Vmax:         57.50 cm/s LVOT Vmean:        40.900 cm/s LVOT VTI:          0.113 m LVOT/AV VTI ratio: 0.23 AI PHT:            215 msec  SHUNTS Systemic VTI:  0.11 m Systemic Diam: 2.00 cm Maude Emmer MD Electronically signed by Maude Emmer MD Signature Date/Time: 11/04/2023/9:05:04 AM    Final    DG Chest 1 View Result Date: 11/02/2023 CLINICAL DATA:  Status post thoracentesis. EXAM: CHEST  1 VIEW COMPARISON:  Chest radiograph dated 10/31/2023 FINDINGS: Trace right pleural effusion. No pneumothorax. There is mild cardiomegaly and mild vascular congestion. Median sternotomy wires. No acute osseous pathology. IMPRESSION: Trace right pleural effusion. No pneumothorax. Electronically Signed   By: Vanetta Chou M.D.   On: 11/02/2023 14:22   IR THORACENTESIS ASP PLEURAL SPACE W/IMG GUIDE Result Date: 11/02/2023 INDICATION: 78 year old male with right pleural effusion. Therapeutic thoracentesis requested. EXAM: ULTRASOUND GUIDED RIGHT THORACENTESIS MEDICATIONS: 10 mL 1% lidocaine  COMPLICATIONS: None immediate. PROCEDURE: An ultrasound guided thoracentesis was thoroughly discussed with the patient and questions answered. The benefits, risks, alternatives and complications were also discussed. The patient understands and wishes to proceed with the procedure. Written consent was obtained. Ultrasound was performed to localize and mark an adequate pocket of fluid in the right chest. The area was then prepped and draped in the normal sterile fashion. 1% Lidocaine  was used for local anesthesia. Under ultrasound guidance a 6 Fr Safe-T-Centesis catheter was introduced. Thoracentesis was performed. The catheter was removed and a dressing applied. FINDINGS: A total of approximately 800 mL of dark yellow fluid was removed. Samples were  sent to  the laboratory as requested by the clinical team. IMPRESSION: Successful ultrasound guided right thoracentesis yielding 800 mL of pleural fluid. Performed by: Kacie Matthews PA-C Electronically Signed   By: Ester Sides M.D.   On: 11/02/2023 14:05   CT CORONARY MORPH W/CTA COR W/SCORE W/CA W/CM &/OR WO/CM Addendum Date: 11/02/2023 ADDENDUM REPORT: 11/02/2023 13:13 EXAM: OVER-READ INTERPRETATION CARDIAC CT CHEST The following report is an over-read performed by radiologist Dr. Ryan Salvage of [facility] on 11/02/2023. This over-read does not include interpretation of cardiac or coronary anatomy or pathology. The cardiac interpretation by the cardiologist is attached or will be attached. COMPARISON:  CT chest also from 11/02/2023 FINDINGS: Extracardiac Vascular: Atheromatous vascular calcification of the thoracic aorta and branch vessels. Mediastinum: Numerous scattered upper normal sized mediastinal lymph nodes. Lung: Stable appearance of interstitial accentuation with some faint patchy ground-glass opacities in the lung apices favoring low-level edema or alveolitis. Moderate bilateral pleural effusions with passive atelectasis. Included Upper Abdomen: Unremarkable Musculoskeletal: Mild thoracic spondylosis. Multilevel Schmorl's nodes. IMPRESSION: 1. Moderate bilateral pleural effusions with passive atelectasis. 2. Stable appearance of interstitial accentuation with some faint patchy ground-glass opacities in the lung apices favoring low-level edema or alveolitis. 3. Numerous scattered upper normal sized mediastinal lymph nodes. 4. Mild thoracic spondylosis. 5.  Aortic Atherosclerosis (ICD10-I70.0). Electronically Signed   By: Ryan Salvage M.D.   On: 11/02/2023 13:13   Result Date: 11/02/2023 CLINICAL DATA:  39M with aortic stenosis being evaluated for a TAVR procedure. EXAM: Cardiac TAVR CT TECHNIQUE: A non-contrast, gated CT scan was obtained with axial slices of 2.5 mm through the heart  for aortic valve scoring. A 120 kV retrospective, gated, contrast cardiac scan was obtained. Gantry rotation speed was 230 msec and collimation was 0.63 mm. Nitroglycerin  was not given. The 3D dataset was reconstructed in systole with motion correction. The 3D data set was reconstructed in 5% intervals of the 0-95% of the R-R cycle. Systolic and diastolic phases were analyzed on a dedicated workstation using MPR, MIP, and VRT modes. The patient received 100 cc of contrast. 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 Aortic valve calcium  score: 907 Aortic annulus: Diameter: 27mm x 21mm Perimeter: 75mm Area: 455mm^2 Calcifications: No calcifications Coronary height: Min Left - 9mm ; Min Right - 12mm Sinotubular height: Left cusp - 16mm; Right cusp - 18mm; Noncoronary cusp - 21mm LVOT (as measured 3 mm below the annulus): Diameter: 29mm x 22mm Area: 460mm^2 Calcifications: No calcifications Aortic sinus width: Left cusp - 31mm; Right cusp - 28mm; Noncoronary cusp - 32mm Sinotubular junction width: 32mm x 30mm Optimum Fluoroscopic Angle for Delivery: RAO 9 CAU 15 Cardiac: Right atrium: Normal size Right ventricle: Normal size Pulmonary arteries: Normal size Pulmonary veins: Normal configuration Left atrium: Mild enlargement Left ventricle: Normal size Pericardium: Normal thickness Coronary arteries: Normal origins. S/p CABG with patent LIMA-LAD and SVG-OM with multiple stents within SVG-OM IMPRESSION: 1. 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 Mild to moderate AV calcifications (AV calcium  score 907) 2. Aortic annulus measures 27mm x 21mm in diameter with perimeter 75mm and area 464mm^2. No annular or LVOT calcifications. Annular measurements suitable for delivery of 23mm Edwards Sapien 3 valve 3. Low coronary height to left main, measuring 9mm. Sufficient coronary to  annulus distance for RCA, measures 12mm 4.  Optimum Fluoroscopic Angle for Delivery:  RAO 9 CAU 15 5. S/p  CABG with patent LIMA-LAD and SVG-OM with multiple stents within SVG-OM Electronically Signed: By: Lonni Nanas M.D. On: 11/02/2023 13:06   CT ANGIO CHEST AORTA W/CM & OR WO/CM Result Date: 11/02/2023 CLINICAL DATA:  Aortic valvular disease. Preoperative evaluation prior to aortic valve replacement. EXAM: CT ANGIOGRAPHY CHEST, ABDOMEN AND PELVIS TECHNIQUE: Non-contrast CT of the chest was initially obtained. Multidetector CT imaging through the chest, abdomen and pelvis was performed using the standard protocol during bolus administration of intravenous contrast. Multiplanar reconstructed images and MIPs were obtained and reviewed to evaluate the vascular anatomy. RADIATION DOSE REDUCTION: This exam was performed according to the departmental dose-optimization program which includes automated exposure control, adjustment of the mA and/or kV according to patient size and/or use of iterative reconstruction technique. CONTRAST:  OMNIPAQUE  IOHEXOL  350 MG/ML SOLN COMPARISON:  CT scan of the chest 10/23/2023 FINDINGS: CTA CHEST FINDINGS Cardiovascular: Conventional 3 vessel arch anatomy. Extensive heterogeneous atherosclerotic plaque throughout the aorta. The aortic valve is thickened and calcified. The aortic root is normal in caliber at 3.2 cm in maximal diameter measured at the sinuses of Valsalva. The ascending thoracic aorta is normal in caliber at 3.5 cm. The transverse and descending thoracic aorta are both normal in caliber. Patient is status post median sternotomy with evidence of prior multivessel CABG. Cardiomegaly is present. No pericardial effusion. Mediastinum/Nodes: Unremarkable CT appearance of the thyroid gland. No suspicious mediastinal or hilar adenopathy. No soft tissue mediastinal mass. The thoracic esophagus is unremarkable. Lungs/Pleura: Moderate to large layering right pleural  effusion. A moderate layering left pleural effusion. Mild patchy ground-glass attenuation airspace opacity in the central aspect of the upper lungs. Diffuse lower lobe bronchial wall thickening. Subpleural reticulation with architectural distortion and early honeycombing in the periphery of the lower lungs suggests early pulmonary fibrosis. No pneumothorax. Musculoskeletal: No acute fracture or aggressive appearing lytic or blastic osseous lesion. Review of the MIP images confirms the above findings. CTA ABDOMEN AND PELVIS FINDINGS VASCULAR Aorta: Extensive fibrofatty atherosclerotic plaque throughout the abdominal aorta. No aneurysm or dissection. Celiac: Patent without evidence of aneurysm, dissection, vasculitis or significant stenosis. Lateral segmental branch of the left hepatic artery is replaced to the left gastric artery. SMA: Patent without evidence of aneurysm, dissection, vasculitis or significant stenosis. Renals: Right renal artery is patent. Atherosclerotic plaque results in perhaps mild stenosis at the vessel origin. No aneurysm, dissection or changes of FMD. Critical stenosis at the origin of the left renal artery. IMA: Patent without evidence of aneurysm, dissection, vasculitis or significant stenosis. Inflow: Minimal tortuosity. Extensive fibrofatty atherosclerotic plaque results in significant stenosis in the left common iliac artery. The arterial lumen measures as small as 4 mm. Further, there is a focal non flow limiting dissection flap likely due to a penetrating atherosclerotic ulceration more distally in the left common iliac artery. There is moderate stenosis at this location as well. The external iliac artery is relatively spared from disease. On the right, extensive plaque is present but the vessel remains widely patent. High-grade stenosis of the origin of the left internal iliac artery. The right internal iliac artery is widely patent. Veins: No focal venous abnormality. Review of the  MIP images confirms the above findings. NON-VASCULAR Hepatobiliary: No focal liver abnormality is seen. No gallstones, gallbladder wall thickening, or biliary dilatation. Pancreas: Unremarkable. No pancreatic ductal dilatation or surrounding inflammatory changes. Spleen: Normal in size without focal abnormality. Adrenals/Urinary Tract: Normal adrenal glands. No hydronephrosis, nephrolithiasis or enhancing renal mass. Relatively delayed perfusion of the left  kidney as expected given left renal artery stenosis. Ureters and bladder are unremarkable. Stomach/Bowel: No focal bowel wall thickening or evidence of obstruction. Lymphatic: No suspicious lymphadenopathy. Reproductive: Prostate is unremarkable. Other: Small fat containing umbilical hernia. No evidence of ascites. Musculoskeletal: No acute fracture or malalignment. Healed median sternotomy. Extensive multilevel degenerative disc disease and bilateral facet arthropathy. Left hip arthroplasty prosthesis. Large cystic soft tissue collection in compass is the acetabular component and breaks through the medial wall of the pelvis. There is a narrow zone of transition. Slightly limited evaluation due to streak artifact from the hip arthroplasty prosthesis. Review of the MIP images confirms the above findings. IMPRESSION: VASCULAR 1. Thickened and calcified aortic valve consistent with the clinical history of aortic valvular disease. 2. Extensive mixed calcified and fibrofatty atherosclerotic plaque throughout the thoracoabdominal aorta without evidence of aneurysm or dissection. 3. Moderate stenoses of the left common and proximal external iliac arteries. 4. Right femoral and iliac arteries are widely patent and suitable for access. 5. Minimal tortuosity of the access vessels. 6. 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 NON VASCULAR 1. Abnormal circumscribed low-attenuation/cystic collection in golfing the acetabular component of the left hip  arthroplasty prosthesis with adjacent osteolysis and erosion of the medial wall of the left acetabulum/pelvis. 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 No specific follow-up imaging is recommended unless the patient has clinical symptoms of left hip pain. 2. Moderate to large right and moderate left layering pleural effusions with associated atelectasis. 3. 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 4. 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 5. Small fat containing umbilical hernia. 6. Multilevel degenerative disc disease and lower lumbar facet arthropathy. Electronically Signed   By: Wilkie Lent M.D.   On: 11/02/2023 10:42   CT Angio Abd/Pel w/ and/or w/o Result Date: 11/02/2023 CLINICAL DATA:  Aortic valvular disease. Preoperative evaluation prior to aortic valve replacement. EXAM: CT ANGIOGRAPHY CHEST, ABDOMEN AND PELVIS TECHNIQUE: Non-contrast CT of the chest was initially obtained. Multidetector CT imaging through the chest, abdomen and pelvis was performed using the standard protocol during bolus administration of intravenous contrast. Multiplanar reconstructed images and MIPs were obtained and reviewed to evaluate the vascular anatomy. RADIATION DOSE REDUCTION: This exam was performed according to the departmental dose-optimization program which includes automated exposure control, adjustment of the mA and/or kV according to patient size and/or use of iterative reconstruction technique. CONTRAST:  OMNIPAQUE  IOHEXOL  350 MG/ML SOLN COMPARISON:  CT scan of the chest 10/23/2023 FINDINGS: CTA CHEST FINDINGS Cardiovascular: Conventional 3 vessel arch anatomy. Extensive heterogeneous atherosclerotic plaque throughout the aorta. The aortic valve is thickened and calcified. The aortic root is normal in caliber at 3.2 cm in maximal diameter measured at the sinuses of Valsalva. The ascending thoracic aorta is normal in caliber at 3.5 cm. The transverse and  descending thoracic aorta are both normal in caliber. Patient is status post median sternotomy with evidence of prior multivessel CABG. Cardiomegaly is present. No pericardial effusion. Mediastinum/Nodes: Unremarkable CT appearance of the thyroid gland. No suspicious mediastinal or hilar adenopathy. No soft tissue mediastinal mass. The thoracic esophagus is unremarkable. Lungs/Pleura: Moderate to large layering right pleural effusion. A moderate layering left pleural effusion. Mild patchy ground-glass attenuation airspace opacity in the central aspect of the upper lungs. Diffuse lower lobe bronchial wall thickening. Subpleural reticulation with architectural distortion and early honeycombing in the periphery of the lower lungs suggests early pulmonary fibrosis. No pneumothorax. Musculoskeletal: No acute fracture or aggressive appearing lytic  or blastic osseous lesion. Review of the MIP images confirms the above findings. CTA ABDOMEN AND PELVIS FINDINGS VASCULAR Aorta: Extensive fibrofatty atherosclerotic plaque throughout the abdominal aorta. No aneurysm or dissection. Celiac: Patent without evidence of aneurysm, dissection, vasculitis or significant stenosis. Lateral segmental branch of the left hepatic artery is replaced to the left gastric artery. SMA: Patent without evidence of aneurysm, dissection, vasculitis or significant stenosis. Renals: Right renal artery is patent. Atherosclerotic plaque results in perhaps mild stenosis at the vessel origin. No aneurysm, dissection or changes of FMD. Critical stenosis at the origin of the left renal artery. IMA: Patent without evidence of aneurysm, dissection, vasculitis or significant stenosis. Inflow: Minimal tortuosity. Extensive fibrofatty atherosclerotic plaque results in significant stenosis in the left common iliac artery. The arterial lumen measures as small as 4 mm. Further, there is a focal non flow limiting dissection flap likely due to a penetrating  atherosclerotic ulceration more distally in the left common iliac artery. There is moderate stenosis at this location as well. The external iliac artery is relatively spared from disease. On the right, extensive plaque is present but the vessel remains widely patent. High-grade stenosis of the origin of the left internal iliac artery. The right internal iliac artery is widely patent. Veins: No focal venous abnormality. Review of the MIP images confirms the above findings. NON-VASCULAR Hepatobiliary: No focal liver abnormality is seen. No gallstones, gallbladder wall thickening, or biliary dilatation. Pancreas: Unremarkable. No pancreatic ductal dilatation or surrounding inflammatory changes. Spleen: Normal in size without focal abnormality. Adrenals/Urinary Tract: Normal adrenal glands. No hydronephrosis, nephrolithiasis or enhancing renal mass. Relatively delayed perfusion of the left kidney as expected given left renal artery stenosis. Ureters and bladder are unremarkable. Stomach/Bowel: No focal bowel wall thickening or evidence of obstruction. Lymphatic: No suspicious lymphadenopathy. Reproductive: Prostate is unremarkable. Other: Small fat containing umbilical hernia. No evidence of ascites. Musculoskeletal: No acute fracture or malalignment. Healed median sternotomy. Extensive multilevel degenerative disc disease and bilateral facet arthropathy. Left hip arthroplasty prosthesis. Large cystic soft tissue collection in compass is the acetabular component and breaks through the medial wall of the pelvis. There is a narrow zone of transition. Slightly limited evaluation due to streak artifact from the hip arthroplasty prosthesis. Review of the MIP images confirms the above findings. IMPRESSION: VASCULAR 1. Thickened and calcified aortic valve consistent with the clinical history of aortic valvular disease. 2. Extensive mixed calcified and fibrofatty atherosclerotic plaque throughout the thoracoabdominal aorta  without evidence of aneurysm or dissection. 3. Moderate stenoses of the left common and proximal external iliac arteries. 4. Right femoral and iliac arteries are widely patent and suitable for access. 5. Minimal tortuosity of the access vessels. 6. 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 NON VASCULAR 1. Abnormal circumscribed low-attenuation/cystic collection in golfing the acetabular component of the left hip arthroplasty prosthesis with adjacent osteolysis and erosion of the medial wall of the left acetabulum/pelvis. 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 No specific follow-up imaging is recommended unless the patient has clinical symptoms of left hip pain. 2. Moderate to large right and moderate left layering pleural effusions with associated atelectasis. 3. 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 4. 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 5. Small fat containing umbilical hernia. 6. Multilevel degenerative disc disease and lower lumbar facet arthropathy. Electronically Signed   By: Wilkie Lent M.D.   On: 11/02/2023 10:42   DG CHEST PORT 1 VIEW Result Date: 10/31/2023 CLINICAL DATA:  Breathing issues  for 2 weeks EXAM: PORTABLE CHEST 1 VIEW COMPARISON:  10/29/2023, 03/29/2022 FINDINGS: Sternotomy and coronary stents. Mild cardiomegaly with vascular congestion and diffuse interstitial edema. Small right greater than left pleural effusions. No pneumothorax. IMPRESSION: Cardiomegaly with vascular congestion and diffuse interstitial edema. Small right greater than left pleural effusions. These findings do not appear significantly worsened compared with 10/29/2023 Electronically Signed   By: Luke Bun M.D.   On: 10/31/2023 17:57   DG Chest Portable 1 View Result Date: 10/29/2023 CLINICAL DATA:  Shortness of breath EXAM: PORTABLE CHEST 1 VIEW COMPARISON:  10/23/2023 FINDINGS: Prior CABG. Heart borderline in size. Small to moderate right  pleural effusion. Diffuse interstitial and alveolar opacities have increased concerning for edema. No acute bony abnormality. IMPRESSION: Diffuse interstitial and alveolar opacities, worsening since prior study concerning for edema. Small to moderate right pleural effusion. Electronically Signed   By: Franky Crease M.D.   On: 10/29/2023 17:35   ECHOCARDIOGRAM COMPLETE Result Date: 10/25/2023    ECHOCARDIOGRAM REPORT   Patient Name:   Luke [facility] Date of Exam: 10/25/2023 Medical Rec #:  [id number]        Height:       66.0 in Accession #:    [id number]       Weight:       139.6 lb Date of Birth:  October 27, 1945        BSA:          1.716 m Patient Age:    77 years         BP:           125/90 mmHg Patient Gender: M                HR:           108 bpm. Exam Location:  Inpatient Procedure: 2D Echo, Cardiac Doppler, Color Doppler and Intracardiac            Opacification Agent (Both Spectral and Color Flow Doppler were            utilized during procedure). Indications:    Acute ischemic heart disease, unspecified I24.9                 Aortic stenosis I35.0  History:        Patient has prior history of Echocardiogram examinations, most                 recent 02/16/2022. Previous Myocardial Infarction and Angina,                 Prior CABG, Aortic Valve Disease; Risk Factors:Dyslipidemia.  Sonographer:    Thea Norlander RCS Sonographer#2:  Juliene Rucks Referring Phys: [phone] DAVID W HARDING IMPRESSIONS  1. There is no left ventricular thrombus (Definity  contrast used). Findings suggest infarction/scar versus severe resting ischemia in the mid-distal LAD artery distribution and ischemia/partial scar in the proximal LAD and in the entire right coronary artery distribution. Left ventricular ejection fraction, by estimation, is 25 to 30%. The left ventricle has moderate to severely decreased function. The left ventricle demonstrates regional wall motion abnormalities (see scoring diagram/findings for description). Left  ventricular diastolic parameters are consistent with Grade II diastolic dysfunction (pseudonormalization). Elevated left atrial pressure. There is akinesis of the left ventricular, mid-apical anterolateral wall, anterior wall and anteroseptal wall. There is severe hypokinesis of the left ventricular, basal anteroseptal wall and anterior wall. There is moderate hypokinesis of the left ventricular, basal-mid inferior wall and inferoseptal wall.  2. Right ventricular systolic function is normal. The right ventricular size is normal. There is normal pulmonary artery systolic pressure. The estimated right ventricular systolic pressure is 32.2 mmHg.  3. Left atrial size was moderately dilated.  4. The mitral valve is degenerative. Mild to moderate mitral valve regurgitation. No evidence of mitral stenosis. Moderate to severe mitral annular calcification.  5. Tricuspid valve regurgitation is mild to moderate.  6. The right and left aortic cusps are virtually immobile (fused?), while the posterior noncoronary cusp has almost normal mobility. Hemodynamics are consistet with moderate to severe low flow-low gradient aortic stenosis. The aortic valve has an indeterminant number of cusps. There is moderate calcification of the aortic valve. There is moderate thickening of the aortic valve. Aortic valve regurgitation is moderate. Moderate to severe aortic valve stenosis. Aortic valve mean gradient measures 17.7 mmHg. Aortic valve Vmax measures 2.81 m/s. Aortic valve acceleration time measures 95 msec.  7. The inferior vena cava is normal in size with greater than 50% respiratory variability, suggesting right atrial pressure of 3 mmHg. Comparison(s): A prior study was performed on 2023 [facility]. Prior images unable to be directly viewed, comparison made by report only. The left ventricular function is significantly worse. The left ventricular wall motion abnormalities are significantly worse. Aortic stenosis is worse and was  underestimated on previous studies due to low stroke volume index. 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 FINDINGS  Left Ventricle: There is no left ventricular thrombus (Definity  contrast used). Findings suggest infarction/scar versus severe resting ischemia in the mid-distal LAD artery distribution and ischemia/partial scar in the proximal LAD and in the entire right coronary artery distribution. Left ventricular ejection fraction, by estimation, is 25 to 30%. The left ventricle has moderate to severely decreased function. The left ventricle demonstrates regional wall motion abnormalities. Severe hypokinesis of  the left ventricular, basal anteroseptal wall and anterior wall. Moderate hypokinesis of the left ventricular, basal-mid inferior wall and inferoseptal wall. Definity  contrast agent was given IV to delineate the left ventricular endocardial borders. The  left ventricular internal cavity size was normal in size. There is no left ventricular hypertrophy. Left ventricular diastolic parameters are consistent with Grade II diastolic dysfunction (pseudonormalization). Elevated left atrial pressure.  LV Wall Scoring: The mid anteroseptal segment, apical lateral segment, mid anterolateral segment, apical anterior segment, apical inferior segment, and apex are akinetic. The anterior wall, inferior septum, inferior wall, and basal anteroseptal segment are hypokinetic. The posterior wall and basal anterolateral segment are normal. Right Ventricle: The right ventricular size is normal. No increase in right ventricular wall thickness. Right ventricular systolic function is normal. There is normal pulmonary artery systolic pressure. The tricuspid regurgitant velocity is 2.70 m/s, and  with an assumed right  atrial pressure of 3 mmHg, the estimated right ventricular systolic pressure is 32.2 mmHg. Left Atrium: Left atrial size was moderately dilated. Right Atrium: Right atrial size was normal in size. Pericardium: There is no evidence of pericardial effusion. Mitral Valve: The mitral valve is degenerative in appearance. Moderate to severe mitral annular calcification. Mild to moderate mitral valve regurgitation, with centrally-directed jet. No evidence of mitral valve stenosis. Tricuspid Valve: The tricuspid valve is normal in structure. Tricuspid valve regurgitation is mild to moderate. Aortic Valve: The right and left aortic cusps are virtually immobile (fused?), while the posterior noncoronary cusp has almost normal mobility.  Hemodynamics are consistet with moderate to severe low flow-low gradient aortic stenosis. The aortic valve has  an indeterminant number of cusps. There is moderate calcification of the aortic valve. There is moderate thickening of the aortic valve. Aortic valve regurgitation is moderate. Moderate to severe aortic stenosis is present. Aortic valve mean gradient measures 17.7 mmHg. Aortic valve peak gradient measures 31.7 mmHg. Aortic valve area, by VTI measures 0.91 cm. Pulmonic Valve: The pulmonic valve was grossly normal. Pulmonic valve regurgitation is trivial. No evidence of pulmonic stenosis. Aorta: The aortic root and ascending aorta are structurally normal, with no evidence of dilitation. Venous: The inferior vena cava is normal in size with greater than 50% respiratory variability, suggesting right atrial pressure of 3 mmHg. IAS/Shunts: No atrial level shunt detected by color flow Doppler.  LEFT VENTRICLE PLAX 2D LVIDd:         4.20 cm   Diastology LVIDs:         3.60 cm   LV e' medial:    5.44 cm/s LV PW:         1.10 cm   LV E/e' medial:  23.7 LV IVS:        1.00 cm   LV e' lateral:   7.94 cm/s LVOT diam:     2.00 cm   LV E/e' lateral: 16.2 LV SV:         44 LV SV Index:   26 LVOT  Area:     3.14 cm  RIGHT VENTRICLE             IVC RV S prime:     12.90 cm/s  IVC diam: 1.60 cm TAPSE (M-mode): 1.8 cm LEFT ATRIUM             Index        RIGHT ATRIUM           Index LA diam:        3.60 cm 2.10 cm/m   RA Area:     13.20 cm LA Vol (A2C):   63.5 ml 37.00 ml/m  RA Volume:   34.40 ml  20.04 ml/m LA Vol (A4C):   57.1 ml 33.27 ml/m LA Biplane Vol: 63.1 ml 36.76 ml/m  AORTIC VALVE AV Area (Vmax):    0.93 cm AV Area (Vmean):   0.98 cm AV Area (VTI):     0.91 cm AV Vmax:           281.33 cm/s AV Vmean:          194.000 cm/s AV VTI:            0.486 m AV Peak Grad:      31.7 mmHg AV Mean Grad:      17.7 mmHg LVOT Vmax:         83.60 cm/s LVOT Vmean:        60.300 cm/s LVOT VTI:          0.141 m LVOT/AV VTI ratio: 0.29  AORTA Ao Root diam: 3.00 cm Ao Asc diam:  3.40 cm MITRAL VALVE                TRICUSPID VALVE MV Area (PHT): 5.42 cm     TR Peak grad:   29.2 mmHg MV Decel Time: 140 msec     TR Vmax:        270.00 cm/s MV E velocity: 129.00 cm/s MV A velocity: 91.40 cm/s   SHUNTS MV E/A ratio:  1.41  Systemic VTI:  0.14 m                             Systemic Diam: 2.00 cm Jerel Balding MD Electronically signed by Jerel Balding MD Signature Date/Time: 10/25/2023/10:03:35 AM    Final    CARDIAC CATHETERIZATION Addendum Date: 10/24/2023   Mid LM to Prox LAD lesion is 95% stenosed with 90% stenosed side branch in Ost Cx to Prox Cx.   Prox LAD to Mid LAD lesion is 100% stenosed with 99% stenosed side branch in 2nd Diag.   Previously placed mid RCA to Dist RCA DES stent is 30% stenosed.  Previously placed Prox RCA DES stent of is widely patent.   LIMA-LAD and is very large, and existing no disease.  Retrograde flow to the LAD fills a diagonal branch and antegrade flow reaches the apex.  No competitive flow.   2nd Mrg lesion is 100% stenosed.   Prox Graft to Mid Graft lesion is 90% stenosed.  (Lesion #3)   A drug-eluting stent was successfully placed, overlapping with previous distal stent  and covering the lesion, using a STENT SYNERGY XD 3.0X32.  Deployed to 3.3 mm with stent balloon.   Post intervention, there is a 0% residual stenosis.   Mid Graft lesion is 80% stenosed (lesion #4.)-Focal in-stent restenosis but otherwise the stent is widely patent.  Scoring balloon angioplasty was performed using a BALLOON SCOREFLEX 3.0X10.  Post intervention, there is a 10% residual stenosis.   Dist Graft lesion is 45% stenosed.   Prox Graft-2 lesion is 60% stenosed.  Prox Graft-1 lesion is 25% stenosed.   A drug-eluting stent was successfully placed overlapping the previous ostial and proximal stent and the newly placed stent, using a STENT SYNERGY XD 3.0X24.  Deployed to 3.3 mm. Post intervention, there is a 0% residual stenosis.   Postintervention, there is TIMI-3 flow maintained throughout the SVG-OM 2 graft   ---------------------------------------   Hemodynamic findings consistent with mild pulmonary hypertension.   There is moderate aortic valve stenosis.  Mean gradient estimated 24.3 mmHg.  Trivial from estimated based on right heart cath numbers Diagnostic  Dominance: Right      Intervention    POST-CATH DIAGNOSES Severe Native Left Coronary Artery Disease with essentially occluded LCx and LAD, very small caliber ramus and OM1 remain within 90% distal LM stenosis. Patent RCA stents with mild stenosis between the stents but otherwise stable flow. Patent LIMA to LAD SVG-OM has ostial 70% stenosis, mid graft there is tandem 60 and 90% stenosis as well as 80% ISR in the proximal edge of the more distal stent. - Successful score flex angioplasty of the ISR and PTCA-DES PCI of the vein graft connecting the 2 previous stent using 2 overlapping Synergy Stents (3.0 mm x 32 Owen 3.0 mm x 24 mm with all stents deployed to roughly 3.3 mm using stent balloon for overlap post dilation all the way to the ostium. Right Heart Cath: RAP mean 12 mmHg, RVEDP-EDP 58/10-15 mmHg; PAP-mean 43/19-29 mmHg, PCWP 17 mmHg; LV P-EDP  162/1-23 mmHg, AO P-MAP 126/50-81 mmHg. AoV: PSV gradient 38 mm 3 with a mean of 24 mmHg. (264 ms)-moderate stenosis. Ao sat 98%, PA sat 61%. Fick Cardiac Output-Index 3.76-2.17-moderately reduced. RECOMMENDATIONS   Anticipated discharge date to be determined.   Patient will need to have echocardiogram completed and reviewed to reassess aortic valve. Titrate GDMT based on echo results and CAD.   Recommend uninterrupted  dual antiplatelet therapy with Aspirin  81mg  daily and Ticagrelor  90mg  twice daily for a minimum of 12 months (ACS-Class I recommendation). Alm MICAEL Clay, MD, MS Alm Clay, M.D., M.S. Interventional Cardiologist [facility] Pager # [phone]   Result Date: 10/24/2023 Images from the original result were not included.   Mid LM to Prox LAD lesion is 95% stenosed with 90% stenosed side branch in Ost Cx to Prox Cx.   Prox LAD to Mid LAD lesion is 100% stenosed with 99% stenosed side branch in 2nd Diag.   Previously placed mid RCA to Dist RCA DES stent is 30% stenosed.  Previously placed Prox RCA DES stent of is widely patent.   LIMA-LAD and is very large, and existing no disease.  Retrograde flow to the LAD fills a diagonal branch and antegrade flow reaches the apex.  No competitive flow.   2nd Mrg lesion is 100% stenosed.   ---------------------------------   SVG-OM2 graft was visualized by angiography and is large.  Multiple segment lesions noted including ISR of the ostial and mid vessel stents as well as Denovo stenosis in the intervening segment.   Origin to Prox Graft stent is 70% stenosed (Lesion #1).  Scoring balloon angioplasty was performed using a BALLOON SCOREFLEX 3.0X10.  Reduced to 10% residual stenosis.   Prox Graft-2 lesion is 60% stenosed.  (Lesion #2)   A drug-eluting stent was successfully placed using a STENT SYNERGY XD 3.0X24.   Prox Graft to Mid Graft lesion is 90% stenosed.  (Lesion #3)   A drug-eluting stent was successfully placed, overlapping with previous  distal stent and covering the lesion, using a STENT SYNERGY XD 3.0X32.  Deployed to 3.3 mm with stent balloon.   Post intervention, there is a 0% residual stenosis.   Mid Graft lesion is 80% stenosed (lesion #4.)-Focal in-stent restenosis but otherwise the stent is widely patent.  Scoring balloon angioplasty was performed using a BALLOON SCOREFLEX 3.0X10.  Post intervention, there is a 0% residual stenosis.   Dist Graft lesion is 45% stenosed.   Prox Graft-1 lesion is 25% stenosed.   ---------------------------------------   Hemodynamic findings consistent with mild pulmonary hypertension.   There is moderate aortic valve stenosis.  Mean gradient estimated 24.3 mmHg.  Trivial from estimated based on right heart cath numbers   Post intervention, there is a 0% residual stenosis. Diagnostic  Dominance: Right      Intervention    POST-CATH DIAGNOSES Severe Native Left Coronary Artery Disease with essentially occluded LCx and LAD, very small caliber ramus and OM1 remain within 90% distal LM stenosis. Patent RCA stents with mild stenosis between the stents but otherwise stable flow. Patent LIMA to LAD SVG-OM has ostial 70% stenosis, mid graft there is tandem 60 and 90% stenosis as well as 80% ISR in the proximal edge of the more distal stent. - Successful score flex angioplasty of the ISR and PTCA-DES PCI of the vein graft connecting the 2 previous stent using 2 overlapping Synergy Stents (3.0 mm x 32 Owen 3.0 mm x 24 mm with all stents deployed to roughly 3.3 mm using stent balloon for overlap post dilation all the way to the ostium. Right Heart Cath: RAP mean 12 mmHg, RVEDP-EDP 58/10-15 mmHg; PAP-mean 43/19-29 mmHg, PCWP 17 mmHg; LV P-EDP 162/1-23 mmHg, AO P-MAP 126/50-81 mmHg. AoV: PSV gradient 38 mm 3 with a mean of 24 mmHg. (264 ms)-moderate stenosis. Ao sat 98%, PA sat 61%. Fick Cardiac Output-Index 3.76-2.17-moderately reduced. RECOMMENDATIONS   Anticipated discharge date  to be determined.   Patient will need to  have echocardiogram completed and reviewed to reassess aortic valve. Titrate GDMT based on echo results and CAD.   Recommend uninterrupted dual antiplatelet therapy with Aspirin  81mg  daily and Ticagrelor  90mg  twice daily for a minimum of 12 months (ACS-Class I recommendation). Alm MICAEL Clay, MD, MS Alm Clay, M.D., M.S. Interventional Cardiologist [facility] Pager # [phone]  DG Chest Port 1 View Result Date: 10/23/2023 CLINICAL DATA:  Worsening angina with sciatica and shortness of breath. EXAM: PORTABLE CHEST 1 VIEW COMPARISON:  March 29, 2022 FINDINGS: Multiple sternal wires and vascular clips are present. The heart size and mediastinal contours are within normal limits. A coronary artery stent is in place. Mild to moderate severity areas of scarring, atelectasis and/or infiltrate are seen within the bilateral lung bases. There is a small right pleural effusion. No pneumothorax is identified. Postoperative changes are seen involving the cervical spine. No acute osseous abnormalities are present. IMPRESSION: 1. Evidence of prior median sternotomy/CABG. 2. Mild to moderate severity bibasilar scarring, atelectasis and/or infiltrate. 3. Small right pleural effusion. Electronically Signed   By: Suzen Dials M.D.   On: 10/23/2023 19:46   CT Angio Chest PE W and/or Wo Contrast Result Date: 10/23/2023 CLINICAL DATA:  PE suspected. History of blood clots. Exertional shortness of breath and flushing. Possible superior vena cava syndrome. Two EXAM: CT ANGIOGRAPHY CHEST WITH CONTRAST TECHNIQUE: Multidetector CT imaging of the chest was performed using the standard protocol during bolus administration of intravenous contrast. Multiplanar CT image reconstructions and MIPs were obtained to evaluate the vascular anatomy. RADIATION DOSE REDUCTION: This exam was performed according to the departmental dose-optimization program which includes automated exposure control, adjustment of the mA and/or  kV according to patient size and/or use of iterative reconstruction technique. CONTRAST:  75mL OMNIPAQUE  IOHEXOL  350 MG/ML SOLN COMPARISON:  Chest radiograph 10/23/2023 and CT chest 03/30/2022 FINDINGS: Cardiovascular: Cardiomegaly. No pericardial effusion. Negative for pulmonary embolism. Coronary artery and aortic atherosclerotic calcification. Sternotomy and CABG. Right arm venous contrast injection flows through the SVC. No evidence of clot within the SVC noting mixing artifact and streak artifact from dense contrast. Mediastinum/Nodes: Trachea and esophagus are unremarkable. No thoracic adenopathy. Lungs/Pleura: Diffuse interlobular septal thickening and bronchial wall thickening greatest in the lower lungs. Scattered confluent subpleural opacities in the lower lungs. Small right and trace left pleural effusions. No pneumothorax. Upper Abdomen: No acute abnormality. Musculoskeletal: No acute fracture. Review of the MIP images confirms the above findings. IMPRESSION: 1. Negative for pulmonary embolism or SVC syndrome. 2. Cardiomegaly with pulmonary edema. Small right and trace left pleural effusions. 3. Aortic Atherosclerosis (ICD10-I70.0). Electronically Signed   By: Norman Gatlin M.D.   On: 10/23/2023 19:25    Disposition Pt is being discharged home today in good condition.  Follow-up Plans & Appointments  Follow-up Information     [facility]   [facility] Contact information: [address] [employer] [place] [address] [phone]         Care, [facility] Follow up.   Why: Home Health RN, Physical Therapy and Occupational Therapy-agency will call to arrange appointment Contact information: [address] [address] [phone]                Discharge Instructions     AMB referral to Phase II Cardiac Rehabilitation   Complete by: As directed     Diagnosis: NSTEMI  After initial evaluation and assessments completed: Virtual Based Care may be provided alone or in conjunction with Phase 2 Cardiac Rehab based on patient barriers.: Yes   Intensive Cardiac Rehabilitation (ICR) [facility] location only OR Traditional Cardiac Rehabilitation (TCR) *If criteria for ICR are not met will enroll in TCR [facility] only): Yes   Diet - low sodium heart healthy   Complete by: As directed    Discharge instructions   Complete by: As directed    PLEASE DO NOT MISS ANY DOSES OF YOUR PLAVIX !!!!! Also keep a log of you blood pressures and bring back to your follow up appt. Please call the office with any questions.   Patients taking blood thinners should generally stay away from medicines like ibuprofen, Advil, Motrin, naproxen, and Aleve due to risk of stomach bleeding. You may take Tylenol  as directed or talk to your primary doctor about alternatives.  PLEASE ENSURE THAT YOU DO NOT RUN OUT OF YOUR PLAVIX . This medication is very important to remain on for at least one year. IF you have issues obtaining this medication due to cost please CALL the office 3-5 business days prior to running out in order to prevent missing doses of this medication.    ACTIVITY AND EXERCISE  Daily activity and exercise are an important part of your recovery. People recover at different rates depending on their general health and type of valve procedure.  Most people recovering from TAVR feel better relatively quickly   No lifting, pushing, pulling more than 10 pounds (examples to avoid: groceries, vacuuming, gardening, golfing):             - For one week with a procedure through the groin.             - For six weeks for procedures through the chest wall or neck. NOTE: You will typically see one of our providers 7-14 days after your procedure to discuss WHEN TO RESUME the above activities.      DRIVING  Do not drive until you are seen for follow up and cleared by a provider.  Generally, we ask patient to not drive for 1 week after their procedure.  If you have been told by your doctor in the past that you may not drive, you must talk with him/her before you begin driving again.   DRESSING  Groin site: you may leave the clear dressing over the site for up to one week or until it falls off.   HYGIENE  If you had a femoral (leg) procedure, you may take a shower when you return home. After the shower, pat the site dry. Do NOT use powder, oils or lotions in your groin area until the site has completely healed.  If you had a chest procedure, you may shower when you return home unless specifically instructed not to by your discharging practitioner.             - DO NOT scrub incision; pat dry with a towel.             - DO NOT apply any lotions, oils, powders to the incision.             - No tub baths / swimming for at least 2 weeks.  If you notice any fevers, chills, increased pain, swelling, bleeding or pus, please contact your doctor.   ADDITIONAL INFORMATION  If you are going to have an upcoming dental procedure, please contact our office as you will require antibiotics  ahead of time to prevent infection on your heart valve.    If you have any questions or concerns you can call the structural heart phone during normal business hours 8am-4pm. If you have an urgent need after hours or weekends please call [phone] to talk to the on call provider for general cardiology. If you have an emergency that requires immediate attention, please call 911.    After TAVR Checklist  Check  Test Description  Follow up appointment in 1-2 weeks  You will see our structural heart advanced practice provider. Your incision sites will be checked and you will be cleared to drive and resume all normal activities if you are doing well.    1 month echo and follow up  You will have an echo to check on your new heart valve and be seen back in the office by a structural heart advanced  practice provider.  Follow up with your primary cardiologist You will need to be seen by your primary cardiologist in the following 3-6 months after your 1 month appointment in the valve clinic.   1 year echo and follow up You will have another echo to check on your heart valve after 1 year and be seen back in the office by a structural heart advanced practice provider. This your last structural heart visit.  Bacterial endocarditis prophylaxis  You will have to take antibiotics for the rest of your life before all dental procedures (even teeth cleanings) to protect your heart valve. Antibiotics are also required before some surgeries. Please check with your cardiologist before scheduling any surgeries. Also, please make sure to tell us  if you have a penicillin allergy as you will require an alternative antibiotic.   Face-to-face encounter (required for Medicare/Medicaid patients)   Complete by: As directed    I Manuelita Rummer certify that this patient is under my care and that I, or a nurse practitioner or physician's assistant working with me, had a face-to-face encounter that meets the physician face-to-face encounter requirements with this patient on 11/08/2023. The encounter with the patient was in whole, or in part for the following medical condition(s) which is the primary reason for home health care (List medical condition): CAD, Vavular disease, CHF   The encounter with the patient was in whole, or in part, for the following medical condition, which is the primary reason for home health care: CAD, Vavular disease, CHF   I certify that, based on my findings, the following services are medically necessary home health services:  Nursing Physical therapy     Reason for Medically Necessary Home Health Services: Other See Comments   My clinical findings support the need for the above services: OTHER SEE COMMENTS   Further, I certify that my clinical findings support that this patient is homebound due  to: Ambulates short distances less than 300 feet   Home Health   Complete by: As directed    To provide the following care/treatments:  PT OT RN     Increase activity slowly   Complete by: As directed    No wound care   Complete by: As directed        Discharge Medications Allergies as of 11/08/2023       Reactions   Tetracyclines & Related Hives, Itching   Cozaar [losartan] Cough   Zestril  [lisinopril ] Cough   Lipitor [atorvastatin] Other (See Comments)   Myalgias         Medication List     STOP taking these  medications    APPLE CIDER VINEGAR PO   isosorbide  mononitrate 30 MG 24 hr tablet Commonly known as: IMDUR    SAW PALMETTO PO   ticagrelor  90 MG Tabs tablet Commonly known as: Brilinta    TURMERIC (CURCUMIN) PO       TAKE these medications    acetaminophen  500 MG tablet Commonly known as: TYLENOL  Take 500 mg by mouth 2 (two) times daily.   apixaban  2.5 MG Tabs tablet Commonly known as: ELIQUIS  Take 1 tablet (2.5 mg total) by mouth 2 (two) times daily.   aspirin  EC 81 MG tablet Take 1 tablet (81 mg total) by mouth daily at 12 noon.   BIOTIN PO Take 1 tablet by mouth daily at 12 noon.   carboxymethylcellulose 0.5 % Soln Commonly known as: REFRESH PLUS Place 1 drop into both eyes See admin instructions. Administer 1 drop into each eye every daily. May use every 12 hours if needed for dry eyes.   clopidogrel  75 MG tablet Commonly known as: PLAVIX  Take 1 tablet (75 mg total) by mouth daily. Start taking on: November 09, 2023   COQ10 PO Take 1 capsule by mouth daily at 12 noon.   diclofenac Sodium 1 % Gel Commonly known as: VOLTAREN 1 Application every 6 (six) hours as needed for pain.   DULoxetine  30 MG capsule Commonly known as: CYMBALTA  Take 30 mg by mouth at bedtime.   empagliflozin  10 MG Tabs tablet Commonly known as: JARDIANCE  Take 1 tablet (10 mg total) by mouth daily.   fluticasone  50 MCG/ACT nasal spray Commonly known as:  FLONASE  Place 1-2 sprays into both nostrils daily as needed for allergies or rhinitis.   ketoconazole 2 % shampoo Commonly known as: NIZORAL Apply 1 Application topically every 3 (three) days.   ketoconazole 2 % cream Commonly known as: NIZORAL Apply 1 Application topically 2 (two) times daily as needed for irritation.   L-CARNITINE PO Take 2 tablets by mouth daily at 12 noon.   lidocaine  5 % Commonly known as: LIDODERM  Place 1 patch onto the skin daily as needed (pain).   loratadine  10 MG tablet Commonly known as: CLARITIN  Take 10 mg by mouth daily at 12 noon.   MAGNESIUM  PO Take 2 tablets by mouth daily at 12 noon.   metFORMIN  500 MG tablet Commonly known as: GLUCOPHAGE  Take 1,000 mg by mouth 2 (two) times daily as needed (BS > 140).   metoprolol  succinate 25 MG 24 hr tablet Commonly known as: TOPROL -XL Take 1 tablet (25 mg total) by mouth at bedtime. What changed:  medication strength how much to take when to take this additional instructions   MILK THISTLE PO Take 1 tablet by mouth daily at 12 noon.   nitroGLYCERIN  0.4 MG SL tablet Commonly known as: NITROSTAT  Place 1 tablet (0.4 mg total) under the tongue See admin instructions. Take 1 tablet (0.4) mg by mouth every day. May take an additional 1 tablet later in the day if  chest pain.   OVER THE COUNTER MEDICATION Take 1 capsule by mouth daily at 12 noon. VisiUltra   OVER THE COUNTER MEDICATION Take 1 capsule by mouth daily at 12 noon. blood boost supplement for glucose control   PreserVision AREDS 2 Caps Take 1 capsule by mouth 2 (two) times daily.   PROBIOTIC PO Take 2 capsules by mouth daily at 12 noon.   QUERCETIN PO Take 1 tablet by mouth daily at 12 noon.   rosuvastatin  40 MG tablet Commonly known as: CRESTOR  Take  1 tablet (40 mg total) by mouth daily. What changed: when to take this   SELENIUM PO Take 1 tablet by mouth daily at 12 noon.   spironolactone  25 MG tablet Commonly known as:  ALDACTONE  Take 0.5 tablets (12.5 mg total) by mouth daily.   Synthroid  75 MCG tablet Generic drug: levothyroxine  Take 75 mcg by mouth daily before breakfast.   VITAMIN B12 PO Take 1 tablet by mouth daily at 12 noon.   VITAMIN D-3 PO Take 1 tablet by mouth daily at 12 noon.               Durable Medical Equipment  (From admission, onward)           Start     Ordered   11/07/23 1602  For home use only DME 4 wheeled rolling walker with seat  Once       Question Answer Comment  Patient needs a walker to treat with the following condition S/P TAVR (transcatheter aortic valve replacement)   Patient needs a walker to treat with the following condition Heart failure (HCC)      11/07/23 1607             Outstanding Labs/Studies  FLP/LFTs in 8 weeks BMET at follow up  Duration of Discharge Encounter: APP Time: 25 minutes   Signed, Manuelita Rummer, NP 11/08/2023, 11:01 AM

## 2023-11-08 ENCOUNTER — Other Ambulatory Visit (HOSPITAL_COMMUNITY): Payer: Self-pay

## 2023-11-08 DIAGNOSIS — I5043 Acute on chronic combined systolic (congestive) and diastolic (congestive) heart failure: Secondary | ICD-10-CM

## 2023-11-08 DIAGNOSIS — I471 Supraventricular tachycardia, unspecified: Secondary | ICD-10-CM | POA: Insufficient documentation

## 2023-11-08 LAB — CBC
HCT: 33.2 % — ABNORMAL LOW (ref 39.0–52.0)
Hemoglobin: 10.7 g/dL — ABNORMAL LOW (ref 13.0–17.0)
MCH: 28.3 pg (ref 26.0–34.0)
MCHC: 32.2 g/dL (ref 30.0–36.0)
MCV: 87.8 fL (ref 80.0–100.0)
Platelets: 221 K/uL (ref 150–400)
RBC: 3.78 MIL/uL — ABNORMAL LOW (ref 4.22–5.81)
RDW: 13.2 % (ref 11.5–15.5)
WBC: 7.4 K/uL (ref 4.0–10.5)
nRBC: 0 % (ref 0.0–0.2)

## 2023-11-08 LAB — BASIC METABOLIC PANEL WITH GFR
Anion gap: 11 (ref 5–15)
BUN: 22 mg/dL (ref 8–23)
CO2: 24 mmol/L (ref 22–32)
Calcium: 9.2 mg/dL (ref 8.9–10.3)
Chloride: 100 mmol/L (ref 98–111)
Creatinine, Ser: 1.49 mg/dL — ABNORMAL HIGH (ref 0.61–1.24)
GFR, Estimated: 47 mL/min — ABNORMAL LOW (ref >60)
Glucose, Bld: 183 mg/dL — ABNORMAL HIGH (ref 70–99)
Potassium: 4.8 mmol/L (ref 3.5–5.1)
Sodium: 135 mmol/L (ref 135–145)

## 2023-11-08 LAB — GLUCOSE, CAPILLARY: Glucose-Capillary: 161 mg/dL — ABNORMAL HIGH (ref 70–99)

## 2023-11-08 MED ORDER — METOPROLOL SUCCINATE ER 25 MG PO TB24
25.0000 mg | ORAL_TABLET | Freq: Every day | ORAL | 1 refills | Status: AC
  Filled 2023-11-08: qty 30, 30d supply, fill #0

## 2023-11-08 MED ORDER — SPIRONOLACTONE 25 MG PO TABS
12.5000 mg | ORAL_TABLET | Freq: Every day | ORAL | 0 refills | Status: AC
  Filled 2023-11-08: qty 15, 30d supply, fill #0

## 2023-11-08 MED ORDER — ASPIRIN EC 81 MG PO TBEC
81.0000 mg | DELAYED_RELEASE_TABLET | Freq: Every day | ORAL | 0 refills | Status: DC
  Filled 2023-11-08: qty 14, 14d supply, fill #0

## 2023-11-08 MED ORDER — CLOPIDOGREL BISULFATE 75 MG PO TABS
75.0000 mg | ORAL_TABLET | Freq: Every day | ORAL | 2 refills | Status: AC
  Filled 2023-11-08: qty 30, 30d supply, fill #0

## 2023-11-08 MED ORDER — APIXABAN 2.5 MG PO TABS
2.5000 mg | ORAL_TABLET | Freq: Two times a day (BID) | ORAL | 2 refills | Status: AC
  Filled 2023-11-08: qty 60, 30d supply, fill #0

## 2023-11-08 MED FILL — Epinephrine IV Soln Prefilled Syringe 1 MG/10ML (0.1 MG/ML): INTRAVENOUS | Qty: 10 | Status: AC

## 2023-11-08 NOTE — TOC Progression Note (Addendum)
 Transition of Care [facility]) - Progression Note    Patient Details  Name: Luke Harvey MRN: [id number] Date of Birth: [birth year]  Transition of Care [facility]) CM/SW Contact  Montie LOISE Louder, [place] Phone Number: 11/08/2023, 10:08 AM  Clinical Narrative:     Received message daughter wants CSW to call. CSW spoke with [name]- CSW answered questions regarding [facility] benefits, SNF and HH. CSW encourage daughter to contact [facility] SW to inquire about any additional services patient may be eligible to receive. She states understanding and appreciates information provided.   She confirmed she will transport patient home once he ready for d/c.  Montie Louder, MSW, LCSW Clinical Social Worker     Expected Discharge Plan: Home w Home Health Services Barriers to Discharge: Continued Medical Work up  Expected Discharge Plan and Services In-house Referral: NA Discharge Planning Services: CM Consult Post Acute Care Choice: Home Health Living arrangements for the past 2 months: Single Family Home                   DME Agency: NA       HH Arranged: RN, PT, OT HH Agency: [facility] Date HH Agency Contacted: 11/07/23 Time HH Agency Contacted: 1536 Representative spoke with at [facility] Agency: [place]   Social Determinants of Health (SDOH) Interventions SDOH Screenings   Food Insecurity: No Food Insecurity (10/29/2023)  Housing: Low Risk  (10/29/2023)  Transportation Needs: No Transportation Needs (10/29/2023)  Utilities: Not At Risk (10/29/2023)  Depression (PHQ2-9): Low Risk  (07/16/2020)  Social Connections: Unknown (10/30/2023)  Tobacco Use: Medium Risk (11/04/2023)    Readmission Risk Interventions    11/02/2023   12:38 PM  Readmission Risk Prevention Plan  Transportation Screening Complete  PCP or Specialist Appt within 3-5 Days Complete  HRI or Home Care Consult Complete  Palliative Care Screening Not Applicable  Medication Review (RN Care Manager) Complete

## 2023-11-08 NOTE — Progress Notes (Signed)
 Physical Therapy Treatment Patient Details Name: Luke Harvey MRN: [id number] DOB: June 28, 1945 Today's Date: 11/08/2023   History of Present Illness Pt is a 78 y.o. male admitted 10/29/23 due to episodic SOB. Pt with severe LFLG AS. Chest x-ray showed R>L pleural effusion and interstitial edema. 7/9 R thoracentesis. 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 PMH: CAD, CABG x2, s/p stent to SVG, severe native vessel disease, ischemic cardiomyopathy, HTN, HLD, history of DVT    PT Comments  Making good progress. Improved functional capacity with increased ambulatory distance tolerated and reduced dyspnea (mild.) Reviewed safe stair navigation techniques again. Performed without physical assist and good recall of sequencing. Still recommended not to perform alone, and he agrees that daughter can help until HHPT progresses him further. Patient will continue to benefit from skilled physical therapy services to further improve independence with functional mobility.     If plan is discharge home, recommend the following: Assistance with cooking/housework;Direct supervision/assist for medications management;Assist for transportation;Help with stairs or ramp for entrance;Direct supervision/assist for financial management   Can travel by private vehicle        Equipment Recommendations  Rollator (4 wheels)    Recommendations for Other Services       Precautions / Restrictions Precautions Precautions: Fall Recall of Precautions/Restrictions: Intact Precaution/Restrictions Comments: pt with breathing attacks SpO2 100% Restrictions Weight Bearing Restrictions Per Provider Order: No     Mobility  Bed Mobility Overal bed mobility: Modified Independent             General bed mobility comments: extra time, no assist.    Transfers Overall transfer level: Needs assistance Equipment used: Rolling walker (2 wheels), None, Straight cane Transfers: Sit to/from Stand, Bed to  chair/wheelchair/BSC Sit to Stand: Supervision           General transfer comment: Supervision for safety with and without assistive device. More stable with RW or SPC. Performed from bed and toilet. No assist.    Ambulation/Gait Ambulation/Gait assistance: Supervision Gait Distance (Feet): 160 Feet Assistive device: Rolling walker (2 wheels), Straight cane, None Gait Pattern/deviations: Step-through pattern, Decreased stride length, Narrow base of support, Trunk flexed Gait velocity: decreased     General Gait Details: Most stable with RW, and this device also conserves more engery compared to [facility] use. Attempted short distance without AD however shows antalgic pattern, further reduction in speed, and increased instability. Supervision for safety with AD. Cues for symptom awareness. HR to 118.   Stairs Stairs: Yes Stairs assistance: Contact guard assist Stair Management: One rail Left, Step to pattern, Forwards, With cane Number of Stairs: 3 (x2) General stair comments: CGA for safety. Good recall of sequencing. Set-up simulated to home entrance. Feels more confident today. No physical assist.   Wheelchair Mobility     Tilt Bed    Modified Rankin (Stroke Patients Only)       Balance Overall balance assessment: Needs assistance, History of Falls Sitting-balance support: Feet supported, No upper extremity supported Sitting balance-Leahy Scale: Good     Standing balance support: During functional activity, No upper extremity supported Standing balance-Leahy Scale: Fair Standing balance comment: More stable with AD for support.                            Communication Communication Communication: Impaired Factors Affecting Communication: Hearing impaired  Cognition Arousal: Alert Behavior During Therapy: WFL for tasks assessed/performed   PT - Cognitive impairments: No family/caregiver  present to determine baseline                          Following commands: Intact      Cueing Cueing Techniques: Verbal cues  Exercises      General Comments General comments (skin integrity, edema, etc.): Minimal dyspnea today. Denies breathing attack. HR to 118 at max with activity.      Pertinent Vitals/Pain Pain Assessment Pain Assessment: No/denies pain    Home Living                          Prior Function            PT Goals (current goals can now be found in the care plan section) Acute Rehab PT Goals Patient Stated Goal: to go back home PT Goal Formulation: With patient Time For Goal Achievement: 11/16/23 Potential to Achieve Goals: Good Progress towards PT goals: Progressing toward goals    Frequency    Min 2X/week      PT Plan      Co-evaluation              AM-PAC PT 6 Clicks Mobility   Outcome Measure  Help needed turning from your back to your side while in a flat bed without using bedrails?: None Help needed moving from lying on your back to sitting on the side of a flat bed without using bedrails?: A Little Help needed moving to and from a bed to a chair (including a wheelchair)?: A Little Help needed standing up from a chair using your arms (e.g., wheelchair or bedside chair)?: A Little Help needed to walk in hospital room?: A Little Help needed climbing 3-5 steps with a railing? : A Little 6 Click Score: 19    End of Session Equipment Utilized During Treatment: Gait belt Activity Tolerance: Patient tolerated treatment well Patient left: in chair;with call bell/phone within reach   PT Visit Diagnosis: Other abnormalities of gait and mobility (R26.89);Muscle weakness (generalized) (M62.81);Unsteadiness on feet (R26.81)     Time: 9089-9060 PT Time Calculation (min) (ACUTE ONLY): 29 min  Charges:    $Gait Training: 8-22 mins $Therapeutic Activity: 8-22 mins PT General Charges $$ ACUTE PT VISIT: 1 Visit                     Leontine Roads, PT, DPT [facility]   Rehabilitation Services Physical Therapist Office: [phone] Website: [email or web address]    Leontine GORMAN Roads 11/08/2023, 10:58 AM

## 2023-11-08 NOTE — TOC Transition Note (Signed)
 Transition of Care (TOC) - Discharge Note Rayfield Gobble RN, BSN Inpatient Care Management Unit 4E- RN Case Manager See Treatment Team for direct phone #   Patient Details  Name: Luke Harvey MRN: [id number] Date of Birth: Aug 21, 1945  Transition of Care [facility]) CM/SW Contact:  Gobble Rayfield Hurst, RN Phone Number: 11/08/2023, 11:38 AM   Clinical Narrative:    Pt stable for transition home today, daughter to transport home.  Orders placed for HHRN/PT/OT- [facility] liaison notified and will [facility] with [facility]. Per liaison [place] she has spoken with daughter this am.   Per previous CM note- DME-rollator- request faxed to [place] on 7/14.   No further TOC needs noted.    Final next level of care: Home w Home Health Services Barriers to Discharge: Barriers Resolved   Patient Goals and CMS Choice Patient states their goals for this hospitalization and ongoing recovery are:: wants to go home CMS Medicare.gov Compare Post Acute Care list provided to:: Patient Represenative (must comment) Choice offered to / list presented to : Adult Children      Discharge Placement                 Home w/ [facility]      Discharge Plan and Services Additional resources added to the After Visit Summary for   In-house Referral: NA Discharge Planning Services: CM Consult Post Acute Care Choice: Home Health          DME Arranged: Walker rolling with seat DME Agency: [facility] [place] Date DME Agency Contacted: 11/07/23   Representative spoke with at DME Agency: faxed to [facility] HH Arranged: RN, PT, OT [facility] Agency: [facility] Date [facility] Agency Contacted: 11/07/23 Time HH Agency Contacted: 1536 Representative spoke with at [facility] Agency: [place]  Social Drivers of Health (SDOH) Interventions SDOH Screenings   Food Insecurity: No Food Insecurity (10/29/2023)  Housing: Low Risk  (10/29/2023)  Transportation Needs: No Transportation Needs (10/29/2023)  Utilities: Not At Risk (10/29/2023)   Depression (PHQ2-9): Low Risk  (07/16/2020)  Social Connections: Unknown (10/30/2023)  Tobacco Use: Medium Risk (11/04/2023)     Readmission Risk Interventions    11/08/2023   11:38 AM 11/02/2023   12:38 PM  Readmission Risk Prevention Plan  Transportation Screening  Complete  PCP or Specialist Appt within 3-5 Days  Complete  HRI or Home Care Consult  Complete  Social Work Consult for Recovery Care Planning/Counseling Complete   Palliative Care Screening  Not Applicable  Medication Review [facility])  Complete

## 2023-11-08 NOTE — Progress Notes (Signed)
 Discharge instructions reviewed with pt.  Copy of instructions given to pt. [facility] has filled scripts for pt and will be picked up on the way to the discharge lounge. Pt has called daughter to pick him up, this nurse spoke with daughter and gave her directions to the discharge lounge entrance. Assisted pt getting dressed.  Pt will be d/c'd via wheelchair with belongings and will be escorted by staff to the discharge lounge.   Zamiah Tollett,RN SWOT

## 2023-11-09 ENCOUNTER — Telehealth (HOSPITAL_COMMUNITY): Payer: Self-pay

## 2023-11-09 NOTE — Telephone Encounter (Signed)
 Attempted to call patient regarding interest in cardiac rehab- no answer, left message to call us  back. Sent MyChart message.  Patient has f/u on 7/22 and 8/28.

## 2023-11-10 ENCOUNTER — Ambulatory Visit: Admitting: Physician Assistant

## 2023-11-11 ENCOUNTER — Encounter: Payer: Self-pay | Admitting: Advanced Practice Midwife

## 2023-11-15 ENCOUNTER — Encounter (HOSPITAL_COMMUNITY)

## 2023-11-16 ENCOUNTER — Telehealth: Payer: Self-pay

## 2023-11-16 ENCOUNTER — Institutional Professional Consult (permissible substitution): Admitting: Cardiovascular Disease

## 2023-11-16 ENCOUNTER — Telehealth: Payer: Self-pay | Admitting: Cardiovascular Disease

## 2023-11-16 DIAGNOSIS — I509 Heart failure, unspecified: Secondary | ICD-10-CM

## 2023-11-16 NOTE — Telephone Encounter (Signed)
 Paper Work Dropped Off:  Lawyer document  Date: 11/16/23  Location of paper:  Dr Wonda Box

## 2023-11-16 NOTE — Telephone Encounter (Signed)
 Forms obtained and reviewed with Dr wonda during clinic. He agrees that it would be very difficult and perhaps near impossible for this patient with his comorbidities to appear in court on 11/21/23. Recommends this be postponed until after his scheduled follow-up here on 12/22/23. Letter created via EPIC and faxed to attorney with originals and  confirmation received.

## 2023-11-17 ENCOUNTER — Telehealth: Payer: Self-pay

## 2023-11-17 NOTE — Progress Notes (Unsigned)
 Complex Care Management Note Care Guide Note  11/17/2023 Name: Luke Harvey MRN: [id number] DOB: 09-11-1945   Complex Care Management Outreach Attempts: An unsuccessful telephone outreach was attempted today to offer the patient information about available complex care management services.  Follow Up Plan:  Additional outreach attempts will be made to offer the patient complex care management information and services.   Encounter Outcome:  No Answer  Jeoffrey Buffalo , RMA     [place]  [facility], [facility] Guide  Direct Dial: [phone]  Website: [email or web address]

## 2023-11-18 NOTE — Telephone Encounter (Signed)
 Robert [facility]-Lawyer calling advising of doc attachment not rec'vd. Please email to [email or web address] and/or fax [phone]. Rec'vd email but no attachment

## 2023-11-18 NOTE — Progress Notes (Signed)
 Complex Care Management Note  Care Guide Note 11/18/2023 Name: JOSIAH NIETO MRN: [id number] DOB: 08-01-1945  INDIANA PECHACEK is a 78 y.o. year old male who sees [facility] for primary care. I reached out to Ubaldo LELON Odea by phone today to offer complex care management services.  Mr. Elison was given information about Complex Care Management services today including:   The Complex Care Management services include support from the care team which includes your Nurse Care Manager, Clinical Social Worker, or Pharmacist.  The Complex Care Management team is here to help remove barriers to the health concerns and goals most important to you. Complex Care Management services are voluntary, and the patient may decline or stop services at any time by request to their care team member.   Complex Care Management Consent Status: Patient agreed to services and verbal consent obtained.   Follow up plan:  Telephone appointment with complex care management team member scheduled for:  11/28/2023  Encounter Outcome:  Patient Scheduled  Jeoffrey Buffalo , RMA       [facility], [facility] Guide  Direct Dial: [phone]  Website: [email or web address]

## 2023-11-23 ENCOUNTER — Telehealth (HOSPITAL_COMMUNITY): Payer: Self-pay | Admitting: Family Medicine

## 2023-11-23 NOTE — Telephone Encounter (Signed)
 [phone] - - [name] Bunker (pt's daughter)   Pt's daughter wants to know if it's clinically necessary and if the office visit is covered through the [place] insurance? (Authorized visit).   Office visit was cancelled by [facility] nurse navigator and patients family member (daughter) has some concerns.  Please advise.   Thank you.

## 2023-11-23 NOTE — Telephone Encounter (Signed)
 Daughter aware [facility] shara is not on file at this time  HFU is recommended  for patients discharged from hospital within 14 days, with recent [place] cardiology appt 7/30 nurse navigator with [facility] cancelled appt at [facility]  Daughter does not wish to reschedule at this time. Will be sure to keep TAVR follow up

## 2023-11-24 ENCOUNTER — Encounter (HOSPITAL_COMMUNITY)

## 2023-11-28 ENCOUNTER — Other Ambulatory Visit: Payer: Self-pay

## 2023-11-28 NOTE — Patient Outreach (Signed)
 Complex Care Management   Visit Note  11/28/2023  Name:  Luke Harvey MRN: [id number] DOB: 1945/06/23  Situation: Referral received for Complex Care Management related to Impaired Mobility.  I obtained verbal consent from Caregiver.  Visit completed with [name] Bunker, daughter  on the phone. Main concern is getting some in-home assistance for Luke Harvey while he continues to heal from hospitalization, daughter lives 25 minutes away and works full-time, unable to be a full-time caregiver. He is currently receiving HHRN/PT/OT through [facility] but she feels he needs a little more supervision at this time, although he is progressing and feeling better each day.   Background:   Past Medical History:  Diagnosis Date   Age-related macular degeneration, dry, both eyes    Anxiety    Aortic stenosis    Arthritis    probably all over (06/08/2016)   Cardiomyopathy (HCC)    a. EF 40-45% in 2018, normalized in 03/2020.   Chronic back pain    started in my lower back; going up my back in the last couple months (06/08/2016)   Chronic sinusitis    Coronary artery disease    a. s/p CABGx 2V (2014 in [place])  b. PCI (2015 in [place] c. PCI 2018 ([facility]). d. PCI 03/2020 (complex - [facility]).   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 06/09/2016   NSTEMI (non-ST elevated myocardial infarction) (HCC) 06/08/2016   Pneumonia    3-4 times maybe (06/08/2016)   PVD (peripheral vascular disease) (HCC)    Recovering alcoholic (HCC)    picked up my 30 year chip the other day (06/08/2016)   S/P angioplasty with stent, 06/08/16 DES, for  in-stent restenosis in RCA. 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 Wonda and Dr. Shyrl.   Sleep apnea    got a mask after OR; don't use it (06/08/2016)   Type II diabetes mellitus (HCC)     Assessment: Patient Reported Symptoms:  Cognitive Cognitive Status: Alert and oriented to person, place, and time (Assessment performed with daughter)      Neurological Neurological Review of Symptoms: Dizziness (Occasional)    HEENT HEENT Symptoms Reported: Not assessed      Cardiovascular Cardiovascular Symptoms Reported: Lightheadness, Dizziness (Occasional)    Respiratory Respiratory Symptoms Reported: No symptoms reported    Endocrine Endocrine Symptoms Reported:  (A1C 7.6 on 11/03/23) Is patient diabetic?: Yes    Gastrointestinal Gastrointestinal Symptoms Reported: Not assessed      Genitourinary Genitourinary Symptoms Reported: Not assessed    Integumentary Integumentary Symptoms Reported: Not assessed    Musculoskeletal Musculoskelatal Symptoms Reviewed: Unsteady gait Additional Musculoskeletal Details: Patient is currently getting home health RN/PT/OT with [facility].  Uses cane when he feels more unsteady, has walker to use if needed.        Psychosocial       Quality of Family Relationships: helpful, involved, supportive Do you feel physically threatened by others?: No  07/16/2020    2:39 PM  Depression screen PHQ 2/9  Decreased Interest 0  Down, Depressed, Hopeless 0  PHQ - 2 Score 0    There were no vitals filed for this visit.  Medications Reviewed Today   Medications were not reviewed in this encounter     Recommendation:   -Patient and daughter will attend appointment with [facility] PCP on 11/29/23, they will also speak to a [place] SW regarding in-home assistance.   Follow Up Plan:   Telephone follow-up two weeks.   Santana Stamp BSN, CCM [place]  VBCI  Population Health RN Care Manager Direct Dial: [phone]  Fax: [phone]

## 2023-11-28 NOTE — Patient Instructions (Signed)
 Visit Information  Thank you for taking time to visit with me today. Please don't hesitate to contact me if I can be of assistance to you before our next scheduled appointment.  Our next appointment is by telephone on Friday, August 15th at 3:45pm.  Please call the care guide team at [phone] if you need to cancel or reschedule your appointment.   Following is a copy of your care plan:   Goals Addressed             This Visit's Progress    VBCI RN Care Plan       Problems:  Care Coordination needs related to Impaired Mobilty  Goal: Over the next 30 days the Caregiver Patient will demonstrate ongoing self health care management ability for Impaired Mobility as evidenced by    learning about resources for in-home assistance.   Interventions:   Falls Interventions: Provided written and verbal education re: potential causes of falls and Fall prevention strategies Advised patient to discuss in-home assistance benefits through the [place]  with provider Assessed social determinant of health barriers   Evaluation of current treatment plan related to Impaired Mobility, Limited access to caregiver self-management and patient's adherence to plan as established by provider. Discussed plans with patient for ongoing care management follow up and provided patient with direct contact information for care management team Provided contact information for [facility].   Patient Self-Care Activities:  Call provider office for new concerns or questions  Discuss needs with [facility] SW at appointment on 11/29/23 Shower with supervision, may take sponge bath alone. Speak to HH/OT for recommendations when using the shower.   Plan:  Telephone follow up appointment with care management team member scheduled for:  two weeks.              A reminder to ALL patients/family/friends, please call the [place]  National Suicide Prevention Lifeline: 479-420-6563 or TTY: 564 795 1711 TTY (442)738-0790) to  talk to a trained counselor if you are experiencing a Mental Health or Behavioral Health Crisis or need someone to talk to.  Patient verbalizes understanding of instructions and care plan provided today and agrees to view in MyChart. Active MyChart status and patient understanding of how to access instructions and care plan via MyChart confirmed with patient.     Santana Stamp BSN, CCM [place]  VBCI Population Health RN Care Manager Direct Dial: [phone]  Fax: [phone]'

## 2023-12-07 ENCOUNTER — Emergency Department (HOSPITAL_COMMUNITY)

## 2023-12-07 ENCOUNTER — Encounter (HOSPITAL_COMMUNITY): Payer: Self-pay

## 2023-12-07 ENCOUNTER — Other Ambulatory Visit: Payer: Self-pay

## 2023-12-07 ENCOUNTER — Emergency Department (HOSPITAL_COMMUNITY)
Admission: EM | Admit: 2023-12-07 | Discharge: 2023-12-08 | Disposition: A | Discharge status: Home / Self Care | Attending: Emergency Medicine | Admitting: Emergency Medicine

## 2023-12-07 DIAGNOSIS — Z79899 Other long term (current) drug therapy: Secondary | ICD-10-CM | POA: Insufficient documentation

## 2023-12-07 DIAGNOSIS — I4891 Unspecified atrial fibrillation: Secondary | ICD-10-CM | POA: Diagnosis not present

## 2023-12-07 DIAGNOSIS — I130 Hypertensive heart and chronic kidney disease with heart failure and stage 1 through stage 4 chronic kidney disease, or unspecified chronic kidney disease: Secondary | ICD-10-CM | POA: Diagnosis not present

## 2023-12-07 DIAGNOSIS — Z87891 Personal history of nicotine dependence: Secondary | ICD-10-CM | POA: Diagnosis not present

## 2023-12-07 DIAGNOSIS — I5021 Acute systolic (congestive) heart failure: Secondary | ICD-10-CM | POA: Diagnosis not present

## 2023-12-07 DIAGNOSIS — Z955 Presence of coronary angioplasty implant and graft: Secondary | ICD-10-CM | POA: Diagnosis not present

## 2023-12-07 DIAGNOSIS — N183 Chronic kidney disease, stage 3 unspecified: Secondary | ICD-10-CM | POA: Diagnosis not present

## 2023-12-07 DIAGNOSIS — Z7982 Long term (current) use of aspirin: Secondary | ICD-10-CM | POA: Diagnosis not present

## 2023-12-07 DIAGNOSIS — Z7901 Long term (current) use of anticoagulants: Secondary | ICD-10-CM | POA: Insufficient documentation

## 2023-12-07 DIAGNOSIS — Z7902 Long term (current) use of antithrombotics/antiplatelets: Secondary | ICD-10-CM | POA: Diagnosis not present

## 2023-12-07 DIAGNOSIS — E1122 Type 2 diabetes mellitus with diabetic chronic kidney disease: Secondary | ICD-10-CM | POA: Insufficient documentation

## 2023-12-07 DIAGNOSIS — Z96642 Presence of left artificial hip joint: Secondary | ICD-10-CM | POA: Insufficient documentation

## 2023-12-07 DIAGNOSIS — I2510 Atherosclerotic heart disease of native coronary artery without angina pectoris: Secondary | ICD-10-CM | POA: Insufficient documentation

## 2023-12-07 DIAGNOSIS — R0609 Other forms of dyspnea: Secondary | ICD-10-CM

## 2023-12-07 DIAGNOSIS — Z7984 Long term (current) use of oral hypoglycemic drugs: Secondary | ICD-10-CM | POA: Insufficient documentation

## 2023-12-07 DIAGNOSIS — R0602 Shortness of breath: Secondary | ICD-10-CM | POA: Diagnosis present

## 2023-12-07 DIAGNOSIS — I480 Paroxysmal atrial fibrillation: Secondary | ICD-10-CM | POA: Diagnosis not present

## 2023-12-07 LAB — CBC WITH DIFFERENTIAL/PLATELET
Abs Immature Granulocytes: 0.02 K/uL (ref 0.00–0.07)
Basophils Absolute: 0 K/uL (ref 0.0–0.1)
Basophils Relative: 1 %
Eosinophils Absolute: 0.2 K/uL (ref 0.0–0.5)
Eosinophils Relative: 4 %
HCT: 36.4 % — ABNORMAL LOW (ref 39.0–52.0)
Hemoglobin: 11.1 g/dL — ABNORMAL LOW (ref 13.0–17.0)
Immature Granulocytes: 0 %
Lymphocytes Relative: 27 %
Lymphs Abs: 1.6 K/uL (ref 0.7–4.0)
MCH: 27.9 pg (ref 26.0–34.0)
MCHC: 30.5 g/dL (ref 30.0–36.0)
MCV: 91.5 fL (ref 80.0–100.0)
Monocytes Absolute: 0.6 K/uL (ref 0.1–1.0)
Monocytes Relative: 11 %
Neutro Abs: 3.2 K/uL (ref 1.7–7.7)
Neutrophils Relative %: 57 %
Platelets: 193 K/uL (ref 150–400)
RBC: 3.98 MIL/uL — ABNORMAL LOW (ref 4.22–5.81)
RDW: 14.6 % (ref 11.5–15.5)
WBC: 5.7 K/uL (ref 4.0–10.5)
nRBC: 0 % (ref 0.0–0.2)

## 2023-12-07 LAB — TROPONIN I (HIGH SENSITIVITY)
Troponin I (High Sensitivity): 28 ng/L — ABNORMAL HIGH (ref <18)
Troponin I (High Sensitivity): 41 ng/L — ABNORMAL HIGH (ref <18)

## 2023-12-07 LAB — COMPREHENSIVE METABOLIC PANEL WITH GFR
ALT: 10 U/L (ref 0–44)
AST: 24 U/L (ref 15–41)
Albumin: 3.3 g/dL — ABNORMAL LOW (ref 3.5–5.0)
Alkaline Phosphatase: 73 U/L (ref 38–126)
Anion gap: 10 (ref 5–15)
BUN: 16 mg/dL (ref 8–23)
CO2: 20 mmol/L — ABNORMAL LOW (ref 22–32)
Calcium: 9.1 mg/dL (ref 8.9–10.3)
Chloride: 108 mmol/L (ref 98–111)
Creatinine, Ser: 1.03 mg/dL (ref 0.61–1.24)
GFR, Estimated: >60 mL/min (ref >60)
Glucose, Bld: 95 mg/dL (ref 70–99)
Potassium: 4.1 mmol/L (ref 3.5–5.1)
Sodium: 138 mmol/L (ref 135–145)
Total Bilirubin: 0.8 mg/dL (ref 0.0–1.2)
Total Protein: 6.5 g/dL (ref 6.5–8.1)

## 2023-12-07 LAB — RESP PANEL BY RT-PCR (RSV, FLU A&B, COVID)  RVPGX2
Influenza A by PCR: NEGATIVE
Influenza B by PCR: NEGATIVE
Resp Syncytial Virus by PCR: NEGATIVE
SARS Coronavirus 2 by RT PCR: NEGATIVE

## 2023-12-07 LAB — BRAIN NATRIURETIC PEPTIDE: B Natriuretic Peptide: 559.7 pg/mL — ABNORMAL HIGH (ref 0.0–100.0)

## 2023-12-07 MED ORDER — METOPROLOL TARTRATE 25 MG PO TABS
25.0000 mg | ORAL_TABLET | Freq: Once | ORAL | Status: AC
  Administered 2023-12-07 (×2): 25 mg via ORAL
  Filled 2023-12-07: qty 1

## 2023-12-07 MED ORDER — SPIRONOLACTONE 12.5 MG HALF TABLET
12.5000 mg | ORAL_TABLET | Freq: Once | ORAL | Status: AC
  Administered 2023-12-07 (×2): 12.5 mg via ORAL
  Filled 2023-12-07: qty 1

## 2023-12-07 MED ORDER — IOHEXOL 350 MG/ML SOLN
75.0000 mL | Freq: Once | INTRAVENOUS | Status: AC | PRN
  Administered 2023-12-07 (×2): 75 mL via INTRAVENOUS

## 2023-12-07 NOTE — ED Notes (Signed)
 Patient transported to CT

## 2023-12-07 NOTE — ED Provider Notes (Signed)
 Care assumed at 2330.  Patient status post TAVR 1 month ago here for evaluation of difficulty breathing.  He was in A-fib with RVR, this did convert with his home medication.  Care assumed pending cardiology evaluation.  Dr. Almetta with cardiology has evaluated the patient in the emergency department, plan to discharge home with outpatient follow-up.  Will discontinue aspirin .   Griselda Norris, MD 12/08/23 [phone]

## 2023-12-07 NOTE — ED Triage Notes (Signed)
 PT BIB [facility] from home with a c/o of increasing SOB with exertion since a recent stay in the hospital

## 2023-12-07 NOTE — ED Provider Notes (Signed)
 [place] EMERGENCY DEPARTMENT AT [facility] Provider Note  CSN: [id number] Arrival date & time: 12/07/23 1507  Chief Complaint(s) Shortness of Breath  HPI Luke Harvey is a 78 y.o. male with past medical history as below, significant for aortic stenosis, cardiomyopathy, CAD status post CABG 2014, TAVR 11/04/23 Dr Wonda, DVT, HTN, HLD who presents to the ED with complaint of dyspnea  Initial assessment patient reports he does not actually have any complaints.  EMS was called by home health nurse who was worried that he was very sleepy.  Patient reports that he stayed up all night watching a soap opera and slept much later than normal.  Reports he has exertional dyspnea that is essentially unchanged from his baseline.  Has greatly improved since he had TAVR last month.  He reports when he ambulates he oftentimes has to stop and rest after few steps to catch his breath but this is grossly unchanged over the past month since his TAVR and again is much better than how he was feeling prior to his TAVR.  Reports he has not taken his medications yet today because he had not yet eaten, otherwise is compliant with his medications.  No fevers, chills, nausea, abdominal pain chest pain, no leg swelling, no change in bowel or bladder function, no recent falls or head injuries  Past Medical History Past Medical History:  Diagnosis Date   Age-related macular degeneration, dry, both eyes    Anxiety    Aortic stenosis    Arthritis    probably all over (06/08/2016)   Cardiomyopathy (HCC)    a. EF 40-45% in 2018, normalized in 03/2020.   Chronic back pain    started in my lower back; going up my back in the last couple months (06/08/2016)   Chronic sinusitis    Coronary artery disease    a. s/p CABGx 2V (2014 in [place])  b. PCI (2015 in [place]). c. PCI 2018 ([facility]). d. PCI 03/2020 (complex - [facility]).   Diabetic peripheral neuropathy (HCC)    DVT (deep venous thrombosis) (HCC)     left groin; it was there when I had bypass OR; get it checked q yr; still there now (06/08/2016)   GERD (gastroesophageal reflux disease)    Hepatitis B    History of bleeding ulcers    History of diverticulitis    History of hiatal hernia    HLD (hyperlipidemia)    HTN (hypertension)    LV dysfunction, post MI EF 40-45%. 06/09/2016   NSTEMI (non-ST elevated myocardial infarction) (HCC) 06/08/2016   Pneumonia    3-4 times maybe (06/08/2016)   PVD (peripheral vascular disease) (HCC)    Recovering alcoholic (HCC)    picked up my 30 year chip the other day (06/08/2016)   S/P angioplasty with stent, 06/08/16 DES, for in-stent restenosis in RCA. 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 Wonda and Dr. Shyrl.   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 06/09/2016   NSTEMI (non-ST elevated myocardial infarction) (HCC) 06/08/2016   PVD (peripheral vascular disease) (HCC)    Diabetes mellitus (HCC)    Coronary artery disease with hx CABG    HLD (hyperlipidemia)    Home  Medication(s) Prior to Admission medications   Medication Sig Start Date End Date Taking? Authorizing Provider  acetaminophen  (TYLENOL ) 500 MG tablet Take 500 mg by mouth 2 (two) times daily.    [provider]  apixaban  (ELIQUIS ) 2.5 MG TABS tablet Take 1 tablet (2.5 mg total) by mouth 2 (two) times daily. 11/08/23   Henry Manuelita NOVAK, NP  aspirin  EC 81 MG tablet Take 1 tablet (81 mg total) by mouth daily at 12 noon. 11/08/23   Henry Manuelita B, NP  BIOTIN PO Take 1 tablet by mouth daily at 12 noon.    [provider]  carboxymethylcellulose (REFRESH PLUS) 0.5 % SOLN Place 1 drop into both eyes See admin instructions. Administer 1 drop into each eye every daily. May use every 12 hours if needed for dry eyes.    [provider]  Cholecalciferol (VITAMIN D-3 PO) Take 1 tablet by mouth daily at 12 noon.    [provider]  clopidogrel  (PLAVIX ) 75 MG tablet Take 1 tablet (75 mg total) by mouth daily. 11/09/23   Henry Manuelita NOVAK, NP  Coenzyme Q10 (COQ10 PO) Take 1 capsule by mouth daily at 12 noon.    [provider]  Cyanocobalamin (VITAMIN B12 PO) Take 1 tablet by mouth daily at 12 noon.    [provider]  diclofenac Sodium (VOLTAREN) 1 % GEL 1 Application every 6 (six) hours as needed for pain. 02/02/22   [provider]  DULoxetine  (CYMBALTA ) 30 MG capsule Take 30 mg by mouth at bedtime.    [provider]  empagliflozin  (JARDIANCE ) 10 MG TABS tablet Take 1 tablet (10 mg total) by mouth daily. 10/27/23   Amin, Ankit C, MD  fluticasone  (FLONASE ) 50 MCG/ACT nasal spray Place 1-2 sprays into both nostrils daily as needed for allergies or rhinitis.    [provider]  ketoconazole (NIZORAL) 2 % cream Apply 1 Application topically 2 (two) times daily as needed for irritation. 02/23/21   [provider]  ketoconazole (NIZORAL) 2 % shampoo Apply 1 Application topically every 3 (three) days.    [provider]  levOCARNitine (L-CARNITINE PO) Take 2 tablets by mouth daily at 12 noon.    [provider]  levothyroxine  (SYNTHROID ) 75 MCG tablet Take 75 mcg by mouth daily before breakfast. 02/02/22   [provider]  lidocaine  (LIDODERM ) 5 % Place 1 patch onto the skin daily as needed (pain).    [provider]  loratadine  (CLARITIN ) 10 MG tablet Take 10 mg by mouth daily at 12 noon.    [provider]  MAGNESIUM  PO Take 2 tablets by mouth daily at 12 noon.    [provider]  metFORMIN  (GLUCOPHAGE ) 500 MG tablet Take 1,000 mg by mouth 2 (two) times daily as needed (BS > 140). 05/04/06   [provider]  metoprolol  succinate (TOPROL -XL)  25 MG 24 hr tablet Take 1 tablet (25 mg total) by mouth at bedtime. 11/08/23   Henry Shaver B, NP  MILK THISTLE PO Take 1 tablet by mouth daily at 12 noon.    [provider]  Multiple Vitamins-Minerals (PRESERVISION AREDS 2) CAPS Take 1 capsule by mouth 2 (two) times daily.    [provider]  nitroGLYCERIN  (NITROSTAT ) 0.4 MG SL tablet Place 1 tablet (0.4 mg total) under the tongue See admin instructions. Take 1 tablet (0.4) mg by mouth every day. May take an additional 1 tablet later in the day if  chest pain. 10/26/23   Amin, Ankit C, MD  OVER THE COUNTER MEDICATION Take 1 capsule by mouth daily at 12 noon. VisiUltra    [provider]  OVER THE COUNTER MEDICATION Take 1 capsule by mouth daily at 12 noon. blood boost supplement for glucose control    [provider]  Probiotic Product (PROBIOTIC PO) Take 2 capsules by mouth daily at 12 noon.    [provider]  QUERCETIN PO Take 1 tablet by mouth daily at 12 noon.    [provider]  rosuvastatin  (CRESTOR ) 40 MG tablet Take 1 tablet (40 mg total) by mouth daily. Patient taking differently: Take 40 mg by mouth at bedtime. 04/05/20   Dunn, Dayna N, PA-C  SELENIUM PO Take 1 tablet by mouth daily at 12 noon.     [provider]  spironolactone  (ALDACTONE ) 25 MG tablet Take 0.5 tablets (12.5 mg total) by mouth daily. 11/08/23   Henry Shaver NOVAK, NP                                                                                                                                    Past Surgical History Past Surgical History:  Procedure Laterality Date   ANTERIOR CERVICAL DECOMP/DISCECTOMY FUSION     BACK SURGERY     CARDIAC CATHETERIZATION  03/2012   led to bypass   CAROTID ENDARTERECTOMY Right ~ 2014   COLONOSCOPY W/ BIOPSIES AND POLYPECTOMY     CORONARY ANGIOPLASTY WITH STENT PLACEMENT  2015; 06/08/2016   CORONARY ARTERY BYPASS GRAFT  04/10/2012   CORONARY ATHERECTOMY N/A 04/04/2020   Procedure: CORONARY ATHERECTOMY;  Surgeon: Burnard Debby LABOR, MD;  Location: [facility] INVASIVE CV LAB;  Service: Cardiovascular;  Laterality: N/A;   CORONARY STENT INTERVENTION N/A 06/08/2016   Procedure: Coronary Stent Intervention;  Surgeon: Debby LABOR Burnard, MD;  Location: [facility] INVASIVE CV LAB;  Service: Cardiovascular;  Laterality: N/A;   CORONARY STENT INTERVENTION N/A 04/02/2020   Procedure: CORONARY STENT INTERVENTION;  Surgeon: Anner Alm ORN, MD;  Location: [facility] INVASIVE CV LAB;  Service: Cardiovascular;  Laterality: N/A;   CORONARY STENT INTERVENTION N/A 04/04/2020   Procedure: CORONARY STENT INTERVENTION;  Surgeon: Burnard Debby LABOR, MD;  Location: [facility] INVASIVE CV LAB;  Service: Cardiovascular;  Laterality: N/A;   CORONARY STENT INTERVENTION N/A 11/16/2021  Procedure: CORONARY STENT INTERVENTION;  Surgeon: Mady Bruckner, MD;  Location: [facility] INVASIVE CV LAB;  Service: Cardiovascular;  Laterality: N/A;   CORONARY STENT INTERVENTION N/A 10/24/2023   Procedure: CORONARY STENT INTERVENTION;  Surgeon: Anner Alm ORN, MD;  Location: [facility] INVASIVE CV LAB;  Service: Cardiovascular;  Laterality: N/A;   CORONARY ULTRASOUND/IVUS N/A 04/04/2020   Procedure: Intravascular Ultrasound/IVUS;  Surgeon: Burnard Debby LABOR, MD;   Location: [facility] INVASIVE CV LAB;  Service: Cardiovascular;  Laterality: N/A;   INTRAOPERATIVE TRANSTHORACIC ECHOCARDIOGRAM N/A 11/04/2023   Procedure: ECHOCARDIOGRAM, TRANSTHORACIC;  Surgeon: Wonda Sharper, MD;  Location: [facility] INVASIVE CV LAB;  Service: Cardiovascular;  Laterality: N/A;   IR THORACENTESIS ASP PLEURAL SPACE W/IMG GUIDE  11/02/2023   JOINT REPLACEMENT     LEFT HEART CATH AND CORONARY ANGIOGRAPHY N/A 06/08/2016   Procedure: Left Heart Cath and Coronary Angiography;  Surgeon: Debby LABOR Burnard, MD;  Location: [facility] INVASIVE CV LAB;  Service: Cardiovascular;  Laterality: N/A;   LEFT HEART CATH AND CORS/GRAFTS ANGIOGRAPHY N/A 04/02/2020   Procedure: LEFT HEART CATH AND CORS/GRAFTS ANGIOGRAPHY;  Surgeon: Anner Alm ORN, MD;  Location: [facility] INVASIVE CV LAB;  Service: Cardiovascular;  Laterality: N/A;   LEFT HEART CATH AND CORS/GRAFTS ANGIOGRAPHY N/A 11/16/2021   Procedure: LEFT HEART CATH AND CORS/GRAFTS ANGIOGRAPHY;  Surgeon: Mady Bruckner, MD;  Location: [facility] INVASIVE CV LAB;  Service: Cardiovascular;  Laterality: N/A;   REVISION TOTAL HIP ARTHROPLASTY Left ~ 2004   RIGHT/LEFT HEART CATH AND CORONARY ANGIOGRAPHY N/A 10/24/2023   Procedure: RIGHT/LEFT HEART CATH AND CORONARY ANGIOGRAPHY;  Surgeon: Anner Alm ORN, MD;  Location: [facility] INVASIVE CV LAB;  Service: Cardiovascular;  Laterality: N/A;   TEMPORARY PACEMAKER N/A 04/04/2020   Procedure: TEMPORARY PACEMAKER;  Surgeon: Burnard Debby LABOR, MD;  Location: [facility] INVASIVE CV LAB;  Service: Cardiovascular;  Laterality: N/A;   TONSILLECTOMY     TOTAL HIP ARTHROPLASTY Left 1990s   Family History Family History  Problem Relation Age of Onset   Cancer Mother     Social History Social History   Tobacco Use   Smoking status: Former    Current packs/day: 0.00    Average packs/day: 1 pack/day for 40.0 years (40.0 ttl pk-yrs)    Types: Cigarettes    Start date: 07/17/1968    Quit date: 07/17/2008    Years since quitting: 15.4   Smokeless tobacco: Never   Substance Use Topics   Alcohol  use: No    Comment: recovering alcoholic, abstinence 30 years.  attends AA weekly   Drug use: No   Allergies Tetracyclines & related, Cozaar [losartan], Zestril  [lisinopril ], and Lipitor [atorvastatin]  Review of Systems A thorough review of systems was obtained and all systems are negative except as noted in the HPI and PMH.   Physical Exam Vital Signs  I have reviewed the triage vital signs There were no vitals taken for this visit. Physical Exam Vitals and nursing note reviewed.  Constitutional:      General: He is not in acute distress.    Appearance: He is well-developed.  HENT:     Head: Normocephalic and atraumatic.     Right Ear: External ear normal.     Left Ear: External ear normal.     Mouth/Throat:     Mouth: Mucous membranes are moist.  Eyes:     General: No scleral icterus. Cardiovascular:     Rate and Rhythm: Tachycardia present. Rhythm irregular.     Pulses: Normal pulses.     Heart sounds: Normal heart  sounds.  Pulmonary:     Effort: Pulmonary effort is normal. No respiratory distress.     Breath sounds: Decreased breath sounds present.  Abdominal:     General: Abdomen is flat.     Palpations: Abdomen is soft.     Tenderness: There is no abdominal tenderness.  Musculoskeletal:     Cervical back: No rigidity.     Right lower leg: No edema.     Left lower leg: No edema.  Skin:    General: Skin is warm and dry.     Capillary Refill: Capillary refill takes less than 2 seconds.  Neurological:     Mental Status: He is alert.  Psychiatric:        Mood and Affect: Mood normal.        Behavior: Behavior normal.     ED Results and Treatments Labs (all labs ordered are listed, but only abnormal results are displayed) Labs Reviewed - No data to display                                                                                                                        Radiology No results found.  Pertinent labs &  imaging results that were available during my care of the patient were reviewed by me and considered in my medical decision making (see MDM for details).  Medications Ordered in ED Medications - No data to display                                                                                                                                   Procedures Procedures  (including critical care time)  Medical Decision Making / ED Course    Medical Decision Making:    Luke Harvey is a 77 y.o. male with past medical history as below, significant for aortic stenosis, cardiomyopathy, CAD status post CABG 2014, TAVR 11/04/23 Dr Wonda, DVT, HTN, HLD who presents to the ED with complaint of dyspnea The complaint involves an extensive differential diagnosis and also carries with it a high risk of complications and morbidity.  Serious etiology was considered. Ddx includes but is not limited to: In my evaluation of this patient's dyspnea my DDx includes, but is not limited to, pneumonia, pulmonary embolism, pneumothorax, pulmonary edema, metabolic acidosis, asthma, COPD, cardiac cause, anemia, anxiety, etc.    Complete initial physical exam performed, notably the patient was in no  acute distress, A-fib noted on telemetry.    Reviewed and confirmed nursing documentation for past medical history, family history, social history.  Vital signs reviewed.    Exertional dyspnea > - Improved since TAVR but still present - He has no chest pain, he has some slight troponin leak 28 > 41 - He has no acute complaints currently, he feels like his exertional dyspnea continues to improve from his TAVR, he has no chest pain.  No palpitations. - CTA with marked coronary artery calcification, no PE, small b/l pleural eff noted  Atrial fibrillation > - Has not taken his medications yet today.  Rate between 0100-0120 on telemetry.  No palpitations or chest pain - Given his metoprolol , he has converted back in sinus  rhythm. - slight trop leak, likely demand ischemia in setting of afib     Cardiology to see, can likely discharge if pt cleared by cardiology, handoff incoming EDP at this time                 Additional history obtained: -Additional history obtained from na -External records from outside source obtained and reviewed including: Chart review including previous notes, labs, imaging, consultation notes including  Cardiology documentation, prior labs, home meds   Lab Tests: -I ordered, reviewed, and interpreted labs.   The pertinent results include:   Labs Reviewed - No data to display  Notable for bnp elev, trop +  EKG   EKG Interpretation Date/Time:    Ventricular Rate:    PR Interval:    QRS Duration:    QT Interval:    QTC Calculation:   R Axis:      Text Interpretation:           Imaging Studies ordered: I ordered imaging studies including CXR, CTPE I independently visualized the following imaging with scope of interpretation limited to determining acute life threatening conditions related to emergency care; findings noted above I agree with the radiologist interpretation If any imaging was obtained with contrast I closely monitored patient for any possible adverse reaction a/w contrast administration in the emergency department   Medicines ordered and prescription drug management: No orders of the defined types were placed in this encounter.   -I have reviewed the patients home medicines and have made adjustments as needed   Consultations Obtained: I requested consultation with the cardiology,  and discussed lab and imaging findings as well as pertinent plan - they recommend: will see in consult   Cardiac Monitoring: The patient was maintained on a cardiac monitor.  I personally viewed and interpreted the cardiac monitored which showed an underlying rhythm of: afib rvr > nsr Continuous pulse oximetry interpreted by myself, 100% on ra.     Social Determinants of Health:  Diagnosis or treatment significantly limited by social determinants of health: former smoker   Reevaluation: After the interventions noted above, I reevaluated the patient and found that they have improved  Co morbidities that complicate the patient evaluation  Past Medical History:  Diagnosis Date   Age-related macular degeneration, dry, both eyes    Anxiety    Aortic stenosis    Arthritis    probably all over (06/08/2016)   Cardiomyopathy (HCC)    a. EF 40-45% in 2018, normalized in 03/2020.   Chronic back pain    started in my lower back; going up my back in the last couple months (06/08/2016)   Chronic sinusitis    Coronary artery disease    a. s/p CABGx  2V (2014 in [place])  b. PCI (2015 in [place]). c. PCI 2018 ([facility]). d. PCI 03/2020 (complex - [facility]).   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 06/09/2016   NSTEMI (non-ST elevated myocardial infarction) (HCC) 06/08/2016   Pneumonia    3-4 times maybe (06/08/2016)   PVD (peripheral vascular disease) (HCC)    Recovering alcoholic (HCC)    picked up my 30 year chip the other day (06/08/2016)   S/P angioplasty with stent, 06/08/16 DES, for in-stent restenosis in RCA. 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 Wonda and Dr. Shyrl.   Sleep apnea    got a mask after OR; don't use it (06/08/2016)   Type II diabetes mellitus (HCC)       Dispostion: Disposition decision including need for hospitalization was considered, and  patient disposition pending at time of sign out.    Final Clinical Impression(s) / ED Diagnoses Final diagnoses:  Atrial fibrillation with RVR (HCC)  Exertional dyspnea        Elnor Jayson LABOR, DO 12/10/23 0720

## 2023-12-07 NOTE — ED Notes (Signed)
 Called and placed PT on monitor with CCMD

## 2023-12-08 DIAGNOSIS — I480 Paroxysmal atrial fibrillation: Secondary | ICD-10-CM

## 2023-12-08 NOTE — Discharge Instructions (Addendum)
 It was a pleasure caring for you today in the emergency department.  Please follow up with your cardiologist   Please return to the emergency department for any worsening or worrisome symptoms.  Please stop taking your aspirin .

## 2023-12-08 NOTE — Consult Note (Signed)
 Cardiology Consultation:   Patient ID: Luke Harvey MRN: [id number]; DOB: [birth year]  Admit date: 12/07/2023 Date of Consult: 12/08/2023  Primary Care Provider: [facility] Cardiologist: Lonni LITTIE Nanas, MD  [facility] Electrophysiologist:  None   Patient Profile:   Luke Harvey is a 78 y.o. male with a hx of severe AS s/p TAVR, paroxysmal AF, DM2, NSTEMI, HLD, CKD, SVT, and HFrEF who is being seen today for the evaluation of AF/RVR at the request of Dr. Elnor.  History of Present Illness:   Mr. Gappa presented to the emergency department at the request of one of his home health care nurses who said that he was more fatigued and less responsive than normal.  He still lives at home by himself but has home health care visits.  His daughter lives about 20 minutes away and frequently checks on him.  He was discharged last month following uncomplicated TAVR.  Since that time he is noted improvement in his breathing and much less dyspnea on exertion.  He said the reason that he was more fatigued was he stayed up the whole night before watching a series of movies and slept in late.  He denies any symptoms during my evaluation. Initially he was in AF/RVR in the 120s. He spontaneously converted to NSR. He had missed some of his 08/13 AM meds while sleeping in.   VS: BP 115/73 (85), NSR 80, O2 100%/RA.   Labs unremarkable other than a mild hsT leak (28->41) in the setting of AF/RVR. He denied any CP.   Past Medical History:  Diagnosis Date   Age-related macular degeneration, dry, both eyes    Anxiety    Aortic stenosis    Arthritis    probably all over (06/08/2016)   Cardiomyopathy (HCC)    a. EF 40-45% in 2018, normalized in 03/2020.   Chronic back pain    started in my lower back; going up my back in the last couple months (06/08/2016)   Chronic sinusitis    Coronary artery disease    a. s/p CABGx 2V (2014 in [place])  b. PCI (2015 in  [place]). c. PCI 2018 ([facility]). d. PCI 03/2020 (complex - [facility]).   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 06/09/2016   NSTEMI (non-ST elevated myocardial infarction) (HCC) 06/08/2016   Pneumonia    3-4 times maybe (06/08/2016)   PVD (peripheral vascular disease) (HCC)    Recovering alcoholic (HCC)    picked up my 30 year chip the other day (06/08/2016)   S/P angioplasty with stent, 06/08/16 DES, for in-stent restenosis in RCA. 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 Wonda and Dr. Shyrl.   Sleep apnea    got a mask after OR; don't use it (06/08/2016)   Type II diabetes mellitus (HCC)    Past Surgical History:  Procedure Laterality Date   ANTERIOR CERVICAL DECOMP/DISCECTOMY FUSION     BACK SURGERY  CARDIAC CATHETERIZATION  03/2012   led to bypass   CAROTID ENDARTERECTOMY Right ~ 2014   COLONOSCOPY W/ BIOPSIES AND POLYPECTOMY     CORONARY ANGIOPLASTY WITH STENT PLACEMENT  2015; 06/08/2016   CORONARY ARTERY BYPASS GRAFT  04/10/2012   CORONARY ATHERECTOMY N/A 04/04/2020   Procedure: CORONARY ATHERECTOMY;  Surgeon: Burnard Debby LABOR, MD;  Location: [facility] INVASIVE CV LAB;  Service: Cardiovascular;  Laterality: N/A;   CORONARY STENT INTERVENTION N/A 06/08/2016   Procedure: Coronary Stent Intervention;  Surgeon: Debby LABOR Burnard, MD;  Location: [facility] INVASIVE CV LAB;  Service: Cardiovascular;  Laterality: N/A;   CORONARY STENT INTERVENTION N/A 04/02/2020   Procedure: CORONARY STENT INTERVENTION;  Surgeon: Anner Alm ORN, MD;   Location: [facility] INVASIVE CV LAB;  Service: Cardiovascular;  Laterality: N/A;   CORONARY STENT INTERVENTION N/A 04/04/2020   Procedure: CORONARY STENT INTERVENTION;  Surgeon: Burnard Debby LABOR, MD;  Location: [facility] INVASIVE CV LAB;  Service: Cardiovascular;  Laterality: N/A;   CORONARY STENT INTERVENTION N/A 11/16/2021   Procedure: CORONARY STENT INTERVENTION;  Surgeon: Mady Bruckner, MD;  Location: [facility] INVASIVE CV LAB;  Service: Cardiovascular;  Laterality: N/A;   CORONARY STENT INTERVENTION N/A 10/24/2023   Procedure: CORONARY STENT INTERVENTION;  Surgeon: Anner Alm ORN, MD;  Location: [facility] INVASIVE CV LAB;  Service: Cardiovascular;  Laterality: N/A;   CORONARY ULTRASOUND/IVUS N/A 04/04/2020   Procedure: Intravascular Ultrasound/IVUS;  Surgeon: Burnard Debby LABOR, MD;  Location: [facility] INVASIVE CV LAB;  Service: Cardiovascular;  Laterality: N/A;   INTRAOPERATIVE TRANSTHORACIC ECHOCARDIOGRAM N/A 11/04/2023   Procedure: ECHOCARDIOGRAM, TRANSTHORACIC;  Surgeon: Wonda Sharper, MD;  Location: [facility] INVASIVE CV LAB;  Service: Cardiovascular;  Laterality: N/A;   IR THORACENTESIS ASP PLEURAL SPACE W/IMG GUIDE  11/02/2023   JOINT REPLACEMENT     LEFT HEART CATH AND CORONARY ANGIOGRAPHY N/A 06/08/2016   Procedure: Left Heart Cath and Coronary Angiography;  Surgeon: Debby LABOR Burnard, MD;  Location: [facility] INVASIVE CV LAB;  Service: Cardiovascular;  Laterality: N/A;   LEFT HEART CATH AND CORS/GRAFTS ANGIOGRAPHY N/A 04/02/2020   Procedure: LEFT HEART CATH AND CORS/GRAFTS ANGIOGRAPHY;  Surgeon: Anner Alm ORN, MD;  Location: [facility] INVASIVE CV LAB;  Service: Cardiovascular;  Laterality: N/A;   LEFT HEART CATH AND CORS/GRAFTS ANGIOGRAPHY N/A 11/16/2021   Procedure: LEFT HEART CATH AND CORS/GRAFTS ANGIOGRAPHY;  Surgeon: Mady Bruckner, MD;  Location: [facility] INVASIVE CV LAB;  Service: Cardiovascular;  Laterality: N/A;   REVISION TOTAL HIP ARTHROPLASTY Left ~ 2004   RIGHT/LEFT HEART CATH AND CORONARY ANGIOGRAPHY N/A 10/24/2023   Procedure:  RIGHT/LEFT HEART CATH AND CORONARY ANGIOGRAPHY;  Surgeon: Anner Alm ORN, MD;  Location: [facility] INVASIVE CV LAB;  Service: Cardiovascular;  Laterality: N/A;   TEMPORARY PACEMAKER N/A 04/04/2020   Procedure: TEMPORARY PACEMAKER;  Surgeon: Burnard Debby LABOR, MD;  Location: [facility] INVASIVE CV LAB;  Service: Cardiovascular;  Laterality: N/A;   TONSILLECTOMY     TOTAL HIP ARTHROPLASTY Left 1990s    Home Medications:  Prior to Admission medications   Medication Sig Start Date End Date Taking? Authorizing Provider  acetaminophen  (TYLENOL ) 500 MG tablet Take 500 mg by mouth 2 (two) times daily.    [provider]  apixaban  (ELIQUIS ) 2.5 MG TABS tablet Take 1 tablet (2.5 mg total) by mouth 2 (two) times daily. 11/08/23   Henry Shaver B, NP  BIOTIN PO Take 1 tablet by mouth daily at 12 noon.    [provider]  carboxymethylcellulose (REFRESH PLUS) 0.5 % SOLN Place 1 drop into both eyes See  admin instructions. Administer 1 drop into each eye every daily. May use every 12 hours if needed for dry eyes.    [provider]  Cholecalciferol (VITAMIN D-3 PO) Take 1 tablet by mouth daily at 12 noon.    [provider]  clopidogrel  (PLAVIX ) 75 MG tablet Take 1 tablet (75 mg total) by mouth daily. 11/09/23   Henry Manuelita NOVAK, NP  Coenzyme Q10 (COQ10 PO) Take 1 capsule by mouth daily at 12 noon.    [provider]  Cyanocobalamin (VITAMIN B12 PO) Take 1 tablet by mouth daily at 12 noon.    [provider]  diclofenac Sodium (VOLTAREN) 1 % GEL 1 Application every 6 (six) hours as needed for pain. 02/02/22   [provider]  DULoxetine  (CYMBALTA ) 30 MG capsule Take 30 mg by mouth at bedtime.    [provider]  empagliflozin  (JARDIANCE ) 10 MG TABS tablet Take 1 tablet (10 mg total) by mouth daily. 10/27/23   Amin, Ankit C, MD  fluticasone  (FLONASE ) 50 MCG/ACT nasal spray Place 1-2 sprays into both nostrils daily as needed for allergies or rhinitis.     [provider]  ketoconazole (NIZORAL) 2 % cream Apply 1 Application topically 2 (two) times daily as needed for irritation. 02/23/21   [provider]  ketoconazole (NIZORAL) 2 % shampoo Apply 1 Application topically every 3 (three) days.    [provider]  levOCARNitine (L-CARNITINE PO) Take 2 tablets by mouth daily at 12 noon.    [provider]  levothyroxine  (SYNTHROID ) 75 MCG tablet Take 75 mcg by mouth daily before breakfast. 02/02/22   [provider]  lidocaine  (LIDODERM ) 5 % Place 1 patch onto the skin daily as needed (pain).    [provider]  loratadine  (CLARITIN ) 10 MG tablet Take 10 mg by mouth daily at 12 noon.    [provider]  MAGNESIUM  PO Take 2 tablets by mouth daily at 12 noon.    [provider]  metFORMIN  (GLUCOPHAGE ) 500 MG tablet Take 1,000 mg by mouth 2 (two) times daily as needed (BS > 140). 05/04/06   [provider]  metoprolol  succinate (TOPROL -XL) 25 MG 24 hr tablet Take 1 tablet (25 mg total) by mouth at bedtime. 11/08/23   Henry Manuelita B, NP  MILK THISTLE PO Take 1 tablet by mouth daily at 12 noon.    [provider]  Multiple Vitamins-Minerals (PRESERVISION AREDS 2) CAPS Take 1 capsule by mouth 2 (two) times daily.    [provider]  nitroGLYCERIN  (NITROSTAT ) 0.4 MG SL tablet Place 1 tablet (0.4 mg total) under the tongue See admin instructions. Take 1 tablet (0.4) mg by mouth every day. May take an additional 1 tablet later in the day if  chest pain. 10/26/23   Amin, Ankit C, MD  OVER THE COUNTER MEDICATION Take 1 capsule by mouth daily at 12 noon. VisiUltra    [provider]  OVER THE COUNTER MEDICATION Take 1 capsule by mouth daily at 12 noon. blood boost supplement for glucose control    [provider]  Probiotic Product (PROBIOTIC PO) Take 2 capsules by mouth daily at 12 noon.    [provider]  QUERCETIN PO Take 1 tablet by  mouth daily at 12 noon.    [provider]  rosuvastatin  (CRESTOR ) 40 MG tablet Take 1 tablet (40 mg total) by mouth daily. Patient taking differently: Take 40 mg by mouth at bedtime. 04/05/20   Dunn,  Dayna N, PA-C  SELENIUM PO Take 1 tablet by mouth daily at 12 noon.    [provider]  spironolactone  (ALDACTONE ) 25 MG tablet Take 0.5 tablets (12.5 mg total) by mouth daily. 11/08/23   Henry Manuelita NOVAK, NP   Inpatient Medications: Scheduled Meds:  Continuous Infusions:  PRN Meds:  Allergies:    Allergies  Allergen Reactions   Tetracyclines & Related Hives and Itching   Cozaar [Losartan] Cough   Zestril  [Lisinopril ] Cough   Lipitor [Atorvastatin] Other (See Comments)    Myalgias    Social History:   Social History   Socioeconomic History   Marital status: Divorced    Spouse name: Not on file   Number of children: 2   Years of education: Not on file   Highest education level: GED or equivalent  Occupational History   Occupation: Retired  Tobacco Use   Smoking status: Former    Current packs/day: 0.00    Average packs/day: 1 pack/day for 40.0 years (40.0 ttl pk-yrs)    Types: Cigarettes    Start date: 07/17/1968    Quit date: 07/17/2008    Years since quitting: 15.4   Smokeless tobacco: Never  Substance and Sexual Activity   Alcohol  use: No    Comment: recovering alcoholic, abstinence 30 years.  attends AA weekly   Drug use: No   Sexual activity: Never  Other Topics Concern   Not on file  Social History Narrative   Not on file   Social Drivers of Health   Financial Resource Strain: Not on file  Food Insecurity: No Food Insecurity (10/29/2023)   Hunger Vital Sign    Worried About Running Out of Food in the Last Year: Never true    Ran Out of Food in the Last Year: Never true  Transportation Needs: No Transportation Needs (10/29/2023)   PRAPARE - Administrator, Civil Service (Medical): No    Lack of Transportation (Non-Medical): No   Physical Activity: Not on file  Stress: Not on file  Social Connections: Unknown (10/30/2023)   Social Connection and Isolation Panel    Frequency of Communication with Friends and Family: Never    Frequency of Social Gatherings with Friends and Family: Never    Attends Religious Services: Never    Database administrator or Organizations: Patient declined    Attends Banker Meetings: Patient declined    Marital Status: Not on file  Intimate Partner Violence: Not At Risk (10/29/2023)   Humiliation, Afraid, Rape, and Kick questionnaire    Fear of Current or Ex-Partner: No    Emotionally Abused: No    Physically Abused: No    Sexually Abused: No    Family History:    Family History  Problem Relation Age of Onset   Cancer Mother    ROS:  Review of Systems: [y] = yes, [ ]  = no      General: Weight gain [ ] ; Weight loss [ ] ; Anorexia [ ] ; Fatigue [ ] ; Fever [ ] ; Chills [ ] ; Weakness [ ]    Cardiac: Chest pain/pressure [ ] ; Resting SOB [ ] ; Exertional SOB [ ] ; Orthopnea [ ] ; Pedal Edema [ ] ; Palpitations [ ] ; Syncope [ ] ; Presyncope [ ] ; Paroxysmal nocturnal dyspnea [ ]    Pulmonary: Cough [ ] ; Wheezing [ ] ; Hemoptysis [ ] ; Sputum [ ] ; Snoring [ ]    GI: Vomiting [ ] ; Dysphagia [ ] ; Melena [ ] ; Hematochezia [ ] ; Heartburn [ ] ; Abdominal  pain [ ] ; Constipation [ ] ; Diarrhea [ ] ; BRBPR [ ]    GU: Hematuria [ ] ; Dysuria [ ] ; Nocturia [ ]  Vascular: Pain in legs with walking [ ] ; Pain in feet with lying flat [ ] ; Non-healing sores [ ] ; Stroke [ ] ; TIA [ ] ; Slurred speech [ ] ;   Neuro: Headaches [ ] ; Vertigo [ ] ; Seizures [ ] ; Paresthesias [ ] ;Blurred vision [ ] ; Diplopia [ ] ; Vision changes [ ]    Ortho/Skin: Arthritis [ ] ; Joint pain [ ] ; Muscle pain [ ] ; Joint swelling [ ] ; Back Pain [ ] ; Rash [ ]    Psych: Depression [ ] ; Anxiety [ ]    Heme: Bleeding problems [ ] ; Clotting disorders [ ] ; Anemia [ ]    Endocrine: Diabetes [ ] ; Thyroid dysfunction [ ]    Physical Exam/Data:   Vitals:    12/07/23 2000 12/07/23 2015 12/07/23 2300 12/07/23 2309  BP: (!) 117/93 127/72 104/67   Pulse: 85 81 76   Resp: 17 20 (!) 23   Temp:    98.7 F (37.1 C)  TempSrc:    Oral  SpO2: 100% 100% 98%   Weight:      Height:       No intake or output data in the 24 hours ending 12/08/23 0003    12/07/2023    3:29 PM 11/08/2023    4:30 AM 11/07/2023    5:54 AM  Last 3 Weights  Weight (lbs) 133 lb 9.6 oz 133 lb 9.6 oz 134 lb  Weight (kg) 60.6 kg 60.6 kg 60.782 kg     Body mass index is 21.24 kg/m.  General:  Well nourished, well developed, in no acute distress HEENT: normal Neck: no JVD Vascular: No carotid bruits; FA pulses 2+ bilaterally without bruits  Cardiac:  normal S1, S2; RRR; no murmur  Lungs:  clear to auscultation bilaterally, no wheezing, rhonchi or rales  Abd: soft, nontender, no hepatomegaly  Ext: no edema Musculoskeletal:  No deformities, BUE and BLE strength normal and equal Skin: warm and dry  Neuro:  CNs 2-12 intact, no focal abnormalities noted Psych:  Normal affect   EKG:  The EKGs were personally reviewed and demonstrate:  12/08/23 (02:07:33): NSR 75, PR 190, QRS 116, QT/c 406/454, no ischemic changes  12/07/23 (15:16:50): AF/RVR 120, QRS 112, QT/c 364/515, no ischemic changes  Telemetry: Telemetry was personally reviewed and demonstrates: AF/RVR 120s->NSR 80s  Relevant CV Studies:  TTE Result date: 11/05/23  1. Global hypokinesis worse in septum and apex No mural apical thrombus  with definity . Left ventricular ejection fraction, by estimation, is 20 to  25%. The left ventricle has severely decreased function. The left  ventricle has no regional wall motion  abnormalities. Left ventricular diastolic parameters are consistent with  Grade II diastolic dysfunction (pseudonormalization). Elevated left  ventricular end-diastolic pressure.   2. Right ventricular systolic function is normal. The right ventricular  size is normal.   3. Left atrial size was  severely dilated.   4. The mitral valve is abnormal. Mild mitral valve regurgitation. No  evidence of mitral stenosis.   5. Post TAVR with 23 mm Sapien 3 valve plus 2 cc inflation. No PVL mean  gradient 11 peak 19 mmHg [name] 1.4 cm2. The aortic valve has been  repaired/replaced. Aortic valve regurgitation is not visualized. No aortic  stenosis is present. There is a 23 mm  Edwards S3U TAVR valve present in the aortic position. Procedure Date:  11/04/2023.   6. The inferior  vena cava is normal in size with greater than 50%  respiratory variability, suggesting right atrial pressure of 3 mmHg.   Laboratory Data:  High Sensitivity Troponin:   Recent Labs  Lab 12/07/23 1530 12/07/23 1805  TROPONINIHS 28* 41*     Chemistry Recent Labs  Lab 12/07/23 1530  NA 138  K 4.1  CL 108  CO2 20*  GLUCOSE 95  BUN 16  CREATININE 1.03  CALCIUM  9.1  GFRNONAA >60  ANIONGAP 10    Recent Labs  Lab 12/07/23 1530  PROT 6.5  ALBUMIN 3.3*  AST 24  ALT 10  ALKPHOS 73  BILITOT 0.8   Hematology Recent Labs  Lab 12/07/23 1530  WBC 5.7  RBC 3.98*  HGB 11.1*  HCT 36.4*  MCV 91.5  MCH 27.9  MCHC 30.5  RDW 14.6  PLT 193   BNP Recent Labs  Lab 12/07/23 1530  BNP 559.7*    DDimer No results for input(s): DDIMER in the last 168 hours.  Radiology/Studies:  CT Angio Chest PE W and/or Wo Contrast Result Date: 12/07/2023 CLINICAL DATA:  Shortness of breath. EXAM: CT ANGIOGRAPHY CHEST WITH CONTRAST TECHNIQUE: Multidetector CT imaging of the chest was performed using the standard protocol during bolus administration of intravenous contrast. Multiplanar CT image reconstructions and MIPs were obtained to evaluate the vascular anatomy. RADIATION DOSE REDUCTION: This exam was performed according to the departmental dose-optimization program which includes automated exposure control, adjustment of the mA and/or kV according to patient size and/or use of iterative reconstruction technique.  CONTRAST:  75mL OMNIPAQUE  IOHEXOL  350 MG/ML SOLN COMPARISON:  November 02, 2023 FINDINGS: Cardiovascular: There is marked severity calcification of the thoracic aorta. An artificial aortic valve is seen. Satisfactory opacification of the pulmonary arteries to the segmental level. No evidence of pulmonary embolism. There is mild cardiomegaly with marked severity coronary artery calcification. A coronary artery stent is also noted. No pericardial effusion. Mediastinum/Nodes: Multiple subcentimeter AP window and pretracheal lymph nodes are seen. A 1.5 cm right hilar lymph node is also noted. Thyroid gland, trachea, and esophagus demonstrate no significant findings. Lungs/Pleura: Mild biapical and lingular scarring and/or atelectasis is seen. Moderate severity areas of scarring, atelectasis and fibrosis are seen throughout the right middle lobe and bilateral lower lobes. There are small bilateral pleural effusions. No pneumothorax is identified. Upper Abdomen: No acute abnormality. Musculoskeletal: Multiple sternal wires are seen. Multilevel degenerative changes are present throughout the thoracic spine. Review of the MIP images confirms the above findings. IMPRESSION: 1. No evidence of pulmonary embolism. 2. Moderate severity areas of scarring, atelectasis and fibrosis throughout the right middle lobe and bilateral lower lobes. 3. Small bilateral pleural effusions. 4. Mild cardiomegaly with marked severity coronary artery calcification. 5. Aortic atherosclerosis. Electronically Signed   By: Suzen Dials M.D.   On: 12/07/2023 21:57   DG Chest Port 1 View Result Date: 12/07/2023 CLINICAL DATA:  Shortness of breath with exertion. EXAM: PORTABLE CHEST 1 VIEW COMPARISON:  November 02, 2023 FINDINGS: Multiple sternal wires and vascular clips are seen. A coronary artery stent is in place. The heart size and mediastinal contours are within normal limits. Mild to moderate severity scarring, atelectasis and/or infiltrate is seen  within the bilateral lung bases. There is a small right pleural effusion. No pneumothorax is identified. The visualized skeletal structures are unremarkable. IMPRESSION: 1. Evidence of prior median sternotomy/CABG. 2. Mild to moderate severity bibasilar scarring, atelectasis and/or infiltrate. 3. Small right pleural effusion. Electronically Signed   By: Suzen  Houston M.D.   On: 12/07/2023 18:22   Assessment and Plan:  MAXTON NOREEN is a 78 y.o. male with a hx of severe AS s/p TAVR, paroxysmal AF, DM2, NSTEMI, HLD, CKD, SVT, and HFrEF who is being seen today for the evaluation of AF/RVR.   AF/RVR Paroxsymal AF HFrEF Mr. Head has known AF and is appropriately anticoagulated. He has limited rhythm awareness. He denies any sx but came to the ED because his home health care nurse was concerned about his fatigue. He attributes this to staying up all night and sleeping most of the morning before they came over. He has no complaints and denies CP. He did miss/delay his AM meds on 08/13 which he has now resumed. He self converted back to NSR and all of his evaluation is reassuring. The mild hsT leak is most likely from AF/RVR in the absence of any anginal sx. He is otherwise stable for discharge home. He has been taking triple tx since discharge last month. The recommendation was to stop ASA and continue plavix /apix after 2 weeks which I have asked him to do.   [facility] will sign off.   The patient is ready for discharge today from a cardiac standpoint. Medication Recommendations: stop ASA Other recommendations (labs, testing, etc):  none Follow up as an outpatient:  as scheduled   For questions or updates, please contact [facility] Please consult [email or web address] for contact info under   Signed, Donnice DELENA Primus, MD  12/08/2023 12:03 AM

## 2023-12-09 ENCOUNTER — Other Ambulatory Visit: Payer: Self-pay

## 2023-12-09 NOTE — Patient Instructions (Signed)
 Visit Information  Thank you for taking time to visit with me today. Please don't hesitate to contact me if I can be of assistance to you before our next scheduled appointment.  Your next care management appointment is no further scheduled appointments.    Please call the care guide team at [phone] if you need to cancel, schedule, or reschedule an appointment.   A reminder to ALL patients/family/friends, please call the [place]  National Suicide Prevention Lifeline: (509)371-7410 or TTY: 469-876-1951 TTY 737 618 6305) to talk to a trained counselor if you are experiencing a Mental Health or Behavioral Health Crisis or need someone to talk to.  Santana Stamp BSN, CCM [place]  VBCI Population Health RN Care Manager Direct Dial: [phone]  Fax: [phone]

## 2023-12-09 NOTE — Patient Outreach (Addendum)
 Complex Care Management   Visit Note  12/09/2023  Name:  Luke Harvey MRN: [id number] DOB: 1945/09/12  Situation: Referral received for Complex Care Management related to Resources for In-Home assist.  I obtained verbal consent from Caregiver.  Visit completed with [name] Bunker, daughter  on the phone. No concerns today, states patient is feeling much better, he started driving again, had appointment with [facility] 8/5/265 where he was to discuss in-home aide with SW, he declined aide at this time,stating he doesn't currently have a need at this time. They provided equipment: 3-in-one toilet, rollator, health watch.  Daughter declines medication reconciliation due to meds being at patient's home and she is at her home.    Background:   Past Medical History:  Diagnosis Date   Age-related macular degeneration, dry, both eyes    Anxiety    Aortic stenosis    Arthritis    probably all over (06/08/2016)   Cardiomyopathy (HCC)    a. EF 40-45% in 2018, normalized in 03/2020.   Chronic back pain    started in my lower back; going up my back in the last couple months (06/08/2016)   Chronic sinusitis    Coronary artery disease    a. s/p CABGx 2V (2014 in [place])  b. PCI (2015 in [place]). c. PCI 2018 ([facility]). d. PCI 03/2020 (complex - [facility]).   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 06/09/2016   NSTEMI (non-ST elevated myocardial infarction) (HCC) 06/08/2016   Pneumonia    3-4 times maybe (06/08/2016)   PVD (peripheral vascular disease) (HCC)    Recovering alcoholic (HCC)    picked up my 30 year chip the other day  (06/08/2016)   S/P angioplasty with stent, 06/08/16 DES, for in-stent restenosis in RCA. 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 Wonda and Dr. Shyrl.   Sleep apnea    got a mask after OR; don't use it (06/08/2016)   Type II diabetes mellitus (HCC)     Assessment: Patient Reported Symptoms:  Cognitive Cognitive Status: Alert and oriented to person, place, and time, No symptoms reported (assessment performed with daughter)      Neurological Neurological Review of Symptoms: No symptoms reported    HEENT HEENT Symptoms Reported: Not assessed      Cardiovascular Cardiovascular Symptoms Reported: No symptoms reported    Respiratory Respiratory Symptoms Reported: No symptoms reported    Endocrine Endocrine Symptoms Reported: Not assessed    Gastrointestinal Gastrointestinal Symptoms Reported: Not assessed      Genitourinary Genitourinary Symptoms Reported: Not assessed    Integumentary Integumentary Symptoms Reported: Not assessed    Musculoskeletal Musculoskelatal Symptoms Reviewed: Not assessed Additional Musculoskeletal Details: Ambulation has improved, fatigue has improved, he is back driving again. Continues to receive HH/RN/PT as ordered.        Psychosocial Psychosocial Symptoms Reported: Not assessed            07/16/2020    2:39 PM  Depression screen PHQ 2/9  Decreased Interest 0  Down, Depressed, Hopeless 0  PHQ - 2 Score 0    There were no vitals filed for this visit.  Medications Reviewed Today   Medications were not reviewed in this encounter     Recommendation:   He will follow up with Cardiology 12/22/23 for ECHO, then will see the Cardio PA, with plans to be discharged back to [facility] care.  Continue Current Plan of Care   Follow Up Plan:   Closing From:  Complex Care Management - daughter states patient has no other needs at this time. [facility], he now has a [facility] SW for any in-home PCS needs, follow up with [facility] as ordered.   Santana Stamp BSN, CCM   VBCI Population Health RN Care Manager Direct Dial: [phone]  Fax: [phone]

## 2023-12-12 ENCOUNTER — Encounter (HOSPITAL_COMMUNITY): Payer: Self-pay | Admitting: Student in an Organized Health Care Education/Training Program

## 2023-12-20 NOTE — Progress Notes (Unsigned)
 [facility]   MULTIDISCIPLINARY HEART VALVE CLINIC                                     Cardiology Office Note:    Date:  12/23/2023   ID:  Luke Harvey 1946/02/28, MRN [id number]  PCP:  Clinic, Bonni Lien  [facility] Cardiologist:  Luke LITTIE Nanas, MD (Followed at the [facility]) [facility] Structural heart: Luke Fell, MD [facility] Electrophysiologist:  None   Referring MD: Clinic, Bonni Lien   1 month s/p TAVR  History of Present Illness:    Luke Harvey is a 78 y.o. male with a hx of CAD s/p CABG x2V (2014), multiple PCI's, CKD stage IIIa, ICM w/ EF of 25 to 30%, HFrEF, HLD, T2DM, PAF on elqiuis and LFLG AS s/p TAVR (11/04/23) who presents to clinic for follow up.  Underwent CABG in 2014 with LIMA to LAD and SVG to LCx. In 2015 he underwent stenting of the RCA.  In 2018, he underwent PCI/DES for ISR of RCA in the setting of NSTEMI. He was admitted 03/2020 with unstable angina. S/p DES to SVG--> OM x 3 and staged CSI orbital atherectomy shockwave IVUS lithotripsy to prox-mid RCA and shockwave lithotripsy and POBA to distal RCA. He had accelerating chest pain in 2023. Cath showed patent LIMA to LAD, SVG to OM with 50% ostial and 25% proximal in-stent restenosis. There was 95% stenosis at the proximal marginal of mid graft stent s/p PCI/DES x1 to SVG to OM overlapping previously placed stent. Plan for DAPT with ASA/Brilinta  ideally indefinitely.    He has been followed by the [facility] but admitted to [facility] 6/29-10/26/23 for NSTEMI. Peak HStroponin 525. Cath showed severe native LCA disease with occcluded LCX and LAD, patent RCA stents, patent LIMA to LAD, SVG-OM had 70% stenosis s/p successful PCI of vein graft with 2 overlapping stents. Echo 10/25/23 showed LVEF 25-30%, LV RWMAs, G2DD, mod-severe MAC with mild-mod MR/TR, moderate AI and severe LFLG AS: mean gradient 17.7 mmHg, V-max 2.81 m/s, [name] 0.93 cm, DVI 0.29, SVI 26.  The right and left aortic  cusps were noted to be virtually immobile questioning fusion, while the posterior noncoronary cusp had near normal mobility. The regional wall motion abnormalities were not felt to correlate to his angiographic findings and felt to possibly indicate aortic stenosis as the cause of his cardiomyopathy. Outpatient TAVR consult was recommended and set up for 7/23. However, he presented back to [facility] on 10/29/2023 with worsening exertional shortness of breath. Admitted 7/5-7/15/25 for acute CHF with EF 25% and severe AS. He required IV diruesis and pleurocentesis. S/p TAVR with a 23 Luke Sapien 3 Ultra Resilia via the TF approach by Dr. Fell and Luke Harvey on 11/04/23. Follow-up echocardiogram 7/12 showed 23 mm Luke sapien 3 Ultra Resilia THV, no perivalvular leak with mean gradient of 11 mmHg and peak gradient 19 mmHg. Diagnosed with new onset PAF and started on Eliquis  2.5mg  BID. Discharged with HHPT/OT.  Seen in the ED on 12/08/23 with afib with RVR. Self converted in ER and sent home. Aspirin  discontinued (was on triple therapy given recent PCI in the setting of ACS).  Today the patient presents to clinic for follow up. He still has some mild SOB but he is 100% better since valve was replaced. His biggest issues right now are back and leg pain. No CP. No  LE edema, orthopnea or PND. No dizziness or syncope. No blood in stool or urine. No palpitations. Still seeing all his doctors at the [facility]. Sometimes have some unsteadiness with gait but today he is doing pretty well.    Past Medical History:  Diagnosis Date   Age-related macular degeneration, dry, both eyes    Anxiety    Aortic stenosis    Arthritis    probably all over (06/08/2016)   Cardiomyopathy (HCC)    a. EF 40-45% in 2018, normalized in 03/2020.   Chronic back pain    started in my lower back; going up my back in the last couple months (06/08/2016)   Chronic sinusitis    Coronary artery disease    a. s/p CABGx 2V (2014  in [place])  b. PCI (2015 in [place]). c. PCI 2018 ([facility]). d. PCI 03/2020 (complex - [facility]).   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 06/09/2016   NSTEMI (non-ST elevated myocardial infarction) (HCC) 06/08/2016   Pneumonia    3-4 times maybe (06/08/2016)   PVD (peripheral vascular disease) (HCC)    Recovering alcoholic (HCC)    picked up my 30 year chip the other day (06/08/2016)   S/P angioplasty with stent, 06/08/16 DES, for in-stent restenosis in RCA. 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 Wonda and Dr. Shyrl.   Sleep apnea    got a mask after OR; don't use it (06/08/2016)   Type II diabetes mellitus (HCC)      Current Medications: Current Meds  Medication Sig   acetaminophen  (TYLENOL ) 500 MG tablet Take 500 mg by mouth 2 (two) times daily.   amoxicillin  (AMOXIL ) 500 MG tablet Take 4 tablets (2,000 mg total) by mouth as directed. 1 hour prior to dental work including cleanings   apixaban  (ELIQUIS ) 2.5 MG TABS tablet Take 1 tablet (2.5 mg total) by mouth 2 (two) times daily.   BIOTIN PO Take 1 tablet by mouth daily at 12 noon.   carboxymethylcellulose (REFRESH PLUS) 0.5 % SOLN Place 1 drop into both eyes See admin instructions. Administer 1 drop into each eye every daily. May use every 12 hours if needed for dry eyes.   Cholecalciferol (VITAMIN D-3 PO) Take 1 tablet by mouth daily at 12 noon.   clopidogrel  (PLAVIX ) 75 MG tablet Take 1 tablet (75 mg total) by mouth daily.   Coenzyme Q10 (COQ10 PO) Take 1 capsule by  mouth daily at 12 noon.   Cyanocobalamin (VITAMIN B12 PO) Take 1 tablet by mouth daily at 12 noon.   diclofenac Sodium (VOLTAREN) 1 % GEL 1 Application every 6 (six) hours as needed for pain.   DULoxetine  (CYMBALTA ) 30 MG capsule Take 30 mg by mouth at bedtime.   empagliflozin  (JARDIANCE ) 10 MG TABS tablet Take 1 tablet (10 mg total) by mouth daily.   fluticasone  (FLONASE ) 50 MCG/ACT nasal spray Place 1-2 sprays into both nostrils daily as needed for allergies or  rhinitis.   ketoconazole (NIZORAL) 2 % cream Apply 1 Application topically 2 (two) times daily as needed for irritation.   ketoconazole (NIZORAL) 2 % shampoo Apply 1 Application topically every 3 (three) days.   levOCARNitine (L-CARNITINE PO) Take 2 tablets by mouth daily at 12 noon.   levothyroxine  (SYNTHROID ) 75 MCG tablet Take 75 mcg by mouth daily before breakfast.   lidocaine  (LIDODERM ) 5 % Place 1 patch onto the skin daily as needed (pain).   loratadine  (CLARITIN ) 10 MG tablet Take 10 mg by mouth daily at 12 noon.   MAGNESIUM  PO Take 2 tablets by mouth daily at 12 noon.   metFORMIN  (GLUCOPHAGE ) 500 MG tablet Take 1,000 mg by mouth 2 (two) times daily as needed (BS > 140). (Patient taking differently: Take 1,000 mg by mouth 2 (two) times daily as needed (BS > 140). Only taking in the morning)   metoprolol  succinate (TOPROL -XL) 25 MG 24 hr tablet Take 1 tablet (25 mg total) by mouth at bedtime.   MILK THISTLE PO Take 1 tablet by mouth daily at 12 noon.   Multiple Vitamins-Minerals (PRESERVISION AREDS 2) CAPS Take 1 capsule by mouth 2 (two) times daily.   nitroGLYCERIN  (NITROSTAT ) 0.4 MG SL tablet Place 1 tablet (0.4 mg total) under the tongue See admin instructions. Take 1 tablet (0.4) mg by mouth every day. May take an additional 1 tablet later in the day if  chest pain.   Probiotic Product (PROBIOTIC PO) Take 2 capsules by mouth daily at 12 noon.   QUERCETIN PO Take 1 tablet by mouth daily at 12 noon.   rosuvastatin  (CRESTOR ) 40 MG  tablet Take 1 tablet (40 mg total) by mouth daily.   SELENIUM PO Take 1 tablet by mouth daily at 12 noon.   spironolactone  (ALDACTONE ) 25 MG tablet Take 0.5 tablets (12.5 mg total) by mouth daily.      ROS:   Please see the history of present illness.    All other systems reviewed and are negative.  EKGs       Risk Assessment/Calculations:          Physical Exam:    VS:  BP (!) 106/58   Pulse 72   Ht 5' 6.5 (1.689 m)   Wt 136 lb (61.7 kg)   BMI 21.62 kg/m     Wt Readings from Last 3 Encounters:  12/22/23 136 lb (61.7 kg)  12/07/23 133 lb 9.6 oz (60.6 kg)  11/08/23 133 lb 9.6 oz (60.6 kg)     GEN: Well nourished, well developed in no acute distress NECK: No JVD CARDIAC: RRR, no murmurs, rubs, gallops RESPIRATORY:  Clear to auscultation without rales, wheezing or rhonchi  ABDOMEN: Soft, non-tender, non-distended EXTREMITIES:  No edema; No deformity.   ASSESSMENT:    1. S/P TAVR (transcatheter aortic valve replacement)   2. Coronary artery disease involving native coronary artery of native heart without angina pectoris   3. HFrEF (heart failure with reduced ejection fraction) (HCC)   4. Stage 3a chronic kidney disease (HCC)   5. PAF (paroxysmal atrial fibrillation) (HCC)     PLAN:    In order of problems listed above:  Severe AS s/p TAVR:  -- Echo today shows EF 50-55%, normally functioning TAVR with a mean gradient of 5 mm hg and trivial perivalvular AI at 7PM in the PSAX view, mild MR and mild PR.  -- NYHA class II symptoms with a marked improvement since TAVR. -- Continue apixiban 2.5mg  BID.  --  SBE prophylaxis discussed; I have RX'd amoxicillin . (Printed to take to [facility]) -- I will see back for 1 year office visit with echo.  Multivessel CAD: -- Status post CABG in 2014 with LIMA to LAD and SVG to circumflex/OM as well as DES to RCA.  Subsequently underwent PCI to SVG to OM in 2023 and just recently DES x 2 to SVG to OM on June 30 in the setting of  NSTEMI.  LIMA to LAD patent with otherwise severe underlying native CAD.   -- Continue Plavix  75mg  daily and Eliquis  2.5mg  BID. (Brilinta  discontinued due to intractable dyspnea out of proportion to CHF) -- Continue Crestor  40mg  daily.  -- No chest pain.    HFimpEF: -- EF 35%--> 50-55%. -- Appears euvolemic. -- Continue spironolactone  12.5mg  daily, Toprol  XL 25 mg daily and Jardiance  10mg  daily.  -- GDMT limited by hypotension.   CKD stage IIIa: -- Creat stable at 1.03 on labs 12/07/23 in ER (personally reviewed). -- Continue to monitor.   PAF: -- Continue Eliquis  2.5 mg BID.  -- Continue Toprol  XL 25mg  at bedtime     Cardiac Rehabilitation Eligibility Assessment  The patient has declined or is not appropriate for cardiac rehabilitation.    Medication Adjustments/Labs and Tests Ordered: Current medicines are reviewed at length with the patient today.  Concerns regarding medicines are outlined above.  Orders Placed This Encounter  Procedures   ECHOCARDIOGRAM COMPLETE   Meds ordered this encounter  Medications   amoxicillin  (AMOXIL ) 500 MG tablet    Sig: Take 4 tablets (2,000 mg total) by mouth as directed. 1 hour prior to dental work including cleanings    Dispense:  12 tablet    Refill:  12    Supervising Provider:   WONDA SHARPER [[id number]]    Patient Instructions  Medication Instructions:  Start Amoxicillin  500 mg, take 4 tablets by mouth 1 hour prior to dental procedures and cleanings. Printed prescription given today.  *If you need a refill on your cardiac medications before your next appointment, please call your pharmacy*  Lab Work: None needed If you have labs (blood work) drawn today and your tests are completely normal, you will receive your results only by: MyChart Message (if you have MyChart) OR A paper copy in the mail If you have any lab test that is abnormal or we need to change your treatment, we will call you to review the  results.  Testing/Procedures: 11/08/2024 Your physician has requested that you have an echocardiogram. Echocardiography is a painless test that uses sound waves to create images of your heart. It provides your doctor with information about the size and shape of your heart and how well your heart's chambers and valves are working. This procedure takes approximately one hour. There are no restrictions for this procedure. Please do NOT wear cologne, perfume, aftershave, or lotions (deodorant is allowed). Please arrive 15 minutes prior to your appointment time.  Please note: We ask at that you not bring children with you during ultrasound (echo/ vascular) testing. Due to room size and safety concerns, children are not allowed in the ultrasound rooms during exams. Our front office staff cannot provide observation of children in our lobby area while testing is being conducted. An adult accompanying a patient to their appointment will only be allowed in the ultrasound room at the discretion of the ultrasound technician under special circumstances. We apologize for any inconvenience.   Follow-Up: At [facility], you and your health needs are our priority.  As part of our continuing mission to provide you with exceptional heart care, our providers are all part of one team.  This team includes your primary Cardiologist (physician) and Advanced Practice Providers or APPs (Physician Assistants and Nurse Practitioners) who all work together to provide you with the care you need, when you need it.  Your next appointment:   As scheduled  Provider:   Izetta Hummer, PA-C    We recommend signing up for the patient portal called MyChart.  Sign up information is provided on this After Visit Summary.  MyChart is used to connect with patients for Virtual Visits (Telemedicine).  Patients are able to view lab/test results, encounter notes, upcoming appointments, etc.  Non-urgent messages can be sent to your  provider as well.   To learn more about what you can do with MyChart, go to [email or web address].    Signed, Lamarr Hummer, PA-C  12/23/2023 9:07 AM    [facility]

## 2023-12-22 ENCOUNTER — Ambulatory Visit (INDEPENDENT_AMBULATORY_CARE_PROVIDER_SITE_OTHER): Admitting: Physician Assistant

## 2023-12-22 ENCOUNTER — Ambulatory Visit (HOSPITAL_COMMUNITY)
Admission: RE | Admit: 2023-12-22 | Discharge: 2023-12-22 | Disposition: A | Discharge status: Home / Self Care | Source: Ambulatory Visit | Attending: Cardiology | Admitting: Cardiology

## 2023-12-22 VITALS — BP 106/58 | HR 72 | Ht 66.5 in | Wt 136.0 lb

## 2023-12-22 DIAGNOSIS — G4730 Sleep apnea, unspecified: Secondary | ICD-10-CM | POA: Insufficient documentation

## 2023-12-22 DIAGNOSIS — Z79899 Other long term (current) drug therapy: Secondary | ICD-10-CM | POA: Insufficient documentation

## 2023-12-22 DIAGNOSIS — I5020 Unspecified systolic (congestive) heart failure: Secondary | ICD-10-CM | POA: Diagnosis present

## 2023-12-22 DIAGNOSIS — Z7984 Long term (current) use of oral hypoglycemic drugs: Secondary | ICD-10-CM | POA: Insufficient documentation

## 2023-12-22 DIAGNOSIS — Z952 Presence of prosthetic heart valve: Secondary | ICD-10-CM | POA: Insufficient documentation

## 2023-12-22 DIAGNOSIS — I959 Hypotension, unspecified: Secondary | ICD-10-CM | POA: Diagnosis not present

## 2023-12-22 DIAGNOSIS — I130 Hypertensive heart and chronic kidney disease with heart failure and stage 1 through stage 4 chronic kidney disease, or unspecified chronic kidney disease: Secondary | ICD-10-CM | POA: Insufficient documentation

## 2023-12-22 DIAGNOSIS — Z7902 Long term (current) use of antithrombotics/antiplatelets: Secondary | ICD-10-CM | POA: Insufficient documentation

## 2023-12-22 DIAGNOSIS — I480 Paroxysmal atrial fibrillation: Secondary | ICD-10-CM | POA: Diagnosis not present

## 2023-12-22 DIAGNOSIS — Z955 Presence of coronary angioplasty implant and graft: Secondary | ICD-10-CM | POA: Diagnosis not present

## 2023-12-22 DIAGNOSIS — I252 Old myocardial infarction: Secondary | ICD-10-CM | POA: Insufficient documentation

## 2023-12-22 DIAGNOSIS — N1831 Chronic kidney disease, stage 3a: Secondary | ICD-10-CM | POA: Insufficient documentation

## 2023-12-22 DIAGNOSIS — Z951 Presence of aortocoronary bypass graft: Secondary | ICD-10-CM | POA: Diagnosis not present

## 2023-12-22 DIAGNOSIS — I2510 Atherosclerotic heart disease of native coronary artery without angina pectoris: Secondary | ICD-10-CM | POA: Insufficient documentation

## 2023-12-22 DIAGNOSIS — E785 Hyperlipidemia, unspecified: Secondary | ICD-10-CM | POA: Diagnosis not present

## 2023-12-22 DIAGNOSIS — Z7901 Long term (current) use of anticoagulants: Secondary | ICD-10-CM | POA: Insufficient documentation

## 2023-12-22 DIAGNOSIS — I429 Cardiomyopathy, unspecified: Secondary | ICD-10-CM | POA: Diagnosis not present

## 2023-12-22 DIAGNOSIS — E1122 Type 2 diabetes mellitus with diabetic chronic kidney disease: Secondary | ICD-10-CM | POA: Diagnosis not present

## 2023-12-22 LAB — ECHOCARDIOGRAM COMPLETE
AV Mean grad: 5 mmHg
AV Peak grad: 9.4 mmHg
Ao pk vel: 1.53 m/s
Area-P 1/2: 4.4 cm2
MV M vel: 4.13 m/s
MV Peak grad: 68.2 mmHg
S' Lateral: 3.8 cm

## 2023-12-22 MED ORDER — AMOXICILLIN 500 MG PO TABS: 2000.0000 mg | ORAL_TABLET | ORAL | 12 refills | Status: AC

## 2023-12-22 NOTE — Patient Instructions (Signed)
 Medication Instructions:  Start Amoxicillin  500 mg, take 4 tablets by mouth 1 hour prior to dental procedures and cleanings. Printed prescription given today.  *If you need a refill on your cardiac medications before your next appointment, please call your pharmacy*  Lab Work: None needed If you have labs (blood work) drawn today and your tests are completely normal, you will receive your results only by: MyChart Message (if you have MyChart) OR A paper copy in the mail If you have any lab test that is abnormal or we need to change your treatment, we will call you to review the results.  Testing/Procedures: 11/08/2024 Your physician has requested that you have an echocardiogram. Echocardiography is a painless test that uses sound waves to create images of your heart. It provides your doctor with information about the size and shape of your heart and how well your heart's chambers and valves are working. This procedure takes approximately one hour. There are no restrictions for this procedure. Please do NOT wear cologne, perfume, aftershave, or lotions (deodorant is allowed). Please arrive 15 minutes prior to your appointment time.  Please note: We ask at that you not bring children with you during ultrasound (echo/ vascular) testing. Due to room size and safety concerns, children are not allowed in the ultrasound rooms during exams. Our front office staff cannot provide observation of children in our lobby area while testing is being conducted. An adult accompanying a patient to their appointment will only be allowed in the ultrasound room at the discretion of the ultrasound technician under special circumstances. We apologize for any inconvenience.   Follow-Up: At [facility], you and your health needs are our priority.  As part of our continuing mission to provide you with exceptional heart care, our providers are all part of one team.  This team includes your primary Cardiologist  (physician) and Advanced Practice Providers or APPs (Physician Assistants and Nurse Practitioners) who all work together to provide you with the care you need, when you need it.  Your next appointment:   As scheduled  Provider:   Izetta Hummer, PA-C    We recommend signing up for the patient portal called MyChart.  Sign up information is provided on this After Visit Summary.  MyChart is used to connect with patients for Virtual Visits (Telemedicine).  Patients are able to view lab/test results, encounter notes, upcoming appointments, etc.  Non-urgent messages can be sent to your provider as well.   To learn more about what you can do with MyChart, go to [email or web address].

## 2023-12-23 ENCOUNTER — Ambulatory Visit: Payer: Self-pay | Admitting: Physician Assistant

## 2023-12-23 ENCOUNTER — Telehealth: Payer: Self-pay

## 2024-02-09 NOTE — Progress Notes (Signed)
 [name]                                          MRN: [id number]   02/09/2024   The VBCI Quality Team Specialist reviewed this patient medical record for the purposes of chart review for care gap closure. The following were reviewed: abstraction for care gap closure-kidney health evaluation for diabetes:eGFR  and uACR.    VBCI Quality Team

## 2024-02-21 ENCOUNTER — Ambulatory Visit (INDEPENDENT_AMBULATORY_CARE_PROVIDER_SITE_OTHER): Admitting: Orthopaedic Surgery

## 2024-02-21 ENCOUNTER — Other Ambulatory Visit

## 2024-02-21 ENCOUNTER — Other Ambulatory Visit (INDEPENDENT_AMBULATORY_CARE_PROVIDER_SITE_OTHER)

## 2024-02-21 DIAGNOSIS — M25552 Pain in left hip: Secondary | ICD-10-CM

## 2024-02-21 DIAGNOSIS — M545 Low back pain, unspecified: Secondary | ICD-10-CM

## 2024-02-21 NOTE — Progress Notes (Signed)
 Office Visit Note   Patient: Luke Harvey           Date of Birth: 08-24-1945           MRN: [id number] Visit Date: 02/21/2024              Requested by: Tobie Mis, FNP [address] [place],  [place] [address] PCP: [facility]   Assessment & Plan: Visit Diagnoses:  1. Pain in left hip   2. Low back pain, unspecified back pain laterality, unspecified chronicity, unspecified whether sciatica present     Plan: History of Present Illness Luke Harvey is an 78 year old male with a history of left total hip replacement who presents with hip and thigh pain.  He experiences persistent, dull aching pain in the hip and thigh since July, worsening with movement and varying in intensity. There is no significant improvement. He has undergone hip replacement surgeries, with the most recent by Dr. Liam. The current pain is less severe than previous sciatica. He recalls being informed about a cyst in his bone. There is no burning pain or radiation down the leg.  Physical Exam MUSCULOSKELETAL: Pain on movement of left hip, pain in thigh on palpation, and pain on lateral aspect of hip.  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 - Refer to Dr. Edna for surgical evaluation  Follow-Up Instructions: No follow-ups on file.   Orders:  Orders Placed This Encounter  Procedures   XR HIP UNILAT W OR W/O PELVIS 2-3 VIEWS LEFT   XR Lumbar Spine 2-3 Views   No orders of the defined types were placed in this encounter.     Procedures: No procedures performed   Clinical Data: No additional findings.   Subjective: Chief Complaint  Patient presents with   Left Hip - Pain   Lower Back - Pain    HPI  Review of Systems  Constitutional: Negative.   HENT: Negative.    Eyes: Negative.   Respiratory: Negative.    Cardiovascular: Negative.   Gastrointestinal:  Negative.   Endocrine: Negative.   Genitourinary: Negative.   Skin: Negative.   Allergic/Immunologic: Negative.   Neurological: Negative.   Hematological: Negative.   Psychiatric/Behavioral: Negative.    All other systems reviewed and are negative.    Objective: Vital Signs: There were no vitals taken for this visit.  Physical Exam Vitals and nursing note reviewed.  Constitutional:      Appearance: He is well-developed.  HENT:     Head: Normocephalic and atraumatic.  Eyes:     Pupils: Pupils are equal, round, and reactive to light.  Pulmonary:     Effort: Pulmonary effort is normal.  Abdominal:     Palpations: Abdomen is soft.  Musculoskeletal:        General: Normal range of motion.     Cervical back: Neck supple.  Skin:    General: Skin is warm.  Neurological:     Mental Status: He is alert and oriented to person, place, and time.  Psychiatric:        Behavior: Behavior normal.        Thought Content: Thought content normal.        Judgment: Judgment normal.     Ortho Exam  Specialty Comments:  No specialty comments available.  Imaging: XR HIP UNILAT W OR W/O PELVIS 2-3 VIEWS LEFT Result Date: 02/21/2024 X-rays  of the left hip show a left total hip arthroplasty.  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  The femoral head is eccentrically positioned within the acetabular component.  XR Lumbar Spine 2-3 Views Result Date: 02/21/2024 X-rays of the lumbar spine show age-appropriate degenerative changes.    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 06/09/2016   NSTEMI (non-ST elevated myocardial infarction) (HCC) 06/08/2016   PVD (peripheral vascular disease)    Diabetes mellitus (HCC)    Coronary artery disease with hx CABG    HLD (hyperlipidemia)    Past Medical History:  Diagnosis Date   Age-related macular degeneration, dry, both eyes    Anxiety    Aortic stenosis    Arthritis    probably all over (06/08/2016)   Cardiomyopathy (HCC)    a. EF 40-45% in 2018, normalized in 03/2020.   Chronic back pain    started in my lower back; going up my back in the last couple months (06/08/2016)   Chronic sinusitis    Coronary artery disease    a. s/p CABGx 2V (2014 in [place])  b. PCI (2015 in [place]). c. PCI 2018 ([facility]). d. PCI 03/2020 (complex - [facility]).   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 06/09/2016   NSTEMI (non-ST elevated myocardial infarction) (HCC) 06/08/2016   Pneumonia    3-4 times maybe (06/08/2016)   PVD (peripheral vascular disease)    Recovering alcoholic (HCC)    picked up my 30 year chip the other day (06/08/2016)   S/P angioplasty with stent, 06/08/16 DES, for in-stent restenosis in RCA. 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 Wonda and Dr. Shyrl.   Sleep apnea    got a mask after OR; don't use it (06/08/2016)   Type II diabetes mellitus (HCC)     Family History  Problem Relation Age of Onset   Cancer Mother     Past Surgical History:  Procedure Laterality Date   ANTERIOR CERVICAL DECOMP/DISCECTOMY FUSION     BACK SURGERY     CARDIAC CATHETERIZATION  03/2012   led to bypass   CAROTID ENDARTERECTOMY Right ~ 2014   COLONOSCOPY W/ BIOPSIES AND POLYPECTOMY     CORONARY ANGIOPLASTY WITH STENT PLACEMENT  2015; 06/08/2016   CORONARY ARTERY BYPASS GRAFT  04/10/2012   CORONARY ATHERECTOMY N/A 04/04/2020   Procedure: CORONARY ATHERECTOMY;  Surgeon: Burnard Debby LABOR, MD;  Location: [facility] INVASIVE CV LAB;  Service: Cardiovascular;  Laterality: N/A;   CORONARY STENT INTERVENTION N/A 06/08/2016   Procedure: Coronary Stent Intervention;  Surgeon: Debby LABOR Burnard, MD;  Location: [facility] INVASIVE CV LAB;  Service: Cardiovascular;  Laterality: N/A;   CORONARY STENT INTERVENTION N/A 04/02/2020   Procedure: CORONARY STENT INTERVENTION;  Surgeon: Anner Alm ORN, MD;  Location: [facility] INVASIVE CV LAB;  Service: Cardiovascular;  Laterality: N/A;   CORONARY STENT INTERVENTION N/A 04/04/2020   Procedure: CORONARY STENT INTERVENTION;  Surgeon: Burnard Debby LABOR, MD;  Location: [facility] INVASIVE CV LAB;  Service: Cardiovascular;  Laterality: N/A;   CORONARY STENT INTERVENTION N/A 11/16/2021   Procedure: CORONARY STENT INTERVENTION;  Surgeon: Mady Bruckner, MD;  Location: [facility] INVASIVE CV LAB;  Service: Cardiovascular;  Laterality: N/A;   CORONARY STENT INTERVENTION N/A 10/24/2023   Procedure: CORONARY STENT INTERVENTION;  Surgeon: Anner Alm ORN, MD;  Location: [facility] INVASIVE CV LAB;  Service: Cardiovascular;  Laterality: N/A;   CORONARY ULTRASOUND/IVUS N/A 04/04/2020   Procedure: Intravascular Ultrasound/IVUS;  Surgeon: Burnard Debby LABOR, MD;  Location: [facility] INVASIVE CV LAB;  Service: Cardiovascular;   Laterality: N/A;   INTRAOPERATIVE TRANSTHORACIC ECHOCARDIOGRAM N/A 11/04/2023   Procedure: ECHOCARDIOGRAM, TRANSTHORACIC;  Surgeon: Wonda Sharper, MD;  Location: [facility] INVASIVE CV LAB;  Service: Cardiovascular;  Laterality: N/A;   IR THORACENTESIS ASP PLEURAL SPACE W/IMG GUIDE  11/02/2023   JOINT REPLACEMENT     LEFT HEART CATH AND CORONARY ANGIOGRAPHY N/A 06/08/2016   Procedure: Left Heart Cath and Coronary Angiography;  Surgeon: Debby LABOR Burnard, MD;  Location: [facility] INVASIVE CV LAB;  Service: Cardiovascular;  Laterality: N/A;   LEFT HEART CATH AND CORS/GRAFTS ANGIOGRAPHY N/A 04/02/2020   Procedure: LEFT HEART CATH AND CORS/GRAFTS ANGIOGRAPHY;  Surgeon: Anner Alm ORN, MD;  Location: [facility] INVASIVE CV LAB;  Service: Cardiovascular;  Laterality: N/A;   LEFT HEART CATH AND CORS/GRAFTS ANGIOGRAPHY N/A 11/16/2021   Procedure: LEFT HEART CATH AND CORS/GRAFTS ANGIOGRAPHY;  Surgeon: Mady Bruckner, MD;  Location: [facility] INVASIVE CV LAB;  Service: Cardiovascular;  Laterality: N/A;   REVISION TOTAL HIP ARTHROPLASTY Left ~ 2004   RIGHT/LEFT HEART CATH AND CORONARY ANGIOGRAPHY N/A 10/24/2023   Procedure: RIGHT/LEFT HEART CATH AND CORONARY ANGIOGRAPHY;  Surgeon: Anner Alm ORN, MD;  Location: [facility] INVASIVE CV LAB;  Service: Cardiovascular;  Laterality: N/A;   TEMPORARY PACEMAKER N/A 04/04/2020   Procedure: TEMPORARY PACEMAKER;  Surgeon: Burnard Debby LABOR, MD;  Location: [facility] INVASIVE CV LAB;  Service: Cardiovascular;  Laterality: N/A;   TONSILLECTOMY     TOTAL HIP ARTHROPLASTY Left 1990s   Social History   Occupational History   Occupation: Retired  Tobacco Use   Smoking status: Former    Current packs/day: 0.00    Average packs/day: 1 pack/day for 40.0 years (40.0 ttl pk-yrs)    Types: Cigarettes    Start date: 07/17/1968    Quit date: 07/17/2008    Years since quitting: 15.6   Smokeless tobacco: Never  Substance and Sexual Activity   Alcohol  use: No    Comment: recovering alcoholic, abstinence 30 years.   attends AA weekly   Drug use: No   Sexual activity: Never

## 2024-02-21 NOTE — Addendum Note (Signed)
 Addended by: Chere Babson on: 02/21/2024 12:43 PM   Modules accepted: Orders

## 2024-02-24 ENCOUNTER — Emergency Department (HOSPITAL_COMMUNITY)

## 2024-02-24 ENCOUNTER — Encounter (HOSPITAL_COMMUNITY): Payer: Self-pay | Admitting: *Deleted

## 2024-02-24 ENCOUNTER — Inpatient Hospital Stay (HOSPITAL_COMMUNITY)
Admission: EM | Admit: 2024-02-24 | Discharge: 2024-02-28 | DRG: 321 | Disposition: A | Discharge status: Home / Self Care | Attending: Internal Medicine | Admitting: Internal Medicine

## 2024-02-24 ENCOUNTER — Other Ambulatory Visit: Payer: Self-pay

## 2024-02-24 DIAGNOSIS — I2510 Atherosclerotic heart disease of native coronary artery without angina pectoris: Secondary | ICD-10-CM | POA: Diagnosis present

## 2024-02-24 DIAGNOSIS — I129 Hypertensive chronic kidney disease with stage 1 through stage 4 chronic kidney disease, or unspecified chronic kidney disease: Secondary | ICD-10-CM | POA: Diagnosis present

## 2024-02-24 DIAGNOSIS — E1142 Type 2 diabetes mellitus with diabetic polyneuropathy: Secondary | ICD-10-CM | POA: Diagnosis present

## 2024-02-24 DIAGNOSIS — I200 Unstable angina: Secondary | ICD-10-CM | POA: Diagnosis not present

## 2024-02-24 DIAGNOSIS — N1832 Chronic kidney disease, stage 3b: Secondary | ICD-10-CM | POA: Diagnosis present

## 2024-02-24 DIAGNOSIS — F32A Depression, unspecified: Secondary | ICD-10-CM | POA: Diagnosis present

## 2024-02-24 DIAGNOSIS — T82855A Stenosis of coronary artery stent, initial encounter: Principal | ICD-10-CM | POA: Diagnosis present

## 2024-02-24 DIAGNOSIS — Z79890 Hormone replacement therapy (postmenopausal): Secondary | ICD-10-CM

## 2024-02-24 DIAGNOSIS — I5021 Acute systolic (congestive) heart failure: Secondary | ICD-10-CM

## 2024-02-24 DIAGNOSIS — Z7901 Long term (current) use of anticoagulants: Secondary | ICD-10-CM

## 2024-02-24 DIAGNOSIS — Z79899 Other long term (current) drug therapy: Secondary | ICD-10-CM

## 2024-02-24 DIAGNOSIS — E039 Hypothyroidism, unspecified: Secondary | ICD-10-CM | POA: Diagnosis present

## 2024-02-24 DIAGNOSIS — I255 Ischemic cardiomyopathy: Secondary | ICD-10-CM | POA: Diagnosis present

## 2024-02-24 DIAGNOSIS — Z7982 Long term (current) use of aspirin: Secondary | ICD-10-CM

## 2024-02-24 DIAGNOSIS — I214 Non-ST elevation (NSTEMI) myocardial infarction: Secondary | ICD-10-CM | POA: Diagnosis present

## 2024-02-24 DIAGNOSIS — Z96642 Presence of left artificial hip joint: Secondary | ICD-10-CM | POA: Diagnosis present

## 2024-02-24 DIAGNOSIS — F1021 Alcohol dependence, in remission: Secondary | ICD-10-CM | POA: Diagnosis present

## 2024-02-24 DIAGNOSIS — I252 Old myocardial infarction: Secondary | ICD-10-CM

## 2024-02-24 DIAGNOSIS — E1151 Type 2 diabetes mellitus with diabetic peripheral angiopathy without gangrene: Secondary | ICD-10-CM | POA: Diagnosis present

## 2024-02-24 DIAGNOSIS — Z7984 Long term (current) use of oral hypoglycemic drugs: Secondary | ICD-10-CM

## 2024-02-24 DIAGNOSIS — N1831 Chronic kidney disease, stage 3a: Secondary | ICD-10-CM | POA: Diagnosis present

## 2024-02-24 DIAGNOSIS — Z7902 Long term (current) use of antithrombotics/antiplatelets: Secondary | ICD-10-CM

## 2024-02-24 DIAGNOSIS — E1122 Type 2 diabetes mellitus with diabetic chronic kidney disease: Secondary | ICD-10-CM | POA: Diagnosis present

## 2024-02-24 DIAGNOSIS — Z87891 Personal history of nicotine dependence: Secondary | ICD-10-CM

## 2024-02-24 DIAGNOSIS — E1165 Type 2 diabetes mellitus with hyperglycemia: Secondary | ICD-10-CM | POA: Diagnosis present

## 2024-02-24 DIAGNOSIS — I21A9 Other myocardial infarction type: Secondary | ICD-10-CM | POA: Diagnosis present

## 2024-02-24 DIAGNOSIS — Z951 Presence of aortocoronary bypass graft: Secondary | ICD-10-CM

## 2024-02-24 DIAGNOSIS — Z952 Presence of prosthetic heart valve: Secondary | ICD-10-CM

## 2024-02-24 DIAGNOSIS — E782 Mixed hyperlipidemia: Secondary | ICD-10-CM | POA: Diagnosis present

## 2024-02-24 DIAGNOSIS — Y831 Surgical operation with implant of artificial internal device as the cause of abnormal reaction of the patient, or of later complication, without mention of misadventure at the time of the procedure: Secondary | ICD-10-CM | POA: Diagnosis present

## 2024-02-24 DIAGNOSIS — I350 Nonrheumatic aortic (valve) stenosis: Secondary | ICD-10-CM | POA: Diagnosis present

## 2024-02-24 DIAGNOSIS — I480 Paroxysmal atrial fibrillation: Secondary | ICD-10-CM | POA: Diagnosis present

## 2024-02-24 LAB — URINALYSIS, ROUTINE W REFLEX MICROSCOPIC
Bacteria, UA: NONE SEEN
Bilirubin Urine: NEGATIVE
Glucose, UA: >=500 mg/dL — AB
Hgb urine dipstick: NEGATIVE
Ketones, ur: NEGATIVE mg/dL
Leukocytes,Ua: NEGATIVE
Nitrite: NEGATIVE
Protein, ur: NEGATIVE mg/dL
Specific Gravity, Urine: 1.029 (ref 1.005–1.030)
pH: 5 (ref 5.0–8.0)

## 2024-02-24 LAB — BASIC METABOLIC PANEL WITH GFR
Anion gap: 13 (ref 5–15)
BUN: 19 mg/dL (ref 8–23)
CO2: 23 mmol/L (ref 22–32)
Calcium: 8.9 mg/dL (ref 8.9–10.3)
Chloride: 99 mmol/L (ref 98–111)
Creatinine, Ser: 1.38 mg/dL — ABNORMAL HIGH (ref 0.61–1.24)
GFR, Estimated: 52 mL/min — ABNORMAL LOW (ref >60)
Glucose, Bld: 196 mg/dL — ABNORMAL HIGH (ref 70–99)
Potassium: 3.8 mmol/L (ref 3.5–5.1)
Sodium: 135 mmol/L (ref 135–145)

## 2024-02-24 LAB — CBC
HCT: 35.7 % — ABNORMAL LOW (ref 39.0–52.0)
Hemoglobin: 11.3 g/dL — ABNORMAL LOW (ref 13.0–17.0)
MCH: 27.1 pg (ref 26.0–34.0)
MCHC: 31.7 g/dL (ref 30.0–36.0)
MCV: 85.6 fL (ref 80.0–100.0)
Platelets: 286 K/uL (ref 150–400)
RBC: 4.17 MIL/uL — ABNORMAL LOW (ref 4.22–5.81)
RDW: 13.2 % (ref 11.5–15.5)
WBC: 8.1 K/uL (ref 4.0–10.5)
nRBC: 0 % (ref 0.0–0.2)

## 2024-02-24 LAB — TROPONIN I (HIGH SENSITIVITY): Troponin I (High Sensitivity): 167 ng/L (ref <18)

## 2024-02-24 LAB — D-DIMER, QUANTITATIVE: D-Dimer, Quant: 1.05 ug{FEU}/mL — ABNORMAL HIGH (ref 0.00–0.50)

## 2024-02-24 NOTE — ED Triage Notes (Addendum)
 Crushing chest pain with radiation down both arms x1 weeks. Took 1 nitroglycerin  on the way here with some relief.

## 2024-02-25 ENCOUNTER — Emergency Department (HOSPITAL_COMMUNITY)

## 2024-02-25 DIAGNOSIS — I129 Hypertensive chronic kidney disease with stage 1 through stage 4 chronic kidney disease, or unspecified chronic kidney disease: Secondary | ICD-10-CM | POA: Diagnosis present

## 2024-02-25 DIAGNOSIS — Z7901 Long term (current) use of anticoagulants: Secondary | ICD-10-CM | POA: Diagnosis not present

## 2024-02-25 DIAGNOSIS — I21A9 Other myocardial infarction type: Secondary | ICD-10-CM | POA: Diagnosis present

## 2024-02-25 DIAGNOSIS — I2510 Atherosclerotic heart disease of native coronary artery without angina pectoris: Secondary | ICD-10-CM

## 2024-02-25 DIAGNOSIS — Z79890 Hormone replacement therapy (postmenopausal): Secondary | ICD-10-CM | POA: Diagnosis not present

## 2024-02-25 DIAGNOSIS — E785 Hyperlipidemia, unspecified: Secondary | ICD-10-CM

## 2024-02-25 DIAGNOSIS — I200 Unstable angina: Secondary | ICD-10-CM | POA: Diagnosis present

## 2024-02-25 DIAGNOSIS — E039 Hypothyroidism, unspecified: Secondary | ICD-10-CM | POA: Diagnosis present

## 2024-02-25 DIAGNOSIS — Z951 Presence of aortocoronary bypass graft: Secondary | ICD-10-CM | POA: Diagnosis not present

## 2024-02-25 DIAGNOSIS — Z79899 Other long term (current) drug therapy: Secondary | ICD-10-CM | POA: Diagnosis not present

## 2024-02-25 DIAGNOSIS — E782 Mixed hyperlipidemia: Secondary | ICD-10-CM | POA: Diagnosis present

## 2024-02-25 DIAGNOSIS — T82855A Stenosis of coronary artery stent, initial encounter: Secondary | ICD-10-CM | POA: Diagnosis present

## 2024-02-25 DIAGNOSIS — I214 Non-ST elevation (NSTEMI) myocardial infarction: Secondary | ICD-10-CM | POA: Diagnosis not present

## 2024-02-25 DIAGNOSIS — E1142 Type 2 diabetes mellitus with diabetic polyneuropathy: Secondary | ICD-10-CM | POA: Diagnosis present

## 2024-02-25 DIAGNOSIS — Z7984 Long term (current) use of oral hypoglycemic drugs: Secondary | ICD-10-CM | POA: Diagnosis not present

## 2024-02-25 DIAGNOSIS — I255 Ischemic cardiomyopathy: Secondary | ICD-10-CM | POA: Diagnosis present

## 2024-02-25 DIAGNOSIS — I480 Paroxysmal atrial fibrillation: Secondary | ICD-10-CM | POA: Diagnosis present

## 2024-02-25 DIAGNOSIS — E1165 Type 2 diabetes mellitus with hyperglycemia: Secondary | ICD-10-CM | POA: Diagnosis present

## 2024-02-25 DIAGNOSIS — Z87891 Personal history of nicotine dependence: Secondary | ICD-10-CM | POA: Diagnosis not present

## 2024-02-25 DIAGNOSIS — E1122 Type 2 diabetes mellitus with diabetic chronic kidney disease: Secondary | ICD-10-CM | POA: Diagnosis present

## 2024-02-25 DIAGNOSIS — Z96642 Presence of left artificial hip joint: Secondary | ICD-10-CM | POA: Diagnosis present

## 2024-02-25 DIAGNOSIS — R079 Chest pain, unspecified: Secondary | ICD-10-CM | POA: Diagnosis not present

## 2024-02-25 DIAGNOSIS — F32A Depression, unspecified: Secondary | ICD-10-CM | POA: Diagnosis present

## 2024-02-25 DIAGNOSIS — Y831 Surgical operation with implant of artificial internal device as the cause of abnormal reaction of the patient, or of later complication, without mention of misadventure at the time of the procedure: Secondary | ICD-10-CM | POA: Diagnosis present

## 2024-02-25 DIAGNOSIS — N1831 Chronic kidney disease, stage 3a: Secondary | ICD-10-CM | POA: Diagnosis present

## 2024-02-25 DIAGNOSIS — E1151 Type 2 diabetes mellitus with diabetic peripheral angiopathy without gangrene: Secondary | ICD-10-CM | POA: Diagnosis present

## 2024-02-25 DIAGNOSIS — I350 Nonrheumatic aortic (valve) stenosis: Secondary | ICD-10-CM | POA: Diagnosis present

## 2024-02-25 DIAGNOSIS — Z7902 Long term (current) use of antithrombotics/antiplatelets: Secondary | ICD-10-CM | POA: Diagnosis not present

## 2024-02-25 DIAGNOSIS — F1021 Alcohol dependence, in remission: Secondary | ICD-10-CM | POA: Diagnosis present

## 2024-02-25 DIAGNOSIS — I25810 Atherosclerosis of coronary artery bypass graft(s) without angina pectoris: Secondary | ICD-10-CM | POA: Diagnosis not present

## 2024-02-25 LAB — APTT
aPTT: 53 s — ABNORMAL HIGH (ref 24–36)
aPTT: 64 s — ABNORMAL HIGH (ref 24–36)

## 2024-02-25 LAB — CBC
HCT: 33.5 % — ABNORMAL LOW (ref 39.0–52.0)
Hemoglobin: 10.8 g/dL — ABNORMAL LOW (ref 13.0–17.0)
MCH: 27.1 pg (ref 26.0–34.0)
MCHC: 32.2 g/dL (ref 30.0–36.0)
MCV: 84 fL (ref 80.0–100.0)
Platelets: 259 K/uL (ref 150–400)
RBC: 3.99 MIL/uL — ABNORMAL LOW (ref 4.22–5.81)
RDW: 13.2 % (ref 11.5–15.5)
WBC: 8.5 K/uL (ref 4.0–10.5)
nRBC: 0 % (ref 0.0–0.2)

## 2024-02-25 LAB — BASIC METABOLIC PANEL WITH GFR
Anion gap: 15 (ref 5–15)
BUN: 20 mg/dL (ref 8–23)
CO2: 20 mmol/L — ABNORMAL LOW (ref 22–32)
Calcium: 8.7 mg/dL — ABNORMAL LOW (ref 8.9–10.3)
Chloride: 99 mmol/L (ref 98–111)
Creatinine, Ser: 1.28 mg/dL — ABNORMAL HIGH (ref 0.61–1.24)
GFR, Estimated: 57 mL/min — ABNORMAL LOW (ref >60)
Glucose, Bld: 169 mg/dL — ABNORMAL HIGH (ref 70–99)
Potassium: 3.9 mmol/L (ref 3.5–5.1)
Sodium: 134 mmol/L — ABNORMAL LOW (ref 135–145)

## 2024-02-25 LAB — IRON AND TIBC
Iron: 21 ug/dL — ABNORMAL LOW (ref 45–182)
Saturation Ratios: 8 % — ABNORMAL LOW (ref 17.9–39.5)
TIBC: 259 ug/dL (ref 250–450)
UIBC: 238 ug/dL

## 2024-02-25 LAB — PROTIME-INR
INR: 1.3 — ABNORMAL HIGH (ref 0.8–1.2)
Prothrombin Time: 16.7 s — ABNORMAL HIGH (ref 11.4–15.2)

## 2024-02-25 LAB — HEPARIN LEVEL (UNFRACTIONATED)
Heparin Unfractionated: 0.82 [IU]/mL — ABNORMAL HIGH (ref 0.30–0.70)
Heparin Unfractionated: 0.57 [IU]/mL (ref 0.30–0.70)

## 2024-02-25 LAB — MRSA NEXT GEN BY PCR, NASAL: MRSA by PCR Next Gen: DETECTED — AB

## 2024-02-25 LAB — TROPONIN I (HIGH SENSITIVITY): Troponin I (High Sensitivity): 157 ng/L (ref <18)

## 2024-02-25 MED ORDER — CLOPIDOGREL BISULFATE 75 MG PO TABS
75.0000 mg | ORAL_TABLET | Freq: Every day | ORAL | Status: DC
  Administered 2024-02-25 – 2024-02-28 (×4): 75 mg via ORAL
  Filled 2024-02-25 (×4): qty 1

## 2024-02-25 MED ORDER — ACETAMINOPHEN 650 MG RE SUPP: 650.0000 mg | Freq: Four times a day (QID) | RECTAL | Status: DC | PRN

## 2024-02-25 MED ORDER — CHLORHEXIDINE GLUCONATE CLOTH 2 % EX PADS
6.0000 | MEDICATED_PAD | Freq: Every day | CUTANEOUS | Status: DC
  Administered 2024-02-25 – 2024-02-27 (×3): 6 via TOPICAL

## 2024-02-25 MED ORDER — MUPIROCIN 2 % EX OINT
1.0000 | TOPICAL_OINTMENT | Freq: Two times a day (BID) | CUTANEOUS | Status: DC
  Administered 2024-02-26 – 2024-02-28 (×4): 1 via NASAL
  Filled 2024-02-25 (×2): qty 22

## 2024-02-25 MED ORDER — HYDRALAZINE HCL 20 MG/ML IJ SOLN
10.0000 mg | INTRAMUSCULAR | Status: DC | PRN
Start: 2024-02-25 — End: 2024-02-27

## 2024-02-25 MED ORDER — SPIRONOLACTONE 12.5 MG HALF TABLET
12.5000 mg | ORAL_TABLET | Freq: Every day | ORAL | Status: DC
  Administered 2024-02-25 – 2024-02-28 (×4): 12.5 mg via ORAL
  Filled 2024-02-25 (×4): qty 1

## 2024-02-25 MED ORDER — ONDANSETRON HCL 4 MG/2ML IJ SOLN: 4.0000 mg | Freq: Four times a day (QID) | INTRAMUSCULAR | Status: DC | PRN

## 2024-02-25 MED ORDER — HEPARIN (PORCINE) 25000 UT/250ML-% IV SOLN
1150.0000 [IU]/h | INTRAVENOUS | Status: DC
  Administered 2024-02-25: 750 [IU]/h via INTRAVENOUS
  Administered 2024-02-26 – 2024-02-27 (×2): 1000 [IU]/h via INTRAVENOUS
  Filled 2024-02-25 (×3): qty 250

## 2024-02-25 MED ORDER — ONDANSETRON HCL 4 MG PO TABS: 4.0000 mg | ORAL_TABLET | Freq: Four times a day (QID) | ORAL | Status: DC | PRN

## 2024-02-25 MED ORDER — LEVOTHYROXINE SODIUM 75 MCG PO TABS
75.0000 ug | ORAL_TABLET | Freq: Every day | ORAL | Status: DC
  Administered 2024-02-25 – 2024-02-28 (×4): 75 ug via ORAL
  Filled 2024-02-25 (×4): qty 1

## 2024-02-25 MED ORDER — METOPROLOL SUCCINATE ER 25 MG PO TB24
25.0000 mg | ORAL_TABLET | Freq: Every day | ORAL | Status: DC
  Administered 2024-02-25 – 2024-02-27 (×3): 25 mg via ORAL
  Filled 2024-02-25 (×3): qty 1

## 2024-02-25 MED ORDER — NITROGLYCERIN 0.4 MG SL SUBL
0.4000 mg | SUBLINGUAL_TABLET | SUBLINGUAL | Status: DC | PRN
  Administered 2024-02-25: 0.4 mg via SUBLINGUAL
  Filled 2024-02-25: qty 1

## 2024-02-25 MED ORDER — ROSUVASTATIN CALCIUM 20 MG PO TABS
40.0000 mg | ORAL_TABLET | Freq: Every day | ORAL | Status: DC
  Administered 2024-02-25 – 2024-02-28 (×4): 40 mg via ORAL
  Filled 2024-02-25 (×4): qty 2

## 2024-02-25 MED ORDER — IOHEXOL 350 MG/ML SOLN
75.0000 mL | Freq: Once | INTRAVENOUS | Status: AC | PRN
  Administered 2024-02-25: 75 mL via INTRAVENOUS

## 2024-02-25 MED ORDER — DULOXETINE HCL 30 MG PO CPEP
30.0000 mg | ORAL_CAPSULE | Freq: Every day | ORAL | Status: DC
  Administered 2024-02-25 – 2024-02-27 (×3): 30 mg via ORAL
  Filled 2024-02-25 (×3): qty 1

## 2024-02-25 MED ORDER — ACETAMINOPHEN 325 MG PO TABS: 650.0000 mg | ORAL_TABLET | Freq: Four times a day (QID) | ORAL | Status: DC | PRN

## 2024-02-25 NOTE — ED Provider Notes (Signed)
 [place] EMERGENCY DEPARTMENT AT [facility] Provider Note   CSN: [id number] Arrival date & time: 02/24/24  2135     Patient presents with: Chest Pain   Luke Harvey is a 78 y.o. male.   The history is provided by the patient.  Chest Pain Pain location:  Substernal area Pain quality: tightness   Pain radiates to:  L arm and R arm Pain severity:  Moderate Onset quality:  Gradual Duration:  1 week Timing:  Intermittent Progression:  Waxing and waning Chronicity:  Recurrent Context: at rest   Relieved by:  Nothing Worsened by:  Nothing Ineffective treatments:  Nitroglycerin  Associated symptoms: no fever, no vomiting and no weakness   Risk factors: coronary artery disease and male sex   Patient with complex medical history presents with CP intermittent x 1 week with radiation down BUE  Past Medical History:  Diagnosis Date   Age-related macular degeneration, dry, both eyes    Anxiety    Aortic stenosis    Arthritis    probably all over (06/08/2016)   Cardiomyopathy (HCC)    a. EF 40-45% in 2018, normalized in 03/2020.   Chronic back pain    started in my lower back; going up my back in the last couple months (06/08/2016)   Chronic sinusitis    Coronary artery disease    a. s/p CABGx 2V (2014 in [place])  b. PCI (2015 in [place]). c. PCI 2018 ([facility]). d. PCI 03/2020 (complex - [facility]).   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 06/09/2016   NSTEMI (non-ST elevated myocardial infarction) (HCC) 06/08/2016   Pneumonia    3-4 times maybe (06/08/2016)   PVD (peripheral vascular disease)     Recovering alcoholic (HCC)    picked up my 30 year chip the other day (06/08/2016)   S/P angioplasty with stent, 06/08/16 DES, for in-stent restenosis in RCA. 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 Wonda and Dr. Shyrl.   Sleep apnea    got a mask after OR; don't use it (06/08/2016)   Type II diabetes mellitus (HCC)       Prior to Admission medications   Medication Sig Start Date End Date Taking? Authorizing Provider  acetaminophen  (TYLENOL ) 500 MG tablet Take 500 mg by mouth 2 (two) times daily.    [provider]  amoxicillin  (AMOXIL ) 500 MG tablet Take 4 tablets (2,000 mg total) by mouth as directed. 1 hour prior to dental work including cleanings 12/22/23   Sebastian Lamarr SAUNDERS, PA-C  apixaban  (ELIQUIS ) 2.5 MG TABS tablet Take 1 tablet (2.5 mg total) by mouth 2 (two) times daily. 11/08/23   Henry Shaver B, NP  BIOTIN PO Take 1 tablet by mouth daily at 12 noon.    [provider]  carboxymethylcellulose (REFRESH PLUS) 0.5 % SOLN Place 1 drop into both eyes See admin instructions. Administer 1 drop into each eye every daily. May use every 12  hours if needed for dry eyes.    [provider]  Cholecalciferol (VITAMIN D-3 PO) Take 1 tablet by mouth daily at 12 noon.    [provider]  clopidogrel  (PLAVIX ) 75 MG tablet Take 1 tablet (75 mg total) by mouth daily. 11/09/23   Henry Manuelita NOVAK, NP  Coenzyme Q10 (COQ10 PO) Take 1 capsule by mouth daily at 12 noon.    [provider]  Cyanocobalamin (VITAMIN B12 PO) Take 1 tablet by mouth daily at 12 noon.    [provider]  diclofenac Sodium (VOLTAREN) 1 % GEL 1 Application every 6 (six) hours as needed for pain. 02/02/22   [provider]  DULoxetine  (CYMBALTA ) 30 MG capsule Take 30 mg by mouth at bedtime.    [provider]  empagliflozin  (JARDIANCE ) 10 MG TABS tablet  Take 1 tablet (10 mg total) by mouth daily. 10/27/23   Amin, Ankit C, MD  fluticasone  (FLONASE ) 50 MCG/ACT nasal spray Place 1-2 sprays into both nostrils daily as needed for allergies or rhinitis.    [provider]  ketoconazole (NIZORAL) 2 % cream Apply 1 Application topically 2 (two) times daily as needed for irritation. 02/23/21   [provider]  ketoconazole (NIZORAL) 2 % shampoo Apply 1 Application topically every 3 (three) days.    [provider]  levOCARNitine (L-CARNITINE PO) Take 2 tablets by mouth daily at 12 noon.    [provider]  levothyroxine  (SYNTHROID ) 75 MCG tablet Take 75 mcg by mouth daily before breakfast. 02/02/22   [provider]  lidocaine  (LIDODERM ) 5 % Place 1 patch onto the skin daily as needed (pain).    [provider]  loratadine  (CLARITIN ) 10 MG tablet Take 10 mg by mouth daily at 12 noon.    [provider]  MAGNESIUM  PO Take 2 tablets by mouth daily at 12 noon.    [provider]  metFORMIN  (GLUCOPHAGE ) 500 MG tablet Take 1,000 mg by mouth 2 (two) times daily as needed (BS > 140). Patient taking differently: Take 1,000 mg by mouth 2 (two) times daily as needed (BS > 140). Only taking in the morning 05/04/06   [provider]  metoprolol  succinate (TOPROL -XL) 25 MG 24 hr tablet Take 1 tablet (25 mg total) by mouth at bedtime. 11/08/23   Henry Manuelita B, NP  MILK THISTLE PO Take 1 tablet by mouth daily at 12 noon.    [provider]  Multiple Vitamins-Minerals (PRESERVISION AREDS 2) CAPS Take 1 capsule by mouth 2 (two) times daily.    [provider]  nitroGLYCERIN  (NITROSTAT ) 0.4 MG SL tablet Place 1 tablet (0.4 mg total) under the tongue See admin instructions. Take 1 tablet (0.4) mg by mouth every day. May take an additional 1 tablet later in the day if  chest pain. 10/26/23   Amin, Ankit C, MD  OVER THE COUNTER MEDICATION Take 1 capsule by mouth daily at 12 noon.  VisiUltra    [provider]  OVER THE COUNTER MEDICATION Take 1 capsule by mouth daily at 12 noon. blood boost supplement for glucose control    [provider]  Probiotic Product (PROBIOTIC PO) Take 2 capsules by mouth daily at 12 noon.    [provider]  QUERCETIN PO Take 1 tablet by mouth daily at 12 noon.    [provider]  rosuvastatin  (CRESTOR ) 40 MG tablet Take 1 tablet (40 mg total) by mouth daily. 04/05/20   Dunn, Dayna N,  PA-C  SELENIUM PO Take 1 tablet by mouth daily at 12 noon.    [provider]  spironolactone  (ALDACTONE ) 25 MG tablet Take 0.5 tablets (12.5 mg total) by mouth daily. 11/08/23   Henry Manuelita NOVAK, NP    Allergies: Tetracyclines & related, Cozaar [losartan], Zestril  [lisinopril ], and Lipitor [atorvastatin]    Review of Systems  Constitutional:  Negative for fever.  Respiratory:  Negative for wheezing and stridor.   Cardiovascular:  Positive for chest pain.  Gastrointestinal:  Negative for vomiting.  Neurological:  Negative for weakness.  All other systems reviewed and are negative.   Updated Vital Signs BP (!) 135/91   Pulse (!) 112   Temp 98 F (36.7 C) (Oral)   Resp 16   Ht 5' 6 (1.676 m)   Wt 61.7 kg   SpO2 93%   BMI 21.95 kg/m   Physical Exam Vitals and nursing note reviewed.  Constitutional:      General: He is not in acute distress.    Appearance: Normal appearance. He is well-developed. He is not diaphoretic.  HENT:     Head: Normocephalic and atraumatic.     Nose: Nose normal.  Eyes:     Conjunctiva/sclera: Conjunctivae normal.     Pupils: Pupils are equal, round, and reactive to light.  Cardiovascular:     Rate and Rhythm: Normal rate and regular rhythm.     Pulses: Normal pulses.     Heart sounds: Normal heart sounds.  Pulmonary:     Effort: Pulmonary effort is normal.     Breath sounds: Normal breath sounds. No wheezing or rales.  Abdominal:     General: Bowel sounds are  normal.     Palpations: Abdomen is soft.     Tenderness: There is no abdominal tenderness. There is no guarding or rebound.  Musculoskeletal:        General: Normal range of motion.     Cervical back: Normal range of motion and neck supple.  Skin:    General: Skin is warm and dry.     Capillary Refill: Capillary refill takes less than 2 seconds.  Neurological:     General: No focal deficit present.     Mental Status: He is alert and oriented to person, place, and time.     Deep Tendon Reflexes: Reflexes normal.  Psychiatric:        Mood and Affect: Mood normal.        Behavior: Behavior normal.     (all labs ordered are listed, but only abnormal results are displayed) Results for orders placed or performed during the hospital encounter of 02/24/24  Basic metabolic panel   Collection Time: 02/24/24  9:59 PM  Result Value Ref Range   Sodium 135 135 - 145 mmol/L   Potassium 3.8 3.5 - 5.1 mmol/L   Chloride 99 98 - 111 mmol/L   CO2 23 22 - 32 mmol/L   Glucose, Bld 196 (H) 70 - 99 mg/dL   BUN 19 8 - 23 mg/dL   Creatinine, Ser 8.61 (H) 0.61 - 1.24 mg/dL   Calcium  8.9 8.9 - 10.3 mg/dL   GFR, Estimated 52 (L) >60 mL/min   Anion gap 13 5 - 15  CBC   Collection Time: 02/24/24  9:59 PM  Result Value Ref Range   WBC 8.1 4.0 - 10.5 K/uL   RBC 4.17 (L) 4.22 - 5.81 MIL/uL   Hemoglobin 11.3 (L) 13.0 - 17.0 g/dL   HCT 64.2 (L) 60.9 -  52.0 %   MCV 85.6 80.0 - 100.0 fL   MCH 27.1 26.0 - 34.0 pg   MCHC 31.7 30.0 - 36.0 g/dL   RDW 86.7 88.4 - 84.4 %   Platelets 286 150 - 400 K/uL   nRBC 0.0 0.0 - 0.2 %  D-dimer, quantitative   Collection Time: 02/24/24  9:59 PM  Result Value Ref Range   D-Dimer, Quant 1.05 (H) 0.00 - 0.50 ug/mL-FEU  Troponin I (High Sensitivity)   Collection Time: 02/24/24  9:59 PM  Result Value Ref Range   Troponin I (High Sensitivity) 167 (HH) <18 ng/L  Urinalysis, Routine w reflex microscopic -Urine, Clean Catch   Collection Time: 02/24/24 10:03 PM  Result Value  Ref Range   Color, Urine YELLOW YELLOW   APPearance CLEAR CLEAR   Specific Gravity, Urine 1.029 1.005 - 1.030   pH 5.0 5.0 - 8.0   Glucose, UA >=500 (A) NEGATIVE mg/dL   Hgb urine dipstick NEGATIVE NEGATIVE   Bilirubin Urine NEGATIVE NEGATIVE   Ketones, ur NEGATIVE NEGATIVE mg/dL   Protein, ur NEGATIVE NEGATIVE mg/dL   Nitrite NEGATIVE NEGATIVE   Leukocytes,Ua NEGATIVE NEGATIVE   RBC / HPF 0-5 0 - 5 RBC/hpf   WBC, UA 0-5 0 - 5 WBC/hpf   Bacteria, UA NONE SEEN NONE SEEN   Squamous Epithelial / HPF 0-5 0 - 5 /HPF  Troponin I (High Sensitivity)   Collection Time: 02/25/24 12:12 AM  Result Value Ref Range   Troponin I (High Sensitivity) 157 (HH) <18 ng/L   CT Angio Chest PE W and/or Wo Contrast Result Date: 02/25/2024 EXAM: CTA of the Chest with contrast for PE 02/25/2024 12:31:37 AM TECHNIQUE: CTA of the chest was performed after the administration of intravenous contrast. Multiplanar reformatted images are provided for review. MIP images are provided for review. Automated exposure control, iterative reconstruction, and/or weight based adjustment of the mA/kV was utilized to reduce the radiation dose to as low as reasonably achievable. COMPARISON: 12/07/2023 CLINICAL HISTORY: Syncope/presyncope, cerebrovascular cause suspected. FINDINGS: PULMONARY ARTERIES: Pulmonary arteries are adequately opacified for evaluation. No pulmonary embolism. Main pulmonary artery is normal in caliber. MEDIASTINUM: The heart and pericardium demonstrate no acute abnormality. Aortic atherosclerosis. There is no acute abnormality of the thoracic aorta. LYMPH NODES: No mediastinal, hilar or axillary lymphadenopathy. LUNGS AND PLEURA: The lungs are without acute process. No focal consolidation or pulmonary edema. Previously seen bilateral effusions have resolved. Scarring in the lower lobes stable. No pneumothorax. UPPER ABDOMEN: Limited images of the upper abdomen are unremarkable. SOFT TISSUES AND BONES: No acute bone or  soft tissue abnormality. IMPRESSION: 1. No pulmonary embolism. 2. Bibasilar scarring. 3. Aortic atherosclerosis. 4. No acute findings. Electronically signed by: Franky Crease MD 02/25/2024 12:37 AM EDT RP Workstation: [id number]   DG Chest 2 View Result Date: 02/24/2024 CLINICAL DATA:  Chest pain for 1 week EXAM: CHEST - 2 VIEW COMPARISON:  12/07/2023 FINDINGS: Frontal and lateral views of the chest demonstrate postsurgical changes from median sternotomy and aortic valve prosthesis. Cardiac silhouette is unremarkable. Increased prominence of the pulmonary interstitium consistent with developing interstitial edema and mild volume overload. No effusions or pneumothorax. No acute bony abnormalities. IMPRESSION: 1. Increased interstitial prominence compatible with developing interstitial edema. Electronically Signed   By: Ozell Daring M.D.   On: 02/24/2024 22:29   XR HIP UNILAT W OR W/O PELVIS 2-3 VIEWS LEFT Result Date: 02/21/2024 X-rays of the left hip show a left total hip arthroplasty.  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  The femoral head is eccentrically positioned within the acetabular component.  XR Lumbar Spine 2-3 Views Result Date: 02/21/2024 X-rays of the lumbar spine show age-appropriate degenerative changes.   EKG: EKG Interpretation Date/Time:  Friday February 24 2024 23:03:30 EDT Ventricular Rate:  108 PR Interval:  177 QRS Duration:  118 QT Interval:  339 QTC Calculation: 455 R Axis:   -8  Text Interpretation: Sinus tachycardia Probable LVH with secondary repol abnrm Confirmed by Djeneba Barsch ([id number]) on 02/24/2024 11:04:36 PM  Radiology: CT Angio Chest PE W and/or Wo Contrast Result Date: 02/25/2024 EXAM: CTA of the Chest with contrast for PE 02/25/2024 12:31:37 AM TECHNIQUE: CTA of the chest was performed after the administration of intravenous contrast. Multiplanar reformatted images are provided for review.  MIP images are provided for review. Automated exposure control, iterative reconstruction, and/or weight based adjustment of the mA/kV was utilized to reduce the radiation dose to as low as reasonably achievable. COMPARISON: 12/07/2023 CLINICAL HISTORY: Syncope/presyncope, cerebrovascular cause suspected. FINDINGS: PULMONARY ARTERIES: Pulmonary arteries are adequately opacified for evaluation. No pulmonary embolism. Main pulmonary artery is normal in caliber. MEDIASTINUM: The heart and pericardium demonstrate no acute abnormality. Aortic atherosclerosis. There is no acute abnormality of the thoracic aorta. LYMPH NODES: No mediastinal, hilar or axillary lymphadenopathy. LUNGS AND PLEURA: The lungs are without acute process. No focal consolidation or pulmonary edema. Previously seen bilateral effusions have resolved. Scarring in the lower lobes stable. No pneumothorax. UPPER ABDOMEN: Limited images of the upper abdomen are unremarkable. SOFT TISSUES AND BONES: No acute bone or soft tissue abnormality. IMPRESSION: 1. No pulmonary embolism. 2. Bibasilar scarring. 3. Aortic atherosclerosis. 4. No acute findings. Electronically signed by: Franky Crease MD 02/25/2024 12:37 AM EDT RP Workstation: [id number]   DG Chest 2 View Result Date: 02/24/2024 CLINICAL DATA:  Chest pain for 1 week EXAM: CHEST - 2 VIEW COMPARISON:  12/07/2023 FINDINGS: Frontal and lateral views of the chest demonstrate postsurgical changes from median sternotomy and aortic valve prosthesis. Cardiac silhouette is unremarkable. Increased prominence of the pulmonary interstitium consistent with developing interstitial edema and mild volume overload. No effusions or pneumothorax. No acute bony abnormalities. IMPRESSION: 1. Increased interstitial prominence compatible with developing interstitial edema. Electronically Signed   By: Ozell Daring M.D.   On: 02/24/2024 22:29     .Critical Care  Performed by: Nettie Earing, MD Authorized by: Nettie Earing, MD   Critical care provider statement:    Critical care time (minutes):  30   Critical care end time:  02/25/2024 2:05 AM   Critical care was necessary to treat or prevent imminent or life-threatening deterioration of the following conditions:  Cardiac failure   Critical care was time spent personally by me on the following activities:  Development of treatment plan with patient or surrogate, discussions with consultants, evaluation of patient's response to treatment, examination of patient, ordering and review of laboratory studies, ordering and review of radiographic studies, ordering and performing treatments and interventions, pulse oximetry, re-evaluation of patient's condition and review of old charts   I assumed direction of critical care for this patient from another provider in my specialty: no     Care discussed with: admitting provider   Comments:     Seen by Cardiology Admit to Dr. Dena     Medications Ordered in the ED  nitroGLYCERIN  (NITROSTAT ) SL tablet 0.4 mg (0.4 mg Sublingual Given 02/25/24 0102)  heparin  ADULT infusion 100 units/mL (25000 units/250mL) (750  Units/hr Intravenous New Bag/Given 02/25/24 0143)  iohexol  (OMNIPAQUE ) 350 MG/ML injection 75 mL (75 mLs Intravenous Contrast Given 02/25/24 0032)                                    Medical Decision Making Patient with SSCP with radiation BUE  Amount and/or Complexity of Data Reviewed External Data Reviewed: ECG and notes.    Details: Previous notes and EKG reviewed Labs: ordered.    Details: First troponin elevated 167, second elevated 157. Normal white count, low hemoglobin 11.3 normal platelets.  Normal sodium 135, normal potassium 3.8, elevated creatinine 1.38  Radiology: ordered and independent interpretation performed.    Details: No PE by me on CTA ECG/medicine tests: ordered and independent interpretation performed. Decision-making details documented in ED Course.  Risk Prescription drug  management. Decision regarding hospitalization. Risk Details: Heparin  initiated for ACS     Final diagnoses:  Unstable angina (HCC)   The patient appears reasonably stabilized for admission considering the current resources, flow, and capabilities available in the ED at this time, and I doubt any other [facility] requiring further screening and/or treatment in the ED prior to admission.  ED Discharge Orders     None          Alizeh Madril, MD 02/25/24 [phone]

## 2024-02-25 NOTE — Progress Notes (Signed)
 Progress Note  Patient Name: Luke Harvey Date of Encounter: 02/25/2024 [facility] Cardiologist: Lonni LITTIE Nanas, MD   Interval Summary   Feeling better.  May have urinated some in the bed he states.  Chest pain has improved.  Vital Signs Vitals:   02/25/24 1000 02/25/24 1230 02/25/24 1401 02/25/24 1440  BP: 130/77 139/75  136/85  Pulse: 100 98  95  Resp: 18 (!) 24  16  Temp:   97.8 F (36.6 C) 98.2 F (36.8 C)  TempSrc:   Oral Oral  SpO2: 98% 98%  94%  Weight:      Height:       No intake or output data in the 24 hours ending 02/25/24 1536    02/24/2024    9:49 PM 12/22/2023    1:25 PM 12/07/2023    3:29 PM  Last 3 Weights  Weight (lbs) 136 lb 0.4 oz 136 lb 133 lb 9.6 oz  Weight (kg) 61.7 kg 61.689 kg 60.6 kg      Telemetry/ECG  No adverse arrhythmias- Personally Reviewed  Physical Exam  GEN: No acute distress.   Neck: No JVD Cardiac: RRR, minimal systolic murmur, rubs, or gallops.  Respiratory: Clear to auscultation bilaterally. GI: Soft, nontender, non-distended  MS: No edema  Cardiac catheterization 10/24/2023:  Diagnostic               Dominance: Right                                                             Intervention                                               Assessment & Plan   78 year old male with complex cardiac history including CABG prior PCI to grafts, TAVR who presented with 1 week of intermittent chest discomfort, elevated troponin 160, pain relieved with nitroglycerin , feels better with IV heparin  currently.  Non-STEMI - May make sense for us  to pursue cardiac catheterization on Monday to view his bypass grafts and possibly intervene to help him with his intermittent anginal symptoms which were quite severe he states.  On the other hand, we could continue with medication management if he is feeling better ambulating well without any significant anginal symptoms.  We will determine more tomorrow.  CAD - Continue  with Plavix  75 mg.  Hyperlipidemia - Continue with rosuvastatin  40 mg a day  Ischemic cardiomyopathy last EF 50 to 55% on 12/22/2023 - Continue with Toprol  25, spironolactone  12.5 mg a day.  CRITICAL CARE Performed by: Oneil Parchment   Total critical care time: 35 minutes  Critical care time was exclusive of separately billable procedures and treating other patients.  Critical care was necessary to treat or prevent imminent or life-threatening deterioration.  Critical care was time spent personally by me on the following activities: development of treatment plan with patient and/or surrogate as well as nursing, discussions with consultants, evaluation of patient's response to treatment, examination of patient, obtaining history from patient or surrogate, ordering and performing treatments and interventions, ordering and review of laboratory studies, ordering and review of radiographic studies, pulse oximetry and re-evaluation  of patient's condition.    For questions or updates, please contact [facility] Please consult [email or web address] for contact info under         Signed, Oneil Parchment, MD

## 2024-02-25 NOTE — ED Notes (Signed)
 Pt 91% RA and RR 26, 2L [place] applied, hx copd.

## 2024-02-25 NOTE — Progress Notes (Signed)
 PHARMACY - ANTICOAGULATION CONSULT NOTE  Pharmacy Consult for heparin  Indication: chest pain/ACS  Allergies  Allergen Reactions   Tetracyclines & Related Hives and Itching   Cozaar [Losartan] Cough   Zestril  [Lisinopril ] Cough   Lipitor [Atorvastatin] Other (See Comments)    Myalgias     Patient Measurements: Height: 5' 6 (167.6 cm) Weight: 61.7 kg (136 lb 0.4 oz) IBW/kg (Calculated) : 63.8 HEPARIN  DW (KG): 61.7  Vital Signs: Temp: 98.6 F (37 C) (11/01 0952) Temp Source: Oral (11/01 0952) BP: 130/77 (11/01 1000) Pulse Rate: 100 (11/01 1000)  Labs: Recent Labs    02/24/24 2159 02/25/24 0012 02/25/24 0435 02/25/24 1000  HGB 11.3*  --  10.8*  --   HCT 35.7*  --  33.5*  --   PLT 286  --  259  --   APTT  --   --   --  53*  LABPROT  --   --  16.7*  --   INR  --   --  1.3*  --   CREATININE 1.38*  --  1.28*  --   TROPONINIHS 167* 157*  --   --     Estimated Creatinine Clearance: 40.2 mL/min (A) (by C-G formula based on SCr of 1.28 mg/dL (H)).   Medical History: Past Medical History:  Diagnosis Date   Age-related macular degeneration, dry, both eyes    Anxiety    Aortic stenosis    Arthritis    probably all over (06/08/2016)   Cardiomyopathy (HCC)    a. EF 40-45% in 2018, normalized in 03/2020.   Chronic back pain    started in my lower back; going up my back in the last couple months (06/08/2016)   Chronic sinusitis    Coronary artery disease    a. s/p CABGx 2V (2014 in [place])  b. PCI (2015 in [place]). c. PCI 2018 ([facility]). d. PCI 03/2020 (complex - [facility]).   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 06/09/2016   NSTEMI (non-ST elevated myocardial infarction) (HCC) 06/08/2016   Pneumonia    3-4 times maybe (06/08/2016)   PVD (peripheral vascular disease)    Recovering alcoholic (HCC)    picked up my 30 year chip the other day (06/08/2016)   S/P angioplasty with stent, 06/08/16 DES, for in-stent restenosis in RCA. 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 Wonda and Dr. Shyrl.   Sleep apnea    got a mask after OR; don't use it (06/08/2016)   Type II diabetes mellitus (HCC)     Medications:  No current facility-administered medications on file prior to encounter.   Current Outpatient Medications on File Prior to Encounter  Medication Sig Dispense Refill   acetaminophen  (TYLENOL ) 500 MG tablet Take 500 mg by mouth 2 (two) times daily.     amoxicillin  (AMOXIL ) 500 MG tablet Take 4 tablets (2,000 mg total) by mouth as directed. 1 hour prior to dental work including cleanings  12 tablet 12   apixaban  (ELIQUIS ) 2.5 MG TABS tablet Take 1 tablet (2.5 mg total) by mouth 2 (two) times daily. 60 tablet 2   BIOTIN PO Take 1 tablet by mouth daily at 12 noon.     carboxymethylcellulose (REFRESH PLUS) 0.5 % SOLN Place 1 drop into both eyes See admin instructions. Administer 1 drop into each eye every daily. May use every 12 hours if needed for dry eyes.     Cholecalciferol (VITAMIN D-3 PO) Take 1 tablet by mouth daily at 12 noon.     clopidogrel  (PLAVIX ) 75 MG tablet Take 1 tablet (75 mg total) by mouth daily. 90 tablet 2   Coenzyme Q10 (COQ10 PO) Take 1 capsule by mouth daily at 12 noon.     Cyanocobalamin (VITAMIN B12 PO) Take 1 tablet by mouth daily at 12 noon.     diclofenac Sodium (VOLTAREN) 1 % GEL 1 Application every 6 (six) hours as needed for pain.     DULoxetine  (CYMBALTA ) 30 MG capsule Take 30 mg by mouth at bedtime.     empagliflozin  (JARDIANCE ) 10 MG TABS tablet Take 1  tablet (10 mg total) by mouth daily.     fluticasone  (FLONASE ) 50 MCG/ACT nasal spray Place 1-2 sprays into both nostrils daily as needed for allergies or rhinitis.     ketoconazole (NIZORAL) 2 % cream Apply 1 Application topically 2 (two) times daily as needed for irritation.     ketoconazole (NIZORAL) 2 % shampoo Apply 1 Application topically every 3 (three) days.     levOCARNitine (L-CARNITINE PO) Take 2 tablets by mouth daily at 12 noon.     levothyroxine  (SYNTHROID ) 75 MCG tablet Take 75 mcg by mouth daily before breakfast.     lidocaine  (LIDODERM ) 5 % Place 1 patch onto the skin daily as needed (pain).     loratadine  (CLARITIN ) 10 MG tablet Take 10 mg by mouth daily at 12 noon.     MAGNESIUM  PO Take 2 tablets by mouth daily at 12 noon.     metFORMIN  (GLUCOPHAGE ) 500 MG tablet Take 1,000 mg by mouth 2 (two) times daily as needed (BS > 140). (Patient taking differently: Take 1,000 mg by mouth 2 (two) times daily as needed (BS > 140). Only taking in the morning)     metoprolol  succinate (TOPROL -XL) 25 MG 24 hr tablet Take 1 tablet (25 mg total) by mouth at bedtime. 90 tablet 1   MILK THISTLE PO Take 1 tablet by mouth daily at 12 noon.     Multiple Vitamins-Minerals (PRESERVISION AREDS 2) CAPS Take 1 capsule by mouth 2 (two) times daily.     nitroGLYCERIN  (NITROSTAT ) 0.4 MG SL tablet Place 1 tablet (0.4 mg total) under the tongue See admin instructions. Take 1 tablet (0.4) mg by mouth every day. May take an additional 1 tablet later in the day if  chest pain.     OVER THE COUNTER MEDICATION Take 1 capsule by mouth daily at 12 noon. VisiUltra     OVER THE COUNTER MEDICATION Take 1 capsule by mouth daily at 12 noon. blood boost supplement for glucose control     Probiotic Product (PROBIOTIC PO) Take 2 capsules by mouth daily at 12 noon.     QUERCETIN PO Take 1 tablet by mouth daily at 12 noon.     rosuvastatin  (CRESTOR ) 40 MG tablet Take 1 tablet (40 mg total) by mouth daily. 30 tablet 11    SELENIUM PO Take 1 tablet by mouth daily  at 12 noon.     spironolactone  (ALDACTONE ) 25 MG tablet Take 0.5 tablets (12.5 mg total) by mouth daily. 90 tablet 0     Assessment: 78 y.o. male presenting with CP, hx of CAD s/p CABG, he is on Eliquis  PTA for afib, heparin  started on admission  Initial aPTT subtherapeutic on 750 units/hr, anti-Xa level elevated as influenced from last Eliquis  dose, will continue to follow aPTTs  Goal of Therapy:  aPTT 66-102 sec Heparin  level 0.3-0.7 units/ml Monitor platelets by anticoagulation protocol: Yes   Plan:  Increase heparin  gtt to 900 units/hr F/u 8 hour aPTT/HL F/u cards plan and ability to transition back to Eliquis   Dorn Poot, PharmD, [facility] Clinical Pharmacist ED Pharmacist Phone # [phone] 02/25/2024 11:25 AM

## 2024-02-25 NOTE — Consult Note (Signed)
 Cardiology Consultation   Patient ID: Luke Harvey MRN: [id number]; DOB: Jul 13, 1945  Admit date: 02/24/2024 Date of Consult: 02/25/2024  PCP:  Clinic, [facility] Providers Cardiologist:  Lonni LITTIE Nanas, MD  Structural Heart:  [name], MD      Patient Profile: Luke Harvey is a 78 y.o. male with a hx of coronary artery disease s/p 2v CABG (LIMA to LAD, , aortic stenosis s/p TAVR in 11/04/2023, TDM-II, pAF, CKD, hyperlipidemia, and paroxysmal SVT who is being seen 02/25/2024 for the evaluation of chest pain at the request of the Emergency Department.  History of Present Illness: Luke Harvey started having chest pain a week ago. The chest pain did not worsen throughout the week. The pain is intermittent, relieved with SL tablets. He took 2-3 tablets though out the week prior to his visit. Today, he took an additional 3 SL tablets prior to his ED visit. He saw his Doctor in clinic today for a CT scan, and told his provider about his chest pain. He was instructed to go the ER. Denies palpitations, syncope.   He has a history of CAD and has had multiple stents as well as 2 vessel CABG in 2014. He was hospitalized from 6/29-10/26/2023 for sob and chest pain 2/2 type I NSTEMI. LHC on 6/30 showed severe obstructive disease of his native arteries. His LIMA to LAD was patent. He was readmitted in July for shortness of breath. He underwent a TAVR by Dr. Fell on 7/11 for low flow AS.  TTE on 12/22/2023 showed EF 50-55%, normal RVF, TAVR MG 5, DI 0.46. Since he was discharged, he has been mostly adherent with his medications. He's felt significantly better since these procedures, until these recent episodes.    Past Medical History:  Diagnosis Date   Age-related macular degeneration, dry, both eyes    Anxiety    Aortic stenosis    Arthritis    probably all over (06/08/2016)   Cardiomyopathy (HCC)    a. EF 40-45% in 2018, normalized in 03/2020.    Chronic back pain    started in my lower back; going up my back in the last couple months (06/08/2016)   Chronic sinusitis    Coronary artery disease    a. s/p CABGx 2V (2014 in [place])  b. PCI (2015 in [place]). c. PCI 2018 ([facility]). d. PCI 03/2020 (complex - [facility]).   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 06/09/2016   NSTEMI (non-ST elevated myocardial infarction) (HCC) 06/08/2016   Pneumonia    3-4 times maybe (06/08/2016)   PVD (peripheral vascular disease)    Recovering alcoholic (HCC)    picked up my 30 year chip the other day (06/08/2016)   S/P angioplasty with stent, 06/08/16 DES, for in-stent restenosis in RCA. 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 Fell and Dr. Shyrl.   Sleep apnea    got a mask after OR;  don't use it (06/08/2016)   Type II diabetes mellitus [facility])     Past Surgical History:  Procedure Laterality Date   ANTERIOR CERVICAL DECOMP/DISCECTOMY FUSION     BACK SURGERY     CARDIAC CATHETERIZATION  03/2012   led to bypass   CAROTID ENDARTERECTOMY Right ~ 2014   COLONOSCOPY W/ BIOPSIES AND POLYPECTOMY     CORONARY ANGIOPLASTY WITH STENT PLACEMENT  2015; 06/08/2016   CORONARY ARTERY BYPASS GRAFT  04/10/2012   CORONARY ATHERECTOMY N/A 04/04/2020   Procedure: CORONARY ATHERECTOMY;  Surgeon: Burnard Debby LABOR, MD;  Location: [facility] INVASIVE CV LAB;  Service: Cardiovascular;  Laterality: N/A;   CORONARY STENT INTERVENTION N/A 06/08/2016   Procedure: Coronary Stent Intervention;  Surgeon: Debby LABOR Burnard, MD;  Location: [facility] INVASIVE CV LAB;  Service: Cardiovascular;  Laterality: N/A;   CORONARY STENT INTERVENTION N/A 04/02/2020   Procedure: CORONARY STENT INTERVENTION;  Surgeon: Anner Alm ORN, MD;  Location: [facility] INVASIVE CV LAB;  Service: Cardiovascular;  Laterality: N/A;   CORONARY STENT INTERVENTION N/A 04/04/2020   Procedure: CORONARY STENT INTERVENTION;  Surgeon: Burnard Debby LABOR, MD;  Location: [facility];  Service: Cardiovascular;  Laterality: N/A;   CORONARY STENT INTERVENTION N/A 11/16/2021   Procedure: CORONARY STENT INTERVENTION;  Surgeon: Mady Bruckner, MD;  Location: [facility];  Service: Cardiovascular;  Laterality: N/A;   CORONARY STENT INTERVENTION N/A 10/24/2023   Procedure: CORONARY STENT INTERVENTION;  Surgeon: Anner Alm ORN, MD;  Location: [facility] INVASIVE CV LAB;  Service: Cardiovascular;  Laterality: N/A;   CORONARY ULTRASOUND/IVUS N/A 04/04/2020   Procedure: Intravascular Ultrasound/IVUS;  Surgeon: Burnard Debby LABOR, MD;  Location: [facility] INVASIVE CV LAB;  Service: Cardiovascular;  Laterality: N/A;   INTRAOPERATIVE TRANSTHORACIC ECHOCARDIOGRAM N/A 11/04/2023   Procedure: ECHOCARDIOGRAM, TRANSTHORACIC;  Surgeon: Wonda Sharper, MD;  Location: [facility] INVASIVE CV LAB;  Service: Cardiovascular;  Laterality: N/A;   IR THORACENTESIS ASP PLEURAL SPACE W/IMG GUIDE  11/02/2023   JOINT REPLACEMENT     LEFT HEART CATH AND CORONARY ANGIOGRAPHY N/A 06/08/2016   Procedure: Left Heart Cath and Coronary Angiography;  Surgeon: Debby LABOR Burnard, MD;  Location: [facility];  Service: Cardiovascular;  Laterality: N/A;   LEFT HEART CATH AND CORS/GRAFTS ANGIOGRAPHY N/A 04/02/2020   Procedure: LEFT HEART CATH AND CORS/GRAFTS ANGIOGRAPHY;  Surgeon: Anner Alm ORN, MD;  Location: [facility] INVASIVE CV LAB;  Service: Cardiovascular;  Laterality: N/A;   LEFT HEART CATH AND CORS/GRAFTS ANGIOGRAPHY N/A 11/16/2021   Procedure: LEFT HEART CATH AND CORS/GRAFTS ANGIOGRAPHY;  Surgeon: Mady Bruckner, MD;  Location: [facility] CV LAB;  Service: Cardiovascular;  Laterality: N/A;   REVISION TOTAL HIP ARTHROPLASTY Left ~ 2004   RIGHT/LEFT HEART CATH AND CORONARY ANGIOGRAPHY N/A 10/24/2023   Procedure: RIGHT/LEFT HEART CATH AND CORONARY ANGIOGRAPHY;  Surgeon: Anner Alm ORN, MD;  Location: [facility] INVASIVE CV LAB;  Service: Cardiovascular;  Laterality: N/A;   TEMPORARY PACEMAKER N/A 04/04/2020   Procedure: TEMPORARY PACEMAKER;  Surgeon: Burnard Debby LABOR, MD;  Location: [facility] INVASIVE CV LAB;  Service: Cardiovascular;  Laterality: N/A;   TONSILLECTOMY     TOTAL HIP ARTHROPLASTY Left 1990s     Home Medications:  Prior to Admission medications   Medication Sig Start Date End Date Taking? Authorizing Provider  acetaminophen  (TYLENOL ) 500 MG tablet Take 500 mg by mouth 2 (two) times daily.    [provider]  amoxicillin  (AMOXIL ) 500 MG tablet Take 4 tablets (2,000 mg total) by mouth as directed. 1 hour prior  to dental work including cleanings 12/22/23   Sebastian Lamarr SAUNDERS, PA-C  apixaban  (ELIQUIS ) 2.5 MG TABS tablet Take 1 tablet (2.5 mg total) by mouth 2 (two) times daily. 11/08/23   Henry Shaver B, NP  BIOTIN PO Take 1 tablet by mouth daily at 12 noon.    [provider]  carboxymethylcellulose (REFRESH PLUS) 0.5 % SOLN Place 1 drop into both eyes See admin instructions. Administer 1 drop into each eye every daily. May use every 12 hours if needed for dry eyes.    [provider]  Cholecalciferol (VITAMIN D-3 PO) Take 1 tablet by mouth daily at 12 noon.    [provider]  clopidogrel  (PLAVIX ) 75 MG tablet Take 1 tablet (75 mg total) by mouth daily. 11/09/23   Henry Shaver NOVAK, NP  Coenzyme Q10 (COQ10 PO) Take 1 capsule by mouth daily at 12 noon.    [provider]  Cyanocobalamin (VITAMIN B12 PO) Take 1 tablet by mouth daily at 12 noon.    [provider]  diclofenac Sodium (VOLTAREN) 1 % GEL 1 Application every 6 (six) hours as  needed for pain. 02/02/22   [provider]  DULoxetine  (CYMBALTA ) 30 MG capsule Take 30 mg by mouth at bedtime.    [provider]  empagliflozin  (JARDIANCE ) 10 MG TABS tablet Take 1 tablet (10 mg total) by mouth daily. 10/27/23   Amin, Ankit C, MD  fluticasone  (FLONASE ) 50 MCG/ACT nasal spray Place 1-2 sprays into both nostrils daily as needed for allergies or rhinitis.    [provider]  ketoconazole (NIZORAL) 2 % cream Apply 1 Application topically 2 (two) times daily as needed for irritation. 02/23/21   [provider]  ketoconazole (NIZORAL) 2 % shampoo Apply 1 Application topically every 3 (three) days.    [provider]  levOCARNitine (L-CARNITINE PO) Take 2 tablets by mouth daily at 12 noon.    [provider]  levothyroxine  (SYNTHROID ) 75 MCG tablet Take 75 mcg by mouth daily before breakfast. 02/02/22   [provider]  lidocaine  (LIDODERM ) 5 % Place 1 patch onto the skin daily as needed (pain).    [provider]  loratadine  (CLARITIN ) 10 MG tablet Take 10 mg by mouth daily at 12 noon.    [provider]  MAGNESIUM  PO Take 2 tablets by mouth daily at 12 noon.    [provider]  metFORMIN  (GLUCOPHAGE ) 500 MG tablet Take 1,000 mg by mouth 2 (two) times daily as needed (BS > 140). Patient taking differently: Take 1,000 mg by mouth 2 (two) times daily as needed (BS > 140). Only taking in the morning 05/04/06   [provider]  metoprolol  succinate (TOPROL -XL) 25 MG 24 hr tablet Take 1 tablet (25 mg total) by mouth at bedtime. 11/08/23   Henry Shaver B, NP  MILK THISTLE PO Take 1 tablet by mouth daily at 12 noon.    [provider]  Multiple Vitamins-Minerals (PRESERVISION AREDS 2) CAPS Take 1 capsule by mouth 2 (two) times daily.    [provider]  nitroGLYCERIN  (NITROSTAT ) 0.4 MG SL tablet Place 1 tablet (0.4 mg total) under the tongue See admin instructions. Take 1 tablet  (0.4) mg by mouth every day. May take an additional 1 tablet later in the day if  chest pain. 10/26/23   Amin, Ankit C, MD  OVER THE COUNTER MEDICATION Take 1 capsule by mouth daily at 12 noon. VisiUltra    [provider]  OVER THE COUNTER MEDICATION Take 1 capsule by mouth daily at 12 noon. blood boost supplement for glucose control    [provider]  Probiotic Product (PROBIOTIC PO) Take 2 capsules by mouth daily at 12 noon.    [provider]  QUERCETIN PO Take 1 tablet by mouth daily at 12 noon.    [provider]  rosuvastatin  (CRESTOR ) 40 MG tablet Take 1 tablet (40 mg total) by mouth daily. 04/05/20   Dunn, Dayna N, PA-C  SELENIUM PO Take 1 tablet by mouth daily at 12 noon.    [provider]  spironolactone  (ALDACTONE ) 25 MG tablet Take 0.5 tablets (12.5 mg total) by mouth daily. 11/08/23   Henry Manuelita NOVAK, NP    Scheduled Meds:  Continuous Infusions:  heparin      PRN Meds: nitroGLYCERIN   Allergies:    Allergies  Allergen Reactions   Tetracyclines & Related Hives and Itching   Cozaar [Losartan] Cough   Zestril  [Lisinopril ] Cough   Lipitor [Atorvastatin] Other (See Comments)    Myalgias     Social History:   Social History   Socioeconomic History   Marital status: Divorced    Spouse name: Not on file   Number of children: 2   Years of education: Not on file   Highest education level: GED or equivalent  Occupational History   Occupation: Retired  Tobacco Use   Smoking status: Former    Current packs/day: 0.00    Average packs/day: 1 pack/day for 40.0 years (40.0 ttl pk-yrs)    Types: Cigarettes    Start date: 07/17/1968    Quit date: 07/17/2008    Years since quitting: 15.6   Smokeless tobacco: Never  Substance and Sexual Activity   Alcohol  use: No    Comment: recovering alcoholic, abstinence 30 years.  attends AA weekly   Drug use: No   Sexual activity: Never  Other Topics Concern   Not on file  Social  History Narrative   Not on file   Social Drivers of Health   Financial Resource Strain: Not on file  Food Insecurity: No Food Insecurity (10/29/2023)   Hunger Vital Sign    Worried About Running Out of Food in the Last Year: Never true    Ran Out of Food in the Last Year: Never true  Transportation Needs: No Transportation Needs (10/29/2023)   PRAPARE - Administrator, Civil Service (Medical): No    Lack of Transportation (Non-Medical): No  Physical Activity: Not on file  Stress: Not on file  Social Connections: Unknown (10/30/2023)   Social Connection and Isolation Panel    Frequency of Communication with Friends and Family: Never    Frequency of Social Gatherings with Friends and Family: Never    Attends Religious Services: Never    Database Administrator or Organizations: Patient declined    Attends Banker Meetings: Patient declined    Marital Status: Not on file  Intimate Partner Violence: Not At Risk (10/29/2023)   Humiliation, Afraid, Rape, and Kick questionnaire    Fear of Current or Ex-Partner: No    Emotionally Abused: No    Physically Abused: No    Sexually Abused: No    Family History:    Family History  Problem Relation Age of Onset   Cancer Mother      ROS:  Please see the history of present illness.   All other ROS reviewed and negative.     Physical Exam/Data: Vitals:  02/24/24 2259 02/25/24 0000 02/25/24 0015 02/25/24 0045  BP:  (!) 147/84 (!) 150/88 (!) 147/86  Pulse:  (!) 113 (!) 113 (!) 115  Resp:  (!) 26 (!) 26   Temp:      TempSrc:      SpO2: 96% 99% 97% 90%  Weight:      Height:       No intake or output data in the 24 hours ending 02/25/24 0120    02/24/2024    9:49 PM 12/22/2023    1:25 PM 12/07/2023    3:29 PM  Last 3 Weights  Weight (lbs) 136 lb 0.4 oz 136 lb 133 lb 9.6 oz  Weight (kg) 61.7 kg 61.689 kg 60.6 kg     Body mass index is 21.95 kg/m.  General:  Well nourished, well developed, in no acute  distress HEENT: normal Neck: no JVD Vascular: No carotid bruits; Distal pulses 2+ bilaterally Cardiac:  normal S1, S2; RRR; no murmur  Lungs:  clear to auscultation bilaterally, no wheezing, rhonchi or rales  Abd: soft, nontender, no hepatomegaly  Ext: no edema Musculoskeletal:  No deformities, BUE and BLE strength normal and equal Skin: warm and dry  Neuro:  CNs 2-12 intact, no focal abnormalities noted Psych:  Normal affect   EKG:  The EKG was personally reviewed and demonstrates:  sinus rhythm Telemetry:  Telemetry was personally reviewed and demonstrates:  sinus rhythm  Relevant CV Studies:  reviewed  Laboratory Data: High Sensitivity Troponin:   Recent Labs  Lab 02/24/24 2159 02/25/24 0012  TROPONINIHS 167* 157*     Chemistry Recent Labs  Lab 02/24/24 2159  NA 135  K 3.8  CL 99  CO2 23  GLUCOSE 196*  BUN 19  CREATININE 1.38*  CALCIUM  8.9  GFRNONAA 52*  ANIONGAP 13    No results for input(s): PROT, ALBUMIN, AST, ALT, ALKPHOS, BILITOT in the last 168 hours. Lipids No results for input(s): CHOL, TRIG, HDL, LABVLDL, LDLCALC, CHOLHDL in the last 168 hours.  Hematology Recent Labs  Lab 02/24/24 2159  WBC 8.1  RBC 4.17*  HGB 11.3*  HCT 35.7*  MCV 85.6  MCH 27.1  MCHC 31.7  RDW 13.2  PLT 286   Thyroid No results for input(s): TSH, FREET4 in the last 168 hours.  BNPNo results for input(s): BNP, PROBNP in the last 168 hours.  DDimer  Recent Labs  Lab 02/24/24 2159  DDIMER 1.05*    Radiology/Studies:  CT Angio Chest PE W and/or Wo Contrast Result Date: 02/25/2024 EXAM: CTA of the Chest with contrast for PE 02/25/2024 12:31:37 AM TECHNIQUE: CTA of the chest was performed after the administration of intravenous contrast. Multiplanar reformatted images are provided for review. MIP images are provided for review. Automated exposure control, iterative reconstruction, and/or weight based adjustment of the mA/kV was utilized to  reduce the radiation dose to as low as reasonably achievable. COMPARISON: 12/07/2023 CLINICAL HISTORY: Syncope/presyncope, cerebrovascular cause suspected. FINDINGS: PULMONARY ARTERIES: Pulmonary arteries are adequately opacified for evaluation. No pulmonary embolism. Main pulmonary artery is normal in caliber. MEDIASTINUM: The heart and pericardium demonstrate no acute abnormality. Aortic atherosclerosis. There is no acute abnormality of the thoracic aorta. LYMPH NODES: No mediastinal, hilar or axillary lymphadenopathy. LUNGS AND PLEURA: The lungs are without acute process. No focal consolidation or pulmonary edema. Previously seen bilateral effusions have resolved. Scarring in the lower lobes stable. No pneumothorax. UPPER ABDOMEN: Limited images of the upper abdomen are unremarkable. SOFT TISSUES AND BONES: No acute bone  or soft tissue abnormality. IMPRESSION: 1. No pulmonary embolism. 2. Bibasilar scarring. 3. Aortic atherosclerosis. 4. No acute findings. Electronically signed by: Franky Crease MD 02/25/2024 12:37 AM EDT RP Workstation: [id number]   DG Chest 2 View Result Date: 02/24/2024 CLINICAL DATA:  Chest pain for 1 week EXAM: CHEST - 2 VIEW COMPARISON:  12/07/2023 FINDINGS: Frontal and lateral views of the chest demonstrate postsurgical changes from median sternotomy and aortic valve prosthesis. Cardiac silhouette is unremarkable. Increased prominence of the pulmonary interstitium consistent with developing interstitial edema and mild volume overload. No effusions or pneumothorax. No acute bony abnormalities. IMPRESSION: 1. Increased interstitial prominence compatible with developing interstitial edema. Electronically Signed   By: Ozell Daring M.D.   On: 02/24/2024 22:29   XR HIP UNILAT W OR W/O PELVIS 2-3 VIEWS LEFT Result Date: 02/21/2024 X-rays of the left hip show a left total hip arthroplasty.  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   The femoral head is eccentrically positioned within the acetabular component.  XR Lumbar Spine 2-3 Views Result Date: 02/21/2024 X-rays of the lumbar spine show age-appropriate degenerative changes.    Assessment and Plan:  NASHTON BELSON is a 78 y.o. male with a hx of coronary artery disease s/p 2v CABG (LIMA to LAD, , aortic stenosis s/p TAVR in 11/04/2023, TDM-II, pAF, CKD, hyperlipidemia, and paroxysmal SVT who is being seen 02/25/2024 for the evaluation of chest pain at the request of the Emergency Department.  Patients chest pain is likely cardiac in nature. He has evidence of acute myocardial injury. Differential includes type II in the setting of anemia and known CAD. Symptoms less likely related to arrhythmia. He had prior stents placed in one of his grafts. Will start heparin  and determine need for repeat catheterization  IV heparin  Continue plavix  Iron studies   Risk Assessment/Risk Scores:    TIMI Risk Score for Unstable Angina or Non-ST Elevation MI:   The patient's TIMI risk score is  , which indicates a  % risk of all cause mortality, new or recurrent myocardial infarction or need for urgent revascularization in the next 14 days.         For questions or updates, please contact [facility] Please consult [email or web address] for contact info under      Signed, Tatumn Corbridge A Maleeyah Mccaughey, MD  02/25/2024 1:20 AM

## 2024-02-25 NOTE — Progress Notes (Signed)
 Patient stated that at home he has chest pain frequently and takes nitroglycerin  daily. Reports that nitroglycerin  usually helps relieve chest pain for a while, and then it comes back.   He had an episode of chest pain, nitroglycerin  was admin and it has resolved.  Patient is refusing bed alarm safety measure.

## 2024-02-25 NOTE — Progress Notes (Signed)
 1 PROGRESS NOTE    Luke Harvey  FMW:[id number] DOB: 04-Nov-1945 DOA: 02/24/2024 PCP: [facility]  Outpatient Specialists:     Brief Narrative:  Patient is an 78 year old male with past medical history significant for coronary artery disease s/p 2v CABG (LIMA to LAD, , aortic stenosis s/p TAVR in 11/04/2023, TDM-II, pAF, CKD, hyperlipidemia, and paroxysmal SVT.  Patient was admitted with chest pain.  Elevated troponin (167-157).  CTA chest was negative for pulmonary embolism.  EKG revealed probable LVH with repolarization.  Patient is on heparin  drip.  Cardiology team is assisting in directing patient's care.  02/25/2024: Patient seen.  No chest pain at the moment.  Assessment & Plan:   Principal Problem:   Unstable angina (HCC)   Chest pain/unstable angina: - History of two-vessel CABG. - Pain-free. - Patient is on heparin  drip. - Continue Plavix , Toprol  XL, Crestor  and Aldactone .  Hypothyroidism: - Continue levothyroxine .  Depression: - Continue Cymbalta .  Chronic kidney disease stage IIIa: - Stable.  DVT prophylaxis: Patient is on heparin  drip. Code Status: Full code Family Communication:  Disposition Plan: This will depend on hospital course   Consultants:  Cardiology  Procedures:  None  Antimicrobials:  None   Subjective: No chest pain at the moment. No shortness of breath  Objective: Vitals:   02/25/24 0549 02/25/24 0730 02/25/24 0952 02/25/24 1000  BP:  127/79  130/77  Pulse:  (!) 105  100  Resp:  (!) 24  18  Temp: 98.9 F (37.2 C)  98.6 F (37 C)   TempSrc: Oral  Oral   SpO2:  96%  98%  Weight:      Height:       No intake or output data in the 24 hours ending 02/25/24 1201 Filed Weights   02/24/24 2149  Weight: 61.7 kg    Examination:  General exam: Appears calm and comfortable  Respiratory system: Clear to auscultation.  Cardiovascular system: S1 & S2 heard Gastrointestinal system: Abdomen is soft and nontender.   Central nervous system: Alert and oriented.  Extremities: No leg edema.  Data Reviewed: I have personally reviewed following labs and imaging studies  CBC: Recent Labs  Lab 02/24/24 2159 02/25/24 0435  WBC 8.1 8.5  HGB 11.3* 10.8*  HCT 35.7* 33.5*  MCV 85.6 84.0  PLT 286 259   Basic Metabolic Panel: Recent Labs  Lab 02/24/24 2159 02/25/24 0435  NA 135 134*  K 3.8 3.9  CL 99 99  CO2 23 20*  GLUCOSE 196* 169*  BUN 19 20  CREATININE 1.38* 1.28*  CALCIUM  8.9 8.7*   GFR: Estimated Creatinine Clearance: 40.2 mL/min (A) (by C-G formula based on SCr of 1.28 mg/dL (H)). Liver Function Tests: No results for input(s): AST, ALT, ALKPHOS, BILITOT, PROT, ALBUMIN in the last 168 hours. No results for input(s): LIPASE, AMYLASE in the last 168 hours. No results for input(s): AMMONIA in the last 168 hours. Coagulation Profile: Recent Labs  Lab 02/25/24 0435  INR 1.3*   Cardiac Enzymes: No results for input(s): CKTOTAL, CKMB, CKMBINDEX, TROPONINI in the last 168 hours. BNP (last 3 results) No results for input(s): PROBNP in the last 8760 hours. HbA1C: No results for input(s): HGBA1C in the last 72 hours. CBG: No results for input(s): GLUCAP in the last 168 hours. Lipid Profile: No results for input(s): CHOL, HDL, LDLCALC, TRIG, CHOLHDL, LDLDIRECT in the last 72 hours. Thyroid Function Tests: No results for input(s): TSH, T4TOTAL, FREET4, T3FREE, THYROIDAB in the last 72  hours. Anemia Panel: Recent Labs    02/25/24 0238  TIBC 259  IRON 21*   Urine analysis:    Component Value Date/Time   COLORURINE YELLOW 02/24/2024 2203   APPEARANCEUR CLEAR 02/24/2024 2203   LABSPEC 1.029 02/24/2024 2203   PHURINE 5.0 02/24/2024 2203   GLUCOSEU >=500 (A) 02/24/2024 2203   HGBUR NEGATIVE 02/24/2024 2203   BILIRUBINUR NEGATIVE 02/24/2024 2203   KETONESUR NEGATIVE 02/24/2024 2203   PROTEINUR NEGATIVE 02/24/2024 2203   NITRITE  NEGATIVE 02/24/2024 2203   LEUKOCYTESUR NEGATIVE 02/24/2024 2203   Sepsis Labs: @LABRCNTIP (procalcitonin:4,lacticidven:4)  )No results found for this or any previous visit (from the past 240 hours).       Radiology Studies: CT Angio Chest PE W and/or Wo Contrast Result Date: 02/25/2024 EXAM: CTA of the Chest with contrast for PE 02/25/2024 12:31:37 AM TECHNIQUE: CTA of the chest was performed after the administration of intravenous contrast. Multiplanar reformatted images are provided for review. MIP images are provided for review. Automated exposure control, iterative reconstruction, and/or weight based adjustment of the mA/kV was utilized to reduce the radiation dose to as low as reasonably achievable. COMPARISON: 12/07/2023 CLINICAL HISTORY: Syncope/presyncope, cerebrovascular cause suspected. FINDINGS: PULMONARY ARTERIES: Pulmonary arteries are adequately opacified for evaluation. No pulmonary embolism. Main pulmonary artery is normal in caliber. MEDIASTINUM: The heart and pericardium demonstrate no acute abnormality. Aortic atherosclerosis. There is no acute abnormality of the thoracic aorta. LYMPH NODES: No mediastinal, hilar or axillary lymphadenopathy. LUNGS AND PLEURA: The lungs are without acute process. No focal consolidation or pulmonary edema. Previously seen bilateral effusions have resolved. Scarring in the lower lobes stable. No pneumothorax. UPPER ABDOMEN: Limited images of the upper abdomen are unremarkable. SOFT TISSUES AND BONES: No acute bone or soft tissue abnormality. IMPRESSION: 1. No pulmonary embolism. 2. Bibasilar scarring. 3. Aortic atherosclerosis. 4. No acute findings. Electronically signed by: Franky Crease MD 02/25/2024 12:37 AM EDT RP Workstation: [id number]   DG Chest 2 View Result Date: 02/24/2024 CLINICAL DATA:  Chest pain for 1 week EXAM: CHEST - 2 VIEW COMPARISON:  12/07/2023 FINDINGS: Frontal and lateral views of the chest demonstrate postsurgical changes from  median sternotomy and aortic valve prosthesis. Cardiac silhouette is unremarkable. Increased prominence of the pulmonary interstitium consistent with developing interstitial edema and mild volume overload. No effusions or pneumothorax. No acute bony abnormalities. IMPRESSION: 1. Increased interstitial prominence compatible with developing interstitial edema. Electronically Signed   By: Ozell Daring M.D.   On: 02/24/2024 22:29        Scheduled Meds:  clopidogrel   75 mg Oral Daily   DULoxetine   30 mg Oral QHS   levothyroxine   75 mcg Oral QAC breakfast   metoprolol  succinate  25 mg Oral QHS   rosuvastatin   40 mg Oral Daily   spironolactone   12.5 mg Oral Daily   Continuous Infusions:  heparin  750 Units/hr (02/25/24 0143)     LOS: 0 days    Time spent: 55 minutes    Leatrice Chapel, MD  [facility] Pager #: [phone] 7PM-7AM contact night coverage as above

## 2024-02-25 NOTE — Plan of Care (Signed)

## 2024-02-25 NOTE — Progress Notes (Signed)
 PHARMACY - ANTICOAGULATION CONSULT NOTE  Pharmacy Consult for heparin  Indication: chest pain/ACS  Allergies  Allergen Reactions   Tetracyclines & Related Hives and Itching   Cozaar [Losartan] Cough   Zestril  [Lisinopril ] Cough   Lipitor [Atorvastatin] Other (See Comments)    Myalgias     Patient Measurements: Height: 5' 6 (167.6 cm) Weight: 61.7 kg (136 lb 0.4 oz) IBW/kg (Calculated) : 63.8 HEPARIN  DW (KG): 61.7  Vital Signs: Temp: 97.9 F (36.6 C) (10/31 2139) Temp Source: Oral (10/31 2139) BP: 147/86 (11/01 0045) Pulse Rate: 115 (11/01 0045)  Labs: Recent Labs    02/24/24 2159 02/25/24 0012  HGB 11.3*  --   HCT 35.7*  --   PLT 286  --   CREATININE 1.38*  --   TROPONINIHS 167* 157*    Estimated Creatinine Clearance: 37.3 mL/min (A) (by C-G formula based on SCr of 1.38 mg/dL (H)).   Medical History: Past Medical History:  Diagnosis Date   Age-related macular degeneration, dry, both eyes    Anxiety    Aortic stenosis    Arthritis    probably all over (06/08/2016)   Cardiomyopathy (HCC)    a. EF 40-45% in 2018, normalized in 03/2020.   Chronic back pain    started in my lower back; going up my back in the last couple months (06/08/2016)   Chronic sinusitis    Coronary artery disease    a. s/p CABGx 2V (2014 in [place])  b. PCI (2015 in [place]). c. PCI 2018 ([facility]). d. PCI 03/2020 (complex - [facility]).   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 06/09/2016   NSTEMI (non-ST elevated myocardial infarction) (HCC) 06/08/2016   Pneumonia    3-4 times maybe (06/08/2016)   PVD (peripheral vascular  disease)    Recovering alcoholic (HCC)    picked up my 30 year chip the other day (06/08/2016)   S/P angioplasty with stent, 06/08/16 DES, for in-stent restenosis in RCA. 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 Wonda and Dr. Shyrl.   Sleep apnea    got a mask after OR; don't use it (06/08/2016)   Type II diabetes mellitus (HCC)     Medications:  No current facility-administered medications on file prior to encounter.   Current Outpatient Medications on File Prior to Encounter  Medication Sig Dispense Refill   acetaminophen  (TYLENOL ) 500 MG tablet Take 500 mg by mouth 2 (two) times daily.     amoxicillin  (AMOXIL ) 500 MG tablet Take 4 tablets (2,000 mg total) by mouth as directed. 1 hour prior to dental work including cleanings 12 tablet 12   apixaban  (ELIQUIS ) 2.5 MG TABS tablet Take 1 tablet (2.5 mg total) by mouth 2 (two) times daily. 60 tablet 2   BIOTIN PO Take 1 tablet by mouth daily at 12 noon.     carboxymethylcellulose (REFRESH PLUS) 0.5 % SOLN Place 1 drop into both eyes See admin instructions. Administer 1 drop into  each eye every daily. May use every 12 hours if needed for dry eyes.     Cholecalciferol (VITAMIN D-3 PO) Take 1 tablet by mouth daily at 12 noon.     clopidogrel  (PLAVIX ) 75 MG tablet Take 1 tablet (75 mg total) by mouth daily. 90 tablet 2   Coenzyme Q10 (COQ10 PO) Take 1 capsule by mouth daily at 12 noon.     Cyanocobalamin (VITAMIN B12 PO) Take 1 tablet by mouth daily at 12 noon.     diclofenac Sodium (VOLTAREN) 1 % GEL 1 Application every 6 (six) hours as needed for pain.     DULoxetine  (CYMBALTA ) 30 MG capsule Take 30 mg by mouth at bedtime.     empagliflozin  (JARDIANCE ) 10 MG TABS tablet Take 1 tablet (10 mg total) by mouth daily.     fluticasone  (FLONASE ) 50 MCG/ACT nasal spray Place 1-2 sprays into both nostrils daily as needed for allergies or rhinitis.      ketoconazole (NIZORAL) 2 % cream Apply 1 Application topically 2 (two) times daily as needed for irritation.     ketoconazole (NIZORAL) 2 % shampoo Apply 1 Application topically every 3 (three) days.     levOCARNitine (L-CARNITINE PO) Take 2 tablets by mouth daily at 12 noon.     levothyroxine  (SYNTHROID ) 75 MCG tablet Take 75 mcg by mouth daily before breakfast.     lidocaine  (LIDODERM ) 5 % Place 1 patch onto the skin daily as needed (pain).     loratadine  (CLARITIN ) 10 MG tablet Take 10 mg by mouth daily at 12 noon.     MAGNESIUM  PO Take 2 tablets by mouth daily at 12 noon.     metFORMIN  (GLUCOPHAGE ) 500 MG tablet Take 1,000 mg by mouth 2 (two) times daily as needed (BS > 140). (Patient taking differently: Take 1,000 mg by mouth 2 (two) times daily as needed (BS > 140). Only taking in the morning)     metoprolol  succinate (TOPROL -XL) 25 MG 24 hr tablet Take 1 tablet (25 mg total) by mouth at bedtime. 90 tablet 1   MILK THISTLE PO Take 1 tablet by mouth daily at 12 noon.     Multiple Vitamins-Minerals (PRESERVISION AREDS 2) CAPS Take 1 capsule by mouth 2 (two) times daily.     nitroGLYCERIN  (NITROSTAT ) 0.4 MG SL tablet Place 1 tablet (0.4 mg total) under the tongue See admin instructions. Take 1 tablet (0.4) mg by mouth every day. May take an additional 1 tablet later in the day if  chest pain.     OVER THE COUNTER MEDICATION Take 1 capsule by mouth daily at 12 noon. VisiUltra     OVER THE COUNTER MEDICATION Take 1 capsule by mouth daily at 12 noon. blood boost supplement for glucose control     Probiotic Product (PROBIOTIC PO) Take 2 capsules by mouth daily at 12 noon.     QUERCETIN PO Take 1 tablet by mouth daily at 12 noon.     rosuvastatin  (CRESTOR ) 40 MG tablet Take 1 tablet (40 mg total) by mouth daily. 30 tablet 11   SELENIUM PO Take 1 tablet by mouth daily at 12 noon.     spironolactone  (ALDACTONE ) 25 MG tablet Take 0.5 tablets (12.5 mg total) by mouth daily. 90 tablet 0      Assessment: 78 y.o. male with h/o Afib, Eliquis  on hold, for heparin    Last dose of Eliquis  am 10/31  Goal of Therapy:  aPTT 66-102 sec Heparin  level 0.3-0.7 units/ml Monitor platelets  by anticoagulation protocol: Yes   Plan:  Start heparin  750 units/hr Check aPTT in 8 hours   Stevi Hollinshead, Cordella Misty 02/25/2024,1:10 AM

## 2024-02-25 NOTE — ED Notes (Signed)
 CCMD called.

## 2024-02-25 NOTE — Progress Notes (Signed)
 PHARMACY - ANTICOAGULATION CONSULT NOTE  Pharmacy Consult for heparin  Indication: chest pain/ACS  Allergies  Allergen Reactions   Tetracyclines & Related Hives and Itching   Cozaar [Losartan] Cough   Zestril  [Lisinopril ] Cough   Lipitor [Atorvastatin] Other (See Comments)    Myalgias     Patient Measurements: Height: 5' 6 (167.6 cm) Weight: 61.7 kg (136 lb 0.4 oz) IBW/kg (Calculated) : 63.8 HEPARIN  DW (KG): 61.7  Vital Signs: Temp: 98.2 F (36.8 C) (11/01 1944) Temp Source: Oral (11/01 1944) BP: 145/78 (11/01 1944) Pulse Rate: 119 (11/01 1944)  Labs: Recent Labs    02/24/24 2159 02/25/24 0012 02/25/24 0435 02/25/24 1000 02/25/24 2040  HGB 11.3*  --  10.8*  --   --   HCT 35.7*  --  33.5*  --   --   PLT 286  --  259  --   --   APTT  --   --   --  53* 64*  LABPROT  --   --  16.7*  --   --   INR  --   --  1.3*  --   --   HEPARINUNFRC  --   --   --  0.82* 0.57  CREATININE 1.38*  --  1.28*  --   --   TROPONINIHS 167* 157*  --   --   --     Estimated Creatinine Clearance: 40.2 mL/min (A) (by C-G formula based on SCr of 1.28 mg/dL (H)).   Medical History: Past Medical History:  Diagnosis Date   Age-related macular degeneration, dry, both eyes    Anxiety    Aortic stenosis    Arthritis    probably all over (06/08/2016)   Cardiomyopathy (HCC)    a. EF 40-45% in 2018, normalized in 03/2020.   Chronic back pain    started in my lower back; going up my back in the last couple months (06/08/2016)   Chronic sinusitis    Coronary artery disease    a. s/p CABGx 2V (2014 in [place])  b. PCI (2015 in [place]). c. PCI 2018 ([facility]). d. PCI 03/2020 (complex - [facility]).   Diabetic peripheral neuropathy (HCC)    DVT (deep venous thrombosis) (HCC)    left groin; it was there when I had bypass OR; get it checked q yr; still there now (06/08/2016)   GERD (gastroesophageal reflux disease)    Hepatitis B    History of bleeding ulcers    History of diverticulitis     History of hiatal hernia    HLD (hyperlipidemia)    HTN (hypertension)    LV dysfunction, post MI EF 40-45%. 06/09/2016   NSTEMI (non-ST elevated myocardial infarction) (HCC) 06/08/2016   Pneumonia    3-4 times maybe (06/08/2016)   PVD (peripheral vascular disease)    Recovering alcoholic (HCC)    picked up my 30 year chip the other day (06/08/2016)   S/P angioplasty with stent, 06/08/16 DES, for in-stent restenosis in RCA. 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 Wonda and Dr. Shyrl.   Sleep apnea    got a mask after OR; don't use it (06/08/2016)   Type II diabetes mellitus (HCC)     Medications:  No current facility-administered medications on file prior to encounter.   Current Outpatient Medications on File Prior to Encounter  Medication Sig Dispense Refill   acetaminophen  (TYLENOL ) 500 MG  tablet Take 500 mg by mouth 2 (two) times daily.     amoxicillin  (AMOXIL ) 500 MG tablet Take 4 tablets (2,000 mg total) by mouth as directed. 1 hour prior to dental work including cleanings 12 tablet 12   apixaban  (ELIQUIS ) 2.5 MG TABS tablet Take 1 tablet (2.5 mg total) by mouth 2 (two) times daily. 60 tablet 2   BIOTIN PO Take 1 tablet by mouth daily at 12 noon.     capsaicin (ZOSTRIX) 0.025 % cream Apply 1 Application topically in the morning, at noon, and at bedtime.     carboxymethylcellulose (REFRESH PLUS) 0.5 % SOLN Place 1 drop into both eyes See admin instructions. Administer 1 drop into each eye every daily. May use every 12 hours if needed for dry eyes.     Cholecalciferol (VITAMIN D-3 PO) Take 1 tablet by mouth daily at 12 noon.     clopidogrel  (PLAVIX ) 75 MG tablet Take 1 tablet (75 mg total) by mouth daily. 90 tablet 2   Coenzyme Q10 (COQ10 PO) Take 1 capsule by mouth daily at 12 noon.     Cyanocobalamin (VITAMIN B12 PO) Take 1 tablet by mouth daily at 12 noon.     diclofenac  Sodium (VOLTAREN) 1 % GEL 1 Application every 6 (six) hours as needed for pain.     DULoxetine  (CYMBALTA ) 30 MG capsule Take 30 mg by mouth at bedtime.     empagliflozin  (JARDIANCE ) 10 MG TABS tablet Take 1 tablet (10 mg total) by mouth daily.     fluticasone  (FLONASE ) 50 MCG/ACT nasal spray Place 1-2 sprays into both nostrils daily as needed for allergies or rhinitis.     ketoconazole (NIZORAL) 2 % cream Apply 1 Application topically 2 (two) times daily as needed for irritation.     ketoconazole (NIZORAL) 2 % shampoo Apply 1 Application topically every 3 (three) days.     levOCARNitine (L-CARNITINE PO) Take 2 tablets by mouth daily at 12 noon.     levothyroxine  (SYNTHROID ) 75 MCG tablet Take 75 mcg by mouth daily before breakfast.     lidocaine  (LIDODERM ) 5 % Place 1 patch onto the skin daily as needed (pain).     loratadine  (CLARITIN ) 10 MG tablet Take 10 mg by mouth daily at 12 noon.     MAGNESIUM  PO Take 2 tablets by mouth daily at 12 noon.     metFORMIN  (GLUCOPHAGE ) 500 MG tablet Take 1,000 mg by mouth 2 (two) times daily as needed (BS > 140). (Patient taking differently: Take 1,000 mg by mouth 2 (two) times daily as needed (BS > 140). Only taking in the morning)     methocarbamol (ROBAXIN) 500 MG tablet Take 500 mg by mouth at bedtime.     metoprolol  succinate (TOPROL -XL) 25 MG 24 hr tablet Take 1 tablet (25 mg total) by mouth at bedtime. 90 tablet 1   MILK THISTLE PO Take 1 tablet by mouth daily at 12 noon.     Multiple Vitamins-Minerals (PRESERVISION AREDS 2) CAPS Take 1 capsule by mouth 2 (two) times daily.     nitroGLYCERIN  (NITROSTAT ) 0.4 MG SL tablet Place 1 tablet (0.4 mg total) under the tongue See admin instructions. Take 1 tablet (0.4) mg by mouth every day. May take an additional 1 tablet later in the day if  chest pain.     OVER THE COUNTER MEDICATION Take 1 capsule by mouth daily at 12 noon. VisiUltra     OVER THE COUNTER MEDICATION Take 1 capsule by mouth  daily at 12 noon.  blood boost supplement for glucose control     Probiotic Product (PROBIOTIC PO) Take 2 capsules by mouth daily at 12 noon.     QUERCETIN PO Take 1 tablet by mouth daily at 12 noon.     rosuvastatin  (CRESTOR ) 40 MG tablet Take 1 tablet (40 mg total) by mouth daily. 30 tablet 11   SELENIUM PO Take 1 tablet by mouth daily at 12 noon.     spironolactone  (ALDACTONE ) 25 MG tablet Take 0.5 tablets (12.5 mg total) by mouth daily. 90 tablet 0     Assessment: 78 y.o. male presenting with CP, hx of CAD s/p CABG, he is on Eliquis  PTA for afib, heparin  started on admission  Heparin  level came back therapeutic at 0.57 (likely still being impacted by recent DOAC), aPTT came back subtherapeutic at 64, on 900 units/hr - not yet correlating. No s/sx of bleeding or infusion issues.   Goal of Therapy:  aPTT 66-102 sec Heparin  level 0.3-0.7 units/ml Monitor platelets by anticoagulation protocol: Yes   Plan:  Increase heparin  gtt to 1000 units/hr F/u 8 hour aPTT/HL F/u cards plan and ability to transition back to Eliquis   Thank you for allowing pharmacy to participate in this patient's care,  Suzen Sour, PharmD, BCCCP Clinical Pharmacist  Phone: [phone] 02/25/2024 9:46 PM  Please check AMION for all [facility] phone numbers After 10:00 PM, call Main Pharmacy [phone]

## 2024-02-25 NOTE — H&P (Signed)
 History and Physical    Luke Harvey FMW:[id number] DOB: 1945-08-16 DOA: 02/24/2024  PCP: [facility]   Chief Complaint: cp  HPI: Luke Harvey is a 78 y.o. male with medical history significant of aortic stenosis, CAD status post CABG, DVT on Eliquis , hypertension, hyperlipidemia who presented to the emergency department due to 2 chest pain.  Patient's been having chest pain for last week and has taken nitroglycerin  with intermittent improvement in his symptoms.  Due to refractory and persistent chest pain that was worsening he presented to ER for further assessment.  Of note he has had numerous previous presentations for chest pain and was thought to have NSTEMI.  Had a heart cath in June which showed obstructive disease.  He also underwent a TAVR in July.  In the emergency department he was afebrile and hemodynamically stable.  Labs were obtained which showed creatinine 1.38 at baseline, WBC 8.1, hemoglobin 11.3, troponin 167, 157, urinalysis negative for infection.  CT pulm embolism study was obtained which showed no evidence of PE patient was started heparin  drip cardiology was consulted and recommended further workup.  On evaluation as he endorsed persistent pressure in his chest that is worse with exertion improved with nitroglycerin .   Review of Systems: Review of Systems  Constitutional: Negative.   HENT: Negative.    Eyes: Negative.   Respiratory: Negative.    Cardiovascular:  Positive for chest pain.  Gastrointestinal: Negative.   Genitourinary: Negative.   Musculoskeletal: Negative.   Skin: Negative.   Neurological: Negative.   Endo/Heme/Allergies: Negative.   Psychiatric/Behavioral: Negative.    All other systems reviewed and are negative.    As per HPI otherwise 10 point review of systems negative.   Allergies  Allergen Reactions   Tetracyclines & Related Hives and Itching   Cozaar [Losartan] Cough   Zestril  [Lisinopril ] Cough   Lipitor  [Atorvastatin] Other (See Comments)    Myalgias     Past Medical History:  Diagnosis Date   Age-related macular degeneration, dry, both eyes    Anxiety    Aortic stenosis    Arthritis    probably all over (06/08/2016)   Cardiomyopathy (HCC)    a. EF 40-45% in 2018, normalized in 03/2020.   Chronic back pain    started in my lower back; going up my back in the last couple months (06/08/2016)   Chronic sinusitis    Coronary artery disease    a. s/p CABGx 2V (2014 in [place])  b. PCI (2015 in [place]). c. PCI 2018 ([facility]). d. PCI 03/2020 (complex - [facility]).   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 06/09/2016   NSTEMI (non-ST elevated myocardial infarction) (HCC) 06/08/2016   Pneumonia    3-4 times maybe (06/08/2016)   PVD (peripheral vascular disease)    Recovering alcoholic (HCC)    picked up my 30 year chip the other day (06/08/2016)   S/P angioplasty with stent, 06/08/16 DES, for in-stent restenosis in RCA. 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 Wonda and Dr. Shyrl.   Sleep apnea    got a mask after OR; don't use it (06/08/2016)   Type II diabetes mellitus (HCC)     Past Surgical History:  Procedure Laterality Date   ANTERIOR CERVICAL DECOMP/DISCECTOMY FUSION     BACK SURGERY     CARDIAC CATHETERIZATION  03/2012   led to bypass   CAROTID ENDARTERECTOMY Right ~ 2014   COLONOSCOPY W/ BIOPSIES AND POLYPECTOMY     CORONARY ANGIOPLASTY WITH STENT PLACEMENT  2015; 06/08/2016   CORONARY ARTERY BYPASS GRAFT   04/10/2012   CORONARY ATHERECTOMY N/A 04/04/2020   Procedure: CORONARY ATHERECTOMY;  Surgeon: Burnard Debby LABOR, MD;  Location: [facility];  Service: Cardiovascular;  Laterality: N/A;   CORONARY STENT INTERVENTION N/A 06/08/2016   Procedure: Coronary Stent Intervention;  Surgeon: Debby LABOR Burnard, MD;  Location: [facility];  Service: Cardiovascular;  Laterality: N/A;   CORONARY STENT INTERVENTION N/A 04/02/2020   Procedure: CORONARY STENT INTERVENTION;  Surgeon: Anner Alm ORN, MD;  Location: [facility] INVASIVE CV LAB;  Service: Cardiovascular;  Laterality: N/A;   CORONARY STENT INTERVENTION N/A 04/04/2020   Procedure: CORONARY STENT INTERVENTION;  Surgeon: Burnard Debby LABOR, MD;  Location: [facility];  Service: Cardiovascular;  Laterality: N/A;   CORONARY STENT INTERVENTION N/A 11/16/2021   Procedure: CORONARY STENT INTERVENTION;  Surgeon: Mady Bruckner, MD;  Location: [facility];  Service: Cardiovascular;  Laterality: N/A;   CORONARY STENT INTERVENTION N/A 10/24/2023   Procedure: CORONARY STENT INTERVENTION;  Surgeon: Anner Alm ORN, MD;  Location: [facility] INVASIVE CV LAB;  Service: Cardiovascular;  Laterality: N/A;   CORONARY ULTRASOUND/IVUS N/A 04/04/2020   Procedure: Intravascular Ultrasound/IVUS;  Surgeon: Burnard Debby LABOR, MD;  Location: [facility] INVASIVE CV LAB;  Service: Cardiovascular;  Laterality: N/A;   INTRAOPERATIVE TRANSTHORACIC ECHOCARDIOGRAM N/A 11/04/2023   Procedure: ECHOCARDIOGRAM, TRANSTHORACIC;  Surgeon: Wonda Sharper, MD;  Location: [facility] INVASIVE CV LAB;  Service: Cardiovascular;  Laterality: N/A;   IR THORACENTESIS ASP PLEURAL SPACE W/IMG GUIDE  11/02/2023   JOINT REPLACEMENT     LEFT HEART CATH AND CORONARY ANGIOGRAPHY N/A 06/08/2016   Procedure: Left Heart Cath and Coronary Angiography;  Surgeon: Debby LABOR Burnard, MD;  Location: [facility];  Service: Cardiovascular;  Laterality: N/A;   LEFT HEART CATH AND CORS/GRAFTS ANGIOGRAPHY N/A 04/02/2020    Procedure: LEFT HEART CATH AND CORS/GRAFTS ANGIOGRAPHY;  Surgeon: Anner Alm ORN, MD;  Location: [facility] INVASIVE CV LAB;  Service: Cardiovascular;  Laterality: N/A;   LEFT HEART CATH AND CORS/GRAFTS ANGIOGRAPHY N/A 11/16/2021   Procedure: LEFT HEART CATH AND CORS/GRAFTS ANGIOGRAPHY;  Surgeon: Mady Bruckner, MD;  Location: [facility];  Service: Cardiovascular;  Laterality: N/A;   REVISION TOTAL HIP ARTHROPLASTY Left ~ 2004   RIGHT/LEFT HEART CATH AND CORONARY ANGIOGRAPHY N/A 10/24/2023   Procedure: RIGHT/LEFT HEART CATH AND CORONARY ANGIOGRAPHY;  Surgeon: Anner Alm ORN, MD;  Location: [facility] INVASIVE CV LAB;  Service: Cardiovascular;  Laterality: N/A;   TEMPORARY PACEMAKER N/A 04/04/2020   Procedure: TEMPORARY PACEMAKER;  Surgeon: Burnard Debby LABOR, MD;  Location: [facility] INVASIVE CV LAB;  Service: Cardiovascular;  Laterality: N/A;   TONSILLECTOMY     TOTAL HIP ARTHROPLASTY Left 1990s     reports that he quit smoking about 15 years ago. His smoking use included cigarettes. He started smoking about 55 years ago. He has a 40 pack-year smoking history. He has  never used smokeless tobacco. He reports that he does not drink alcohol  and does not use drugs.  Family History  Problem Relation Age of Onset   Cancer Mother     Prior to Admission medications   Medication Sig Start Date End Date Taking? Authorizing Provider  acetaminophen  (TYLENOL ) 500 MG tablet Take 500 mg by mouth 2 (two) times daily.    [provider]  amoxicillin  (AMOXIL ) 500 MG tablet Take 4 tablets (2,000 mg total) by mouth as directed. 1 hour prior to dental work including cleanings 12/22/23   Sebastian Lamarr SAUNDERS, PA-C  apixaban  (ELIQUIS ) 2.5 MG TABS tablet Take 1 tablet (2.5 mg total) by mouth 2 (two) times daily. 11/08/23   Henry Shaver B, NP  BIOTIN PO Take 1 tablet by mouth daily at 12 noon.    [provider]  carboxymethylcellulose (REFRESH PLUS) 0.5 % SOLN Place 1 drop into both eyes See admin  instructions. Administer 1 drop into each eye every daily. May use every 12 hours if needed for dry eyes.    [provider]  Cholecalciferol (VITAMIN D-3 PO) Take 1 tablet by mouth daily at 12 noon.    [provider]  clopidogrel  (PLAVIX ) 75 MG tablet Take 1 tablet (75 mg total) by mouth daily. 11/09/23   Henry Shaver NOVAK, NP  Coenzyme Q10 (COQ10 PO) Take 1 capsule by mouth daily at 12 noon.    [provider]  Cyanocobalamin (VITAMIN B12 PO) Take 1 tablet by mouth daily at 12 noon.    [provider]  diclofenac Sodium (VOLTAREN) 1 % GEL 1 Application every 6 (six) hours as needed for pain. 02/02/22   [provider]  DULoxetine  (CYMBALTA ) 30 MG capsule Take 30 mg by mouth at bedtime.    [provider]  empagliflozin  (JARDIANCE ) 10 MG TABS tablet Take 1 tablet (10 mg total) by mouth daily. 10/27/23   Amin, Ankit C, MD  fluticasone  (FLONASE ) 50 MCG/ACT nasal spray Place 1-2 sprays into both nostrils daily as needed for allergies or rhinitis.    [provider]  ketoconazole (NIZORAL) 2 % cream Apply 1 Application topically 2 (two) times daily as needed for irritation. 02/23/21   [provider]  ketoconazole (NIZORAL) 2 % shampoo Apply 1 Application topically every 3 (three) days.    [provider]  levOCARNitine (L-CARNITINE PO) Take 2 tablets by mouth daily at 12 noon.    [provider]  levothyroxine  (SYNTHROID ) 75 MCG tablet Take 75 mcg by mouth daily before breakfast. 02/02/22   [provider]  lidocaine  (LIDODERM ) 5 % Place 1 patch onto the skin daily as needed (pain).    [provider]  loratadine  (CLARITIN ) 10 MG tablet Take 10 mg by mouth daily at 12 noon.    [provider]  MAGNESIUM  PO Take 2 tablets by mouth daily at 12 noon.    [provider]  metFORMIN  (GLUCOPHAGE ) 500 MG tablet Take 1,000 mg by mouth 2 (two) times daily as needed (BS > 140). Patient  taking differently: Take 1,000 mg by mouth 2 (two) times daily as needed (BS > 140). Only taking in the morning 05/04/06   [provider]  metoprolol  succinate (TOPROL -XL) 25 MG 24 hr tablet Take 1 tablet (25 mg total) by mouth at bedtime. 11/08/23   Henry Shaver B, NP  MILK THISTLE PO Take 1 tablet by mouth daily at 12 noon.    [provider]  Multiple Vitamins-Minerals (PRESERVISION  AREDS 2) CAPS Take 1 capsule by mouth 2 (two) times daily.    [provider]  nitroGLYCERIN  (NITROSTAT ) 0.4 MG SL tablet Place 1 tablet (0.4 mg total) under the tongue See admin instructions. Take 1 tablet (0.4) mg by mouth every day. May take an additional 1 tablet later in the day if  chest pain. 10/26/23   Amin, Ankit C, MD  OVER THE COUNTER MEDICATION Take 1 capsule by mouth daily at 12 noon. VisiUltra    [provider]  OVER THE COUNTER MEDICATION Take 1 capsule by mouth daily at 12 noon. blood boost supplement for glucose control    [provider]  Probiotic Product (PROBIOTIC PO) Take 2 capsules by mouth daily at 12 noon.    [provider]  QUERCETIN PO Take 1 tablet by mouth daily at 12 noon.    [provider]  rosuvastatin  (CRESTOR ) 40 MG tablet Take 1 tablet (40 mg total) by mouth daily. 04/05/20   Dunn, Dayna N, PA-C  SELENIUM PO Take 1 tablet by mouth daily at 12 noon.    [provider]  spironolactone  (ALDACTONE ) 25 MG tablet Take 0.5 tablets (12.5 mg total) by mouth daily. 11/08/23   Henry Manuelita NOVAK, NP    Physical Exam: Vitals:   02/25/24 0115 02/25/24 0143 02/25/24 0200 02/25/24 0230  BP: (!) 135/91  132/77 137/88  Pulse: (!) 112  (!) 112 (!) 113  Resp: 16  18 (!) 22  Temp:  98 F (36.7 C)    TempSrc:  Oral    SpO2: 93%  96% 94%  Weight:      Height:       Physical Exam Vitals reviewed.  Constitutional:      Appearance: He is normal weight.  HENT:     Head: Normocephalic.  Eyes:     Pupils: Pupils are  equal, round, and reactive to light.  Cardiovascular:     Rate and Rhythm: Normal rate and regular rhythm.     Heart sounds: Normal heart sounds.  Pulmonary:     Effort: Pulmonary effort is normal. No tachypnea.     Breath sounds: Normal breath sounds.  Abdominal:     General: Bowel sounds are normal.     Palpations: Abdomen is soft.  Musculoskeletal:        General: Normal range of motion.     Cervical back: Normal range of motion.  Skin:    General: Skin is warm.     Capillary Refill: Capillary refill takes less than 2 seconds.  Neurological:     General: No focal deficit present.     Mental Status: He is alert.  Psychiatric:        Mood and Affect: Mood normal.       Labs on Admission: I have personally reviewed the patients's labs and imaging studies.  Assessment/Plan Principal Problem:   Unstable angina (HCC)   # Unstable angina # CAD status post CABG # Aortic stenosis status post TAVR # Ischemic cardiomyopathy, not in exacerbation - Patient presented with elevated troponin in setting of chest pain - Cardiology consulted - Patient has significant history  Plan: Start hep drip Continue Plavix  As needed nitroglycerin  Continue spironolactone  Continue Crestor  Continue with beta-blocker  # Hypothyroidism-continue levothyroxine   # Depression-continue Cymbalta   # CKD stage IIIb-trend creatinine  Admission status: Inpatient Progressive  Certification: The appropriate patient status for this patient is INPATIENT. Inpatient status is judged to be reasonable and necessary in order to  provide the required intensity of service to ensure the patient's safety. The patient's presenting symptoms, physical exam findings, and initial radiographic and laboratory data in the context of their chronic comorbidities is felt to place them at high risk for further clinical deterioration. Furthermore, it is not anticipated that the patient will be medically stable for discharge from  the hospital within 2 midnights of admission.   * I certify that at the point of admission it is my clinical judgment that the patient will require inpatient hospital care spanning beyond 2 midnights from the point of admission due to high intensity of service, high risk for further deterioration and high frequency of surveillance required.DEWAINE Lamar Dess MD [facility] If 7PM-7AM, please contact night-coverage [email or web address]  02/25/2024, 3:47 AM

## 2024-02-26 DIAGNOSIS — I2510 Atherosclerotic heart disease of native coronary artery without angina pectoris: Secondary | ICD-10-CM | POA: Diagnosis not present

## 2024-02-26 DIAGNOSIS — I200 Unstable angina: Secondary | ICD-10-CM | POA: Diagnosis not present

## 2024-02-26 DIAGNOSIS — I214 Non-ST elevation (NSTEMI) myocardial infarction: Secondary | ICD-10-CM | POA: Diagnosis not present

## 2024-02-26 DIAGNOSIS — I255 Ischemic cardiomyopathy: Secondary | ICD-10-CM | POA: Diagnosis not present

## 2024-02-26 DIAGNOSIS — E785 Hyperlipidemia, unspecified: Secondary | ICD-10-CM | POA: Diagnosis not present

## 2024-02-26 LAB — CBC
HCT: 34.7 % — ABNORMAL LOW (ref 39.0–52.0)
Hemoglobin: 11.5 g/dL — ABNORMAL LOW (ref 13.0–17.0)
MCH: 27.1 pg (ref 26.0–34.0)
MCHC: 33.1 g/dL (ref 30.0–36.0)
MCV: 81.6 fL (ref 80.0–100.0)
Platelets: 263 K/uL (ref 150–400)
RBC: 4.25 MIL/uL (ref 4.22–5.81)
RDW: 13.3 % (ref 11.5–15.5)
WBC: 9.7 K/uL (ref 4.0–10.5)
nRBC: 0 % (ref 0.0–0.2)

## 2024-02-26 LAB — APTT: aPTT: 86 s — ABNORMAL HIGH (ref 24–36)

## 2024-02-26 LAB — HEPARIN LEVEL (UNFRACTIONATED): Heparin Unfractionated: 0.48 [IU]/mL (ref 0.30–0.70)

## 2024-02-26 MED ORDER — ASPIRIN 81 MG PO CHEW
81.0000 mg | CHEWABLE_TABLET | ORAL | Status: AC
  Administered 2024-02-27: 81 mg via ORAL
  Filled 2024-02-26: qty 1

## 2024-02-26 MED ORDER — FREE WATER
500.0000 mL | Freq: Once | Status: AC
  Administered 2024-02-27: 500 mL via ORAL

## 2024-02-26 NOTE — Plan of Care (Signed)
   Problem: Health Behavior/Discharge Planning: Goal: Ability to manage health-related needs will improve Outcome: Progressing   Problem: Clinical Measurements: Goal: Ability to maintain clinical measurements within normal limits will improve Outcome: Progressing   Problem: Clinical Measurements: Goal: Will remain free from infection Outcome: Progressing

## 2024-02-26 NOTE — Progress Notes (Signed)
 PHARMACY - ANTICOAGULATION CONSULT NOTE  Pharmacy Consult for heparin  Indication: chest pain/ACS  Allergies  Allergen Reactions   Tetracyclines & Related Hives and Itching   Cozaar [Losartan] Cough   Zestril  [Lisinopril ] Cough   Lipitor [Atorvastatin] Other (See Comments)    Myalgias     Patient Measurements: Height: 5' 6 (167.6 cm) Weight: 61.7 kg (136 lb 0.4 oz) IBW/kg (Calculated) : 63.8 HEPARIN  DW (KG): 61.7  Vital Signs: Temp: 98 F (36.7 C) (11/02 1127) Temp Source: Oral (11/02 1127) BP: 125/59 (11/02 1127) Pulse Rate: 89 (11/02 1127)  Labs: Recent Labs    02/24/24 2159 02/25/24 0012 02/25/24 0435 02/25/24 1000 02/25/24 2040 02/26/24 0923  HGB 11.3*  --  10.8*  --   --  11.5*  HCT 35.7*  --  33.5*  --   --  34.7*  PLT 286  --  259  --   --  263  APTT  --   --   --  53* 64* 86*  LABPROT  --   --  16.7*  --   --   --   INR  --   --  1.3*  --   --   --   HEPARINUNFRC  --   --   --  0.82* 0.57 0.48  CREATININE 1.38*  --  1.28*  --   --   --   TROPONINIHS 167* 157*  --   --   --   --     Estimated Creatinine Clearance: 40.2 mL/min (A) (by C-G formula based on SCr of 1.28 mg/dL (H)).   Medical History: Past Medical History:  Diagnosis Date   Age-related macular degeneration, dry, both eyes    Anxiety    Aortic stenosis    Arthritis    probably all over (06/08/2016)   Cardiomyopathy (HCC)    a. EF 40-45% in 2018, normalized in 03/2020.   Chronic back pain    started in my lower back; going up my back in the last couple months (06/08/2016)   Chronic sinusitis    Coronary artery disease    a. s/p CABGx 2V (2014 in [place])  b. PCI (2015 in [place]). c. PCI 2018 ([facility]). d. PCI 03/2020 (complex - [facility]).   Diabetic peripheral neuropathy (HCC)    DVT (deep venous thrombosis) (HCC)    left groin; it was there when I had bypass OR; get it checked q yr; still there now (06/08/2016)   GERD (gastroesophageal reflux disease)    Hepatitis B    History  of bleeding ulcers    History of diverticulitis    History of hiatal hernia    HLD (hyperlipidemia)    HTN (hypertension)    LV dysfunction, post MI EF 40-45%. 06/09/2016   NSTEMI (non-ST elevated myocardial infarction) (HCC) 06/08/2016   Pneumonia    3-4 times maybe (06/08/2016)   PVD (peripheral vascular disease)    Recovering alcoholic (HCC)    picked up my 30 year chip the other day (06/08/2016)   S/P angioplasty with stent, 06/08/16 DES, for in-stent restenosis in RCA. 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 Wonda and Dr. Shyrl.   Sleep apnea    got a mask after OR; don't use it (06/08/2016)   Type II diabetes mellitus (HCC)     Medications:  No current facility-administered medications on file prior to encounter.  Current Outpatient Medications on File Prior to Encounter  Medication Sig Dispense Refill   acetaminophen  (TYLENOL ) 500 MG tablet Take 500 mg by mouth 2 (two) times daily.     amoxicillin  (AMOXIL ) 500 MG tablet Take 4 tablets (2,000 mg total) by mouth as directed. 1 hour prior to dental work including cleanings 12 tablet 12   apixaban  (ELIQUIS ) 2.5 MG TABS tablet Take 1 tablet (2.5 mg total) by mouth 2 (two) times daily. 60 tablet 2   BIOTIN PO Take 1 tablet by mouth daily at 12 noon.     capsaicin (ZOSTRIX) 0.025 % cream Apply 1 Application topically in the morning, at noon, and at bedtime.     carboxymethylcellulose (REFRESH PLUS) 0.5 % SOLN Place 1 drop into both eyes See admin instructions. Administer 1 drop into each eye every daily. May use every 12 hours if needed for dry eyes.     Cholecalciferol (VITAMIN D-3 PO) Take 1 tablet by mouth daily at 12 noon.     clopidogrel  (PLAVIX ) 75 MG tablet Take 1 tablet (75 mg total) by mouth daily. 90 tablet 2   Coenzyme Q10 (COQ10 PO) Take 1 capsule by mouth daily at 12 noon.     Cyanocobalamin (VITAMIN B12 PO) Take 1  tablet by mouth daily at 12 noon.     diclofenac Sodium (VOLTAREN) 1 % GEL 1 Application every 6 (six) hours as needed for pain.     DULoxetine  (CYMBALTA ) 30 MG capsule Take 30 mg by mouth at bedtime.     empagliflozin  (JARDIANCE ) 10 MG TABS tablet Take 1 tablet (10 mg total) by mouth daily.     fluticasone  (FLONASE ) 50 MCG/ACT nasal spray Place 1-2 sprays into both nostrils daily as needed for allergies or rhinitis.     ketoconazole (NIZORAL) 2 % cream Apply 1 Application topically 2 (two) times daily as needed for irritation.     ketoconazole (NIZORAL) 2 % shampoo Apply 1 Application topically every 3 (three) days.     levOCARNitine (L-CARNITINE PO) Take 2 tablets by mouth daily at 12 noon.     levothyroxine  (SYNTHROID ) 75 MCG tablet Take 75 mcg by mouth daily before breakfast.     lidocaine  (LIDODERM ) 5 % Place 1 patch onto the skin daily as needed (pain).     loratadine  (CLARITIN ) 10 MG tablet Take 10 mg by mouth daily at 12 noon.     MAGNESIUM  PO Take 2 tablets by mouth daily at 12 noon.     metFORMIN  (GLUCOPHAGE ) 500 MG tablet Take 1,000 mg by mouth 2 (two) times daily as needed (BS > 140). (Patient taking differently: Take 1,000 mg by mouth 2 (two) times daily as needed (BS > 140). Only taking in the morning)     methocarbamol (ROBAXIN) 500 MG tablet Take 500 mg by mouth at bedtime.     metoprolol  succinate (TOPROL -XL) 25 MG 24 hr tablet Take 1 tablet (25 mg total) by mouth at bedtime. 90 tablet 1   MILK THISTLE PO Take 1 tablet by mouth daily at 12 noon.     Multiple Vitamins-Minerals (PRESERVISION AREDS 2) CAPS Take 1 capsule by mouth 2 (two) times daily.     nitroGLYCERIN  (NITROSTAT ) 0.4 MG SL tablet Place 1 tablet (0.4 mg total) under the tongue See admin instructions. Take 1 tablet (0.4) mg by mouth every day. May take an additional 1 tablet later in the day if  chest pain.     OVER THE COUNTER MEDICATION Take 1 capsule by  mouth daily at 12 noon. VisiUltra     OVER THE COUNTER  MEDICATION Take 1 capsule by mouth daily at 12 noon. blood boost supplement for glucose control     Probiotic Product (PROBIOTIC PO) Take 2 capsules by mouth daily at 12 noon.     QUERCETIN PO Take 1 tablet by mouth daily at 12 noon.     rosuvastatin  (CRESTOR ) 40 MG tablet Take 1 tablet (40 mg total) by mouth daily. 30 tablet 11   SELENIUM PO Take 1 tablet by mouth daily at 12 noon.     spironolactone  (ALDACTONE ) 25 MG tablet Take 0.5 tablets (12.5 mg total) by mouth daily. 90 tablet 0     Assessment: 78 y.o. male presenting with CP, hx of CAD s/p CABG, he is on Eliquis  PTA for afib, heparin  started on admission  Heparin  level came back therapeutic at 0.48(likely still being impacted by recent DOAC), aPTT came back therapeutic at 86, on 1000 units/hr  Goal of Therapy:  aPTT 66-102 sec Heparin  level 0.3-0.7 units/ml Monitor platelets by anticoagulation protocol: Yes   Plan:  -Continue heparin  at 1000 units/hr -Daily heparin  level, aPTT and CBC   Thank you for allowing pharmacy to participate in this patient's care,

## 2024-02-26 NOTE — Plan of Care (Signed)
   Problem: Education: Goal: Knowledge of General Education information will improve Description: Including pain rating scale, medication(s)/side effects and non-pharmacologic comfort measures Outcome: Progressing   Problem: Activity: Goal: Risk for activity intolerance will decrease Outcome: Progressing   Problem: Nutrition: Goal: Adequate nutrition will be maintained Outcome: Progressing

## 2024-02-26 NOTE — Progress Notes (Signed)
 1 PROGRESS NOTE    Luke Harvey  FMW:[id number] DOB: 16-Jul-1945 DOA: 02/24/2024 PCP: [facility]  Outpatient Specialists:     Brief Narrative:  Patient is an 78 year old male with past medical history significant for coronary artery disease s/p 2v CABG (LIMA to LAD, , aortic stenosis s/p TAVR in 11/04/2023, TDM-II, pAF, CKD, hyperlipidemia, and paroxysmal SVT.  Patient was admitted with chest pain.  Elevated troponin (167-157).  CTA chest was negative for pulmonary embolism.  EKG revealed probable LVH with repolarization.  Patient is on heparin  drip.  Cardiology team is assisting in directing patient's care.  02/25/2024: Patient seen.  No chest pain at the moment. 02/26/2024: Patient seen alongside patient's daughter.  No chest pain today.  Cardiology team plans cardiac catheterization tomorrow.  Episode of chest pain radiating to both upper extremities reported last night.  Assessment & Plan:   Principal Problem:   Unstable angina (HCC) Active Problems:   Non-ST elevation (NSTEMI) myocardial infarction [facility])   Chest pain/unstable angina: - History of two-vessel CABG. - Chest pain-free since today.  Chest pain reported last night.   - Continue heparin  drip.  . - Continue Plavix , Toprol  XL, Crestor  and Aldactone . - For cardiac catheterization tomorrow.  Hypothyroidism: - Continue levothyroxine .  Depression: - Continue Cymbalta .  Chronic kidney disease stage IIIa: - Stable.  DVT prophylaxis: Patient is on heparin  drip. Code Status: Full code Family Communication:  Disposition Plan: This will depend on hospital course   Consultants:  Cardiology  Procedures:  None  Antimicrobials:  None   Subjective: No chest pain at the moment. No shortness of breath  Objective: Vitals:   02/25/24 1944 02/26/24 0003 02/26/24 0331 02/26/24 0807  BP: (!) 145/78 (!) 156/71  128/86  Pulse: (!) 119 (!) 117 94 94  Resp: 18 19 (!) 30 20  Temp: 98.2 F (36.8 C) 99.7  F (37.6 C) 98.9 F (37.2 C) 98.7 F (37.1 C)  TempSrc: Oral Oral Oral Oral  SpO2: 92% 95% 96% 95%  Weight:      Height:        Intake/Output Summary (Last 24 hours) at 02/26/2024 1031 Last data filed at 02/26/2024 0520 Gross per 24 hour  Intake 133.97 ml  Output 1600 ml  Net -1466.03 ml   Filed Weights   02/24/24 2149  Weight: 61.7 kg    Examination:  General exam: Appears calm and comfortable  Respiratory system: Clear to auscultation.  Cardiovascular system: S1 & S2 heard Gastrointestinal system: Abdomen is soft and nontender.  Central nervous system: Alert and oriented.  Extremities: No leg edema.  Data Reviewed: I have personally reviewed following labs and imaging studies  CBC: Recent Labs  Lab 02/24/24 2159 02/25/24 0435 02/26/24 0923  WBC 8.1 8.5 9.7  HGB 11.3* 10.8* 11.5*  HCT 35.7* 33.5* 34.7*  MCV 85.6 84.0 81.6  PLT 286 259 263   Basic Metabolic Panel: Recent Labs  Lab 02/24/24 2159 02/25/24 0435  NA 135 134*  K 3.8 3.9  CL 99 99  CO2 23 20*  GLUCOSE 196* 169*  BUN 19 20  CREATININE 1.38* 1.28*  CALCIUM  8.9 8.7*   GFR: Estimated Creatinine Clearance: 40.2 mL/min (A) (by C-G formula based on SCr of 1.28 mg/dL (H)). Liver Function Tests: No results for input(s): AST, ALT, ALKPHOS, BILITOT, PROT, ALBUMIN in the last 168 hours. No results for input(s): LIPASE, AMYLASE in the last 168 hours. No results for input(s): AMMONIA in the last 168 hours. Coagulation Profile: Recent  Labs  Lab 02/25/24 0435  INR 1.3*   Cardiac Enzymes: No results for input(s): CKTOTAL, CKMB, CKMBINDEX, TROPONINI in the last 168 hours. BNP (last 3 results) No results for input(s): PROBNP in the last 8760 hours. HbA1C: No results for input(s): HGBA1C in the last 72 hours. CBG: No results for input(s): GLUCAP in the last 168 hours. Lipid Profile: No results for input(s): CHOL, HDL, LDLCALC, TRIG, CHOLHDL, LDLDIRECT in  the last 72 hours. Thyroid Function Tests: No results for input(s): TSH, T4TOTAL, FREET4, T3FREE, THYROIDAB in the last 72 hours. Anemia Panel: Recent Labs    02/25/24 0238  TIBC 259  IRON 21*   Urine analysis:    Component Value Date/Time   COLORURINE YELLOW 02/24/2024 2203   APPEARANCEUR CLEAR 02/24/2024 2203   LABSPEC 1.029 02/24/2024 2203   PHURINE 5.0 02/24/2024 2203   GLUCOSEU >=500 (A) 02/24/2024 2203   HGBUR NEGATIVE 02/24/2024 2203   BILIRUBINUR NEGATIVE 02/24/2024 2203   KETONESUR NEGATIVE 02/24/2024 2203   PROTEINUR NEGATIVE 02/24/2024 2203   NITRITE NEGATIVE 02/24/2024 2203   LEUKOCYTESUR NEGATIVE 02/24/2024 2203   Sepsis Labs: @LABRCNTIP (procalcitonin:4,lacticidven:4)  ) Recent Results (from the past 240 hours)  MRSA Next Gen by PCR, Nasal     Status: Abnormal   Collection Time: 02/25/24  2:47 PM   Specimen: Nasal Mucosa; Nasal Swab  Result Value Ref Range Status   MRSA by PCR Next Gen DETECTED (A) NOT DETECTED Final    Comment: RESULT CALLED TO, READ BACK BY AND VERIFIED WITH: RN AMINA M. 1101 AT 1349, ADC (NOTE) The GeneXpert MRSA Assay (FDA approved for NASAL specimens only), is one component of a comprehensive MRSA colonization surveillance program. It is not intended to diagnose MRSA infection nor to guide or monitor treatment for MRSA infections. Test performance is not FDA approved in patients less than 33 years old. Performed at [facility], [address] [place], [place] [address]          Radiology Studies: CT Angio Chest PE W and/or Wo Contrast Result Date: 02/25/2024 EXAM: CTA of the Chest with contrast for PE 02/25/2024 12:31:37 AM TECHNIQUE: CTA of the chest was performed after the administration of intravenous contrast. Multiplanar reformatted images are provided for review. MIP images are provided for review. Automated exposure control, iterative reconstruction, and/or weight based adjustment of the mA/kV was  utilized to reduce the radiation dose to as low as reasonably achievable. COMPARISON: 12/07/2023 CLINICAL HISTORY: Syncope/presyncope, cerebrovascular cause suspected. FINDINGS: PULMONARY ARTERIES: Pulmonary arteries are adequately opacified for evaluation. No pulmonary embolism. Main pulmonary artery is normal in caliber. MEDIASTINUM: The heart and pericardium demonstrate no acute abnormality. Aortic atherosclerosis. There is no acute abnormality of the thoracic aorta. LYMPH NODES: No mediastinal, hilar or axillary lymphadenopathy. LUNGS AND PLEURA: The lungs are without acute process. No focal consolidation or pulmonary edema. Previously seen bilateral effusions have resolved. Scarring in the lower lobes stable. No pneumothorax. UPPER ABDOMEN: Limited images of the upper abdomen are unremarkable. SOFT TISSUES AND BONES: No acute bone or soft tissue abnormality. IMPRESSION: 1. No pulmonary embolism. 2. Bibasilar scarring. 3. Aortic atherosclerosis. 4. No acute findings. Electronically signed by: Franky Crease MD 02/25/2024 12:37 AM EDT RP Workstation: [id number]   DG Chest 2 View Result Date: 02/24/2024 CLINICAL DATA:  Chest pain for 1 week EXAM: CHEST - 2 VIEW COMPARISON:  12/07/2023 FINDINGS: Frontal and lateral views of the chest demonstrate postsurgical changes from median sternotomy and aortic valve prosthesis. Cardiac silhouette is unremarkable. Increased  prominence of the pulmonary interstitium consistent with developing interstitial edema and mild volume overload. No effusions or pneumothorax. No acute bony abnormalities. IMPRESSION: 1. Increased interstitial prominence compatible with developing interstitial edema. Electronically Signed   By: Ozell Daring M.D.   On: 02/24/2024 22:29        Scheduled Meds:  Chlorhexidine  Gluconate Cloth  6 each Topical Daily   clopidogrel   75 mg Oral Daily   DULoxetine   30 mg Oral QHS   levothyroxine   75 mcg Oral QAC breakfast   metoprolol  succinate  25 mg  Oral QHS   mupirocin  ointment  1 Application Nasal BID   rosuvastatin   40 mg Oral Daily   spironolactone   12.5 mg Oral Daily   Continuous Infusions:  heparin  1,000 Units/hr (02/26/24 0520)     LOS: 1 day    Time spent: 35 minutes    Leatrice Chapel, MD  [facility] Pager #: [phone] 7PM-7AM contact night coverage as above

## 2024-02-26 NOTE — Progress Notes (Signed)
  Progress Note  Patient Name: Luke Harvey Date of Encounter: 02/26/2024 [facility] Cardiologist: Lonni LITTIE Nanas, MD   Interval Summary   No chest pain currently but did have another episode overnight requiring nitroglycerin  with bilateral arm pain.  Hemoglobin 11.3, creatinine 1.28 stable  Vital Signs Vitals:   02/25/24 1944 02/26/24 0003 02/26/24 0331 02/26/24 0807  BP: (!) 145/78 (!) 156/71  128/86  Pulse: (!) 119 (!) 117 94 94  Resp: 18 19 (!) 30 20  Temp: 98.2 F (36.8 C) 99.7 F (37.6 C) 98.9 F (37.2 C) 98.7 F (37.1 C)  TempSrc: Oral Oral Oral Oral  SpO2: 92% 95% 96% 95%  Weight:      Height:        Intake/Output Summary (Last 24 hours) at 02/26/2024 0835 Last data filed at 02/26/2024 0520 Gross per 24 hour  Intake 133.97 ml  Output 1600 ml  Net -1466.03 ml      02/24/2024    9:49 PM 12/22/2023    1:25 PM 12/07/2023    3:29 PM  Last 3 Weights  Weight (lbs) 136 lb 0.4 oz 136 lb 133 lb 9.6 oz  Weight (kg) 61.7 kg 61.689 kg 60.6 kg      Telemetry/ECG  No adverse arrhythmias- Personally Reviewed  Physical Exam  GEN: No acute distress.   Neck: No JVD Cardiac: RRR, no murmurs, rubs, or gallops.  Respiratory: Clear to auscultation bilaterally. GI: Soft, nontender, non-distended  MS: No edema   Diagnostic               Dominance: Right                                                             Intervention                                              Assessment & Plan   78 year old male complex cardiac history CABG PCI to SVG to OM graft on 10/24/2023, TAVR, presented with 1 week of intermittent chest discomfort elevated troponin at 160 pain relieved with nitroglycerin  improved with IV heparin , non-STEMI  Non-STEMI -Plan on cardiac catheterization to evaluate bypass grafts tomorrow, Monday, 02/27/2024.  Risk and benefits of been explained including stroke heart attack death renal bleed bleeding he is willing to proceed. - IV heparin   drip -T wave inversion was new on ECG, however had does have the appearance of LVH with repolarization but this was not seen on prior.  CAD - On Plavix  75 mg once a day  Hyperlipidemia - Continuing with rosuvastatin  40 mg a day with LDL goal less than 55  Ischemic cardiomyopathy with last EF 50 to 55% on 12/22/2023 - Continue with Toprol  25 mg spironolactone  12.5 mg a day    For questions or updates, please contact [facility] Please consult [email or web address] for contact info under         Signed, Oneil Parchment, MD

## 2024-02-26 NOTE — Plan of Care (Signed)

## 2024-02-27 ENCOUNTER — Inpatient Hospital Stay (HOSPITAL_COMMUNITY)
Admission: EM | Disposition: A | Discharge status: Home / Self Care | Payer: Self-pay | Source: Home / Self Care | Attending: Internal Medicine

## 2024-02-27 ENCOUNTER — Encounter: Payer: Self-pay | Admitting: Radiology

## 2024-02-27 DIAGNOSIS — I350 Nonrheumatic aortic (valve) stenosis: Secondary | ICD-10-CM | POA: Diagnosis not present

## 2024-02-27 DIAGNOSIS — I200 Unstable angina: Secondary | ICD-10-CM | POA: Diagnosis not present

## 2024-02-27 DIAGNOSIS — E785 Hyperlipidemia, unspecified: Secondary | ICD-10-CM | POA: Diagnosis not present

## 2024-02-27 DIAGNOSIS — I480 Paroxysmal atrial fibrillation: Secondary | ICD-10-CM

## 2024-02-27 DIAGNOSIS — I214 Non-ST elevation (NSTEMI) myocardial infarction: Secondary | ICD-10-CM | POA: Diagnosis not present

## 2024-02-27 DIAGNOSIS — I25810 Atherosclerosis of coronary artery bypass graft(s) without angina pectoris: Secondary | ICD-10-CM

## 2024-02-27 LAB — CBC
HCT: 35.3 % — ABNORMAL LOW (ref 39.0–52.0)
Hemoglobin: 11.5 g/dL — ABNORMAL LOW (ref 13.0–17.0)
MCH: 27.1 pg (ref 26.0–34.0)
MCHC: 32.6 g/dL (ref 30.0–36.0)
MCV: 83.3 fL (ref 80.0–100.0)
Platelets: 269 K/uL (ref 150–400)
RBC: 4.24 MIL/uL (ref 4.22–5.81)
RDW: 13.4 % (ref 11.5–15.5)
WBC: 7.5 K/uL (ref 4.0–10.5)
nRBC: 0 % (ref 0.0–0.2)

## 2024-02-27 LAB — LIPID PANEL
Cholesterol: 124 mg/dL (ref 0–200)
HDL: 38 mg/dL — ABNORMAL LOW (ref >40)
LDL Cholesterol: 67 mg/dL (ref 0–99)
Total CHOL/HDL Ratio: 3.3 ratio
Triglycerides: 97 mg/dL (ref <150)
VLDL: 19 mg/dL (ref 0–40)

## 2024-02-27 LAB — RENAL FUNCTION PANEL
Albumin: 3 g/dL — ABNORMAL LOW (ref 3.5–5.0)
Anion gap: 15 (ref 5–15)
BUN: 25 mg/dL — ABNORMAL HIGH (ref 8–23)
CO2: 22 mmol/L (ref 22–32)
Calcium: 9.1 mg/dL (ref 8.9–10.3)
Chloride: 99 mmol/L (ref 98–111)
Creatinine, Ser: 1.34 mg/dL — ABNORMAL HIGH (ref 0.61–1.24)
GFR, Estimated: 54 mL/min — ABNORMAL LOW (ref >60)
Glucose, Bld: 129 mg/dL — ABNORMAL HIGH (ref 70–99)
Phosphorus: 3.8 mg/dL (ref 2.5–4.6)
Potassium: 4 mmol/L (ref 3.5–5.1)
Sodium: 136 mmol/L (ref 135–145)

## 2024-02-27 LAB — POCT ACTIVATED CLOTTING TIME
Activated Clotting Time: 262 s
Activated Clotting Time: 285 s

## 2024-02-27 LAB — HEPARIN LEVEL (UNFRACTIONATED): Heparin Unfractionated: 0.27 [IU]/mL — ABNORMAL LOW (ref 0.30–0.70)

## 2024-02-27 LAB — APTT: aPTT: 77 s — ABNORMAL HIGH (ref 24–36)

## 2024-02-27 SURGERY — LEFT HEART CATH AND CORS/GRAFTS ANGIOGRAPHY
Anesthesia: LOCAL

## 2024-02-27 MED ORDER — LIDOCAINE HCL (PF) 1 % IJ SOLN
INTRAMUSCULAR | Status: AC
  Filled 2024-02-27: qty 30

## 2024-02-27 MED ORDER — VERAPAMIL HCL 2.5 MG/ML IV SOLN
INTRAVENOUS | Status: DC | PRN
  Administered 2024-02-27: 10 mL via INTRA_ARTERIAL

## 2024-02-27 MED ORDER — SODIUM CHLORIDE 0.9% FLUSH
3.0000 mL | INTRAVENOUS | Status: DC | PRN
Start: 2024-02-27 — End: 2024-02-28

## 2024-02-27 MED ORDER — LIDOCAINE HCL (PF) 1 % IJ SOLN
INTRAMUSCULAR | Status: DC | PRN
  Administered 2024-02-27: 2 mL via INTRADERMAL

## 2024-02-27 MED ORDER — FENTANYL CITRATE (PF) 100 MCG/2ML IJ SOLN
INTRAMUSCULAR | Status: AC
  Filled 2024-02-27: qty 2

## 2024-02-27 MED ORDER — HEPARIN (PORCINE) 25000 UT/250ML-% IV SOLN
1150.0000 [IU]/h | INTRAVENOUS | Status: DC
  Administered 2024-02-27: 1150 [IU]/h via INTRAVENOUS
  Filled 2024-02-27: qty 250

## 2024-02-27 MED ORDER — HEPARIN SODIUM (PORCINE) 1000 UNIT/ML IJ SOLN
INTRAMUSCULAR | Status: DC | PRN
  Administered 2024-02-27: 1000 [IU] via INTRAVENOUS
  Administered 2024-02-27: 3000 [IU] via INTRAVENOUS
  Administered 2024-02-27: 2000 [IU] via INTRAVENOUS
  Administered 2024-02-27: 3000 [IU] via INTRAVENOUS

## 2024-02-27 MED ORDER — SODIUM CHLORIDE 0.9 % IV SOLN: 250.0000 mL | INTRAVENOUS | Status: DC | PRN

## 2024-02-27 MED ORDER — SODIUM CHLORIDE 0.9% FLUSH
3.0000 mL | Freq: Two times a day (BID) | INTRAVENOUS | Status: DC
  Administered 2024-02-27 – 2024-02-28 (×2): 3 mL via INTRAVENOUS

## 2024-02-27 MED ORDER — MIDAZOLAM HCL 2 MG/2ML IJ SOLN
INTRAMUSCULAR | Status: AC
  Filled 2024-02-27: qty 2

## 2024-02-27 MED ORDER — IOHEXOL 350 MG/ML SOLN
INTRAVENOUS | Status: DC | PRN
  Administered 2024-02-27: 100 mL

## 2024-02-27 MED ORDER — HEPARIN (PORCINE) IN NACL 1000-0.9 UT/500ML-% IV SOLN
INTRAVENOUS | Status: DC | PRN
  Administered 2024-02-27 (×2): 500 mL

## 2024-02-27 MED ORDER — FENTANYL CITRATE (PF) 100 MCG/2ML IJ SOLN
INTRAMUSCULAR | Status: DC | PRN
  Administered 2024-02-27 (×2): 12.5 ug via INTRAVENOUS

## 2024-02-27 MED ORDER — SODIUM CHLORIDE 0.9 % IV SOLN: INTRAVENOUS | Status: AC

## 2024-02-27 MED ORDER — CLOPIDOGREL BISULFATE 300 MG PO TABS
ORAL_TABLET | ORAL | Status: DC | PRN
  Administered 2024-02-27: 300 mg via ORAL

## 2024-02-27 MED ORDER — MIDAZOLAM HCL (PF) 2 MG/2ML IJ SOLN
INTRAMUSCULAR | Status: DC | PRN
  Administered 2024-02-27: .5 mg via INTRAVENOUS

## 2024-02-27 MED ORDER — HEPARIN SODIUM (PORCINE) 1000 UNIT/ML IJ SOLN
INTRAMUSCULAR | Status: AC
  Filled 2024-02-27: qty 10

## 2024-02-27 MED ORDER — VERAPAMIL HCL 2.5 MG/ML IV SOLN
INTRAVENOUS | Status: AC
Start: 2024-02-27 — End: 2024-02-27
  Filled 2024-02-27: qty 2

## 2024-02-27 MED ORDER — HYDRALAZINE HCL 20 MG/ML IJ SOLN: 10.0000 mg | INTRAMUSCULAR | Status: AC | PRN

## 2024-02-27 MED ORDER — CLOPIDOGREL BISULFATE 300 MG PO TABS
ORAL_TABLET | ORAL | Status: AC
  Filled 2024-02-27: qty 1

## 2024-02-27 MED ORDER — LABETALOL HCL 5 MG/ML IV SOLN: 10.0000 mg | INTRAVENOUS | Status: AC | PRN

## 2024-02-27 MED ORDER — ASPIRIN 81 MG PO CHEW
81.0000 mg | CHEWABLE_TABLET | Freq: Every day | ORAL | Status: DC
  Administered 2024-02-28: 81 mg via ORAL
  Filled 2024-02-27: qty 1

## 2024-02-27 SURGICAL SUPPLY — 15 items
BALL DC AGENT 3.50X15 (BALLOONS) IMPLANT
BALLOON EMERGE MR 3.0X12 (BALLOONS) IMPLANT
BALLOON SAPPHIRE NC24 3.0X12 (BALLOONS) IMPLANT
CATH INFINITI 5 FR IM (CATHETERS) IMPLANT
CATH INFINITI 5 FR MPA2 (CATHETERS) IMPLANT
CATH INFINITI 5FR JL4 (CATHETERS) IMPLANT
CATH VISTA GUIDE 6FR AL1 MULPK (CATHETERS) IMPLANT
DEVICE RAD COMP TR BAND LRG (VASCULAR PRODUCTS) IMPLANT
GLIDESHEATH SLEND SS 6F .021 (SHEATH) IMPLANT
GUIDEWIRE INQWIRE 1.5J.035X260 (WIRE) IMPLANT
KIT ENCORE 26 ADVANTAGE (KITS) IMPLANT
PACK CARDIAC CATHETERIZATION (CUSTOM PROCEDURE TRAY) ×2 IMPLANT
SET ATX-X65L (MISCELLANEOUS) IMPLANT
SHEATH PROBE COVER 6X72 (BAG) IMPLANT
WIRE RUNTHROUGH .014X180CM (WIRE) IMPLANT

## 2024-02-27 NOTE — H&P (View-Only) (Signed)
  Progress Note  Patient Name: Luke Harvey Date of Encounter: 02/27/2024 [facility] Cardiologist: Lonni LITTIE Nanas, MD   Interval Summary   No chest pain or shortness of breath overnight.  States that symptoms resolved since arriving in the hospital and being started on IV heparin .  He described chest discomfort radiating down the back of both arms prompting his hospitalization.  Vital Signs Vitals:   02/26/24 1927 02/26/24 2229 02/27/24 0000 02/27/24 0400  BP: 127/74 132/81 125/74 129/70  Pulse: (!) 101 99 95 93  Resp: 20 18 20 15   Temp: 98.5 F (36.9 C) 98.4 F (36.9 C)  98.3 F (36.8 C)  TempSrc: Oral Oral  Oral  SpO2: 99% 98% 97% 96%  Weight:      Height:        Intake/Output Summary (Last 24 hours) at 02/27/2024 0616 Last data filed at 02/27/2024 0431 Gross per 24 hour  Intake 1158.42 ml  Output 1325 ml  Net -166.58 ml      02/24/2024    9:49 PM 12/22/2023    1:25 PM 12/07/2023    3:29 PM  Last 3 Weights  Weight (lbs) 136 lb 0.4 oz 136 lb 133 lb 9.6 oz  Weight (kg) 61.7 kg 61.689 kg 60.6 kg      Telemetry/ECG  Sinus rhythm with PACs- Personally Reviewed  Physical Exam  GEN: No acute distress.   Neck: No JVD Cardiac: RRR, 1/6 systolic ejection murmur at the right upper sternal border Respiratory: Clear to auscultation bilaterally. GI: Soft, nontender, non-distended  MS: No edema.  Left radial pulse 2+  Assessment & Plan  NSTEMI: complex cardiac hx with CABG, multiple PCI procedures. He last underwent SVG-OM intervention in 09/2023. Ruled in for NSTEMI with elevated HS-Trop to 160. Continue IV heparin . Plans for cardiac cath and possible PCI, consent obtained.  Chest pain-free since arrival on IV heparin .  Patient on aspirin , clopidogrel , metoprolol  succinate, and rosuvastatin .  Patient with 2+ left radial pulse and previous catheterization from left radial approach.  High likelihood that he has recurrent in-stent restenosis/thrombotic lesion  in SVG to OM. Reviewed old notes - he had significant dyspnea that improved after changing ticagrelor --->clopidogrel . Mixed hyperlipidemia: on rosuvastatin  40 mg daily. Aortic stenosis s/p TAVR: normal TAVR function on echo 12/22/23 with trivial PVL and mean gradient 5 mmHg.  Paroxysmal atrial fibrillation: Has been on apixaban  2.5 mg twice daily. Dispo: pending cardiac cath result.   For questions or updates, please contact [facility] Please consult [email or web address] for contact info under         Signed, [name], MD

## 2024-02-27 NOTE — TOC Initial Note (Signed)
 Transition of Care [facility]) - Initial/Assessment Note    Patient Details  Name: Luke Harvey MRN: [id number] Date of Birth: May 13, 1945  Transition of [facility] CM/SW Contact:    Roxie KANDICE Stain, RN Phone Number: 02/27/2024, 3:14 PM  Clinical Narrative:                 Spoke to patient regarding transition needs. Patient lives alone and drives himself to apts. Daughter is able to assist as needed.  Patient is followed by [facility] for PCP. Patient gets his prescriptions from the [place].  No ICM (Inpatient Care Management) needs at this time. Will continue to follow.   Expected Discharge Plan: Home/Self Care Barriers to Discharge: Continued Medical Work up   Patient Goals and CMS Choice Patient states their goals for this hospitalization and ongoing recovery are:: return home          Expected Discharge Plan and Services   Discharge Planning Services: CM Consult   Living arrangements for the past 2 months: Single Family Home                                      Prior Living Arrangements/Services Living arrangements for the past 2 months: Single Family Home Lives with:: Self (walker cane) Patient language and need for interpreter reviewed:: Yes Do you feel safe going back to the place where you live?: Yes      Need for Family Participation in Patient Care: Yes (Comment) Care giver support system in place?: Yes (comment) Current home services: DME Criminal Activity/Legal Involvement Pertinent to Current Situation/Hospitalization: No - Comment as needed  Activities of Daily Living   ADL Screening (condition at time of admission) Independently performs ADLs?: Yes (appropriate for developmental age) Is the patient deaf or have difficulty hearing?: Yes Does the patient have difficulty seeing, even when wearing glasses/contacts?: No Does the patient have difficulty concentrating, remembering, or making decisions?: No  Permission Sought/Granted                   Emotional Assessment Appearance:: Appears stated age Attitude/Demeanor/Rapport: Gracious, Engaged Affect (typically observed): Accepting Orientation: : Oriented to Self, Oriented to Place, Oriented to  Time, Oriented to Situation Alcohol  / Substance Use: Not Applicable Psych Involvement: No (comment)  Admission diagnosis:  Unstable angina (HCC) [I20.0] Patient Active Problem List   Diagnosis Date Noted   SVT (supraventricular tachycardia) 11/08/2023   Paroxysmal atrial fibrillation (HCC) 11/07/2023   CKD (chronic kidney disease), stage III (HCC) 11/07/2023   Pleural effusion 11/07/2023   S/P TAVR (transcatheter aortic valve replacement) 11/04/2023   Congestive heart failure (HCC) 10/29/2023   Acute systolic heart failure (HCC) 10/25/2023   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 06/09/2016   Non-ST elevation (NSTEMI) myocardial infarction [facility]) 06/08/2016   PVD (peripheral vascular disease)    Diabetes mellitus (HCC)    Coronary artery disease with hx CABG    HLD (hyperlipidemia)    PCP:  [facility] Pharmacy:   [facility] - [place], [place] - [address] [place] [address] Phone: [phone]  Fax: [phone]  [id number] [place], [place] - [address] AT [facility] [address] [place] [address] Phone: [phone] Fax: [phone]  [facility] [address] [place] [address] Phone: [phone] Fax: [phone]     Social Drivers of Health (SDOH) Social  History: SDOH Screenings   Food Insecurity: No Food Insecurity (02/25/2024)  Housing: Low Risk  (02/25/2024)  Transportation Needs: No Transportation Needs (02/25/2024)  Utilities: Not At Risk (02/25/2024)  Depression (PHQ2-9): Low Risk  (07/16/2020)  Social Connections: Socially Isolated (02/25/2024)  Tobacco Use: Medium Risk (02/24/2024)   SDOH Interventions:     Readmission Risk Interventions    11/08/2023   11:38 AM 11/02/2023   12:38 PM  Readmission Risk Prevention Plan  Transportation Screening  Complete  PCP or Specialist Appt within 3-5 Days  Complete  HRI or Home Care Consult  Complete  Social Work Consult for Recovery Care Planning/Counseling Complete   Palliative Care Screening  Not Applicable  Medication Review Ocean[employer])  Complete

## 2024-02-27 NOTE — Interval H&P Note (Signed)
 History and Physical Interval Note:  02/27/2024 3:15 PM  Luke Harvey  has presented today for surgery, with the diagnosis of NSTEMI.  The various methods of treatment have been discussed with the patient and family. After consideration of risks, benefits and other options for treatment, the patient has consented to  Procedure(s): LEFT HEART CATH AND CORS/GRAFTS ANGIOGRAPHY (N/A) as a surgical intervention.  The patient's history has been reviewed, patient examined, no change in status, stable for surgery.  I have reviewed the patient's chart and labs.  Questions were answered to the patient's satisfaction.    Cath Lab Visit (complete for each Cath Lab visit)  Clinical Evaluation Leading to the Procedure:   ACS: Yes.    Non-ACS:  N/A  Levonne Carreras

## 2024-02-27 NOTE — Plan of Care (Signed)
  Problem: Education: Goal: Knowledge of General Education information will improve Description: Including pain rating scale, medication(s)/side effects and non-pharmacologic comfort measures Outcome: Progressing   Problem: Health Behavior/Discharge Planning: Goal: Ability to manage health-related needs will improve Outcome: Progressing   Problem: Clinical Measurements: Goal: Ability to maintain clinical measurements within normal limits will improve Outcome: Progressing Goal: Will remain free from infection Outcome: Progressing Goal: Diagnostic test results will improve Outcome: Progressing Goal: Respiratory complications will improve Outcome: Progressing Goal: Cardiovascular complication will be avoided Outcome: Progressing   Problem: Activity: Goal: Risk for activity intolerance will decrease Outcome: Progressing   Problem: Nutrition: Goal: Adequate nutrition will be maintained Outcome: Progressing   Problem: Coping: Goal: Level of anxiety will decrease Outcome: Progressing   Problem: Elimination: Goal: Will not experience complications related to bowel motility Outcome: Progressing Goal: Will not experience complications related to urinary retention Outcome: Progressing   Problem: Pain Managment: Goal: General experience of comfort will improve and/or be controlled Outcome: Progressing   Problem: Safety: Goal: Ability to remain free from injury will improve Outcome: Progressing   Problem: Skin Integrity: Goal: Risk for impaired skin integrity will decrease Outcome: Progressing   Problem: Education: Goal: Understanding of CV disease, CV risk reduction, and recovery process will improve Outcome: Progressing Goal: Individualized Educational Video(s) Outcome: Progressing   Problem: Activity: Goal: Ability to return to baseline activity level will improve Outcome: Progressing   Problem: Cardiovascular: Goal: Ability to achieve and maintain adequate  cardiovascular perfusion will improve Outcome: Progressing Goal: Vascular access site(s) Level 0-1 will be maintained Outcome: Progressing

## 2024-02-27 NOTE — Progress Notes (Signed)
  Progress Note  Patient Name: Luke Harvey Date of Encounter: 02/27/2024 [facility] Cardiologist: Lonni LITTIE Nanas, MD   Interval Summary   No chest pain or shortness of breath overnight.  States that symptoms resolved since arriving in the hospital and being started on IV heparin .  He described chest discomfort radiating down the back of both arms prompting his hospitalization.  Vital Signs Vitals:   02/26/24 1927 02/26/24 2229 02/27/24 0000 02/27/24 0400  BP: 127/74 132/81 125/74 129/70  Pulse: (!) 101 99 95 93  Resp: 20 18 20 15   Temp: 98.5 F (36.9 C) 98.4 F (36.9 C)  98.3 F (36.8 C)  TempSrc: Oral Oral  Oral  SpO2: 99% 98% 97% 96%  Weight:      Height:        Intake/Output Summary (Last 24 hours) at 02/27/2024 0616 Last data filed at 02/27/2024 0431 Gross per 24 hour  Intake 1158.42 ml  Output 1325 ml  Net -166.58 ml      02/24/2024    9:49 PM 12/22/2023    1:25 PM 12/07/2023    3:29 PM  Last 3 Weights  Weight (lbs) 136 lb 0.4 oz 136 lb 133 lb 9.6 oz  Weight (kg) 61.7 kg 61.689 kg 60.6 kg      Telemetry/ECG  Sinus rhythm with PACs- Personally Reviewed  Physical Exam  GEN: No acute distress.   Neck: No JVD Cardiac: RRR, 1/6 systolic ejection murmur at the right upper sternal border Respiratory: Clear to auscultation bilaterally. GI: Soft, nontender, non-distended  MS: No edema.  Left radial pulse 2+  Assessment & Plan  NSTEMI: complex cardiac hx with CABG, multiple PCI procedures. He last underwent SVG-OM intervention in 09/2023. Ruled in for NSTEMI with elevated HS-Trop to 160. Continue IV heparin . Plans for cardiac cath and possible PCI, consent obtained.  Chest pain-free since arrival on IV heparin .  Patient on aspirin , clopidogrel , metoprolol  succinate, and rosuvastatin .  Patient with 2+ left radial pulse and previous catheterization from left radial approach.  High likelihood that he has recurrent in-stent restenosis/thrombotic lesion  in SVG to OM. Reviewed old notes - he had significant dyspnea that improved after changing ticagrelor --->clopidogrel . Mixed hyperlipidemia: on rosuvastatin  40 mg daily. Aortic stenosis s/p TAVR: normal TAVR function on echo 12/22/23 with trivial PVL and mean gradient 5 mmHg.  Paroxysmal atrial fibrillation: Has been on apixaban  2.5 mg twice daily. Dispo: pending cardiac cath result.   For questions or updates, please contact [facility] Please consult [email or web address] for contact info under         Signed, [name], MD

## 2024-02-27 NOTE — Progress Notes (Signed)
 Mobility Specialist Progress Note;   02/27/24 0944  Mobility  Activity Ambulated with assistance  Level of Assistance Contact guard assist, steadying assist  Assistive Device Cane  Distance Ambulated (ft) 100 ft  Activity Response Tolerated well  Mobility Referral Yes  Mobility visit 1 Mobility  Mobility Specialist Start Time (ACUTE ONLY) 0944  Mobility Specialist Stop Time (ACUTE ONLY) 0957  Mobility Specialist Time Calculation (min) (ACUTE ONLY) 13 min   Pt agreeable to mobility. On RA upon arrival. Required MinG assistance to safely ambulate w/ cane. VSS on RA when able to receive accurate pleth. No c/o SOB when asked. Pt returned back to chair and left with all needs met, call bell in reach.   Lauraine Erm Mobility Specialist Please contact via SecureChat or [employer] [phone]

## 2024-02-27 NOTE — TOC Progression Note (Signed)
 Transition of Care [facility]) - Progression Note    Patient Details  Name: Luke Harvey MRN: [id number] Date of Birth: Nov 06, 1945  [facility] CM/SW Contact  Lauraine FORBES Saa, LCSWA Phone Number: 02/27/2024, 3:29 PM  Clinical Narrative:     3:29 PM CSW provided SDOH (social connections) resources.  Expected Discharge Plan: Home/Self Care Barriers to Discharge: Continued Medical Work up               Expected Discharge Plan and Services   Discharge Planning Services: CM Consult   Living arrangements for the past 2 months: Single Family Home                                       Social Drivers of Health (SDOH) Interventions SDOH Screenings   Food Insecurity: No Food Insecurity (02/25/2024)  Housing: Low Risk  (02/25/2024)  Transportation Needs: No Transportation Needs (02/25/2024)  Utilities: Not At Risk (02/25/2024)  Depression (PHQ2-9): Low Risk  (07/16/2020)  Social Connections: Socially Isolated (02/25/2024)  Tobacco Use: Medium Risk (02/24/2024)    Readmission Risk Interventions    11/08/2023   11:38 AM 11/02/2023   12:38 PM  Readmission Risk Prevention Plan  Transportation Screening  Complete  PCP or Specialist Appt within 3-5 Days  Complete  HRI or Home Care Consult  Complete  Social Work Consult for Recovery Care Planning/Counseling Complete   Palliative Care Screening  Not Applicable  Medication Review Oceanographer)  Complete

## 2024-02-27 NOTE — Progress Notes (Signed)
 PHARMACY - ANTICOAGULATION CONSULT NOTE  Pharmacy Consult for heparin  Indication: chest pain/ACS  Allergies  Allergen Reactions   Tetracyclines & Related Hives and Itching   Cozaar [Losartan] Cough   Zestril  [Lisinopril ] Cough   Lipitor [Atorvastatin] Other (See Comments)    Myalgias     Patient Measurements: Height: 5' 6 (167.6 cm) Weight: 61.7 kg (136 lb 0.4 oz) IBW/kg (Calculated) : 63.8 HEPARIN  DW (KG): 61.7  Vital Signs: Temp: 98.2 F (36.8 C) (11/03 0740) Temp Source: Oral (11/03 0740) BP: 108/74 (11/03 0740) Pulse Rate: 84 (11/03 0740)  Labs: Recent Labs    02/24/24 2159 02/25/24 0012 02/25/24 0435 02/25/24 1000 02/25/24 2040 02/26/24 0923 02/27/24 0154  HGB 11.3*  --  10.8*  --   --  11.5* 11.5*  HCT 35.7*  --  33.5*  --   --  34.7* 35.3*  PLT 286  --  259  --   --  263 269  APTT  --   --   --    < > 64* 86* 77*  LABPROT  --   --  16.7*  --   --   --   --   INR  --   --  1.3*  --   --   --   --   HEPARINUNFRC  --   --   --    < > 0.57 0.48 0.27*  CREATININE 1.38*  --  1.28*  --   --   --   --   TROPONINIHS 167* 157*  --   --   --   --   --    < > = values in this interval not displayed.    Estimated Creatinine Clearance: 40.2 mL/min (A) (by C-G formula based on SCr of 1.28 mg/dL (H)).   Medical History: Past Medical History:  Diagnosis Date   Age-related macular degeneration, dry, both eyes    Anxiety    Aortic stenosis    Arthritis    probably all over (06/08/2016)   Cardiomyopathy (HCC)    a. EF 40-45% in 2018, normalized in 03/2020.   Chronic back pain    started in my lower back; going up my back in the last couple months (06/08/2016)   Chronic sinusitis    Coronary artery disease    a. s/p CABGx 2V (2014 in [place])  b. PCI (2015 in [place]). c. PCI 2018 ([facility]). d. PCI 03/2020 (complex - [facility]).   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 06/09/2016   NSTEMI (non-ST elevated myocardial infarction) (HCC) 06/08/2016   Pneumonia    3-4 times maybe (06/08/2016)   PVD (peripheral vascular disease)    Recovering alcoholic (HCC)    picked up my 30 year chip the other day (06/08/2016)   S/P angioplasty with stent, 06/08/16 DES, for in-stent restenosis in RCA. 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 Wonda and Dr. Shyrl.   Sleep apnea  got a mask after OR; don't use it (06/08/2016)   Type II diabetes mellitus (HCC)     Medications:  No current facility-administered medications on file prior to encounter.   Current Outpatient Medications on File Prior to Encounter  Medication Sig Dispense Refill   acetaminophen  (TYLENOL ) 500 MG tablet Take 500 mg by mouth 2 (two) times daily.     amoxicillin  (AMOXIL ) 500 MG tablet Take 4 tablets (2,000 mg total) by mouth as directed. 1 hour prior to dental work including cleanings 12 tablet 12   apixaban  (ELIQUIS ) 2.5 MG TABS tablet Take 1 tablet (2.5 mg total) by mouth 2 (two) times daily. 60 tablet 2   BIOTIN PO Take 1 tablet by mouth daily at 12 noon.     capsaicin (ZOSTRIX) 0.025 % cream Apply 1 Application topically in the morning, at noon, and at bedtime.     carboxymethylcellulose (REFRESH PLUS) 0.5 % SOLN Place 1 drop into both eyes See admin instructions. Administer 1 drop into each eye every daily. May use every 12 hours if needed for dry eyes.     Cholecalciferol (VITAMIN D-3 PO) Take 1 tablet by mouth daily at 12 noon.     clopidogrel  (PLAVIX ) 75 MG tablet Take 1 tablet (75 mg total) by mouth daily. 90 tablet 2    Coenzyme Q10 (COQ10 PO) Take 1 capsule by mouth daily at 12 noon.     Cyanocobalamin (VITAMIN B12 PO) Take 1 tablet by mouth daily at 12 noon.     diclofenac Sodium (VOLTAREN) 1 % GEL 1 Application every 6 (six) hours as needed for pain.     DULoxetine  (CYMBALTA ) 30 MG capsule Take 30 mg by mouth at bedtime.     empagliflozin  (JARDIANCE ) 10 MG TABS tablet Take 1 tablet (10 mg total) by mouth daily.     fluticasone  (FLONASE ) 50 MCG/ACT nasal spray Place 1-2 sprays into both nostrils daily as needed for allergies or rhinitis.     ketoconazole (NIZORAL) 2 % cream Apply 1 Application topically 2 (two) times daily as needed for irritation.     ketoconazole (NIZORAL) 2 % shampoo Apply 1 Application topically every 3 (three) days.     levOCARNitine (L-CARNITINE PO) Take 2 tablets by mouth daily at 12 noon.     levothyroxine  (SYNTHROID ) 75 MCG tablet Take 75 mcg by mouth daily before breakfast.     lidocaine  (LIDODERM ) 5 % Place 1 patch onto the skin daily as needed (pain).     loratadine  (CLARITIN ) 10 MG tablet Take 10 mg by mouth daily at 12 noon.     MAGNESIUM  PO Take 2 tablets by mouth daily at 12 noon.     metFORMIN  (GLUCOPHAGE ) 500 MG tablet Take 1,000 mg by mouth 2 (two) times daily as needed (BS > 140). (Patient taking differently: Take 1,000 mg by mouth 2 (two) times daily as needed (BS > 140). Only taking in the morning)     methocarbamol (ROBAXIN) 500 MG tablet Take 500 mg by mouth at bedtime.     metoprolol  succinate (TOPROL -XL) 25 MG 24 hr tablet Take 1 tablet (25 mg total) by mouth at bedtime. 90 tablet 1   MILK THISTLE PO Take 1 tablet by mouth daily at 12 noon.     Multiple Vitamins-Minerals (PRESERVISION AREDS 2) CAPS Take 1 capsule by mouth 2 (two) times daily.     nitroGLYCERIN  (NITROSTAT ) 0.4 MG SL tablet Place 1 tablet (0.4 mg total) under the tongue See admin instructions. Take 1  tablet (0.4) mg by mouth every day. May take an additional 1 tablet later in the day if  chest pain.      OVER THE COUNTER MEDICATION Take 1 capsule by mouth daily at 12 noon. VisiUltra     OVER THE COUNTER MEDICATION Take 1 capsule by mouth daily at 12 noon. blood boost supplement for glucose control     Probiotic Product (PROBIOTIC PO) Take 2 capsules by mouth daily at 12 noon.     QUERCETIN PO Take 1 tablet by mouth daily at 12 noon.     rosuvastatin  (CRESTOR ) 40 MG tablet Take 1 tablet (40 mg total) by mouth daily. 30 tablet 11   SELENIUM PO Take 1 tablet by mouth daily at 12 noon.     spironolactone  (ALDACTONE ) 25 MG tablet Take 0.5 tablets (12.5 mg total) by mouth daily. 90 tablet 0     Assessment: 78 y.o. male presenting with CP, hx of CAD s/p CABG, he is on Eliquis  PTA for afib, heparin  started on admission  Heparin  level low, correlating with aPTT, CBc ok, cath later.  Goal of Therapy:  aPTT 66-102 sec Heparin  level 0.3-0.7 units/ml Monitor platelets by anticoagulation protocol: Yes   Plan:  -Increase heparin  to 1150 units/h -Daily heparin  level and CBC  Ozell Jamaica, PharmD, BCPS, [facility] Clinical Pharmacist [phone] Please check AMION for all [facility] Pharmacy numbers 02/27/2024

## 2024-02-27 NOTE — Progress Notes (Addendum)
 PHARMACY - ANTICOAGULATION CONSULT NOTE  Pharmacy Consult for heparin  Indication: chest pain/ACS  Allergies  Allergen Reactions   Tetracyclines & Related Hives and Itching   Cozaar [Losartan] Cough   Zestril  [Lisinopril ] Cough   Lipitor [Atorvastatin] Other (See Comments)    Myalgias     Patient Measurements: Height: 5' 6 (167.6 cm) Weight: 61.7 kg (136 lb 0.4 oz) IBW/kg (Calculated) : 63.8 HEPARIN  DW (KG): 61.7  Vital Signs: Temp: 98 F (36.7 C) (11/03 1232) Temp Source: Oral (11/03 1232) BP: 129/68 (11/03 1617) Pulse Rate: 86 (11/03 1617)  Labs: Recent Labs    02/24/24 2159 02/25/24 0012 02/25/24 0435 02/25/24 1000 02/25/24 2040 02/26/24 0923 02/27/24 0154 02/27/24 1029  HGB 11.3*  --  10.8*  --   --  11.5* 11.5*  --   HCT 35.7*  --  33.5*  --   --  34.7* 35.3*  --   PLT 286  --  259  --   --  263 269  --   APTT  --   --   --    < > 64* 86* 77*  --   LABPROT  --   --  16.7*  --   --   --   --   --   INR  --   --  1.3*  --   --   --   --   --   HEPARINUNFRC  --   --   --    < > 0.57 0.48 0.27*  --   CREATININE 1.38*  --  1.28*  --   --   --   --  1.34*  TROPONINIHS 167* 157*  --   --   --   --   --   --    < > = values in this interval not displayed.    Estimated Creatinine Clearance: 38.4 mL/min (A) (by C-G formula based on SCr of 1.34 mg/dL (H)).   Medical History: Past Medical History:  Diagnosis Date   Age-related macular degeneration, dry, both eyes    Anxiety    Aortic stenosis    Arthritis    probably all over (06/08/2016)   Cardiomyopathy (HCC)    a. EF 40-45% in 2018, normalized in 03/2020.   Chronic back pain    started in my lower back; going up my back in the last couple months (06/08/2016)   Chronic sinusitis    Coronary artery disease    a. s/p CABGx 2V (2014 in [place])  b. PCI (2015 in [place]). c. PCI 2018 ([facility]). d. PCI 03/2020 (complex - [facility]).   Diabetic peripheral neuropathy (HCC)    DVT (deep venous thrombosis) (HCC)     left groin; it was there when I had bypass OR; get it checked q yr; still there now (06/08/2016)   GERD (gastroesophageal reflux disease)    Hepatitis B    History of bleeding ulcers    History of diverticulitis    History of hiatal hernia    HLD (hyperlipidemia)    HTN (hypertension)    LV dysfunction, post MI EF 40-45%. 06/09/2016   NSTEMI (non-ST elevated myocardial infarction) (HCC) 06/08/2016   Pneumonia    3-4 times maybe (06/08/2016)   PVD (peripheral vascular disease)    Recovering alcoholic (HCC)    picked up my 30 year chip the other day (06/08/2016)   S/P angioplasty with stent, 06/08/16 DES, for in-stent restenosis in RCA. 06/09/2016   S/P TAVR (transcatheter aortic  valve replacement) 11/04/2023   s/p TAVR wtih a 23 mm Edwards S3UR via the TF approach by Dr. Wonda and Dr. Shyrl.   Sleep apnea    got a mask after OR; don't use it (06/08/2016)   Type II diabetes mellitus (HCC)     Medications:  No current facility-administered medications on file prior to encounter.   Current Outpatient Medications on File Prior to Encounter  Medication Sig Dispense Refill   acetaminophen  (TYLENOL ) 500 MG tablet Take 500 mg by mouth 2 (two) times daily.     amoxicillin  (AMOXIL ) 500 MG tablet Take 4 tablets (2,000 mg total) by mouth as directed. 1 hour prior to dental work including cleanings 12 tablet 12   apixaban  (ELIQUIS ) 2.5 MG TABS tablet Take 1 tablet (2.5 mg total) by mouth 2 (two) times daily. 60 tablet 2   BIOTIN PO Take 1 tablet by mouth daily at 12 noon.     capsaicin (ZOSTRIX) 0.025 % cream Apply 1 Application topically in the morning, at noon, and at bedtime.     carboxymethylcellulose (REFRESH PLUS) 0.5 % SOLN Place 1 drop into both eyes See admin instructions. Administer 1 drop into each eye every daily. May use every 12 hours if needed for dry eyes.     Cholecalciferol (VITAMIN D-3 PO) Take 1 tablet by mouth daily at 12 noon.     clopidogrel  (PLAVIX ) 75 MG  tablet Take 1 tablet (75 mg total) by mouth daily. 90 tablet 2   Coenzyme Q10 (COQ10 PO) Take 1 capsule by mouth daily at 12 noon.     Cyanocobalamin (VITAMIN B12 PO) Take 1 tablet by mouth daily at 12 noon.     diclofenac Sodium (VOLTAREN) 1 % GEL 1 Application every 6 (six) hours as needed for pain.     DULoxetine  (CYMBALTA ) 30 MG capsule Take 30 mg by mouth at bedtime.     empagliflozin  (JARDIANCE ) 10 MG TABS tablet Take 1 tablet (10 mg total) by mouth daily.     fluticasone  (FLONASE ) 50 MCG/ACT nasal spray Place 1-2 sprays into both nostrils daily as needed for allergies or rhinitis.     ketoconazole (NIZORAL) 2 % cream Apply 1 Application topically 2 (two) times daily as needed for irritation.     ketoconazole (NIZORAL) 2 % shampoo Apply 1 Application topically every 3 (three) days.     levOCARNitine (L-CARNITINE PO) Take 2 tablets by mouth daily at 12 noon.     levothyroxine  (SYNTHROID ) 75 MCG tablet Take 75 mcg by mouth daily before breakfast.     lidocaine  (LIDODERM ) 5 % Place 1 patch onto the skin daily as needed (pain).     loratadine  (CLARITIN ) 10 MG tablet Take 10 mg by mouth daily at 12 noon.     MAGNESIUM  PO Take 2 tablets by mouth daily at 12 noon.     metFORMIN  (GLUCOPHAGE ) 500 MG tablet Take 1,000 mg by mouth 2 (two) times daily as needed (BS > 140). (Patient taking differently: Take 1,000 mg by mouth 2 (two) times daily as needed (BS > 140). Only taking in the morning)     methocarbamol (ROBAXIN) 500 MG tablet Take 500 mg by mouth at bedtime.     metoprolol  succinate (TOPROL -XL) 25 MG 24 hr tablet Take 1 tablet (25 mg total) by mouth at bedtime. 90 tablet 1   MILK THISTLE PO Take 1 tablet by mouth daily at 12 noon.     Multiple Vitamins-Minerals (PRESERVISION AREDS 2) CAPS Take 1 capsule  by mouth 2 (two) times daily.     nitroGLYCERIN  (NITROSTAT ) 0.4 MG SL tablet Place 1 tablet (0.4 mg total) under the tongue See admin instructions. Take 1 tablet (0.4) mg by mouth every day. May  take an additional 1 tablet later in the day if  chest pain.     OVER THE COUNTER MEDICATION Take 1 capsule by mouth daily at 12 noon. VisiUltra     OVER THE COUNTER MEDICATION Take 1 capsule by mouth daily at 12 noon. blood boost supplement for glucose control     Probiotic Product (PROBIOTIC PO) Take 2 capsules by mouth daily at 12 noon.     QUERCETIN PO Take 1 tablet by mouth daily at 12 noon.     rosuvastatin  (CRESTOR ) 40 MG tablet Take 1 tablet (40 mg total) by mouth daily. 30 tablet 11   SELENIUM PO Take 1 tablet by mouth daily at 12 noon.     spironolactone  (ALDACTONE ) 25 MG tablet Take 0.5 tablets (12.5 mg total) by mouth daily. 90 tablet 0     Assessment: 78 y.o. male presenting with CP, hx of CAD s/p CABG, he is on Eliquis  PTA for afib, heparin  started on admission  Patient underwent cardiac cath on 11/3 - severe native CAD similar to last cath. Plan to restart heparin  2 hours after TR band removed. Was previously ordered for heparin  infusion at 1150 units/hr.   Goal of Therapy:  aPTT 66-102 sec Heparin  level 0.3-0.7 units/ml Monitor platelets by anticoagulation protocol: Yes   Plan:  -Restart heparin  infusion at 1150 units/hr 2 hours after TR band removed -Will order heparin  level 8 hours after restart  -Daily heparin  level and CBC -F/u cardiology plans to restart DOAC  Thank you for allowing pharmacy to participate in this patient's care,  Suzen Sour, PharmD, BCCCP Clinical Pharmacist  Phone: [phone] 02/27/2024 5:26 PM  Please check AMION for all [facility] Pharmacy phone numbers After 10:00 PM, call Main Pharmacy [phone]

## 2024-02-27 NOTE — Progress Notes (Signed)
 1 PROGRESS NOTE    ADISA LITT  FMW:[id number] DOB: 01-18-1946 DOA: 02/24/2024 PCP: [facility]  Outpatient Specialists:     Brief Narrative:  Patient is an 78 year old male with past medical history significant for coronary artery disease s/p 2v CABG (LIMA to LAD, , aortic stenosis s/p TAVR in 11/04/2023, TDM-II, pAF, CKD, hyperlipidemia, and paroxysmal SVT.  Patient was admitted with chest pain.  Elevated troponin (167-157).  CTA chest was negative for pulmonary embolism.  EKG revealed probable LVH with repolarization.  Patient is on heparin  drip.  Cardiology team is assisting in directing patient's care.  02/25/2024: Patient seen.  No chest pain at the moment. 02/26/2024: Patient seen alongside patient's daughter.  No chest pain today.  Cardiology team plans cardiac catheterization tomorrow.  Episode of chest pain radiating to both upper extremities reported last night. 03/25/2024: Patient has remained chest pain-free.  For cardiac catheterization today.  Further management will depend on results of the cardiac catheterization.  Assessment & Plan:   Principal Problem:   Unstable angina (HCC) Active Problems:   Non-ST elevation (NSTEMI) myocardial infarction [facility])   Chest pain/unstable angina: - History of two-vessel CABG. - Patient has remained chest pain-free.   - Continue heparin  drip.  . - Continue Plavix , Toprol  XL, Crestor  and Aldactone . - For cardiac catheterization today.  Hypothyroidism: - Continue levothyroxine .  Depression: - Continue Cymbalta .  Type 2 DM with hyperglycemia  Chronic kidney disease stage IIIa: - Stable.  DVT prophylaxis: Patient is on heparin  drip. Code Status: Full code Family Communication:  Disposition Plan: This will depend on hospital course   Consultants:  Cardiology  Procedures:  None  Antimicrobials:  None   Subjective: No chest pain  No shortness of breath  Objective: Vitals:   02/27/24 0400 02/27/24  0740 02/27/24 1232 02/27/24 1508  BP: 129/70 108/74 129/82   Pulse: 93 84 91   Resp: 15 18 19    Temp: 98.3 F (36.8 C) 98.2 F (36.8 C) 98 F (36.7 C)   TempSrc: Oral Oral Oral   SpO2: 96% 93% 91% 97%  Weight:      Height:        Intake/Output Summary (Last 24 hours) at 02/27/2024 1544 Last data filed at 02/27/2024 1500 Gross per 24 hour  Intake 1148.09 ml  Output 1150 ml  Net -1.91 ml   Filed Weights   02/24/24 2149  Weight: 61.7 kg    Examination:  General exam: Appears calm and comfortable  Respiratory system: Clear to auscultation.  Cardiovascular system: S1 & S2 heard Gastrointestinal system: Abdomen is soft and nontender.  Central nervous system: Alert and oriented.  Extremities: No leg edema.  Data Reviewed: I have personally reviewed following labs and imaging studies  CBC: Recent Labs  Lab 02/24/24 2159 02/25/24 0435 02/26/24 0923 02/27/24 0154  WBC 8.1 8.5 9.7 7.5  HGB 11.3* 10.8* 11.5* 11.5*  HCT 35.7* 33.5* 34.7* 35.3*  MCV 85.6 84.0 81.6 83.3  PLT 286 259 263 269   Basic Metabolic Panel: Recent Labs  Lab 02/24/24 2159 02/25/24 0435 02/27/24 1029  NA 135 134* 136  K 3.8 3.9 4.0  CL 99 99 99  CO2 23 20* 22  GLUCOSE 196* 169* 129*  BUN 19 20 25*  CREATININE 1.38* 1.28* 1.34*  CALCIUM  8.9 8.7* 9.1  PHOS  --   --  3.8   GFR: Estimated Creatinine Clearance: 38.4 mL/min (A) (by C-G formula based on SCr of 1.34 mg/dL (H)). Liver Function Tests:  Recent Labs  Lab 02/27/24 1029  ALBUMIN 3.0*   No results for input(s): LIPASE, AMYLASE in the last 168 hours. No results for input(s): AMMONIA in the last 168 hours. Coagulation Profile: Recent Labs  Lab 02/25/24 0435  INR 1.3*   Cardiac Enzymes: No results for input(s): CKTOTAL, CKMB, CKMBINDEX, TROPONINI in the last 168 hours. BNP (last 3 results) No results for input(s): PROBNP in the last 8760 hours. HbA1C: No results for input(s): HGBA1C in the last 72  hours. CBG: No results for input(s): GLUCAP in the last 168 hours. Lipid Profile: Recent Labs    02/27/24 0154  CHOL 124  HDL 38*  LDLCALC 67  TRIG 97  CHOLHDL 3.3   Thyroid Function Tests: No results for input(s): TSH, T4TOTAL, FREET4, T3FREE, THYROIDAB in the last 72 hours. Anemia Panel: Recent Labs    02/25/24 0238  TIBC 259  IRON 21*   Urine analysis:    Component Value Date/Time   COLORURINE YELLOW 02/24/2024 2203   APPEARANCEUR CLEAR 02/24/2024 2203   LABSPEC 1.029 02/24/2024 2203   PHURINE 5.0 02/24/2024 2203   GLUCOSEU >=500 (A) 02/24/2024 2203   HGBUR NEGATIVE 02/24/2024 2203   BILIRUBINUR NEGATIVE 02/24/2024 2203   KETONESUR NEGATIVE 02/24/2024 2203   PROTEINUR NEGATIVE 02/24/2024 2203   NITRITE NEGATIVE 02/24/2024 2203   LEUKOCYTESUR NEGATIVE 02/24/2024 2203   Sepsis Labs: @LABRCNTIP (procalcitonin:4,lacticidven:4)  ) Recent Results (from the past 240 hours)  MRSA Next Gen by PCR, Nasal     Status: Abnormal   Collection Time: 02/25/24  2:47 PM   Specimen: Nasal Mucosa; Nasal Swab  Result Value Ref Range Status   MRSA by PCR Next Gen DETECTED (A) NOT DETECTED Final    Comment: RESULT CALLED TO, READ BACK BY AND VERIFIED WITH: RN AMINA M. 1101 AT 1349, ADC (NOTE) The GeneXpert MRSA Assay (FDA approved for NASAL specimens only), is one component of a comprehensive MRSA colonization surveillance program. It is not intended to diagnose MRSA infection nor to guide or monitor treatment for MRSA infections. Test performance is not FDA approved in patients less than 83 years old. Performed at [facility], [address] [place], [place] [address]          Radiology Studies: No results found.       Scheduled Meds:  [MAR Hold] clopidogrel   75 mg Oral Daily   [MAR Hold] DULoxetine   30 mg Oral QHS   [MAR Hold] levothyroxine   75 mcg Oral QAC breakfast   [MAR Hold] metoprolol  succinate  25 mg Oral QHS   [MAR Hold] mupirocin   ointment  1 Application Nasal BID   [MAR Hold] rosuvastatin   40 mg Oral Daily   [MAR Hold] spironolactone   12.5 mg Oral Daily   Continuous Infusions:  heparin  Stopped (02/27/24 1452)     LOS: 2 days    Time spent: 35 minutes    Leatrice Chapel, MD  [facility] Pager #: [phone] 7PM-7AM contact night coverage as above

## 2024-02-27 NOTE — Discharge Instructions (Signed)

## 2024-02-28 ENCOUNTER — Inpatient Hospital Stay (HOSPITAL_COMMUNITY)

## 2024-02-28 ENCOUNTER — Other Ambulatory Visit (HOSPITAL_COMMUNITY): Payer: Self-pay

## 2024-02-28 ENCOUNTER — Encounter (HOSPITAL_COMMUNITY): Payer: Self-pay | Admitting: Internal Medicine

## 2024-02-28 DIAGNOSIS — I214 Non-ST elevation (NSTEMI) myocardial infarction: Secondary | ICD-10-CM

## 2024-02-28 LAB — BASIC METABOLIC PANEL WITH GFR
Anion gap: 13 (ref 5–15)
BUN: 25 mg/dL — ABNORMAL HIGH (ref 8–23)
CO2: 23 mmol/L (ref 22–32)
Calcium: 9.1 mg/dL (ref 8.9–10.3)
Chloride: 99 mmol/L (ref 98–111)
Creatinine, Ser: 1.27 mg/dL — ABNORMAL HIGH (ref 0.61–1.24)
GFR, Estimated: 57 mL/min — ABNORMAL LOW (ref >60)
Glucose, Bld: 139 mg/dL — ABNORMAL HIGH (ref 70–99)
Potassium: 4.6 mmol/L (ref 3.5–5.1)
Sodium: 135 mmol/L (ref 135–145)

## 2024-02-28 LAB — CBC
HCT: 34.4 % — ABNORMAL LOW (ref 39.0–52.0)
Hemoglobin: 11.3 g/dL — ABNORMAL LOW (ref 13.0–17.0)
MCH: 26.9 pg (ref 26.0–34.0)
MCHC: 32.8 g/dL (ref 30.0–36.0)
MCV: 81.9 fL (ref 80.0–100.0)
Platelets: 289 K/uL (ref 150–400)
RBC: 4.2 MIL/uL — ABNORMAL LOW (ref 4.22–5.81)
RDW: 13.2 % (ref 11.5–15.5)
WBC: 7 K/uL (ref 4.0–10.5)
nRBC: 0 % (ref 0.0–0.2)

## 2024-02-28 LAB — ECHOCARDIOGRAM LIMITED
Area-P 1/2: 4.6 cm2
Height: 66 in
S' Lateral: 4 cm
Weight: 2176.38 [oz_av]

## 2024-02-28 LAB — HEPARIN LEVEL (UNFRACTIONATED): Heparin Unfractionated: 0.24 [IU]/mL — ABNORMAL LOW (ref 0.30–0.70)

## 2024-02-28 MED ORDER — POLYETHYLENE GLYCOL 3350 17 G PO PACK: 17.0000 g | PACK | Freq: Every day | ORAL | Status: DC

## 2024-02-28 MED ORDER — APIXABAN 2.5 MG PO TABS
2.5000 mg | ORAL_TABLET | Freq: Two times a day (BID) | ORAL | Status: DC
  Administered 2024-02-28: 2.5 mg via ORAL
  Filled 2024-02-28: qty 1

## 2024-02-28 MED ORDER — ASPIRIN 81 MG PO CHEW
81.0000 mg | CHEWABLE_TABLET | Freq: Every day | ORAL | 0 refills | Status: AC
  Filled 2024-02-28: qty 7, 7d supply, fill #0

## 2024-02-28 NOTE — Progress Notes (Signed)
 Discharge Nurse Summary: DC order noted per MD. DC RN at bedside with patient. Patient agreeable with discharge plan, will drive self home. AVS printed/reviewed. PIVs removed, skin intact. No DME needs. No home meds. TOC meds delivered to the patient. CP/Edu resolved. Telemonitor returned to charging station. All belongings accounted for. Dressing to wound, CDI w/o bleeding or drainage. See LDAs. Patient wheeled downstairs for discharge by private auto.   Rosario EMERSON Lund, RN

## 2024-02-28 NOTE — TOC Transition Note (Signed)
 Transition of Care [facility]) - Discharge Note   Patient Details  Name: Luke Harvey MRN: [id number] Date of Birth: 04-01-1946  Transition of Care [facility]) CM/SW Contact:  Waddell Barnie Rama, RN Phone Number: 02/28/2024, 11:10 AM   Clinical Narrative:    For dc today, no needs.     Barriers to Discharge: Continued Medical Work up   Patient Goals and CMS Choice Patient states their goals for this hospitalization and ongoing recovery are:: return home          Discharge Placement                       Discharge Plan and Services Additional resources added to the After Visit Summary for     Discharge Planning Services: CM Consult                                 Social Drivers of Health (SDOH) Interventions SDOH Screenings   Food Insecurity: No Food Insecurity (02/25/2024)  Housing: Low Risk  (02/25/2024)  Transportation Needs: No Transportation Needs (02/25/2024)  Utilities: Not At Risk (02/25/2024)  Depression (PHQ2-9): Low Risk  (07/16/2020)  Social Connections: Socially Isolated (02/25/2024)  Tobacco Use: Medium Risk (02/24/2024)     Readmission Risk Interventions    11/08/2023   11:38 AM 11/02/2023   12:38 PM  Readmission Risk Prevention Plan  Transportation Screening  Complete  PCP or Specialist Appt within 3-5 Days  Complete  HRI or Home Care Consult  Complete  Social Work Consult for Recovery Care Planning/Counseling Complete   Palliative Care Screening  Not Applicable  Medication Review Oceanographer)  Complete

## 2024-02-28 NOTE — Progress Notes (Signed)
 Mobility Specialist Progress Note;   02/28/24 0901  Mobility  Activity Ambulated with assistance;Pivoted/transferred from bed to chair  Level of Assistance Standby assist, set-up cues, supervision of patient - no hands on  Assistive Device Cane  Distance Ambulated (ft) 150 ft  Activity Response Tolerated well  Mobility Referral Yes  Mobility visit 1 Mobility  Mobility Specialist Start Time (ACUTE ONLY) 0901  Mobility Specialist Stop Time (ACUTE ONLY) 0911  Mobility Specialist Time Calculation (min) (ACUTE ONLY) 10 min   Pt agreeable to mobility. Required no physical assistance during ambulation, SV for safety. VSS on RA. No c/o when asked. Agreeable to sit up in chair at [facility]. Pt left in chair with all needs met, call bell in reach.   Lauraine Erm Mobility Specialist Please contact via SecureChat or [employer] [phone]

## 2024-02-28 NOTE — Discharge Summary (Signed)
 Physician Discharge Summary  Luke Harvey FMW:[id number] DOB: 1945/04/28 DOA: 02/24/2024  PCP: [facility]  Admit date: 02/24/2024 Discharge date: 02/28/2024  Time spent: 45 minutes  Recommendations for Outpatient Follow-up:  [facility] in 2 weeks PCP in 1 week Please discontinue aspirin  after 1 week   Discharge Diagnoses:  Principal Problem:   Unstable angina (HCC) Active Problems:   Non-ST elevation (NSTEMI) myocardial infarction [facility]) Hypothyroidism CKD 3A Depression  Discharge Condition: Improved  Diet recommendation: Heart healthy  Filed Weights   02/24/24 2149  Weight: 61.7 kg    History of present illness:  78 year old male with past medical history significant for coronary artery disease s/p 2v CABG (LIMA to LAD, , aortic stenosis s/p TAVR in 11/04/2023, TDM-II, pAF, CKD, hyperlipidemia, and paroxysmal SVT. Patient was admitted with chest pain. Elevated troponin (167-157)   Hospital Course:  NSTEMI: s/p SVG-OM PCI yesterday found to have thrombotic 95% stenosis in the graft, treated with drug coated balloon angioplasty -resume low-dose apixaban . ASA x 1 week. Continue clopidogrel , metoprolol  succinate, and rosuvastatin . - Educated patient to discontinue aspirin  after 1 week  Mixed hyperlipidemia: Continue rosuvastatin  40 mg daily  Aortic stenosis status post TAVR: Normal TAVR prosthetic function demonstrated on most recent echocardiogram with mean gradient 5 mmHg and trivial paravalvular regurgitation   -Follow[facility]up [facility] cardiology at [facility]  Paroxysmal atrial fibrillation: Maintaining sinus rhythm on telemetry here.  Resume apixaban  2.5 mg twice daily.  Discharge Exam: Vitals:   02/28/24 0357 02/28/24 0810  BP: 135/73 116/67  Pulse: (!) 103 90  Resp: 20 20  Temp: 98.5 F (36.9 C)   SpO2: 90% 95%   Gen: Awake, Alert, Oriented X 3,  HEENT: no JVD Lungs: Good air movement bilaterally, CTAB CVS: S1S2/RRR Abd: soft, Non tender, non  distended, BS present Extremities: No edema Skin: no new rashes on exposed skin   Discharge Instructions   Discharge Instructions     Amb Referral to Cardiac Rehabilitation   Complete by: As directed    Diagnosis:  NSTEMI PTCA     After initial evaluation and assessments completed: Virtual Based Care may be provided alone or in conjunction with Phase 2 Cardiac Rehab based on patient barriers.: Yes   Intensive Cardiac Rehabilitation (ICR) [facility] location only OR Traditional Cardiac Rehabilitation (TCR) *If criteria for ICR are not met will enroll in TCR [facility] only): Yes   Diet - low sodium heart healthy   Complete by: As directed    Diet Carb Modified   Complete by: As directed    Increase activity slowly   Complete by: As directed       Allergies as of 02/28/2024       Reactions   Tetracyclines & Related Hives, Itching   Cozaar [losartan] Cough   Zestril  [lisinopril ] Cough   Lipitor [atorvastatin] Other (See Comments)   Myalgias         Medication List     STOP taking these medications    ezetimibe-simvastatin 10-10 MG tablet Commonly known as: VYTORIN   ketoconazole 2 % cream Commonly known as: NIZORAL   ketoconazole 2 % shampoo Commonly known as: NIZORAL   lidocaine  5 % Commonly known as: LIDODERM    predniSONE 20 MG tablet Commonly known as: DELTASONE       TAKE these medications    acetaminophen  500 MG tablet Commonly known as: TYLENOL  Take 1,000 mg by mouth 2 (two) times daily as needed for mild pain (pain score 1-3) or moderate pain (  pain score 4-6).   albuterol  108 (90 Base) MCG/ACT inhaler Commonly known as: VENTOLIN  HFA Inhale 2 puffs into the lungs every 4 (four) hours as needed for wheezing or shortness of breath.   ALPRAZolam  0.25 MG tablet Commonly known as: XANAX  Take 0.25 mg by mouth at bedtime as needed for anxiety.   amoxicillin  500 MG tablet Commonly known as: AMOXIL  Take 4 tablets (2,000 mg total) by mouth as directed. 1 hour  prior to dental work including cleanings   aspirin  81 MG chewable tablet Chew 1 tablet (81 mg total) by mouth daily for 7 days. Only for 1 week and then stop Start taking on: February 29, 2024   BIOTIN PO Take 1 tablet by mouth daily at 12 noon.   capsaicin 0.025 % cream Commonly known as: ZOSTRIX Apply 1 Application topically in the morning, at noon, and at bedtime.   carboxymethylcellulose 0.5 % Soln Commonly known as: REFRESH PLUS Place 1 drop into both eyes See admin instructions. Administer 1 drop into each eye every daily. May use every 12 hours if needed for dry eyes.   clopidogrel  75 MG tablet Commonly known as: PLAVIX  Take 1 tablet (75 mg total) by mouth daily.   COQ10 PO Take 1 capsule by mouth daily at 12 noon.   diclofenac Sodium 1 % Gel Commonly known as: VOLTAREN 1 Application every 6 (six) hours as needed for pain.   DULoxetine  30 MG capsule Commonly known as: CYMBALTA  Take 30 mg by mouth at bedtime.   Eliquis  2.5 MG Tabs tablet Generic drug: apixaban  Take 1 tablet (2.5 mg total) by mouth 2 (two) times daily. What changed: Another medication with the same name was removed. Continue taking this medication, and follow the directions you see here.   empagliflozin  10 MG Tabs tablet Commonly known as: JARDIANCE  Take 1 tablet (10 mg total) by mouth daily.   fluticasone  50 MCG/ACT nasal spray Commonly known as: FLONASE  Place 1-2 sprays into both nostrils daily as needed for allergies or rhinitis.   L-CARNITINE PO Take 2 tablets by mouth daily at 12 noon.   loratadine  10 MG tablet Commonly known as: CLARITIN  Take 10 mg by mouth daily at 12 noon.   MAGNESIUM  PO Take 2 tablets by mouth daily at 12 noon.   metFORMIN  1000 MG tablet Commonly known as: GLUCOPHAGE  Take 1,000 mg by mouth 2 (two) times daily with a meal. What changed: Another medication with the same name was removed. Continue taking this medication, and follow the directions you see here.    methocarbamol 500 MG tablet Commonly known as: ROBAXIN Take 500 mg by mouth at bedtime.   metoprolol  succinate 25 MG 24 hr tablet Commonly known as: TOPROL -XL Take 1 tablet (25 mg total) by mouth at bedtime.   MILK THISTLE PO Take 1 tablet by mouth daily at 12 noon.   mirtazapine 15 MG tablet Commonly known as: REMERON Take 7.5 mg by mouth at bedtime.   nitroGLYCERIN  0.4 MG SL tablet Commonly known as: NITROSTAT  Place 1 tablet (0.4 mg total) under the tongue See admin instructions. Take 1 tablet (0.4) mg by mouth every day. May take an additional 1 tablet later in the day if  chest pain.   OVER THE COUNTER MEDICATION Take 1 capsule by mouth daily at 12 noon. VisiUltra   OVER THE COUNTER MEDICATION Take 1 capsule by mouth daily at 12 noon. blood boost supplement for glucose control   pantoprazole 40 MG tablet Commonly known as: PROTONIX Take  40 mg by mouth daily.   PreserVision AREDS 2 Caps Take 1 capsule by mouth 2 (two) times daily.   PROBIOTIC PO Take 2 capsules by mouth daily at 12 noon.   QUERCETIN PO Take 1 tablet by mouth daily at 12 noon.   rosuvastatin  40 MG tablet Commonly known as: CRESTOR  Take 1 tablet (40 mg total) by mouth daily.   salicyclic acid-sulfur 2-2 % shampoo Commonly known as: SEBULEX Apply 1 Application topically daily as needed for itching.   selenium 50 MCG Tabs tablet Take 200 mcg by mouth daily.   spironolactone  25 MG tablet Commonly known as: ALDACTONE  Take 0.5 tablets (12.5 mg total) by mouth daily.   Synthroid  75 MCG tablet Generic drug: levothyroxine  Take 75 mcg by mouth daily before breakfast.   VITAMIN B12 PO Take 1 tablet by mouth daily at 12 noon. 500   VITAMIN D-3 PO Take 1 tablet by mouth daily at 12 noon. 2000       Allergies  Allergen Reactions   Tetracyclines & Related Hives and Itching   Cozaar [Losartan] Cough   Zestril  [Lisinopril ] Cough   Lipitor [Atorvastatin] Other (See Comments)    Myalgias        The results of significant diagnostics from this hospitalization (including imaging, microbiology, ancillary and laboratory) are listed below for reference.    Significant Diagnostic Studies: CARDIAC CATHETERIZATION Result Date: 02/27/2024 Conclusions: Severe native coronary artery disease, as detailed below, similar to last catheterization in 09/2023. Widely patent LIMA-LAD. Patent SVG-OM with 60% ostial, 95% mid, and 30% distal graft stenoses. Mildly elevated left ventricular filling pressure (LVEDP 20 mmHg). Successful angioplasty to ostial and mid SVG-OM, including drug-coated balloon angioplasty to the mid graft lesion with reduction in ostial stenosis from 60% -> 30% and reduction in mid graft stenosis from 95% -> 10%. Recommendations: Resume heparin  infusion 2 hours after TR band removal.  Transition back to apixaban  tomorrow morning if there is no evidence of bleeding/vascular injury.  Recommend apixaban  + clopidogrel  + aspirin  for 1 week, after which time aspirin  can be discontinued. Aggressive secondary prevention of coronary artery disease. Limited echo to reassess LVEF in the setting of NSTEMI and recurrent in-stent restenosis of SVG-OM. Lonni Hanson, MD [facility]  CT Angio Chest PE W and/or Wo Contrast Result Date: 02/25/2024 EXAM: CTA of the Chest with contrast for PE 02/25/2024 12:31:37 AM TECHNIQUE: CTA of the chest was performed after the administration of intravenous contrast. Multiplanar reformatted images are provided for review. MIP images are provided for review. Automated exposure control, iterative reconstruction, and/or weight based adjustment of the mA/kV was utilized to reduce the radiation dose to as low as reasonably achievable. COMPARISON: 12/07/2023 CLINICAL HISTORY: Syncope/presyncope, cerebrovascular cause suspected. FINDINGS: PULMONARY ARTERIES: Pulmonary arteries are adequately opacified for evaluation. No pulmonary embolism. Main pulmonary artery is normal  in caliber. MEDIASTINUM: The heart and pericardium demonstrate no acute abnormality. Aortic atherosclerosis. There is no acute abnormality of the thoracic aorta. LYMPH NODES: No mediastinal, hilar or axillary lymphadenopathy. LUNGS AND PLEURA: The lungs are without acute process. No focal consolidation or pulmonary edema. Previously seen bilateral effusions have resolved. Scarring in the lower lobes stable. No pneumothorax. UPPER ABDOMEN: Limited images of the upper abdomen are unremarkable. SOFT TISSUES AND BONES: No acute bone or soft tissue abnormality. IMPRESSION: 1. No pulmonary embolism. 2. Bibasilar scarring. 3. Aortic atherosclerosis. 4. No acute findings. Electronically signed by: Franky Crease MD 02/25/2024 12:37 AM EDT RP Workstation: [id number]   DG Chest 2 View  Result Date: 02/24/2024 CLINICAL DATA:  Chest pain for 1 week EXAM: CHEST - 2 VIEW COMPARISON:  12/07/2023 FINDINGS: Frontal and lateral views of the chest demonstrate postsurgical changes from median sternotomy and aortic valve prosthesis. Cardiac silhouette is unremarkable. Increased prominence of the pulmonary interstitium consistent with developing interstitial edema and mild volume overload. No effusions or pneumothorax. No acute bony abnormalities. IMPRESSION: 1. Increased interstitial prominence compatible with developing interstitial edema. Electronically Signed   By: Ozell Daring M.D.   On: 02/24/2024 22:29   XR HIP UNILAT W OR W/O PELVIS 2-3 VIEWS LEFT Result Date: 02/21/2024 X-rays of the left hip show a left total hip arthroplasty.  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  The femoral head is eccentrically positioned within the acetabular component.  XR Lumbar Spine 2-3 Views Result Date: 02/21/2024 X-rays of the lumbar spine show age-appropriate degenerative changes.   Microbiology: Recent Results (from the past 240 hours)  MRSA Next Gen by PCR, Nasal      Status: Abnormal   Collection Time: 02/25/24  2:47 PM   Specimen: Nasal Mucosa; Nasal Swab  Result Value Ref Range Status   MRSA by PCR Next Gen DETECTED (A) NOT DETECTED Final    Comment: RESULT CALLED TO, READ BACK BY AND VERIFIED WITH: RN AMINA M. 1101 AT 1349, ADC (NOTE) The GeneXpert MRSA Assay (FDA approved for NASAL specimens only), is one component of a comprehensive MRSA colonization surveillance program. It is not intended to diagnose MRSA infection nor to guide or monitor treatment for MRSA infections. Test performance is not FDA approved in patients less than 72 years old. Performed at [facility], [address], [place], [place] [address]      Labs: Basic Metabolic Panel: Recent Labs  Lab 02/24/24 2159 02/25/24 0435 02/27/24 1029 02/28/24 0621  NA 135 134* 136 135  K 3.8 3.9 4.0 4.6  CL 99 99 99 99  CO2 23 20* 22 23  GLUCOSE 196* 169* 129* 139*  BUN 19 20 25* 25*  CREATININE 1.38* 1.28* 1.34* 1.27*  CALCIUM  8.9 8.7* 9.1 9.1  PHOS  --   --  3.8  --    Liver Function Tests: Recent Labs  Lab 02/27/24 1029  ALBUMIN 3.0*   No results for input(s): LIPASE, AMYLASE in the last 168 hours. No results for input(s): AMMONIA in the last 168 hours. CBC: Recent Labs  Lab 02/24/24 2159 02/25/24 0435 02/26/24 0923 02/27/24 0154 02/28/24 0621  WBC 8.1 8.5 9.7 7.5 7.0  HGB 11.3* 10.8* 11.5* 11.5* 11.3*  HCT 35.7* 33.5* 34.7* 35.3* 34.4*  MCV 85.6 84.0 81.6 83.3 81.9  PLT 286 259 263 269 289   Cardiac Enzymes: No results for input(s): CKTOTAL, CKMB, CKMBINDEX, TROPONINI in the last 168 hours. BNP: BNP (last 3 results) Recent Labs    10/23/23 1730 10/29/23 1930 12/07/23 1530  BNP 873.7* 1,155.5* 559.7*    ProBNP (last 3 results) No results for input(s): PROBNP in the last 8760 hours.  CBG: No results for input(s): GLUCAP in the last 168 hours.     Signed:  Sigurd Pac MD.  [facility] 02/28/2024, 10:59  AM

## 2024-02-28 NOTE — Progress Notes (Addendum)
 Pt ambulated with MS prior to my arrival. Asymptomatic.  Pt was educated on NSTEMI, PTCA of SVG-OM1, Antiplatelet and ASA use, wt restrictions, no baths/daily wash-ups, s/s of infection, ex guidelines, s/s to stop exercising, NTG use and calling 911, heart healthy diet, risk factors and CRPII. Pt received MI book and materials on exercise, diet, and CRPII. Will refer to [facility].   Pt is unsure he is interested in CR.   Garen FORBES Candy MS, ACSM-CEP  02/28/2024 9:50 AM

## 2024-02-28 NOTE — Progress Notes (Signed)
  Progress Note  Patient Name: Luke Harvey Date of Encounter: 02/28/2024 [facility] Cardiologist: Lonni LITTIE Nanas, MD   Interval Summary   Feeling well this morning.  No recurrent chest pain or shortness of breath.  Vital Signs Vitals:   02/27/24 1900 02/27/24 2000 02/27/24 2350 02/28/24 0357  BP: 125/74 125/72 130/83 135/73  Pulse: 85 93 (!) 104 (!) 103  Resp: 14 16 17 20   Temp:  98.1 F (36.7 C) 98.9 F (37.2 C) 98.5 F (36.9 C)  TempSrc:  Oral Oral Oral  SpO2: 90% 96% 95% 90%  Weight:      Height:        Intake/Output Summary (Last 24 hours) at 02/28/2024 0817 Last data filed at 02/28/2024 0631 Gross per 24 hour  Intake 897.24 ml  Output 1400 ml  Net -502.76 ml      02/24/2024    9:49 PM 12/22/2023    1:25 PM 12/07/2023    3:29 PM  Last 3 Weights  Weight (lbs) 136 lb 0.4 oz 136 lb 133 lb 9.6 oz  Weight (kg) 61.7 kg 61.689 kg 60.6 kg      Telemetry/ECG  Sinus rhythm without significant arrhythmia- Personally Reviewed  Physical Exam  GEN: No acute distress.   Neck: No JVD Cardiac: RRR, 1/6 systolic murmur at the RUSB Respiratory: Clear to auscultation bilaterally. GI: Soft, nontender, non-distended  MS: No edema  Assessment & Plan  NSTEMI: s/p SVG-OM PCI yesterday found to have thrombotic 95% stenosis in the graft, treated with drug coated balloon angioplasty. Plan: DC heparin , resume low-dose apixaban . ASA x 1 week. Continue clopidogrel , metoprolol  succinate, and rosuvastatin . Mixed hyperlipidemia: Continue rosuvastatin  40 mg daily Aortic stenosis status post TAVR: Normal TAVR prosthetic function demonstrated on most recent echocardiogram with mean gradient 5 mmHg and trivial paravalvular regurgitation Paroxysmal atrial fibrillation: Maintaining sinus rhythm on telemetry here.  Resume apixaban  2.5 mg twice daily.  [facility] will sign off.   The patient is ready for discharge today from a cardiac standpoint. Medication  Recommendations: Apixaban  2.5 mg twice daily, clopidogrel  75 mg daily, aspirin  81 mg daily x 1 week only then stop Other recommendations (labs, testing, etc): None Follow up as an outpatient: Will arrange posthospital cardiology follow-up  For questions or updates, please contact [facility] Please consult [email or web address] for contact info under         Signed, [name], MD

## 2024-02-28 NOTE — Plan of Care (Signed)
   Problem: Activity: Goal: Risk for activity intolerance will decrease Outcome: Progressing   Problem: Nutrition: Goal: Adequate nutrition will be maintained Outcome: Progressing   Problem: Coping: Goal: Level of anxiety will decrease Outcome: Progressing   Problem: Elimination: Goal: Will not experience complications related to bowel motility Outcome: Progressing Goal: Will not experience complications related to urinary retention Outcome: Progressing

## 2024-02-29 ENCOUNTER — Telehealth: Payer: Self-pay

## 2024-02-29 ENCOUNTER — Telehealth: Payer: Self-pay | Admitting: Cardiology

## 2024-02-29 MED FILL — Lidocaine HCl Local Preservative Free (PF) Inj 1%: INTRAMUSCULAR | Qty: 30 | Status: AC

## 2024-02-29 NOTE — Transitions of Care (Post Inpatient/ED Visit) (Signed)
   02/29/2024  Name: Luke Harvey MRN: [id number] DOB: 1945/08/15  Today's TOC FU Call Status: Today's TOC FU Call Status:: Successful TOC FU Call Completed TOC FU Call Complete Date: 02/29/24 Patient's Name and Date of Birth confirmed.  Transition Care Management Follow-up Telephone Call Date of Discharge: 02/28/24 Discharge Facility: [facility]) Type of Discharge: Inpatient Admission Primary Inpatient Discharge Diagnosis:: Unstable Angina  Heart Condition per patient How have you been since you were released from the hospital?: Better Any questions or concerns?: Yes Patient Questions/Concerns:: Why did they say my medicines are changed and they're not changed? Patient Questions/Concerns Addressed: Provided Patient Educational Materials (Reviewed as much information on AVS patient has in hand and would review)  Items Reviewed: Did you receive and understand the discharge instructions provided?: Yes (Yes and no, why did they take me off of prednisone, I just a new bottle mailed to me today from the [place], the [place] is giving it to me for my lung condition) Medications obtained,verified, and reconciled?: Partial Review Completed Reason for Partial Mediation Review: Patient states, It's too many and nothing's changed except the ones they said to stop and no explaination of why they were stopped Any new allergies since your discharge?: No  Medications Reviewed Today:  Reviewed medications on AVS 02/28/24 page one for Start taking, Change how to take and Stop taking only patient declines further review. Patient encouraged to follow up with Bonni LIEN for PCP to follow up. Medications Reviewed Today   Medications were not reviewed in this encounter      Follow up appointments reviewed:  PCP Follow-up appointment confirmed?: No (Patient denies ever haing [facility] at [facility] and never seen by a PCP there) Specialist Hospital Follow-up appointment confirmed?: Yes Date  of Specialist follow-up appointment?: 03/13/24 Follow-Up Specialty Provider:: Lum Louis, Cardiology (Reviewed appointment with patient and phone number and patient verbalized understanding.)  *Patient declined further review as he states, [facility], I guess I will call them about the prednisone.  [name], RN, BSN, CCM [facility], [facility] Management Coordinator Direct Dial: [phone]

## 2024-02-29 NOTE — Telephone Encounter (Signed)
 Pt c/o medication issue:  1. Name of Medication:   Prednisone and Amoxicillin   2. How are you currently taking this medication (dosage and times per day)?   Not starting yet  3. Are you having a reaction (difficulty breathing--STAT)?   4. What is your medication issue?   Patient stated he was [facility] but was told he should not take Prednisone.  Patient wants a call back regarding these medications.

## 2024-03-01 NOTE — Telephone Encounter (Signed)
 Spoke with pt and advised we are still waiting for recommendation from pharmacy team.  Pt will be contacted as soon as we have an answer.  Confirmed with pt that hospital discharge instructions advise to discontinue the prednisone. Pt states [facility] needs to [facility] if he is unable to start this medicatiion as recommended by their clinic.

## 2024-03-01 NOTE — Telephone Encounter (Signed)
  Patient is calling back to follow up. Patient is requesting if he can call back today

## 2024-03-02 ENCOUNTER — Telehealth: Payer: Self-pay | Admitting: Orthopaedic Surgery

## 2024-03-02 NOTE — Telephone Encounter (Signed)
 Spoke with pt, he reports he is still coughing, but not as much. He was told to take the antibiotic completely.

## 2024-03-02 NOTE — Telephone Encounter (Signed)
 Patient's daughter called. She says the Dr. Sharman Dr. Jerri referred the patient to, is not [facility]. She would like to know if the referral could be sent to a different doctor?

## 2024-03-02 NOTE — Telephone Encounter (Signed)
 He should not take, unless he is still having symptoms. If he is feeling better, no need

## 2024-03-02 NOTE — Telephone Encounter (Signed)
 Is the prednisone a chronic medication? If it is, it may have been d/c by accident

## 2024-03-02 NOTE — Telephone Encounter (Signed)
 Contacted patient. Referral was placed earlier this week for another physician at [facility].

## 2024-03-02 NOTE — Telephone Encounter (Signed)
 Spoke to patient he stated he does not take Prednisone everyday any longer.He was prescribed Prednisone to take for 5 days and Amoxicillin  for 10 days for a lung infection.He received medication this past Tuesday but he has not started taking yet.He wanted to make sure ok to take.I will send message back to our pharmacist.

## 2024-03-05 ENCOUNTER — Telehealth (HOSPITAL_COMMUNITY): Payer: Self-pay

## 2024-03-05 NOTE — Telephone Encounter (Signed)
 Attempted to call patient in regards to Cardiac Rehab - LM on VM

## 2024-03-06 ENCOUNTER — Telehealth (HOSPITAL_COMMUNITY): Payer: Self-pay

## 2024-03-06 NOTE — Telephone Encounter (Signed)
Pt returned phone call and stated that he is not interested in the cardiac rehab program. Closed referral.

## 2024-03-09 ENCOUNTER — Encounter: Payer: Self-pay | Admitting: *Deleted

## 2024-03-13 ENCOUNTER — Ambulatory Visit: Admitting: Emergency Medicine

## 2024-03-13 NOTE — Progress Notes (Deleted)
 Cardiology Office Note:    Date:  03/13/2024  ID:  FRANCK VINAL, DOB 1946-02-14, MRN [id number] PCP: [facility] Providers Cardiologist:  Lonni LITTIE Nanas, MD Structural Heart:  [name], MD{ Click to update primary MD,subspecialty MD or APP then REFRESH:1}    {Click to Open Review  :1}   Patient Profile:       Chief Complaint: *** History of Present Illness:  Luke Harvey is a 78 y.o. male with visit-pertinent history of severe aortic stenosis s/p TAVR, paroxysmal atrial fibrillation, type 2 diabetes, NSTEMI, hyperlipidemia, CKD, SVT, HFrEF, coronary artery disease s/p CABG x 2 in 2014, multiple PCI's, ICM  Underwent CABG in 2014 with LIMA to LAD and SVG to LCx.  In 2015 he underwent stenting of his RCA.  In 2018 he underwent PCI/DES for in-stent restenosis of his RCA in the setting of NSTEMI.  He was admitted in 03/2020 with unstable angina and underwent DES to SVG, OM x 3, and staged CSI orbital arthrectomy shockwave IVUS lithotripsy to proximal-mid RCA and shockwave lithotripsy and POBA to distal RCA.  He had accelerating chest pain 2023.  Cath showed patent LIMA to LAD, SVG to OM with 50% ostial and 25% proximal in-stent restenosis.  There was 95% stenosis at the proximal marginal and mid graft stent s/p PCI/DES x 1 to SVG to OM overlapping previously placed stent.  He was admitted on 6/29 2025 for NSTEMI.  Catheterization showed severe native LCA disease with occluded LCx and LAD, patent RCA stents, patent LIMA to LAD, SVG-OM and 70% stenosis s/p successful PCI of vein graft with 2 overlapping stents.  Echocardiogram 10/2023 showed LVEF 25 to 30%, RWMA's, grade 2 DD, moderate to severe MAC with mild to moderate MR/TR, moderate AI and severe LFLG aortic stenosis with mean gradient 17.7 mmHg, [name] 0.93 cm.  The regional wall motion abnormalities were not felt to correlate with his angiographic findings and felt possibly indicating aortic  stenosis as the cause of his cardiomyopathy.  Outpatient TAVR consult was recommended and set up for 7/23.  However he presented back to the hospital on 10/29/2023 with worsening exertional shortness of breath.  He was ultimately admitted for acute CHF with EF of 25% and severe AS.  He required IV diuresis and pleurocentesis. S/p TAVR with a 23 Edwards Sapien 3 Ultra Resilia via the TF approach by Dr. Fell and Shyrl on 11/04/23. Follow-up echocardiogram 7/12 showed 23 mm edwards sapien 3 Ultra Resilia THV, no perivalvular leak with mean gradient of 11 mmHg and peak gradient 19 mmHg. Diagnosed with new onset PAF and started on Eliquis  2.5mg  BID.   He was seen in the ED on 12/08/2023 with A-fib with RVR.  He self converted in the ER and sent home.  He was recently admitted on 02/25/2024 with NSTEMI.  He underwent cardiac catheterization showing patent SVG to OM with 60% ostial, 95% mid, and 30% distal graft stenosis with successful angioplasty to ostial and mid SVG-OM including DES to the mid graft lesion with reduction of ostial stenosis from 60% to 30% and reduction of mid graft stenosis from 95% to 10%.  Limited echocardiogram showed LVEF 20 to 25%.  He was to resume low-dose Eliquis , clopidogrel , then aspirin  x 1 week.  Discussed the use of AI scribe software for clinical note transcription with the patient, who gave verbal consent to proceed.  History of Present Illness     Review of systems:  Please see the  history of present illness. All other systems are reviewed and otherwise negative. ***      Studies Reviewed:        ***  Risk Assessment/Calculations:   {Does this patient have ATRIAL FIBRILLATION?:[phone]} No BP recorded.  {Refresh Note OR Click here to enter BP  :1}***        Physical Exam:   VS:  There were no vitals taken for this visit.   Wt Readings from Last 3 Encounters:  02/24/24 136 lb 0.4 oz (61.7 kg)  12/22/23 136 lb (61.7 kg)  12/07/23 133 lb 9.6 oz (60.6  kg)    GEN: Well nourished, well developed in no acute distress NECK: No JVD; No carotid bruits CARDIAC: ***RRR, no murmurs, rubs, gallops RESPIRATORY:  Clear to auscultation without rales, wheezing or rhonchi  ABDOMEN: Soft, non-tender, non-distended EXTREMITIES:  No edema; No acute deformity ***      Assessment and Plan:    Assessment and Plan Assessment & Plan      {Are you ordering a CV Procedure (e.g. stress test, cath, DCCV, TEE, etc)?   Press F2        :[id number]}  Dispo:  No follow-ups on file.  Signed, Lum LITTIE Louis, NP

## 2024-03-14 ENCOUNTER — Telehealth (HOSPITAL_BASED_OUTPATIENT_CLINIC_OR_DEPARTMENT_OTHER): Payer: Self-pay

## 2024-03-14 NOTE — Telephone Encounter (Signed)
   Pre-operative Risk Assessment    Patient Name: Luke Harvey  DOB: May 31, 1945 MRN: [id number]   Date of last office visit: 12/22/23 with Sebastian   Date of next office visit: 11/08/24 with Sebastian   Request for Surgical Clearance    Procedure:  Cataract Extraction by PE, IOL- right  Date of Surgery:  Clearance 03/29/24                                 Surgeon:  Dr. Regenia Socks Group or Practice Name:  [facility]  Phone number:  [phone] Fax number:  [phone]   Type of Clearance Requested:   - Medical  - Pharmacy:  Hold Clopidogrel  (Plavix ) and Apixaban  (Eliquis ) not indicated   Type of Anesthesia:  IV Sedation    Additional requests/questions:    Bonney Augustin JONETTA Delores   03/14/2024, 11:00 AM

## 2024-03-14 NOTE — Telephone Encounter (Signed)
   Patient Name: LENARD KAMPF  DOB: Jun 03, 1945 MRN: [id number]  Primary Cardiologist: Lonni LITTIE Nanas, MD  Chart reviewed as part of pre-operative protocol coverage. Cataract extractions are recognized in guidelines as low risk surgeries that do not typically require specific preoperative testing or holding of blood thinner therapy. Therefore, given past medical history and time since last visit, based on ACC/AHA guidelines, LUKKA BLACK would be at acceptable risk for the planned procedure without further cardiovascular testing.   I will route this recommendation to the requesting party via Epic fax function and remove from pre-op pool.  Please call with questions.  Lamarr Satterfield, NP 03/14/2024, 11:03 AM

## 2024-11-08 ENCOUNTER — Other Ambulatory Visit (HOSPITAL_COMMUNITY)

## 2024-11-08 ENCOUNTER — Ambulatory Visit: Admitting: Physician Assistant
